# Patient Record
Sex: Male | Born: 1975 | Race: White | Hispanic: No | Marital: Single | Smoking: Former smoker
Health system: Southern US, Community
[De-identification: ages and names within clinical notes are randomized; demographics above are authoritative.]

## PROBLEM LIST (undated history)

## (undated) DIAGNOSIS — G40219 Localization-related (focal) (partial) symptomatic epilepsy and epileptic syndromes with complex partial seizures, intractable, without status epilepticus: Principal | ICD-10-CM

## (undated) DIAGNOSIS — I6200 Nontraumatic subdural hemorrhage, unspecified: Principal | ICD-10-CM

## (undated) DIAGNOSIS — C719 Malignant neoplasm of brain, unspecified: Secondary | ICD-10-CM

## (undated) DIAGNOSIS — R569 Unspecified convulsions: Secondary | ICD-10-CM

## (undated) DIAGNOSIS — G40909 Epilepsy, unspecified, not intractable, without status epilepticus: Secondary | ICD-10-CM

## (undated) HISTORY — PX: CRANIOTOMY: SHX93

## (undated) HISTORY — DX: Malignant neoplasm of brain, unspecified: C71.9

---

## 2006-11-14 DIAGNOSIS — C719 Malignant neoplasm of brain, unspecified: Secondary | ICD-10-CM

## 2006-11-14 HISTORY — DX: Malignant neoplasm of brain, unspecified: C71.9

## 2009-06-17 NOTE — Procedures (Addendum)
[place] [facility]   [address]   [place], [place] [address]      Name: ERYX, ZANE Date: 06/12/2009  MR #: [id number] Sex: Judie Petit  Billing [id number] Age: 33  #:  Ref MD: Location:  DOB: 1976-03-25 Date of Adm: 06/12/2009       ROUTINE ELECTROENCEPHALOGRAM (EEG) REPORT      REQUESTING PHYSICIAN: Dariya Gainer L. Zaryan Yakubov, MD    INDICATIONS: An EEG is requested on this 33 year old man with localization  ________ epilepsy to evaluate for epileptiform abnormalities. His  medications include Keppra.    DESCRIPTION: This tracing is obtained during the awake state. 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    During periods of wakefulness, there are intermittent runs of posteriorly  dominant, symmetrical, low-to-medium amplitude, well-regulated,  10-cycle/second activities, which attenuation with eye opening. Lower  voltage, faster frequency activities are seen symmetrically over the  anterior head regions.    Hyperventilation is performed for 3 minutes with good effort and little  alters the tracing.    Intermittent photic stimulation little alters the tracing.    Sleep is not attained.    INTERPRETATION: This EEG recorded during the awake state is normal. No  epileptiform abnormalities are seen; however, this does not exclude the  possibility of seizures and/or epilepsy.    E-Signed By  Stanford Scotland, MD 08/12/2009 14:37  Stanford Scotland, MD    cc: Stanford Scotland, MD        SLE/WMX; D: 06/16/2009 5:55 P; T: 06/17/2009 11:20 A; DOC# [id number]; JOB#  [id number]

## 2009-06-17 NOTE — Procedures (Signed)
Procedures filed by Stanford Scotland, MD at 08/12/09 1437                Author: Stanford Scotland, MD  Service: --  Author Type: Physician       Filed: 08/12/09 1437  Date of Service: 06/17/09 1120  Status: Addendum          Editor: Stanford Scotland, MD (Physician)            Procedure Orders        1. EEG [[id number]] ordered by  at 06/17/09 1120                         <!--EPICS-->                   [place] [facility]<BR>                         [address]<BR>                             [place], [place] [address]<BR> <BR> <BR> Name:    Carl Howell, Carl Howell                   Date:          06/12/2009<BR> MR #:    [id number]                        Sex:           M<BR> Billing  [id number]                     Age:           33<BR> #:<BR>  Ref MD:                                   Location:<BR> DOB:     1976-10-11                       Date of Adm:   06/12/2009<BR> <BR> <BR>                  ROUTINE ELECTROENCEPHALOGRAM (EEG) REPORT<BR> <BR> <BR> REQUESTING PHYSICIAN:  Riven Beebe L. Mykalah Saari, MD<BR>  <BR> INDICATIONS:  An EEG is requested on this 33 year old man with localization<BR> ________ epilepsy to evaluate for epileptiform abnormalities. His<BR> medications include Keppra.<BR> <BR> DESCRIPTION:  This tracing is obtained during the awake state.  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 Lower<BR> voltage, faster frequency activities are seen symmetrically over the<BR> anterior head regions.<BR> <BR> Hyperventilation is performed for 3 minutes  with good effort and little<BR> alters the tracing.<BR> <BR> Intermittent photic stimulation little alters the tracing.<BR> <BR> Sleep is not  attained.<BR> <BR> INTERPRETATION:  This EEG recorded during the awake state is normal. No<BR> epileptiform abnormalities  are seen; however, this does not exclude the<BR> possibility of seizures and/or epilepsy.<BR> <BR> E-Signed By<BR> Stanford Scotland, MD 08/12/2009 14:37<BR> Britton Perkinson L Graysin Luczynski, MD<BR> <BR> cc:   Madelynn Malson L Brittni Hult, MD<BR> <BR> <BR> <BR>  SLE/WMX; D:  06/16/2009  5:55  P; T:  06/17/2009 11:20 A; DOC# [id number]; JOB#<BR> [id number]<BJ> <!--EPICE-->

## 2010-05-28 MED ORDER — LACOSAMIDE 100 MG TAB
100 mg | ORAL_TABLET | ORAL | Status: DC
Start: 2010-05-28 — End: 2010-08-28

## 2010-05-28 NOTE — Telephone Encounter (Signed)
Patient last here in March returns for f/u in september

## 2010-06-18 MED ORDER — LEVETIRACETAM 500 MG TAB
500 mg | ORAL_TABLET | ORAL | Status: DC
Start: 2010-06-18 — End: 2011-06-13

## 2010-08-30 MED ORDER — LACOSAMIDE 100 MG TAB
100 mg | ORAL_TABLET | ORAL | Status: DC
Start: 2010-08-30 — End: 2011-02-25

## 2011-02-26 MED ORDER — VIMPAT 100 MG TABLET
100 mg | ORAL_TABLET | ORAL | Status: DC
Start: 2011-02-26 — End: 2011-03-04

## 2011-03-01 NOTE — Telephone Encounter (Signed)
Needs his Vimpat 100mg  refilled said he has been trying since last week he is out and had a seizure last night, the pharmacy # is [phone] [facility].Please call in regards and let him know if tis is done.

## 2011-03-01 NOTE — Telephone Encounter (Signed)
Called patient to advise that Rx was faxed this morning. Patient voiced understanding.

## 2011-03-02 NOTE — Telephone Encounter (Signed)
Needs a note for his employer to return back to work not sure if Dr. Jettie Pagan can just write it or does he have to be seen.

## 2011-03-02 NOTE — Telephone Encounter (Signed)
Did you take this patient out of work? Patient last seen March of 2011 and has a f/u in May

## 2011-03-02 NOTE — Telephone Encounter (Signed)
No CC records as seen prior to live date.  Need to review paper chart

## 2011-03-03 NOTE — Telephone Encounter (Signed)
On your desk

## 2011-03-03 NOTE — Telephone Encounter (Signed)
Needs a note written to return to work, left a message on thursday and had not heard back so he wanted to put another one in, please call him.

## 2011-03-03 NOTE — Telephone Encounter (Signed)
Called and scheduled patient for f/u as Dr. Jettie Pagan advised. Patient coming in tomorrow morning to see [place].

## 2011-03-04 MED ORDER — LACOSAMIDE 100 MG TAB
100 mg | ORAL_TABLET | Freq: Two times a day (BID) | ORAL | Status: DC
Start: 2011-03-04 — End: 2011-03-30

## 2011-03-04 NOTE — Progress Notes (Signed)
Date:  March 04, 2011    Name:  [facility] Carl Howell  DOB:  04-04-1976  MRN:  [id number]     PCP:  No primary provider on file.    Chief Complaint   Patient presents with   ??? Follow-up     work excuse letter; had a seizure on monday at work and hit his head; needs to be cleared to go back to work   ??? Other     ran out of vimpat over the weekend and had the seizure that monday; thinks it may have been due to lack of medication     HISTORY OF PRESENT ILLNESS: Carl Howell presents today for follow up of seizures. He had a seizure while at work this week which resulted in a fall and hitting his head. Over the weekend before this occurred, he was not able to get the Vimpat refilled due to authorization issues and ran out.     Current Outpatient Prescriptions   Medication Sig   ??? VIMPAT 100 mg Tab tablet TAKE ONE TABLET BY MOUTH TWICE DAILY   ??? levetiracetam (KEPPRA) 500 mg tablet TAKE 4 TABLETS BY MOUTH EVERY MORNING , AND 3 TABLETS EVERY EVENING     No Known Allergies  Past Medical History   Diagnosis Date   ??? Epilepsy unknown     Past Surgical History   Procedure Date   ??? Kidney dialysate delivry sys      kidney surgery - unknown   ??? Brain avm surg,dural,complx      brain surgery - unknown had brain tumor     History     Social History   ??? Marital Status: Married     Spouse Name: N/A     Number of Children: N/A   ??? Years of Education: N/A     Occupational History   ??? Not on file.     Social History Main Topics   ??? Smoking status: Current Everyday Smoker -- 0.5 packs/day     Types: Cigarettes   ??? Smokeless tobacco: Never Used   ??? Alcohol Use: No   ??? Drug Use: Not on file   ??? Sexually Active: Not on file     Other Topics Concern   ??? Not on file     Social History Narrative   ??? No narrative on file     No family history on file.    PHYSICAL EXAMINATION:    Visit Vitals   Item Reading   ??? BP 132/80   ??? Pulse 81   ??? Temp 98.4 ??F (36.9 ??C)   ??? Ht 5\' 8"  (1.727 m)   ??? Wt 142 lb 3.2 oz (64.501 kg)   ??? BMI 21.62 kg/m2   ??? SpO2 99%      General:  Well defined, nourished, and groomed individual in no acute distress.    Neck: Supple, nontender, no bruits, no pain with resistance to active range of motion.    Heart: Regular rate and rhythm, no murmurs, rub, or gallop.  Normal S1S2.  Lungs:  Clear to auscultation bilaterally with equal chest expansion, no cough, no wheeze  Musculoskeletal:  Extremities revealed no edema and had full range of motion of joints.    Psych:  Good mood and bright affect    NEUROLOGICAL EXAMINATION:     Mental Status:   Alert and oriented to person, place, and time with recent and remote memory intact.  Attention span and concentration are normal. Speech is  fluent with a full fund of knowledge.      Cranial Nerves:    II, III, IV, VI:  Visual acuity grossly intact. Visual fields are normal.    Pupils are equal, round, and reactive to light and accommodation.    Extra-ocular movements are full and fluid.  Fundoscopic exam was benign, no ptosis or nystagmus.   V-XII: Hearing is grossly intact.  Facial features are symmetric, with normal sensation and strength.  The palate rises symmetrically and the tongue protrudes midline.  Sternocleidomastoids 5/5.      Motor Examination: Normal tone, bulk, and strength, 5/5 muscle strength throughout.     Coordination:  No resting or intention tremor    Gait and Station:  Steady while walking.  Normal arm swing.  No pronator drift.   No muscle wasting or fasiculations noted.      Reflexes:  DTRs 2+ throughout.        ASSESSMENT AND PLAN  1. Epilepsy  lacosamide (VIMPAT) 100 mg Tab tablet      Carl Howell appears to be stable despite this most recent hiccup which seems most likely related to running out of his medication. As long as he is compliant with therapy and does not run out of medication again, I do not feel that he is at high risk for additional seizures. He was provided with a note releasing him back to work. He was provided with prescription refills. Follow up in one year or sooner if needed.    Carl Howell, ACNP

## 2011-03-30 ENCOUNTER — Encounter

## 2011-03-30 MED ORDER — LACOSAMIDE 100 MG TAB
100 mg | ORAL_TABLET | Freq: Two times a day (BID) | ORAL | Status: DC
Start: 2011-03-30 — End: 2012-12-06

## 2011-05-03 MED ORDER — LACOSAMIDE 50 MG TAB
50 mg | ORAL_TABLET | ORAL | Status: DC
Start: 2011-05-03 — End: 2012-12-06

## 2011-05-03 NOTE — Telephone Encounter (Signed)
Dr. Jettie Pagan put him on vimpat and he has been on it a little over a year and a half now. He does not have insurance and was told by Diannia Ruder that he can pick up samples as needed. I will leave him some samples up front as he has no insurance and is waiting on his new coverage

## 2011-05-26 NOTE — Telephone Encounter (Signed)
Placed call to patient to advise, per Dr. Jettie Pagan that samples are not available at this time, but we may provide with a [employer] patient assistance form for him to fill out and submit to [employer] along with requested information. Left message on voice mail informing that he may pick up form tomorrow.

## 2011-05-26 NOTE — Telephone Encounter (Signed)
May give but have him complete pt assistance from [employer] as we do not have samples in excess to continue to supply forever

## 2011-05-26 NOTE — Telephone Encounter (Signed)
Pt called requesting samples of vimpat. Please call [phone]

## 2011-06-13 MED ORDER — LEVETIRACETAM 500 MG TAB
500 mg | ORAL_TABLET | ORAL | Status: DC
Start: 2011-06-13 — End: 2012-02-14

## 2011-06-13 NOTE — Telephone Encounter (Signed)
Patient called to find out the status of his refill on Keppra 500 mg.  Patient advised that he is totally out of this medication and he called [facility] this morning and asked that they fax a refill request.  Please give him a call back @ [phone] if you have any questions.  Thank you!

## 2012-02-14 MED ORDER — LEVETIRACETAM 500 MG TAB
500 mg | ORAL_TABLET | ORAL | Status: DC
Start: 2012-02-14 — End: 2012-05-29

## 2012-02-14 NOTE — Telephone Encounter (Signed)
Patient last seen 02/2011 by NP. No upcoming appointments.

## 2012-05-29 MED ORDER — LEVETIRACETAM 500 MG TAB
500 mg | ORAL_TABLET | ORAL | Status: DC
Start: 2012-05-29 — End: 2012-11-15

## 2012-11-15 MED ORDER — LEVETIRACETAM 500 MG TAB
500 mg | ORAL_TABLET | ORAL | Status: DC
Start: 2012-11-15 — End: 2013-01-07

## 2012-11-15 NOTE — Telephone Encounter (Signed)
Last seen 03/04/11. Next apt 12/06/12

## 2012-12-06 LAB — METABOLIC PANEL, COMPREHENSIVE
A-G Ratio: 1.2 (ref 1.1–2.2)
ALT (SGPT): 20 U/L (ref 12–78)
AST (SGOT): 10 U/L — ABNORMAL LOW (ref 15–37)
Albumin: 4.2 g/dL (ref 3.5–5.0)
Alk. phosphatase: 89 U/L (ref 50–136)
Anion gap: 11 mmol/L (ref 5–15)
BUN/Creatinine ratio: 13 (ref 12–20)
BUN: 9 MG/DL (ref 6–20)
Bilirubin, total: 0.4 MG/DL (ref 0.2–1.0)
CO2: 27 MMOL/L (ref 21–32)
Calcium: 9 MG/DL (ref 8.5–10.1)
Chloride: 103 MMOL/L (ref 97–108)
Creatinine: 0.71 MG/DL (ref 0.45–1.15)
GFR est AA: >60 mL/min/{1.73_m2} (ref >60)
GFR est non-AA: >60 mL/min/{1.73_m2} (ref >60)
Globulin: 3.6 g/dL (ref 2.0–4.0)
Glucose: 82 MG/DL (ref 65–100)
Potassium: 3.5 MMOL/L (ref 3.5–5.1)
Protein, total: 7.8 g/dL (ref 6.4–8.2)
Sodium: 141 MMOL/L (ref 136–145)

## 2012-12-06 LAB — CBC WITH AUTOMATED DIFF
ABS. BASOPHILS: 0 10*3/uL (ref 0.0–0.1)
ABS. EOSINOPHILS: 0.1 10*3/uL (ref 0.0–0.4)
ABS. LYMPHOCYTES: 1.9 10*3/uL
ABS. MONOCYTES: 0.6 10*3/uL (ref 0.0–1.0)
ABS. NEUTROPHILS: 7.6 10*3/uL (ref 1.8–8.0)
BASOPHILS: 0 % (ref 0–1)
EOSINOPHILS: 1 % (ref 0–7)
HCT: 40 % (ref 36.6–50.3)
HGB: 13.9 g/dL (ref 12.1–17.0)
LYMPHOCYTES: 18 % (ref 12–49)
MCH: 32.1 PG (ref 26.0–34.0)
MCHC: 34.8 g/dL (ref 30.0–36.5)
MCV: 92.4 FL (ref 80.0–99.0)
MONOCYTES: 6 % (ref 5–13)
NEUTROPHILS: 75 % (ref 32–75)
PLATELET: 302 10*3/uL (ref 150–400)
RBC: 4.33 M/uL (ref 4.10–5.70)
RDW: 11.8 % (ref 11.5–14.5)
WBC: 10.1 10*3/uL (ref 4.1–11.1)

## 2012-12-06 LAB — GLUCOSE, POC: Glucose (POC): 88 mg/dL (ref 75–110)

## 2012-12-06 MED ORDER — SODIUM CHLORIDE 0.9% BOLUS IV
0.9 % | Freq: Once | INTRAVENOUS | Status: AC
Start: 2012-12-06 — End: 2012-12-06
  Administered 2012-12-06: 18:00:00 via INTRAVENOUS

## 2012-12-06 MED ORDER — LACOSAMIDE 100 MG TAB
100 mg | ORAL_TABLET | Freq: Two times a day (BID) | ORAL | Status: DC
Start: 2012-12-06 — End: 2013-08-05

## 2012-12-06 MED ORDER — LACOSAMIDE 100 MG TAB
100 mg | ORAL | Status: DC
Start: 2012-12-06 — End: 2012-12-06

## 2012-12-06 MED FILL — SODIUM CHLORIDE 0.9 % IV: INTRAVENOUS | Qty: 1000

## 2012-12-06 NOTE — ED Notes (Signed)
Patient states he started to feel lightheaded and this is how he normally feels before he has a seizure. Patient has a history of seizures from a brain tumor in the past. Patient's friend that brought him in states the patient "froze up in the car and he would not move or talk for about a minute minute and a half." Patient was nauseous but states "I feel fine now."

## 2012-12-06 NOTE — ED Notes (Signed)
Vimpat not stocked in our pyxis. MD aware. Will give patient a prescription to get filled.

## 2012-12-06 NOTE — ED Provider Notes (Signed)
HPI Comments: 37 year old white male with prior history of seizure disorder, benign brain tumor with resection in 1998 at [facility] presents with a coworker concerned that he was about to have a seizure. Apparently he was in the car and a coworker noticed him to have a blank stare and poorly responding to. The patient was aware of this and states he felt some nausea and lightheadedness as if he was going to have a full blown seizure. The symptoms last less than a minute but he was brought to the ER. The patient states he is compliant with his medications. He denies any current headache nausea vomiting. He does state he has had extra stress and in fact was up late the last 2 nights. He admits to social alcohol use but denies any tobacco use or illicit drug use. His last seizure was in March and there's been no change in his Keppra. He denies any over-the-counter medications.    Patient is a 37 y.o. male presenting with seizures and dizziness.   Seizure   Pertinent negatives include no headaches, no speech difficulty, no chest pain and no cough.   He reports no chest pain, no headaches, no speech difficulty and no cough.   Dizziness  Pertinent negatives include no speech difficulty. Pertinent negatives include no chest pain and no headaches. Associated medical issues include seizures.        Past Medical History   Diagnosis Date   ??? Epilepsy unknown        Past Surgical History   Procedure Laterality Date   ??? Kidney dialysate delivry sys       kidney surgery - unknown   ??? Pr brain avm surg,dural,complx       brain surgery - unknown had brain tumor         History reviewed. No pertinent family history.     History     Social History   ??? Marital Status: MARRIED     Spouse Name: N/A     Number of Children: N/A   ??? Years of Education: N/A     Occupational History   ??? Not on file.     Social History Main Topics   ??? Smoking status: Current Every Day Smoker -- 0.50 packs/day     Types: Cigarettes   ??? Smokeless tobacco:  Never Used   ??? Alcohol Use: No   ??? Drug Use: Not on file   ??? Sexually Active: Not on file     Other Topics Concern   ??? Not on file     Social History Narrative   ??? No narrative on file                  ALLERGIES: Review of patient's allergies indicates no known allergies.      Review of Systems   Constitutional: Negative for fever and chills.   HENT: Negative for neck pain and neck stiffness.    Eyes: Negative for pain.   Respiratory: Negative for cough and chest tightness.    Cardiovascular: Negative for chest pain and leg swelling.   Genitourinary: Negative for flank pain.   Neurological: Positive for dizziness. Negative for seizures, speech difficulty, weakness and headaches.   All other systems reviewed and are negative.        Filed Vitals:    12/06/12 1303 12/06/12 1304   BP: 136/77    Pulse: 117    Temp: 97.7 ??F (36.5 ??C)    Resp: 18  Height: 5\' 8"  (1.727 m) 5\' 8"  (1.727 m)   Weight: 68.04 kg (150 lb) 68.04 kg (150 lb)   SpO2: 99%             Physical Exam   Nursing note and vitals reviewed.  Constitutional: He is oriented to person, place, and time. He appears well-developed and well-nourished. No distress.   HENT:   Head: Normocephalic and atraumatic.   Eyes: Conjunctivae and EOM are normal. Pupils are equal, round, and reactive to light.   Neck: Normal range of motion. Neck supple.   Cardiovascular: Normal rate, regular rhythm, normal heart sounds and intact distal pulses.  Exam reveals no gallop and no friction rub.    No murmur heard.  Pulmonary/Chest: Effort normal and breath sounds normal. No respiratory distress. He has no wheezes. He has no rales. He exhibits no tenderness.   Abdominal: Soft. Bowel sounds are normal. He exhibits no distension and no mass. There is no tenderness. There is no rebound and no guarding.   Musculoskeletal: Normal range of motion. He exhibits no edema and no tenderness.   Neurological: He is alert and oriented to person, place, and time. He has normal strength and normal  reflexes. He displays no tremor and normal reflexes. No cranial nerve deficit. He exhibits normal muscle tone. He displays no seizure activity. Coordination and gait normal. GCS eye subscore is 4. GCS verbal subscore is 5. GCS motor subscore is 6.   Skin: Skin is warm. No rash noted. No erythema. No pallor.        MDM     Differential Diagnosis; Clinical Impression; Plan:     Differential diagnosis-soft therapeutic medications, Primary seizure, malignancySleep deprivation. Clinical impression/plan-the patient presents after having a near seizure event. He is supposed to be on Keppra as well as VIMPAT. He stopped taking the VIMPAT several months ago as his neurologist did not refill it he thought this then he should not take it any longer. In ER he is otherwise nonfocal. His case was discussed with his neurologist he states he should be on that medication and we will restart that. He will followup with his neurologist in 24-48 hours.  Amount and/or Complexity of Data Reviewed:   Clinical lab tests:  Ordered and reviewed  Tests in the medicine section of the CPT??:  Ordered and reviewed  Discussion of test results with the performing providers:  No   Decide to obtain previous medical records or to obtain history from someone other than the patient:  Yes   Obtain history from someone other than the patient:  Yes   Review and summarize past medical records:  Yes   Discuss the patient with another provider:  Yes   Independant visualization of image, tracing, or specimen:  Yes  Risk of Significant Complications, Morbidity, and/or Mortality:   Presenting problems:  Moderate  Diagnostic procedures:  Low  Management options:  Low  Progress:   Patient progress:  Stable and improved      Procedures    2:24 PM Progress note-patient is feeling well desires to go. Discussed this case with Dr. Jettie Pagan his neurologist.There must have been miscommunication regarding his Vimpat as the patient thought he was not supposed to be taking it  any longer as it was not refilled with the neurologist states he should be on it. He wants him to stay with her same dose of Keppra and restart the Vimpat 100mg  BID.

## 2012-12-06 NOTE — ED Notes (Signed)
Seizure pads placed on patient's stretcher.

## 2012-12-06 NOTE — ED Notes (Signed)
Discharge instructions and prescriptions given to patient by MD. Patient verbalized understanding and has no further questions at this time. Patient stable at time of discharge. IV removed. Patient's vital signs stable. Offered wheelchair to discharge patient but patient denies at this time. Patient ambulatory to ED lobby to go home with friend who is picking him up.

## 2012-12-07 LAB — EKG, 12 LEAD, INITIAL
Atrial Rate: 106 {beats}/min
Calculated P Axis: 82 degrees
Calculated R Axis: 80 degrees
Calculated T Axis: 76 degrees
P-R Interval: 186 ms
Q-T Interval: 338 ms
QRS Duration: 96 ms
QTC Calculation (Bezet): 448 ms
Ventricular Rate: 106 {beats}/min

## 2012-12-07 NOTE — Telephone Encounter (Signed)
Per Dr Jettie Pagan, have patient see Leonette Monarch, NP. Called patient and left message with appointment for today at [facility] @ 11:30am.

## 2012-12-08 LAB — LEVETIRACETAM (KEPPRA): Levetiracetam (Keppra): 23.8 ug/mL (ref 5.0–63.0)

## 2013-01-07 NOTE — Telephone Encounter (Signed)
Last seen 02/2011.

## 2013-01-08 MED ORDER — LEVETIRACETAM 500 MG TAB
500 mg | ORAL_TABLET | ORAL | Status: DC
Start: 2013-01-08 — End: 2013-02-19

## 2013-01-08 NOTE — Telephone Encounter (Signed)
Need annual office visit. Will not refill until seen in office.

## 2013-02-19 MED ORDER — LEVETIRACETAM 500 MG TAB
500 mg | ORAL_TABLET | ORAL | Status: DC
Start: 2013-02-19 — End: 2013-03-29

## 2013-03-29 MED ORDER — LEVETIRACETAM 500 MG TAB
500 mg | ORAL_TABLET | ORAL | Status: DC
Start: 2013-03-29 — End: 2013-05-09

## 2013-05-10 MED ORDER — LEVETIRACETAM 500 MG TAB
500 mg | ORAL_TABLET | ORAL | Status: DC
Start: 2013-05-10 — End: 2013-06-19

## 2013-06-19 MED ORDER — LEVETIRACETAM 500 MG TAB
500 mg | ORAL_TABLET | ORAL | Status: DC
Start: 2013-06-19 — End: 2013-07-31

## 2013-07-31 MED ORDER — LEVETIRACETAM 500 MG TAB
500 mg | ORAL_TABLET | ORAL | Status: DC
Start: 2013-07-31 — End: 2013-08-05

## 2013-08-02 NOTE — Progress Notes (Signed)
Not seen in 2 years

## 2013-08-05 MED ORDER — LEVETIRACETAM 500 MG TAB
500 mg | ORAL_TABLET | ORAL | Status: DC
Start: 2013-08-05 — End: 2016-08-02

## 2013-08-05 MED ORDER — LACOSAMIDE 100 MG TAB
100 mg | ORAL_TABLET | Freq: Two times a day (BID) | ORAL | Status: DC
Start: 2013-08-05 — End: 2016-08-02

## 2013-08-05 NOTE — Progress Notes (Signed)
Date:  August 05, 2013    Name:  Carl Howell  DOB:  Apr 03, 1976  MRN:  [id number]     PCP:  No primary provider on file.    Chief Complaint   Patient presents with   ??? 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 Had a near miss in January that landed him in the ER. Taken there by a friend and he felt as though he might have a seizure. No actual seizure activity. He had run out of the Vimpat and thought that because the refill request was denied that he no longer needed it. While in the ED, the Vimpat was restarted. He continues to take Keppra 1500/2000mg . No new complaints. Still has some intermittent left lower extremity muscle spasm but nothing that seems to cause any balance or functional issues. No new medical or surgical history.     Review of Systems - History obtained from the patient  General ROS: negative  Psychological ROS: negative  ENT ROS: negative  Hematological and Lymphatic ROS: negative  Endocrine ROS: negative  Respiratory ROS: no cough, shortness of breath, or wheezing  Cardiovascular ROS: no chest pain or dyspnea on exertion  Gastrointestinal ROS: no abdominal pain, change in bowel habits, or black or bloody stools  Genito-Urinary ROS: no dysuria, trouble voiding, or hematuria  Musculoskeletal ROS: negative  Neurological ROS: no TIA or stroke symptoms  negative for - seizures  Dermatological ROS: negative     Current Outpatient Prescriptions   Medication Sig   ??? levetiracetam (KEPPRA) 500 mg tablet TAKE 4 TABLETS BY MOUTH IN THE MORNING AND 3 TABLETS IN THE EVENING. **MUST SEE MD FOR REFILLS**   ??? lacosamide (VIMPAT) 100 mg tab tablet Take 1 Tab by mouth two (2) times a day.     No current facility-administered medications for this visit.     No Known Allergies  Past Medical History   Diagnosis Date   ??? Epilepsy unknown     Past Surgical History   Procedure  Laterality Date   ??? Kidney dialysate delivry sys       kidney surgery - unknown   ??? Pr brain avm surg,dural,complx       brain surgery - unknown had brain tumor     History     Social History   ??? Marital Status: MARRIED     Spouse Name: N/A     Number of Children: N/A   ??? Years of Education: N/A     Occupational History   ??? Not on file.     Social History Main Topics   ??? Smoking status: Current Every Day Smoker -- 0.50 packs/day     Types: Cigarettes   ??? Smokeless tobacco: Never Used   ??? Alcohol Use: No   ??? Drug Use: Not on file   ??? Sexually Active: Not on file     Other Topics Concern   ??? Not on file     Social History Narrative   ??? No narrative on file     History reviewed. No pertinent family history.    PHYSICAL EXAMINATION:    Visit Vitals   Item Reading   ??? BP 116/72   ??? Pulse 92   ??? Ht 5\' 8"  (1.727 m)   ??? Wt 71.668 kg (158 lb)   ??? BMI 24.03 kg/m2   ???  SpO2 97%     General:  Well defined, nourished, and groomed individual in no acute distress.    Neck: Supple, nontender, no bruits, no pain with resistance to active range of motion.    Heart: Regular rate and rhythm, no murmurs, rub, or gallop.  Normal S1S2.  Lungs:  Clear to auscultation bilaterally with equal chest expansion, no cough, no wheeze  Musculoskeletal:  Extremities revealed no edema and had full range of motion of joints.    Psych:  Good mood and bright affect    NEUROLOGICAL EXAMINATION:     Mental Status:   Alert and oriented to person, place, and time   Cranial Nerves:    II, III, IV, VI:  Visual acuity grossly intact. Visual fields are normal.    Pupils are equal, round, and reactive to light and accommodation.    Extra-ocular movements are full and fluid.  Fundoscopic exam was benign, no ptosis or nystagmus.   V-XII: Hearing is grossly intact.  Facial features are symmetric, with normal sensation and strength.  The palate rises symmetrically and the tongue protrudes midline.  Sternocleidomastoids 5/5.    Motor Examination: Normal tone, bulk,  and strength, 5/5 muscle strength throughout.    Coordination:  Finger to nose was normal.   No resting or intention tremor  Gait and Station:  Steady while walking.  Normal arm swing.  No pronator drift.   No muscle wasting or fasiculations noted.    Reflexes:  DTRs 2+ throughout.     ASSESSMENT AND PLAN    ICD-9-CM    1. Epilepsy 345.90 levetiracetam (KEPPRA) 500 mg tablet     lacosamide (VIMPAT) 100 mg tab tablet     No new neurological symptoms to suggest any new brain pathology. Seizures are well controlled on current therapy. No new complaints. Continue present medications, Keppra same generic for each refill and Vimpat as previously prescribed. Discussed refills were denied as a result of not being seen in almost two years rather than no longer needing medication. Stressed importance of yearly follow up. DMV forms filled out.     Carl Howell, ACNP

## 2013-08-05 NOTE — Progress Notes (Signed)
Patient present today in follow up for epilepsy. Went to [facility] in January for evaluation after almost losing consciousness. No other episodes since then.

## 2013-09-11 NOTE — Telephone Encounter (Signed)
I was able to help the caller, no message taken.

## 2014-05-09 LAB — CBC WITH AUTOMATED DIFF
ABS. BASOPHILS: 0 10*3/uL (ref 0.0–0.1)
ABS. EOSINOPHILS: 0 10*3/uL (ref 0.0–0.4)
ABS. LYMPHOCYTES: 1.6 10*3/uL (ref 0.8–3.5)
ABS. MONOCYTES: 0.8 10*3/uL (ref 0.0–1.0)
ABS. NEUTROPHILS: 13.4 10*3/uL — ABNORMAL HIGH (ref 1.8–8.0)
BASOPHILS: 0 % (ref 0–1)
EOSINOPHILS: 0 % (ref 0–7)
HCT: 38.8 % (ref 36.6–50.3)
HGB: 13.2 g/dL (ref 12.1–17.0)
LYMPHOCYTES: 10 % — ABNORMAL LOW (ref 12–49)
MCH: 30.9 PG (ref 26.0–34.0)
MCHC: 34 g/dL (ref 30.0–36.5)
MCV: 90.9 FL (ref 80.0–99.0)
MONOCYTES: 5 % (ref 5–13)
NEUTROPHILS: 85 % — ABNORMAL HIGH (ref 32–75)
PLATELET: 280 10*3/uL (ref 150–400)
RBC: 4.27 M/uL (ref 4.10–5.70)
RDW: 12.4 % (ref 11.5–14.5)
WBC: 15.8 10*3/uL — ABNORMAL HIGH (ref 4.1–11.1)

## 2014-05-09 LAB — METABOLIC PANEL, COMPREHENSIVE
A-G Ratio: 1.3 (ref 1.1–2.2)
ALT (SGPT): 22 U/L (ref 12–78)
AST (SGOT): 12 U/L — ABNORMAL LOW (ref 15–37)
Albumin: 4.5 g/dL (ref 3.5–5.0)
Alk. phosphatase: 72 U/L (ref 45–117)
Anion gap: 12 mmol/L (ref 5–15)
BUN/Creatinine ratio: 10 — ABNORMAL LOW (ref 12–20)
BUN: 8 MG/DL (ref 6–20)
Bilirubin, total: 0.7 MG/DL (ref 0.2–1.0)
CO2: 24 mmol/L (ref 21–32)
Calcium: 9 MG/DL (ref 8.5–10.1)
Chloride: 105 mmol/L (ref 97–108)
Creatinine: 0.84 MG/DL (ref 0.45–1.15)
GFR est AA: >60 mL/min/{1.73_m2} (ref >60)
GFR est non-AA: >60 mL/min/{1.73_m2} (ref >60)
Globulin: 3.4 g/dL (ref 2.0–4.0)
Glucose: 119 mg/dL — ABNORMAL HIGH (ref 65–100)
Potassium: 3 mmol/L — ABNORMAL LOW (ref 3.5–5.1)
Protein, total: 7.9 g/dL (ref 6.4–8.2)
Sodium: 141 mmol/L (ref 136–145)

## 2014-05-09 LAB — URINALYSIS W/ RFLX MICROSCOPIC
Bacteria: NEGATIVE /hpf
Bilirubin: NEGATIVE
Blood: NEGATIVE
Glucose: NEGATIVE mg/dL
Ketone: NEGATIVE mg/dL
Leukocyte Esterase: NEGATIVE
Nitrites: NEGATIVE
Protein: 30 mg/dL — AB
Specific gravity: 1.02 (ref 1.003–1.030)
Urobilinogen: 0.2 EU/dL (ref 0.2–1.0)
pH (UA): 8.5 — ABNORMAL HIGH (ref 5.0–8.0)

## 2014-05-09 LAB — MAGNESIUM: Magnesium: 1.9 mg/dL (ref 1.6–2.4)

## 2014-05-09 LAB — LIPASE: Lipase: 91 U/L (ref 73–393)

## 2014-05-09 MED ORDER — ONDANSETRON (PF) 4 MG/2 ML INJECTION
4 mg/2 mL | INTRAMUSCULAR | Status: AC
Start: 2014-05-09 — End: 2014-05-09
  Administered 2014-05-09: 18:00:00 via INTRAVENOUS

## 2014-05-09 MED ORDER — ONDANSETRON 4 MG TAB, RAPID DISSOLVE
4 mg | ORAL_TABLET | Freq: Three times a day (TID) | ORAL | Status: DC | PRN
Start: 2014-05-09 — End: 2015-04-16

## 2014-05-09 MED ORDER — POTASSIUM CHLORIDE SR 10 MEQ TAB
10 mEq | ORAL | Status: AC
Start: 2014-05-09 — End: 2014-05-09
  Administered 2014-05-09: 19:00:00 via ORAL

## 2014-05-09 MED ORDER — SODIUM CHLORIDE 0.9% BOLUS IV
0.9 % | INTRAVENOUS | Status: AC
Start: 2014-05-09 — End: 2014-05-09
  Administered 2014-05-09: 18:00:00 via INTRAVENOUS

## 2014-05-09 MED FILL — SODIUM CHLORIDE 0.9 % IV: INTRAVENOUS | Qty: 1000

## 2014-05-09 MED FILL — K-TAB 10 MEQ TABLET,EXTENDED RELEASE: 10 mEq | ORAL | Qty: 4

## 2014-05-09 MED FILL — ONDANSETRON (PF) 4 MG/2 ML INJECTION: 4 mg/2 mL | INTRAMUSCULAR | Qty: 2

## 2014-05-09 NOTE — ED Notes (Signed)
Dr. Ashwell gave and reviewed discharge instructions with the patient.  The patient verbalized understanding.  The patient was given opportunity for questions.  Patient discharged in stable condition to the waiting room ambulatory with male visitor.

## 2014-05-09 NOTE — ED Provider Notes (Signed)
HPI Comments: 38 yo male presents ambulatory to ED for evaluation of minimal diffuse abd pain, nausea, vomiting, and diarrhea x this morning. Pt cites 11 episodes of vomiting and states he cannot tolerate liquids. Pt reports he very rarely drinks alcohol and cites consuming 4 beers x last night. Pt notes hx of brain CA with recent surgery but denies currently receiving chemo or radiation. Pt denies recent travel or exposure to sick persons. Pt denies blood in stool or vomit, dysuria, headache, testicle pain, fever, or cough.    PMHx: epilepsy  PSHx: brain surgery  Social Hx: +tobacco, +EtOH (very rare)     There are no other complaints, changes or physical findings at this time.   Written by Dannielle Huh, ED Scribe, as dictated by Ocie Cornfield, MD.     The history is provided by the patient.        Past Medical History   Diagnosis Date   ??? Epilepsy ([facility]) unknown        Past Surgical History   Procedure Laterality Date   ??? Kidney dialysate delivry sys       kidney surgery - unknown   ??? Pr brain avm surg,dural,complx       brain surgery - unknown had brain tumor         No family history on file.     History     Social History   ??? Marital Status: MARRIED     Spouse Name: N/A     Number of Children: N/A   ??? Years of Education: N/A     Occupational History   ??? Not on file.     Social History Main Topics   ??? Smoking status: Current Every Day Smoker -- 0.50 packs/day     Types: Cigarettes   ??? Smokeless tobacco: Never Used   ??? Alcohol Use: No   ??? Drug Use: Not on file   ??? Sexual Activity: Not on file     Other Topics Concern   ??? Not on file     Social History Narrative   ??? No narrative on file                  ALLERGIES: Review of patient's allergies indicates no known allergies.      Review of Systems   Constitutional: Negative for fever, chills and fatigue.   HENT: Negative for congestion, rhinorrhea and sore throat.    Eyes: Negative for pain, discharge and visual disturbance.    Respiratory: Negative for cough, chest tightness, shortness of breath and wheezing.    Cardiovascular: Negative for chest pain, palpitations and leg swelling.   Gastrointestinal: Positive for nausea, vomiting, abdominal pain and diarrhea. Negative for constipation.   Genitourinary: Negative for dysuria, frequency and hematuria.   Musculoskeletal: Negative for myalgias, back pain and arthralgias.   Skin: Negative for rash.   Neurological: Negative for dizziness, weakness, light-headedness and headaches.   Psychiatric/Behavioral: Negative.    All other systems reviewed and are negative.      Filed Vitals:    05/09/14 1258   BP: 158/96   Pulse: 71   Temp: 97.9 ??F (36.6 ??C)   Resp: 18   Height: 5' 8"  (1.727 m)   Weight: 64.7 kg (142 lb 10.2 oz)   SpO2: 100%            Physical Exam   Constitutional: He is oriented to person, place, and time. He appears well-developed and well-nourished. No distress.  HENT:   Head: Normocephalic and atraumatic.   Eyes: EOM are normal. Right eye exhibits no discharge. Left eye exhibits no discharge. No scleral icterus.   Neck: Normal range of motion. Neck supple. No tracheal deviation present.   Cardiovascular: Normal rate, regular rhythm, normal heart sounds and intact distal pulses.  Exam reveals no gallop and no friction rub.    No murmur heard.  Pulmonary/Chest: Effort normal and breath sounds normal. No respiratory distress. He has no wheezes. He has no rales.   Abdominal: Soft. He exhibits no distension. There is tenderness (mild, diffuse). There is no rebound and no guarding.   Musculoskeletal: Normal range of motion. He exhibits no edema.   Lymphadenopathy:     He has no cervical adenopathy.   Neurological: He is alert and oriented to person, place, and time.   Skin: Skin is warm and dry. No rash noted.   Well healed incision to scalp   Psychiatric: He has a normal mood and affect.   Written by Dannielle Huh, ED Scribe, as dictated by Ocie Cornfield, MD.     MDM   Number of Diagnoses or Management Options  Gastroenteritis, acute:   Diagnosis management comments:     Patient appears well on exam, nontoxic.  Differential includes dehydration, viral syndrome, gastroenteritis, UTI, electrolyte abnormality.  Abdominal exam relatively benign - doubt acute intraabdominal process.  - CBC, CMP, lipase  - UA  - Symptomatic management and reevaluate        Procedures    PROGRESS NOTE:  3:15 PM  Patient reports he is feeling much better and would like to go home.  Abdominal exam is benign, NTTP.  Tolerating po.  Leukocytosis noted on labs but work up otherwise unremarkable.  Will discharge with PCP follow up.  Discussed results, prescriptions and follow up plan with patient.  Provided customary return to ED instructions.  Patient expressed understanding.  Rip Harbour, MD     LABORATORY TESTS:  Recent Results (from the past 12 hour(s))   CBC WITH AUTOMATED DIFF    Collection Time: 05/09/14  1:42 PM   Result Value Ref Range    WBC 15.8 (H) 4.1 - 11.1 K/uL    RBC 4.27 4.10 - 5.70 M/uL    HGB 13.2 12.1 - 17.0 g/dL    HCT 38.8 36.6 - 50.3 %    MCV 90.9 80.0 - 99.0 FL    MCH 30.9 26.0 - 34.0 PG    MCHC 34.0 30.0 - 36.5 g/dL    RDW 12.4 11.5 - 14.5 %    PLATELET 280 150 - 400 K/uL    NEUTROPHILS 85 (H) 32 - 75 %    LYMPHOCYTES 10 (L) 12 - 49 %    MONOCYTES 5 5 - 13 %    EOSINOPHILS 0 0 - 7 %    BASOPHILS 0 0 - 1 %    ABS. NEUTROPHILS 13.4 (H) 1.8 - 8.0 K/UL    ABS. LYMPHOCYTES 1.6 0.8 - 3.5 K/UL    ABS. MONOCYTES 0.8 0.0 - 1.0 K/UL    ABS. EOSINOPHILS 0.0 0.0 - 0.4 K/UL    ABS. BASOPHILS 0.0 0.0 - 0.1 K/UL   METABOLIC PANEL, COMPREHENSIVE    Collection Time: 05/09/14  1:42 PM   Result Value Ref Range    Sodium 141 136 - 145 mmol/L    Potassium 3.0 (L) 3.5 - 5.1 mmol/L    Chloride 105 97 - 108 mmol/L    CO2 24  21 - 32 mmol/L    Anion gap 12 5 - 15 mmol/L    Glucose 119 (H) 65 - 100 mg/dL    BUN 8 6 - 20 MG/DL    Creatinine 0.84 0.45 - 1.15 MG/DL    BUN/Creatinine ratio 10 (L) 12 - 20       GFR est AA >60 >60 ml/min/1.9m    GFR est non-AA >60 >60 ml/min/1.718m   Calcium 9.0 8.5 - 10.1 MG/DL    Bilirubin, total 0.7 0.2 - 1.0 MG/DL    ALT 22 12 - 78 U/L    AST 12 (L) 15 - 37 U/L    Alk. phosphatase 72 45 - 117 U/L    Protein, total 7.9 6.4 - 8.2 g/dL    Albumin 4.5 3.5 - 5.0 g/dL    Globulin 3.4 2.0 - 4.0 g/dL    A-G Ratio 1.3 1.1 - 2.2     LIPASE    Collection Time: 05/09/14  1:42 PM   Result Value Ref Range    Lipase 91 73 - 393 U/L   MAGNESIUM    Collection Time: 05/09/14  1:42 PM   Result Value Ref Range    Magnesium 1.9 1.6 - 2.4 mg/dL   URINALYSIS W/ RFLX MICROSCOPIC    Collection Time: 05/09/14  2:48 PM   Result Value Ref Range    Color YELLOW/STRAW      Appearance CLEAR CLEAR      Specific gravity 1.020 1.003 - 1.030      pH (UA) 8.5 (H) 5.0 - 8.0      Protein 30 (A) NEG mg/dL    Glucose NEGATIVE  NEG mg/dL    Ketone NEGATIVE  NEG mg/dL    Bilirubin NEGATIVE  NEG      Blood NEGATIVE  NEG      Urobilinogen 0.2 0.2 - 1.0 EU/dL    Nitrites NEGATIVE  NEG      Leukocyte Esterase NEGATIVE  NEG      WBC 0-4 0 - 4 /hpf    RBC 0-5 0 - 5 /hpf    Epithelial cells FEW FEW /lpf    Bacteria NEGATIVE  NEG /hpf         MEDICATIONS GIVEN:  Medications   sodium chloride 0.9 % bolus infusion 1,000 mL (0 mL IntraVENous IV Completed 05/09/14 1447)   ondansetron (ZOFRAN) injection 4 mg (4 mg IntraVENous Given 05/09/14 1347)   potassium chloride SR (KLOR-CON 10) tablet 40 mEq (40 mEq Oral Given 05/09/14 1454)       IMPRESSION:  1. Gastroenteritis, acute        PLAN:  1. rx'd zofran  2. F/u with PCP  Return to ED if worse       3:21 PM   Pt has been reexamined. Pt has no new complaints, changes or physical findings. Care plan outlined and precautions discussed. All available results were reviewed with pt. All medications were reviewed with pt.  All of pt's questions and concerns were addressed. Pt agrees to F/U as instructed with PCP and agrees to return to ED upon further deterioration. Pt is ready to go home.   Written by ChDannielle HuhED Scribe, as dictated by KaOcie CornfieldMD.

## 2014-05-29 LAB — CBC WITH AUTOMATED DIFF
ABS. BASOPHILS: 0 10*3/uL (ref 0.0–0.1)
ABS. EOSINOPHILS: 0 10*3/uL (ref 0.0–0.4)
ABS. LYMPHOCYTES: 1.5 10*3/uL (ref 0.8–3.5)
ABS. MONOCYTES: 1.2 10*3/uL — ABNORMAL HIGH (ref 0.0–1.0)
ABS. NEUTROPHILS: 13.7 10*3/uL — ABNORMAL HIGH (ref 1.8–8.0)
BASOPHILS: 0 % (ref 0–1)
EOSINOPHILS: 0 % (ref 0–7)
HCT: 45.4 % (ref 36.6–50.3)
HGB: 15.5 g/dL (ref 12.1–17.0)
LYMPHOCYTES: 9 % — ABNORMAL LOW (ref 12–49)
MCH: 31.9 PG (ref 26.0–34.0)
MCHC: 34.1 g/dL (ref 30.0–36.5)
MCV: 93.4 FL (ref 80.0–99.0)
MONOCYTES: 7 % (ref 5–13)
NEUTROPHILS: 84 % — ABNORMAL HIGH (ref 32–75)
PLATELET: 433 10*3/uL — ABNORMAL HIGH (ref 150–400)
RBC: 4.86 M/uL (ref 4.10–5.70)
RDW: 12.6 % (ref 11.5–14.5)
WBC: 16.4 10*3/uL — ABNORMAL HIGH (ref 4.1–11.1)

## 2014-05-29 LAB — METABOLIC PANEL, COMPREHENSIVE
A-G Ratio: 1.2 (ref 1.1–2.2)
ALT (SGPT): 54 U/L (ref 12–78)
AST (SGOT): 18 U/L (ref 15–37)
Albumin: 5 g/dL (ref 3.5–5.0)
Alk. phosphatase: 79 U/L (ref 45–117)
Anion gap: 13 mmol/L (ref 5–15)
BUN/Creatinine ratio: 11 — ABNORMAL LOW (ref 12–20)
BUN: 15 MG/DL (ref 6–20)
Bilirubin, total: 1 MG/DL (ref 0.2–1.0)
CO2: 22 mmol/L (ref 21–32)
Calcium: 10.7 MG/DL — ABNORMAL HIGH (ref 8.5–10.1)
Chloride: 105 mmol/L (ref 97–108)
Creatinine: 1.33 MG/DL — ABNORMAL HIGH (ref 0.45–1.15)
GFR est AA: >60 mL/min/{1.73_m2} (ref >60)
GFR est non-AA: >60 mL/min/{1.73_m2} (ref >60)
Globulin: 4.3 g/dL — ABNORMAL HIGH (ref 2.0–4.0)
Glucose: 149 mg/dL — ABNORMAL HIGH (ref 65–100)
Potassium: 4.1 mmol/L (ref 3.5–5.1)
Protein, total: 9.3 g/dL — ABNORMAL HIGH (ref 6.4–8.2)
Sodium: 140 mmol/L (ref 136–145)

## 2014-05-29 LAB — LACTIC ACID: Lactic acid: 3.3 MMOL/L — ABNORMAL HIGH (ref 0.4–2.0)

## 2014-05-29 NOTE — ED Notes (Signed)
Attempted to call patient again still with no response. Checked bathrooms and outside with no response.

## 2014-05-29 NOTE — ED Notes (Signed)
Called patient to come back to exam room at this time with no answer, bathroom and outside checked with no answer. Will check back.

## 2014-06-01 NOTE — ED Provider Notes (Signed)
Patient is a 38 y.o. male presenting with vomiting and back pain.   Vomiting     Back Pain          Past Medical History   Diagnosis Date   ??? Epilepsy ([facility]) unknown        Past Surgical History   Procedure Laterality Date   ??? Kidney dialysate delivry sys       kidney surgery - unknown   ??? Pr brain avm surg,dural,complx       brain surgery - unknown had brain tumor         No family history on file.     History     Social History   ??? Marital Status: MARRIED     Spouse Name: N/A     Number of Children: N/A   ??? Years of Education: N/A     Occupational History   ??? Not on file.     Social History Main Topics   ??? Smoking status: Current Every Day Smoker -- 0.50 packs/day     Types: Cigarettes   ??? Smokeless tobacco: Never Used   ??? Alcohol Use: No   ??? Drug Use: Not on file   ??? Sexual Activity: Not on file     Other Topics Concern   ??? Not on file     Social History Narrative   ??? No narrative on file                  ALLERGIES: Review of patient's allergies indicates no known allergies.      Review of Systems   Gastrointestinal: Positive for vomiting.   Musculoskeletal: Positive for back pain.       Filed Vitals:    05/29/14 1826   BP: 163/101   Pulse: 89   Temp: 97.9 ??F (36.6 ??C)   Resp: 18   Height: 5\' 8"  (1.727 m)   Weight: 63.1 kg (139 lb 1.8 oz)   SpO2: 98%            Physical Exam     MDM    Procedures    11:57 AM    I was inadvertently assigned to this patient's treatment team.  I did not see this patient nor did I have any contact with this patient.  I had no involvement during the evaluation, treatment or disposition of this patient.  I am signing off this note to indicate only why my name appeared in the record.  Mickel Crow, MD

## 2014-11-01 ENCOUNTER — Inpatient Hospital Stay
Admit: 2014-11-01 | Discharge: 2014-11-01 | Disposition: A | Discharge status: Home / Self Care | Payer: MEDICAID | Attending: Emergency Medicine

## 2014-11-01 DIAGNOSIS — Z5111 Encounter for antineoplastic chemotherapy: Secondary | ICD-10-CM

## 2014-11-01 LAB — MAGNESIUM: Magnesium: 2.2 mg/dL (ref 1.6–2.4)

## 2014-11-01 LAB — METABOLIC PANEL, COMPREHENSIVE
A-G Ratio: 1.4 (ref 1.1–2.2)
ALT (SGPT): 45 U/L (ref 12–78)
AST (SGOT): 20 U/L (ref 15–37)
Albumin: 5.1 g/dL — ABNORMAL HIGH (ref 3.5–5.0)
Alk. phosphatase: 71 U/L (ref 45–117)
Anion gap: 10 mmol/L (ref 5–15)
BUN/Creatinine ratio: 16 (ref 12–20)
BUN: 25 MG/DL — ABNORMAL HIGH (ref 6–20)
Bilirubin, total: 2.1 MG/DL — ABNORMAL HIGH (ref 0.2–1.0)
CO2: 29 mmol/L (ref 21–32)
Calcium: 9.8 MG/DL (ref 8.5–10.1)
Chloride: 95 mmol/L — ABNORMAL LOW (ref 97–108)
Creatinine: 1.52 MG/DL — ABNORMAL HIGH (ref 0.70–1.30)
GFR est AA: >60 mL/min/{1.73_m2} (ref >60)
GFR est non-AA: 52 mL/min/{1.73_m2} — ABNORMAL LOW (ref >60)
Globulin: 3.7 g/dL (ref 2.0–4.0)
Glucose: 153 mg/dL — ABNORMAL HIGH (ref 65–100)
Potassium: 2.9 mmol/L — ABNORMAL LOW (ref 3.5–5.1)
Protein, total: 8.8 g/dL — ABNORMAL HIGH (ref 6.4–8.2)
Sodium: 134 mmol/L — ABNORMAL LOW (ref 136–145)

## 2014-11-01 LAB — CBC WITH AUTOMATED DIFF
ABS. BASOPHILS: 0 10*3/uL (ref 0.0–0.1)
ABS. EOSINOPHILS: 0 10*3/uL (ref 0.0–0.4)
ABS. LYMPHOCYTES: 1 10*3/uL (ref 0.8–3.5)
ABS. MONOCYTES: 1 10*3/uL (ref 0.0–1.0)
ABS. NEUTROPHILS: 14.8 10*3/uL — ABNORMAL HIGH (ref 1.8–8.0)
BASOPHILS: 0 % (ref 0–1)
EOSINOPHILS: 0 % (ref 0–7)
HCT: 43.2 % (ref 36.6–50.3)
HGB: 15.5 g/dL (ref 12.1–17.0)
LYMPHOCYTES: 6 % — ABNORMAL LOW (ref 12–49)
MCH: 32.5 PG (ref 26.0–34.0)
MCHC: 35.9 g/dL (ref 30.0–36.5)
MCV: 90.6 FL (ref 80.0–99.0)
MONOCYTES: 6 % (ref 5–13)
NEUTROPHILS: 88 % — ABNORMAL HIGH (ref 32–75)
PLATELET: 343 10*3/uL (ref 150–400)
RBC: 4.77 M/uL (ref 4.10–5.70)
RDW: 12.4 % (ref 11.5–14.5)
WBC: 16.8 10*3/uL — ABNORMAL HIGH (ref 4.1–11.1)

## 2014-11-01 LAB — LIPASE: Lipase: 115 U/L (ref 73–393)

## 2014-11-01 MED ORDER — SODIUM CHLORIDE 0.9% BOLUS IV
0.9 % | INTRAVENOUS | Status: AC
Start: 2014-11-01 — End: 2014-11-01
  Administered 2014-11-01: 21:00:00 via INTRAVENOUS

## 2014-11-01 MED ORDER — PROCHLORPERAZINE EDISYLATE 5 MG/ML INJECTION
5 mg/mL | INTRAMUSCULAR | Status: AC
Start: 2014-11-01 — End: 2014-11-01
  Administered 2014-11-01: 21:00:00 via INTRAVENOUS

## 2014-11-01 MED FILL — PROCHLORPERAZINE EDISYLATE 5 MG/ML INJECTION: 5 mg/mL | INTRAMUSCULAR | Qty: 2

## 2014-11-01 MED FILL — SODIUM CHLORIDE 0.9 % IV: INTRAVENOUS | Qty: 1000

## 2014-11-01 NOTE — ED Notes (Signed)
Tolerating PO without nausea or vomiting. Reports feeling "ready to go home." Dr. Daleen BoSalley aware.

## 2014-11-01 NOTE — ED Notes (Signed)
Dr. Daleen BoSalley at bedside to provide discharge paperwork. Vital signs stable. Pt in no apparent distress at this time. Mental status at baseline. Ambulatory to waiting room with steady gate, discharge paperwork in hand. Accompanied by his mother.

## 2014-11-01 NOTE — ED Provider Notes (Signed)
HPI Comments: Carl Howell is a 38 y.o. Male who has stage 3 brain cancer and presents [facility] ED ambulatory with cc several episodes of nausea and vomiting and associated centralized abd pain x 6days. Pt also reports one episode of diarrhea x2days but no such sxs since then. Pt states he takes oral chemo medication at home. Pt also states that his chemo medication dose was recently increased in the last 2-3 weeks and that nausea is a common sxs he experiences when his doses are increased. Pt states he took Zofran via both sublingual and oral forms with no sxs relief however the pt does note that taking hot baths temporarily relieves his sxs. Pt notes he has had two surgery's, one in '08 and the other in may '15 [facility] to remove brain tumors. Pt specifically denies any sxs of difficulty urinating.          Neurosurgeon: Dr. Trinna PostScott Graham ([facility])      PMHx is significant for: Epilepsy  PSHx is significant for: Kidney dialysate delivery sys, Brain Surgery  Social Hx: +Tobacco(.5ppd), -EtOH, -Illicit        There are no other complaints changes or physical findings at this time  Written by Beatriz ChancellorGregory Robinson, ED Scribe as dictated by Brigid ReJohn J Salley, MD  .                        The history is provided by the patient.        Past Medical History:   Diagnosis Date   ??? Epilepsy ([facility]) unknown       Past Surgical History:   Procedure Laterality Date   ??? Kidney dialysate delivry sys       kidney surgery - unknown   ??? Pr brain avm surg,dural,complx       brain surgery - unknown had brain tumor         No family history on file.    History     Social History   ??? Marital Status: MARRIED     Spouse Name: N/A     Number of Children: N/A   ??? Years of Education: N/A     Occupational History   ??? Not on file.     Social History Main Topics   ??? Smoking status: Current Every Day Smoker -- 0.50 packs/day     Types: Cigarettes   ??? Smokeless tobacco: Never Used   ??? Alcohol Use: No   ??? Drug Use: Not on file    ??? Sexual Activity: Not on file     Other Topics Concern   ??? Not on file     Social History Narrative   ??? No narrative on file                ALLERGIES: Review of patient's allergies indicates no known allergies.      Review of Systems   Constitutional: Negative for fever, chills and appetite change.   HENT: Negative for congestion.    Eyes: Negative for visual disturbance.   Respiratory: Negative for cough, shortness of breath and wheezing.    Cardiovascular: Negative for chest pain, palpitations and leg swelling.   Gastrointestinal: Positive for nausea, vomiting, abdominal pain and diarrhea.   Genitourinary: Negative for dysuria, urgency, frequency and difficulty urinating.   Musculoskeletal: Negative for myalgias, back pain, joint swelling and neck stiffness.   Skin: Negative for rash.   Neurological: Negative for dizziness, syncope, weakness and  headaches.   Hematological: Negative for adenopathy.   Psychiatric/Behavioral: Negative for behavioral problems and dysphoric mood.       Filed Vitals:    11/01/14 1440   BP: 155/102   Pulse: 109   Temp: 97.3 ??F (36.3 ??C)   Resp: 16   Height: 5\' 8"  (1.727 m)   Weight: 70.3 kg (154 lb 15.7 oz)   SpO2: 100%            Physical Exam   Constitutional: He is oriented to person, place, and time. He appears well-developed and well-nourished. No distress.   HENT:   Head: Normocephalic and atraumatic.   Mouth/Throat: Oropharynx is clear and moist.   Eyes: Conjunctivae and EOM are normal. Pupils are equal, round, and reactive to light. No scleral icterus.   Neck: Normal range of motion. Neck supple.   Cardiovascular: Normal rate and regular rhythm.  Exam reveals no gallop.    No murmur heard.  Pulmonary/Chest: Effort normal. No stridor. No respiratory distress. He has no wheezes. He has no rales.   Abdominal: Soft. Bowel sounds are normal. He exhibits no distension and no mass. There is no tenderness. There is no rebound and no guarding.    Musculoskeletal: Normal range of motion. He exhibits no edema.   Lymphadenopathy:     He has no cervical adenopathy.   Neurological: He is alert and oriented to person, place, and time. No cranial nerve deficit. Coordination normal.   Skin: Skin is warm and dry. No rash noted. No erythema.   Psychiatric: He has a normal mood and affect.   Nursing note and vitals reviewed.   Written by Beatriz ChancellorGregory Robinson, ED Scribe as dictated by Brigid ReJohn J Salley, MD              MDM  Number of Diagnoses or Management Options  Diagnosis management comments: DDx: Dehydration, Renal dysfunction, Chemo induced vomiting       Amount and/or Complexity of Data Reviewed  Clinical lab tests: ordered and reviewed  Review and summarize past medical records: yes    Patient Progress  Patient progress: stable      Procedures    6:17 PM  Much improved after fluid and antiemetics. Tolerating PO. Will discharge home.  Julianne HandlerJohn J Salley, Jr, MD

## 2014-11-01 NOTE — ED Notes (Signed)
Ambulatory to restroom, steady gait noted.

## 2014-11-01 NOTE — ED Notes (Signed)
Assumed care of patient. Patient placed in position of comfort. Call bell in reach. Skin warm and dry. Respirations even and unlabored. In no apparent distress at this time. Pt presents ambulatory into the ED with c/o nausea, vomiting and diffuse abdominal discomfort-currently receiving chemotherapy. No further symptoms reported at this time. Pt's mother remains at bedside. Will continue to monitor.

## 2015-04-16 ENCOUNTER — Inpatient Hospital Stay
Admit: 2015-04-16 | Discharge: 2015-04-17 | Disposition: A | Discharge status: Home / Self Care | Payer: MEDICAID | Attending: Emergency Medicine

## 2015-04-16 DIAGNOSIS — R112 Nausea with vomiting, unspecified: Secondary | ICD-10-CM

## 2015-04-16 LAB — METABOLIC PANEL, COMPREHENSIVE
A-G Ratio: 1.5 (ref 1.1–2.2)
ALT (SGPT): 32 U/L (ref 12–78)
AST (SGOT): 21 U/L (ref 15–37)
Albumin: 5.1 g/dL — ABNORMAL HIGH (ref 3.5–5.0)
Alk. phosphatase: 67 U/L (ref 45–117)
Anion gap: 14 mmol/L (ref 5–15)
BUN/Creatinine ratio: 18 (ref 12–20)
BUN: 31 MG/DL — ABNORMAL HIGH (ref 6–20)
Bilirubin, total: 1.3 MG/DL — ABNORMAL HIGH (ref 0.2–1.0)
CO2: 24 mmol/L (ref 21–32)
Calcium: 9.6 MG/DL (ref 8.5–10.1)
Chloride: 95 mmol/L — ABNORMAL LOW (ref 97–108)
Creatinine: 1.76 MG/DL — ABNORMAL HIGH (ref 0.70–1.30)
GFR est AA: 53 mL/min/{1.73_m2} — ABNORMAL LOW (ref >60)
GFR est non-AA: 44 mL/min/{1.73_m2} — ABNORMAL LOW (ref >60)
Globulin: 3.5 g/dL (ref 2.0–4.0)
Glucose: 112 mg/dL — ABNORMAL HIGH (ref 65–100)
Potassium: 2.6 mmol/L — CL (ref 3.5–5.1)
Protein, total: 8.6 g/dL — ABNORMAL HIGH (ref 6.4–8.2)
Sodium: 133 mmol/L — ABNORMAL LOW (ref 136–145)

## 2015-04-16 LAB — CBC WITH AUTOMATED DIFF
ABS. BASOPHILS: 0 10*3/uL (ref 0.0–0.1)
ABS. EOSINOPHILS: 0 10*3/uL (ref 0.0–0.4)
ABS. LYMPHOCYTES: 1.6 10*3/uL (ref 0.8–3.5)
ABS. MONOCYTES: 1.4 10*3/uL — ABNORMAL HIGH (ref 0.0–1.0)
ABS. NEUTROPHILS: 12 10*3/uL — ABNORMAL HIGH (ref 1.8–8.0)
BASOPHILS: 0 % (ref 0–1)
EOSINOPHILS: 0 % (ref 0–7)
HCT: 39.3 % (ref 36.6–50.3)
HGB: 14.2 g/dL (ref 12.1–17.0)
LYMPHOCYTES: 11 % — ABNORMAL LOW (ref 12–49)
MCH: 32.5 PG (ref 26.0–34.0)
MCHC: 36.1 g/dL (ref 30.0–36.5)
MCV: 89.9 FL (ref 80.0–99.0)
MONOCYTES: 9 % (ref 5–13)
NEUTROPHILS: 80 % — ABNORMAL HIGH (ref 32–75)
PLATELET: 305 10*3/uL (ref 150–400)
RBC: 4.37 M/uL (ref 4.10–5.70)
RDW: 12 % (ref 11.5–14.5)
WBC: 15.1 10*3/uL — ABNORMAL HIGH (ref 4.1–11.1)

## 2015-04-16 LAB — MAGNESIUM: Magnesium: 1.9 mg/dL (ref 1.6–2.4)

## 2015-04-16 LAB — LIPASE: Lipase: 77 U/L (ref 73–393)

## 2015-04-16 MED ORDER — ONDANSETRON 4 MG TAB, RAPID DISSOLVE
4 mg | ORAL_TABLET | Freq: Three times a day (TID) | ORAL | Status: DC | PRN
Start: 2015-04-16 — End: 2015-04-16

## 2015-04-16 MED ORDER — ONDANSETRON 4 MG TAB, RAPID DISSOLVE
4 mg | ORAL_TABLET | Freq: Three times a day (TID) | ORAL | Status: DC | PRN
Start: 2015-04-16 — End: 2016-08-03

## 2015-04-16 MED ORDER — POTASSIUM CHLORIDE 10 MEQ/100 ML IV PIGGY BACK
10 mEq/0 mL | INTRAVENOUS | Status: AC
Start: 2015-04-16 — End: 2015-04-16
  Administered 2015-04-16: 22:00:00 via INTRAVENOUS

## 2015-04-16 MED ORDER — POTASSIUM CHLORIDE SR 20 MEQ TAB, PARTICLES/CRYSTALS
20 mEq | ORAL | Status: AC
Start: 2015-04-16 — End: 2015-04-16
  Administered 2015-04-16: 22:00:00 via ORAL

## 2015-04-16 MED ORDER — ONDANSETRON (PF) 4 MG/2 ML INJECTION
4 mg/2 mL | Freq: Once | INTRAMUSCULAR | Status: AC
Start: 2015-04-16 — End: 2015-04-16
  Administered 2015-04-16: 21:00:00 via INTRAVENOUS

## 2015-04-16 MED ORDER — SODIUM CHLORIDE 0.9% BOLUS IV
0.9 % | Freq: Once | INTRAVENOUS | Status: AC
Start: 2015-04-16 — End: 2015-04-16
  Administered 2015-04-16: 21:00:00 via INTRAVENOUS

## 2015-04-16 MED ORDER — POTASSIUM CHLORIDE 10 MEQ/100 ML IV PIGGY BACK
10 mEq/0 mL | INTRAVENOUS | Status: AC
Start: 2015-04-16 — End: 2015-04-16
  Administered 2015-04-16: 23:00:00 via INTRAVENOUS

## 2015-04-16 MED ORDER — SODIUM CHLORIDE 0.9 % IJ SYRG
INTRAMUSCULAR | Status: DC | PRN
Start: 2015-04-16 — End: 2015-04-16

## 2015-04-16 MED FILL — ONDANSETRON (PF) 4 MG/2 ML INJECTION: 4 mg/2 mL | INTRAMUSCULAR | Qty: 4

## 2015-04-16 MED FILL — SODIUM CHLORIDE 0.9 % IV: INTRAVENOUS | Qty: 1000

## 2015-04-16 MED FILL — POTASSIUM CHLORIDE SR 20 MEQ TAB, PARTICLES/CRYSTALS: 20 mEq | ORAL | Qty: 2

## 2015-04-16 MED FILL — POTASSIUM CHLORIDE 10 MEQ/100 ML IV PIGGY BACK: 10 mEq/0 mL | INTRAVENOUS | Qty: 100

## 2015-04-16 NOTE — ED Notes (Signed)
MD at bedside.Patient will be discharged once potassium is done.

## 2015-04-16 NOTE — ED Notes (Signed)
Patient states that nausea has decreased since provided zofran.

## 2015-04-16 NOTE — ED Notes (Signed)
Assumed care of patient. Patient states that he is a neuro cancer patient that has been on chemo that has recently switched to every other week. Patient is c/o N/V today. Pt visited [facility] yesterday and states he felt better after provided fluids however it came back. Patient takes zofran at home and states that is provided some relief. Patient placed on monitor x 3. Call bell within reach. Plan of care discussed.

## 2015-04-16 NOTE — ED Provider Notes (Signed)
Patient is a 39 y.o. male presenting with vomiting. The history is provided by the patient and a parent.   Vomiting   This is a recurrent problem. The current episode started more than 2 days ago. The problem occurs 5 to 10 times per day. The problem has not changed since onset.The emesis has an appearance of stomach contents. There has been no fever. Associated symptoms include abdominal pain. Pertinent negatives include no chills, no fever, no sweats, no diarrhea, no headaches and no headaches.      39 y/o male w hx of brain CA on chemotherapy, recent change to every other week regimen, who presents with N/V for past two days. Seen at [facility] two days ago for same, given zofran and fluids, passed PO challenge, and DC'd home. Pt states he has been unable to tolerate any solids since that time, some liquids some of the time. NBNB emesis, only after PO intake. Has been able to take his AEDs. +BMs, +flatus. Sim to prior issues with chemo, pt states he typically rec's antiemetics and IVF and goes home.     Past Medical History:   Diagnosis Date   ??? Epilepsy (HCC) unknown   ??? Cancer of brain [facility])        Past Surgical History:   Procedure Laterality Date   ??? Kidney dialysate delivry sys       kidney surgery - unknown   ??? Pr brain avm surg,dural,complx       brain surgery - unknown had brain tumor   ??? Pr neurological procedure unlisted       craniotomy         History reviewed. No pertinent family history.    History     Social History   ??? Marital Status: MARRIED     Spouse Name: N/A   ??? Number of Children: N/A   ??? Years of Education: N/A     Occupational History   ??? Not on file.     Social History Main Topics   ??? Smoking status: Former Smoker -- 0.25 packs/day     Types: Cigarettes   ??? Smokeless tobacco: Never Used   ??? Alcohol Use: No   ??? Drug Use: Yes     Special: Marijuana   ??? Sexual Activity: Not on file     Other Topics Concern   ??? Not on file     Social History Narrative          ALLERGIES: Review of patient's allergies indicates no known allergies.    Review of Systems   Constitutional: Negative for fever, chills and activity change.   HENT: Negative for hearing loss and sore throat.    Eyes: Negative for visual disturbance.   Respiratory: Negative for chest tightness and shortness of breath.    Cardiovascular: Negative for chest pain.   Gastrointestinal: Positive for nausea, vomiting and abdominal pain. Negative for diarrhea, constipation, blood in stool and anal bleeding.   Endocrine: Negative for polyuria.   Genitourinary: Negative for frequency.   Skin: Negative for rash.   Allergic/Immunologic: Negative for immunocompromised state.   Neurological: Negative for seizures and headaches.   Psychiatric/Behavioral: Negative for confusion.       Filed Vitals:    04/16/15 1643   BP: 158/101   Pulse: 91   Temp: 98.4 ??F (36.9 ??C)   Resp: 16   Height:  (1.702 m)   Weight: 60.5 kg (133 lb 6.1 oz)   SpO2: 98%  Physical Exam   Constitutional: He is oriented to person, place, and time. He appears well-developed and well-nourished. No distress.   HENT:   Head: Normocephalic and atraumatic.   Right Ear: External ear normal.   Left Ear: External ear normal.   Mouth/Throat: Oropharynx is clear and moist. No oropharyngeal exudate.   Eyes: Conjunctivae are normal. Pupils are equal, round, and reactive to light. No scleral icterus.   Neck: Neck supple.   Cardiovascular: Normal rate, regular rhythm and intact distal pulses.    Pulmonary/Chest: Effort normal and breath sounds normal. No respiratory distress.   Abdominal: Soft. Bowel sounds are normal. He exhibits no distension. There is no tenderness. There is no rebound and no guarding.   Musculoskeletal: He exhibits no edema.   Lymphadenopathy:     He has no cervical adenopathy.   Neurological: He is alert and oriented to person, place, and time.   Skin: Skin is warm and dry. He is not diaphoretic.    Psychiatric: He has a normal mood and affect. His behavior is normal.        MDM  Number of Diagnoses or Management Options  Diagnosis management comments: Ha of brain CA and recurrent N/V after chemotherapy, now w N/V refractory to PO meds at home. Likely chemotherapy related, will admin higher dosing of Zofran and IVF, reassess. Intracranial process in ddx but unlikely given normal neuro exam, no h/a, gait changes, or any other complaints per pt. Does endorse some epigastric discomfort assoc w vomiting, will eval for liver/GB dysfunction with compmet and basic labs. Also obtain lipase to eval for pancreatitis. This pt is well appearing, likely dc home after initial w/u and treatment if passes PO challenge. Has good f/u @ [facility].       Procedures      Chief Complaint   Patient presents with   ??? Vomiting     patient has brain CA and takes chemo every other week. this is his off week and he began vomiting tuesday and seen at [facility] but continues to have vomiting       6:02 PM  The patients presenting problems have been discussed, and they are in agreement with the care plan formulated and outlined with them.  I have encouraged them to ask questions as they arise throughout their visit.    MEDICATIONS GIVEN:  Medications   sodium chloride (NS) flush 5-10 mL (not administered)   potassium chloride 10 mEq in 100 ml IVPB (not administered)   potassium chloride 10 mEq in 100 ml IVPB (not administered)   sodium chloride 0.9 % bolus infusion 1,000 mL (1,000 mL IntraVENous New Bag 04/16/15 1726)   ondansetron (ZOFRAN) injection 8 mg (8 mg IntraVENous Given 04/16/15 1724)   potassium chloride (K-DUR, KLOR-CON) SR tablet 40 mEq (40 mEq Oral Given 04/16/15 1802)       LABS REVIEWED:  Labs Reviewed   METABOLIC PANEL, COMPREHENSIVE - Abnormal; Notable for the following:     Sodium 133 (*)     Potassium 2.6 (*)     Chloride 95 (*)     Glucose 112 (*)     BUN 31 (*)     Creatinine 1.76 (*)     GFR est AA 53 (*)     GFR est non-AA 44 (*)      Bilirubin, total 1.3 (*)     Protein, total 8.6 (*)     Albumin 5.1 (*)     All other components within normal  limits   CBC WITH AUTOMATED DIFF - Abnormal; Notable for the following:     WBC 15.1 (*)     NEUTROPHILS 80 (*)     LYMPHOCYTES 11 (*)     ABS. NEUTROPHILS 12.0 (*)     ABS. MONOCYTES 1.4 (*)     All other components within normal limits   MAGNESIUM   LIPASE         PROGRESS NOTES:  1800 - pt states he feels much better. I explained the low K+ result with him, will give IV and PO repletion and reassess. Still rec'ing IVF bolus.    1933 - pt feels fine, tolerating PO liquids, rec'ing last K+ infusion. Requesting DC home with SL Zofran.    DIAGNOSIS:    1. Non-intractable vomiting with nausea, vomiting of unspecified type    2. Hypokalemia        PLAN:  1- Aggressive treatment of nausea w IV meds  2- Basic labs = hypokalemia, treat w PO and IV runs  3- DC home after PO challenge = passed. DC home w appropr return precautions. Write for more SL Zofran.      ED COURSE: The patients hospital course has been uncomplicated.      7:34 PM  Cadon Yarbrough's  results have been reviewed with him.  He has been counseled regarding his diagnosis.  He verbally conveys understanding and agreement of the signs, symptoms, diagnosis, treatment and prognosis and additionally agrees to follow up as recommended with Dr. Vaughan BrownerPhys Other, MD in 24 - 48 hours.  He also agrees with the care-plan and conveys that all of his questions have been answered.  I have also put together some discharge instructions for him that include: 1) educational information regarding their diagnosis, 2) how to care for their diagnosis at home, as well a 3) list of reasons why they would want to return to the ED prior to their follow-up appointment, should their condition change.

## 2015-04-16 NOTE — ED Notes (Signed)
Discharge instructions given to pt.  All questions answered and pt verbalized understanding.   V/S stable @ time of discharge.  Pt ambulatory out of unit.

## 2015-04-16 NOTE — ED Notes (Signed)
Patient resting comfortably. Warm blanket provided lights dimmed for comfort. Plan of care discussed. Call bell within reach.

## 2015-11-15 HISTORY — PX: BACK SURGERY: SHX140

## 2016-07-30 ENCOUNTER — Inpatient Hospital Stay

## 2016-07-30 ENCOUNTER — Inpatient Hospital Stay: Payer: Self-pay | Primary: Family Medicine

## 2016-07-30 ENCOUNTER — Inpatient Hospital Stay
Admit: 2016-07-30 | Discharge: 2016-07-31 | Disposition: A | Discharge status: Other Acute Inpatient Hospital | Payer: Self-pay | Attending: Emergency Medicine | Admitting: Emergency Medicine

## 2016-07-30 ENCOUNTER — Emergency Department: Admit: 2016-07-30 | Payer: Self-pay | Primary: Family Medicine

## 2016-07-30 ENCOUNTER — Inpatient Hospital Stay: Admit: 2016-07-30 | Payer: Self-pay | Primary: Family Medicine

## 2016-07-30 DIAGNOSIS — S065X9A Traumatic subdural hemorrhage with loss of consciousness of unspecified duration, initial encounter: Secondary | ICD-10-CM

## 2016-07-30 DIAGNOSIS — S22052A Unstable burst fracture of T5-T6 vertebra, initial encounter for closed fracture: Secondary | ICD-10-CM

## 2016-07-30 LAB — CBC WITH AUTOMATED DIFF
ABS. BASOPHILS: 0 10*3/uL (ref 0.0–0.1)
ABS. EOSINOPHILS: 0.1 10*3/uL (ref 0.0–0.4)
ABS. LYMPHOCYTES: 1.7 10*3/uL (ref 0.8–3.5)
ABS. MONOCYTES: 0.6 10*3/uL (ref 0.0–1.0)
ABS. NEUTROPHILS: 8.3 10*3/uL — ABNORMAL HIGH (ref 1.8–8.0)
BASOPHILS: 0 % (ref 0–1)
EOSINOPHILS: 1 % (ref 0–7)
HCT: 41.8 % (ref 36.6–50.3)
HGB: 14.3 g/dL (ref 12.1–17.0)
LYMPHOCYTES: 16 % (ref 12–49)
MCH: 31.3 PG (ref 26.0–34.0)
MCHC: 34.2 g/dL (ref 30.0–36.5)
MCV: 91.5 FL (ref 80.0–99.0)
MONOCYTES: 6 % (ref 5–13)
NEUTROPHILS: 77 % — ABNORMAL HIGH (ref 32–75)
PLATELET: 303 10*3/uL (ref 150–400)
RBC: 4.57 M/uL (ref 4.10–5.70)
RDW: 12.6 % (ref 11.5–14.5)
WBC: 10.7 10*3/uL (ref 4.1–11.1)

## 2016-07-30 LAB — DRUG SCREEN, URINE
AMPHETAMINES: NEGATIVE
BARBITURATES: NEGATIVE
BENZODIAZEPINES: NEGATIVE
COCAINE: NEGATIVE
METHADONE: NEGATIVE
OPIATES: POSITIVE — AB
PCP(PHENCYCLIDINE): NEGATIVE
THC (TH-CANNABINOL): POSITIVE — AB

## 2016-07-30 LAB — CBC W/O DIFF
HCT: 36.4 % — ABNORMAL LOW (ref 36.6–50.3)
HGB: 12.7 g/dL (ref 12.1–17.0)
MCH: 32.2 PG (ref 26.0–34.0)
MCHC: 34.9 g/dL (ref 30.0–36.5)
MCV: 92.4 FL (ref 80.0–99.0)
PLATELET: 260 10*3/uL (ref 150–400)
RBC: 3.94 M/uL — ABNORMAL LOW (ref 4.10–5.70)
RDW: 12.4 % (ref 11.5–14.5)
WBC: 18 10*3/uL — ABNORMAL HIGH (ref 4.1–11.1)

## 2016-07-30 LAB — HEPATIC FUNCTION PANEL
A-G Ratio: 1.2 (ref 1.1–2.2)
ALT (SGPT): 25 U/L (ref 12–78)
AST (SGOT): 24 U/L (ref 15–37)
Albumin: 4.3 g/dL (ref 3.5–5.0)
Alk. phosphatase: 81 U/L (ref 45–117)
Bilirubin, direct: 0.1 MG/DL (ref 0.0–0.2)
Bilirubin, total: 0.6 MG/DL (ref 0.2–1.0)
Globulin: 3.7 g/dL (ref 2.0–4.0)
Protein, total: 8 g/dL (ref 6.4–8.2)

## 2016-07-30 LAB — GLUCOSE, POC
Glucose (POC): 119 mg/dL — ABNORMAL HIGH (ref 65–100)
Glucose (POC): 146 mg/dL — ABNORMAL HIGH (ref 65–100)

## 2016-07-30 LAB — PTT: aPTT: 24.5 s (ref 22.1–32.5)

## 2016-07-30 LAB — METABOLIC PANEL, BASIC
Anion gap: 9 mmol/L (ref 5–15)
BUN/Creatinine ratio: 5 — ABNORMAL LOW (ref 12–20)
BUN: 5 MG/DL — ABNORMAL LOW (ref 6–20)
CO2: 25 mmol/L (ref 21–32)
Calcium: 8.6 MG/DL (ref 8.5–10.1)
Chloride: 99 mmol/L (ref 97–108)
Creatinine: 1 MG/DL (ref 0.70–1.30)
GFR est AA: >60 mL/min/{1.73_m2} (ref >60)
GFR est non-AA: >60 mL/min/{1.73_m2} (ref >60)
Glucose: 114 mg/dL — ABNORMAL HIGH (ref 65–100)
Potassium: 3.5 mmol/L (ref 3.5–5.1)
Sodium: 133 mmol/L — ABNORMAL LOW (ref 136–145)

## 2016-07-30 LAB — PROTHROMBIN TIME + INR
INR: 1.2 — ABNORMAL HIGH (ref 0.9–1.1)
Prothrombin time: 12 s — ABNORMAL HIGH (ref 9.0–11.1)

## 2016-07-30 LAB — ETHYL ALCOHOL: ALCOHOL(ETHYL),SERUM: <10 MG/DL (ref <10)

## 2016-07-30 LAB — MAGNESIUM: Magnesium: 2.2 mg/dL (ref 1.6–2.4)

## 2016-07-30 MED ORDER — LEVETIRACETAM 500 MG/5 ML IV SOLN
5005 mg/5 mL | Freq: Two times a day (BID) | INTRAVENOUS | Status: DC
Start: 2016-07-30 — End: 2016-07-30
  Administered 2016-07-30: 22:00:00 via INTRAVENOUS

## 2016-07-30 MED ORDER — LORAZEPAM 2 MG/ML IJ SOLN
2 mg/mL | Freq: Four times a day (QID) | INTRAMUSCULAR | Status: DC | PRN
Start: 2016-07-30 — End: 2016-07-30

## 2016-07-30 MED ORDER — LORAZEPAM 2 MG/ML IJ SOLN
2 mg/mL | Freq: Once | INTRAMUSCULAR | Status: AC
Start: 2016-07-30 — End: 2016-07-30
  Administered 2016-07-30: via INTRAVENOUS

## 2016-07-30 MED ORDER — FAMOTIDINE (PF) 20 MG/2 ML IV
202 mg/2 mL | Freq: Two times a day (BID) | INTRAVENOUS | Status: DC
Start: 2016-07-30 — End: 2016-07-30
  Administered 2016-07-31: 01:00:00 via INTRAVENOUS

## 2016-07-30 MED ORDER — LORAZEPAM 2 MG/ML IJ SOLN
2 mg/mL | Freq: Once | INTRAMUSCULAR | Status: AC
Start: 2016-07-30 — End: 2016-07-30
  Administered 2016-07-30: 14:00:00 via INTRAVENOUS

## 2016-07-30 MED ORDER — MORPHINE 2 MG/ML INJECTION
2 mg/mL | INTRAMUSCULAR | Status: AC
Start: 2016-07-30 — End: 2016-07-30
  Administered 2016-07-30: 14:00:00 via INTRAVENOUS

## 2016-07-30 MED ORDER — VENLAFAXINE SR 150 MG 24 HR CAP
150 mg | Freq: Every day | ORAL | Status: DC
Start: 2016-07-30 — End: 2016-07-30

## 2016-07-30 MED ORDER — DIPHTH,PERTUS(AC)TETANUS VAC(PF) 2.5 LF UNIT-8 MCG-5 LF/0.5 ML INJ
Freq: Once | INTRAMUSCULAR | Status: AC
Start: 2016-07-30 — End: 2016-07-30
  Administered 2016-07-30: 16:00:00 via INTRAMUSCULAR

## 2016-07-30 MED ORDER — SODIUM CHLORIDE 0.9 % IV
200 mg/20 mL | Freq: Two times a day (BID) | INTRAVENOUS | Status: DC
Start: 2016-07-30 — End: 2016-07-30
  Administered 2016-07-30: 22:00:00 via INTRAVENOUS

## 2016-07-30 MED ORDER — LEVETIRACETAM 500 MG TAB
500 mg | Freq: Two times a day (BID) | ORAL | Status: DC
Start: 2016-07-30 — End: 2016-07-30

## 2016-07-30 MED ORDER — LORAZEPAM 2 MG/ML IJ SOLN
2 mg/mL | Freq: Once | INTRAMUSCULAR | Status: DC
Start: 2016-07-30 — End: 2016-07-30

## 2016-07-30 MED ORDER — LORAZEPAM 2 MG/ML IJ SOLN
2 mg/mL | INTRAMUSCULAR | Status: AC
Start: 2016-07-30 — End: 2016-07-30
  Administered 2016-07-30: 17:00:00 via INTRAVENOUS

## 2016-07-30 MED ORDER — SODIUM CHLORIDE 0.9 % IJ SYRG
Freq: Three times a day (TID) | INTRAMUSCULAR | Status: DC
Start: 2016-07-30 — End: 2016-07-30

## 2016-07-30 MED ORDER — SERTRALINE 50 MG TAB
50 mg | Freq: Every day | ORAL | Status: DC
Start: 2016-07-30 — End: 2016-07-30

## 2016-07-30 MED ORDER — MORPHINE 2 MG/ML INJECTION
2 mg/mL | INTRAMUSCULAR | Status: AC
Start: 2016-07-30 — End: 2016-07-30
  Administered 2016-07-30: 16:00:00 via INTRAVENOUS

## 2016-07-30 MED ORDER — LACOSAMIDE 100 MG TAB
100 mg | Freq: Two times a day (BID) | ORAL | Status: DC
Start: 2016-07-30 — End: 2016-07-30

## 2016-07-30 MED ORDER — ONDANSETRON (PF) 4 MG/2 ML INJECTION
4 mg/2 mL | Freq: Four times a day (QID) | INTRAMUSCULAR | Status: DC | PRN
Start: 2016-07-30 — End: 2016-07-30
  Administered 2016-07-30: 22:00:00 via INTRAVENOUS

## 2016-07-30 MED ORDER — MORPHINE 2 MG/ML INJECTION
2 mg/mL | INTRAMUSCULAR | Status: AC
Start: 2016-07-30 — End: 2016-07-30
  Administered 2016-07-30: 17:00:00 via INTRAVENOUS

## 2016-07-30 MED ORDER — SODIUM CHLORIDE 0.9 % IJ SYRG
INTRAMUSCULAR | Status: DC | PRN
Start: 2016-07-30 — End: 2016-07-30

## 2016-07-30 MED ORDER — LORAZEPAM 2 MG/ML IJ SOLN
2 mg/mL | INTRAMUSCULAR | Status: DC | PRN
Start: 2016-07-30 — End: 2016-07-30
  Administered 2016-07-30 – 2016-07-31 (×3): via INTRAVENOUS

## 2016-07-30 MED ORDER — FAMOTIDINE 20 MG TAB
20 mg | Freq: Two times a day (BID) | ORAL | Status: DC
Start: 2016-07-30 — End: 2016-07-30

## 2016-07-30 MED ORDER — SODIUM CHLORIDE 0.9 % IV
INTRAVENOUS | Status: DC
Start: 2016-07-30 — End: 2016-07-30
  Administered 2016-07-30: 22:00:00 via INTRAVENOUS

## 2016-07-30 MED FILL — VIMPAT 200 MG/20 ML INTRAVENOUS SOLUTION: 200 mg/20 mL | INTRAVENOUS | Qty: 20

## 2016-07-30 MED FILL — LORAZEPAM 2 MG/ML IJ SOLN: 2 mg/mL | INTRAMUSCULAR | Qty: 1

## 2016-07-30 MED FILL — MORPHINE 2 MG/ML INJECTION: 2 mg/mL | INTRAMUSCULAR | Qty: 2

## 2016-07-30 MED FILL — LEVETIRACETAM 250 MG TAB: 250 mg | ORAL | Qty: 1

## 2016-07-30 MED FILL — ONDANSETRON (PF) 4 MG/2 ML INJECTION: 4 mg/2 mL | INTRAMUSCULAR | Qty: 2

## 2016-07-30 MED FILL — KEPPRA 500 MG/5 ML INTRAVENOUS SOLUTION: 500 mg/5 mL | INTRAVENOUS | Qty: 17.5

## 2016-07-30 MED FILL — EFFEXOR XR 150 MG CAPSULE,EXTENDED RELEASE: 150 mg | ORAL | Qty: 1

## 2016-07-30 MED FILL — SODIUM CHLORIDE 0.9 % IV: INTRAVENOUS | Qty: 1000

## 2016-07-30 MED FILL — BOOSTRIX TDAP 2.5 LF UNIT-8 MCG-5 LF/0.5 ML INTRAMUSCULAR SYRINGE: INTRAMUSCULAR | Qty: 1

## 2016-07-30 MED FILL — SERTRALINE 50 MG TAB: 50 mg | ORAL | Qty: 2

## 2016-07-30 NOTE — ED Notes (Addendum)
Assumed care of patient. Patient placed on seizure precautions

## 2016-07-30 NOTE — Consults (Signed)
[facility]   [address]   [place], [place] [id number]   CONSULTATION       Name:  Carl Howell, Carl Howell   MR#:  [id number]   DOB:  02-May-1976   Account #:  [id number]    Date of Consultation:  07/30/2016   Date of Adm:  07/30/2016       CHIEF COMPLAINT: A 40 year old man with a small, acute right-sided   subdural hematoma after a fall as a result of a seizure.    HISTORY OF PRESENT ILLNESS: The patient has a history of a right   parieto-occipital glioma, he was first operated on more than 10 years   ago, again in 2008, [facility] for a low-grade glioma. A second operation was for an   operation that involved the transition of that same tumor to a higher   grade tumor. This history is given by the patient's mother, who was at   the bedside. Today he had a seizure. He is on Vimpat and Keppra.   Apparently the seizures have been increasing in frequency despite   medication, and he fell, struck his head, and imaging studies revealed   a thin rim subdural over the right convexity. Throughout the day while   he has been here in the emergency room, his mental status has not   improved, and in fact at times waxes and wanes. A repeat CT scan   was performed, although this does not show really an increase in size   of the hematoma, there is some mild increase in swelling of the brain   underneath the hematoma.    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 The mother is not a very good   historian. He has had renal surgery and a craniotomy as noted above.    SOCIAL HISTORY: He is a former smoker; does not use alcohol.    MEDICATIONS AT HOME   1. Zoloft.   2. Zantac.   3. Ativan for breakthrough seizures.   4. Multivitamin.   5. Effexor.   6. Zofran.   7. Keppra.   8. Vimpat.    REVIEW OF SYSTEMS: No other GI, GU, respiratory, cardiac    complaints. The surgery on the brain tumor apparently left him with a   left hemiparesis.    PHYSICAL EXAMINATION   NEUROLOGIC: He is awake. He does not follow commands, although   he does respond to voice, looks in the direction of the spoken voice.   Pupils seem equal and reactive. He has facial droop on the left, but his   mother tells me that is old. He is weak in the left upper and lower   extremity, that also is old. Pulse rate 110, respiratory rate 20, SpO2   99%. There is a well-healed scar over   the right parieto-occipital region of the skull. Gait and station are   unable to be tested.    IMAGING STUDIES: Include 2 CT scans that were done earlier today,   shows a thin rim subdural hematoma with some swelling in the right   parieto-occipital region, tiny amount of mass effect with shift of midline   over a couple of millimeters. The basal cisterns are otherwise open.   There is no hydrocephalus.    In short, there is no neurosurgical intervention necessary. I  discussed   this at length with the hospitalist and the emergency room physician,   and we came to the conclusion that he would be best served if he were   transferred to the intensive care unit at [facility] because of   the multiple medical problems as well as the acute problem. I can   follow him there, but again, there is no acute surgical intervention   necessary.Marland Kitchen He needs to remain obviously on his   antiseizure medications. I would not start any diuretics in the form of,   for example, Mannitol, at this time, but would use that judiciously if   necessary. The patient will be transferred to the hospitalist service,   and will follow there.        Carl Dienes, MD      Carl Howell / Carl Howell   D:  07/30/2016   17:02   T:  07/30/2016   18:31   Job #:  [id number]

## 2016-07-30 NOTE — ED Notes (Signed)
Pt to be [facility] by ambulance [facility].

## 2016-07-30 NOTE — ED Notes (Signed)
MD Marguerita BeardsH. Wong at bedside update patient and mother on plan of care.

## 2016-07-30 NOTE — ED Notes (Signed)
[facility] dispatch contacted to find out actual ETA of ambulance transport for pt to [facility].  Advised that the crew is in the driveway.

## 2016-07-30 NOTE — ED Notes (Signed)
Report called to [facility] ICU, Marcelino Duster and [facility] called for transport.

## 2016-07-30 NOTE — Discharge Summary (Signed)
Hospitalist Discharge Summary     Patient ID:  Carl Howell  [id number]  40 y.o.  April 27, 1976    PCP on record: Tommi Rumps, MD    Admit date: 07/30/2016  Transfer  date and time:07/30/16         ______________________________________________________________________  DISCHARGE SUMMARY/HOSPITAL COURSE:  for full details see H&P, daily progress notes, labs, consult notes.      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 Per mother at bedside, the patient used to obtain all his care at   [facility]; however, his insurance changed and [facility] no longer accepts   [place], and the patient is now getting all his care at [facility]. Per   mother, around 9:00 this morning, the patient fell down the stairs about   8 feet in height and she found him seizing for about 5 minutes,   including tonic-clonic movements and tongue biting. Per mother, the   seizures are not unusual. He takes Vimpat and Keppra and takes all   his medications as prescribed. He usually has a prolonged postictal   state of several hours, 8 to 9 hours, but today it seems to be more   prolonged than usual. Immediately after the fall, the patient was   transferred to  where a CT scan of the head revealed a right subdural   hematoma measuring up to 5 mm in thickness with a slight mass effect   and leftward midline shift of 3 mm. The patient was evaluated by   Neurosurgery and transferred to [facility] for further care. No history   is obtainable from the patient due to confusion and postictal state.   History was obtained from chart review and the patient's mother, with   whom he lives.          Acute right subdural hematoma measuring up to 5 mm in thickness. No significant   midline shift or mass effect. No acute hydrocephalus.   due to fall  2to? sz  Ct neck There is no acute fracture or subluxation.  NS consulted   Also repeat CT head in 24 hours or prn depending neuro status    Monitor H/h and platelets   @ 340 pm pt vomited    Patient to be upgraded to ICU and stat CT of head was done-  Stable thickness of an acute right subdural hematoma. However, there is mildly  increased underlying mass effect, effacement of the right lateral ventricle, and  new leftward midline shift of 3 mm.  Ns was informed by the er  Pt sats 98 ra- still protecting  airway-will hold intubation-  Er aware that he may need intubation if status changes prior to tx to icu.  ??icu was also consulted   Check fs, change meds to iv  ??  seizures 2 to brain surgery  neurology consulted   Ativan prn  Home meds-keppra/vimpat   Check levels   sz precautions  Check ua tox  ??   Hyponatremia  ivf   Am labs  ??  Depression-   home meds hold meds for now  ??  Code Status:  FULL code   Surrogate Decision Maker:  Mother  DVT Prophylaxis: SCds   GI  Prophylaxis: home meds  ??  ??        _______________________________________________________________________  Patient seen and examined by me on discharge day.  Pertinent Findings:  Gen:    Not in distress  Chest: Clear lungs  CVS:   Regular rhythm.  No edema  Abd:  Soft, not distended, not tender  Neuro:  Alert,   _______________________________________________________________________  DISCHARGE MEDICATIONS:   Discharge Medication List as of 07/30/2016 10:52 PM          My Recommended Diet, Activity, Wound Care, and follow-up labs are listed in the patient's Discharge Insturctions which I have personally completed and reviewed.    _______________________________________________________________________  DISPOSITION:    Home with Family:    Home with HH/PT/OT/RN:    SNF/LTC:    SA[facility]:    OTHER: x       Condition at Discharge:  Stable   _______________________________________________________________________  Follow up with:   PCP : Tommi Rumpsasha Dickerson, MD  Follow-up Information     None              Total time in minutes spent coordinating this discharge (includes going over instructions, follow-up, prescriptions, and preparing report for sign off to her PCP) :  30  minutes    Signed:  Hinda LenisEsteban A. Tarek Cravens, MD

## 2016-07-30 NOTE — Other (Signed)
2330: TRANSFER - IN REPORT:    Verbal report received from [facility] RN(name) on Anshul Billard  being received from [facility] ED(unit) for change in patient condition(neuro patient)      Report consisted of patient???s Situation, Background, Assessment and   Recommendations(SBAR).     Information from the following report(s) SBAR, Kardex and ED Summary was reviewed with the receiving nurse.    Opportunity for questions and clarification was provided.      Assessment completed upon patient???s arrival to unit and care assumed.

## 2016-07-30 NOTE — ED Notes (Signed)
Patient asleep and calm on the stretcher with sitter at bedside at this time

## 2016-07-30 NOTE — ED Notes (Signed)
Pt to be transferred to [facility]- nursing supervisor notified

## 2016-07-30 NOTE — ED Triage Notes (Signed)
Patient presents to ED via EMS on backboard and c-collar s/p fall down stairs approximately 8 feet.  Patient is unaware if he had +LOC or a seizure

## 2016-07-30 NOTE — ED Notes (Signed)
Patient remains asleep on stretcher with mother at bedside

## 2016-07-30 NOTE — ED Notes (Signed)
While giving bedside report, patient actively vomiting at this time. MD Ginnie Smart made aware. Patient to be upgraded to ICU and stat CT of brain ordered. Patient taken to CT at this time by Primary RN Judeth Cornfield K. Mother at bedside and updated on plan of care.

## 2016-07-30 NOTE — H&P (Addendum)
Hospitalist Admission Note    NAME: Carl Howell   DOB:  Apr 10, 1976   MRN:  [id number]     Date/Time:  07/30/2016 1:28 PM    Patient PCP: PROVIDER UNKNOWN        ________________________________________________________________________    My assessment of this patient's clinical condition and my plan of care is as follows.    Assessment / Plan:    Acute right subdural hematoma measuring up to 5 mm in thickness. No significant  midline shift or mass effect. No acute hydrocephalus.   due to fall  2to? sz  Ct neck There is no acute fracture or subluxation.  NS consulted   Also repeat CT head in 24 hours or prn depending neuro status    Monitor H/h and platelets   @ 340 pm pt vomited    Patient to be upgraded to ICU and stat CT of head was done-  Stable thickness of an acute right subdural hematoma. However, there is mildly  increased underlying mass effect, effacement of the right lateral ventricle, and  new leftward midline shift of 3 mm.  Ns was informed by the er  Pt sats 98 ra- still protecting  airway-will hold intubation-  Er aware that he may need intubation if status changes prior to tx to icu.  ??icu was also consulted   Check fs, change meds to iv    seizures 2 to brain surgery  neurology consulted   Ativan prn  Home meds-keppra/vimpat   Check levels   sz precautions  Check ua tox     Hyponatremia  ivf   Am labs    Depression-   home meds hold meds for now    Code Status:  FULL code   Surrogate Decision Maker:  Mother  DVT Prophylaxis: SCds   GI Prophylaxis: home meds                  Subjective:   CHIEF COMPLAINT: fall/seizures     HISTORY OF PRESENT ILLNESS:     Carl Howell is a 40 y.o.  Male h/o stage brain ca  c/p resection and crt in 2008,h/o sz dx seizure free the past year.  Now ,in the er after  fall at home after having a witnessed sz mother found pt at home w sz activity no reports fevers,  No recent illnessess no reports of c/p or sob, no fevers or chills, no h/o drug use or etoh use.  Takes meds regularly. Most of the info obatined from records and mother.    We were asked to admit for work up and evaluation of the above problems.     Past Medical History:   Diagnosis Date   ??? Cancer of brain ([place])    ??? Epilepsy ([place]) unknown        Past Surgical History:   Procedure Laterality Date   ??? BRAIN AVM SURG,DURAL,COMPLX      brain surgery - unknown had brain tumor   ??? KIDNEY DIALYSATE DELIVRY SYS      kidney surgery - unknown   ??? NEUROLOGICAL PROCEDURE UNLISTED      craniotomy       Social History   Substance Use Topics   ??? Smoking status: Former Smoker     Packs/day: 0.25     Types: Cigarettes   ??? Smokeless tobacco: Never Used   ??? Alcohol use No        History reviewed. No pertinent family history.  No Known  Allergies     Prior to Admission medications    Medication Sig Start Date End Date Taking? Authorizing Provider   sertraline (ZOLOFT) 100 mg tablet Take 100 mg by mouth daily.   Yes Phys Other, MD   raNITIdine (ZANTAC) 150 mg tablet Take 150 mg by mouth two (2) times a day.   Yes Phys Other, MD   LORazepam (ATIVAN) 1 mg tablet Take 1 mg by mouth every four (4) hours as needed for Anxiety.   Yes Phys Other, MD   DANDELION PO Take 520 mg by mouth daily.   Yes Phys Other, MD   ASHWAGANDHA ROOT EXTRACT,BULK, 1 Tab by Does Not Apply route.   Yes Phys Other, MD   VENLAFAXINE HCL (EFFEXOR XR PO) Take 150 mg by mouth.   Yes Phys Other, MD   ondansetron (ZOFRAN ODT) 4 mg disintegrating tablet Take 1 Tab by mouth every eight (8) hours as needed for Nausea. 04/16/15  Yes Gearldine Bienenstock, DO   levetiracetam (KEPPRA) 500 mg tablet TAKE 4 TABLETS BY MOUTH IN THE MORNING AND 3 TABLETS IN THE EVENING. SAME GENERIC FOR [facility] REFILL  Patient taking differently: 1,750 mg two (2) times a day. TAKE 4 TABLETS BY MOUTH IN THE MORNING AND 3 TABLETS IN THE EVENING. SAME GENERIC FOR EACH REFILL 08/05/13  Yes Bailey Mech, NP   lacosamide (VIMPAT) 100 mg tab tablet Take 1 tablet by mouth two (2) times a day.   Patient taking differently: Take 150 mg by mouth two (2) times a day. 08/05/13  Yes Bailey Mech, NP       REVIEW OF SYSTEMS:     I am not able to complete the review of systems because:   The patient is intubated and sedated   x The patient has altered mental status due to his acute medical problems    The patient has baseline aphasia from prior stroke(s)    The patient has baseline dementia and is not reliable historian    The patient is in acute medical distress and unable to provide information           Objective:   VITALS:    Visit Vitals   ??? BP 103/89 (BP 1 Location: Left arm, BP Patient Position: At rest)   ??? Pulse (!) 110   ??? Resp 20   ??? SpO2 99%       PHYSICAL EXAM:    General:    Alert, cooperative x 1  no distress, appears older than stated age.   No collar noted.  HEENT: Atraumatic, anicteric sclerae, pink conjunctivae     No oral ulcers, mucosa moist, throat clear, dentition fair  Neck:  Supple, symmetrical,  thyroid: +  Tender no step offs  Lungs:   Clear to auscultation bilaterally.  No Wheezing or Rhonchi. No rales.  Chest wall:  No tenderness  No Accessory muscle use.  Heart:   Regular  rhythm,  No  murmur   No edema  Abdomen:   Soft, non-tender. Not distended.  Bowel sounds normal  Extremities: No cyanosis.  No clubbing,      Skin turgor normal, Capillary refill normal, Radial dial pulse 2+  Skin:     Not pale.  Not Jaundiced  No rashes well healed scar noted in scalp abrasion rt temple area  Psych:  Good insight.  Not depressed.  Not anxious or agitated.  Neurologic: EOMs intact. No facial asymmetry. No aphasia or slurred speech. Strength bit weaker  in rt side, post ictal. Answers to name only   _______________________________________________________________________  Care Plan discussed with:    Comments   Patient x    Family  x    RN x    Care Manager                    Consultant:  x er   _______________________________________________________________________  Expected  Disposition:    Home with Family    HH/PT/OT/RN    SNF/LTC    SA[facility]    ________________________________________________________________________  TOTAL TIME:  27 Minutes    Critical Care Provided     Minutes non procedure based      Comments    x Reviewed previous records   >50% of visit spent in counseling and coordination of care x Discussion with patient and/or family and questions answered       ________________________________________________________________________  Signed: Eldridge Dace, MD    Procedures: see electronic medical records for all procedures/Xrays and details which were not copied into this note but were reviewed prior to creation of Plan.    LAB DATA REVIEWED:    Recent Results (from the past 24 hour(s))   GLUCOSE, POC    Collection Time: 07/30/16 10:13 AM   Result Value Ref Range    Glucose (POC) 119 (H) 65 - 100 mg/dL    Performed by Beverely Risen    METABOLIC PANEL, BASIC    Collection Time: 07/30/16 10:37 AM   Result Value Ref Range    Sodium 133 (L) 136 - 145 mmol/L    Potassium 3.5 3.5 - 5.1 mmol/L    Chloride 99 97 - 108 mmol/L    CO2 25 21 - 32 mmol/L    Anion gap 9 5 - 15 mmol/L    Glucose 114 (H) 65 - 100 mg/dL    BUN 5 (L) 6 - 20 MG/DL    Creatinine 1.00 0.70 - 1.30 MG/DL    BUN/Creatinine ratio 5 (L) 12 - 20      GFR est AA >60 >60 ml/min/1.7m    GFR est non-AA >60 >60 ml/min/1.761m   Calcium 8.6 8.5 - 10.1 MG/DL   HEPATIC FUNCTION PANEL    Collection Time: 07/30/16 10:37 AM   Result Value Ref Range    Protein, total 8.0 6.4 - 8.2 g/dL    Albumin 4.3 3.5 - 5.0 g/dL    Globulin 3.7 2.0 - 4.0 g/dL    A-G Ratio 1.2 1.1 - 2.2      Bilirubin, total 0.6 0.2 - 1.0 MG/DL    Bilirubin, direct 0.1 0.0 - 0.2 MG/DL    Alk. phosphatase 81 45 - 117 U/L    AST (SGOT) 24 15 - 37 U/L    ALT (SGPT) 25 12 - 78 U/L   MAGNESIUM    Collection Time: 07/30/16 10:37 AM   Result Value Ref Range    Magnesium 2.2 1.6 - 2.4 mg/dL   CBC WITH AUTOMATED DIFF    Collection Time: 07/30/16 10:37 AM   Result Value Ref Range     WBC 10.7 4.1 - 11.1 K/uL    RBC 4.57 4.10 - 5.70 M/uL    HGB 14.3 12.1 - 17.0 g/dL    HCT 41.8 36.6 - 50.3 %    MCV 91.5 80.0 - 99.0 FL    MCH 31.3 26.0 - 34.0 PG    MCHC 34.2 30.0 - 36.5 g/dL    RDW 12.6 11.5 -  14.5 %    PLATELET 303 150 - 400 K/uL    NEUTROPHILS 77 (H) 32 - 75 %    LYMPHOCYTES 16 12 - 49 %    MONOCYTES 6 5 - 13 %    EOSINOPHILS 1 0 - 7 %    BASOPHILS 0 0 - 1 %    ABS. NEUTROPHILS 8.3 (H) 1.8 - 8.0 K/UL    ABS. LYMPHOCYTES 1.7 0.8 - 3.5 K/UL    ABS. MONOCYTES 0.6 0.0 - 1.0 K/UL    ABS. EOSINOPHILS 0.1 0.0 - 0.4 K/UL    ABS. BASOPHILS 0.0 0.0 - 0.1 K/UL   ETHYL ALCOHOL    Collection Time: 07/30/16 10:37 AM   Result Value Ref Range    ALCOHOL(ETHYL),SERUM <10 <10 MG/DL   EKG, 12 LEAD, INITIAL    Collection Time: 07/30/16 11:38 AM   Result Value Ref Range    Ventricular Rate 107 BPM    Atrial Rate 107 BPM    P-R Interval 170 ms    QRS Duration 90 ms    Q-T Interval 340 ms    QTC Calculation (Bezet) 453 ms    Calculated P Axis 87 degrees    Calculated R Axis 63 degrees    Calculated T Axis 62 degrees    Diagnosis       Sinus tachycardia  Possible Left atrial enlargement  Borderline ECG  When compared with ECG of 06-Dec-2012 13:42,  Incomplete right bundle branch block is no longer present  Criteria for Septal infarct are no longer present

## 2016-07-30 NOTE — ED Notes (Signed)
Contacted Dr Bryan LemmaAshwell, no receiving physician yet -- will call back

## 2016-07-30 NOTE — Progress Notes (Signed)
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 Pt [facility] stay [facility]  Will follow as needed once transferred

## 2016-07-30 NOTE — Progress Notes (Signed)
Spiritual Care Assessment/Progress Notes    Carl Howell [id number]  [id number]    05/26/1976  40 y.o.  male    Patient Telephone Number: [phone] (home)   Religious Affiliation: No religion   Language: English   Extended Emergency Contact Information  Primary Emergency Contact: [facility] [place]  Home Phone: [phone]  Relation: Parent   Patient Active Problem List    Diagnosis Date Noted   ??? Seizures (HCC) 07/30/2016   ??? Epilepsy (HCC)         Date: 07/30/2016       Level of Religious/Spiritual Activity:           Involved in faith tradition/spiritual practice             Not involved in faith tradition/spiritual practice           Spiritually oriented             Claims no spiritual orientation             seeking spiritual identity           Feels alienated from religious practice/tradition           Feels angry about religious practice/tradition           Spirituality/religious tradition is a Theatre stage manager for coping at this time.           Not able to assess due to medical condition    Services Provided Today:           crisis intervention             reading Scriptures           spiritual assessment             prayer           empathic listening/emotional support           rites and rituals (cite in comments)           life review              religious support           theological development             advocacy           ethical dialog              blessing           bereavement support             support to family           anticipatory grief support            help with AMD           spiritual guidance             meditation      Spiritual Care Needs           Emotional Support           Spiritual/Religious Care           Loss/Adjustment           Advocacy/Referral                /Ethics           No needs expressed at               this time  Other: (note in               comments)  Spiritual Care Plan           Follow up visits with               pt/family            Provide materials           Schedule sacraments           Contact Community               Clergy           Follow up as needed           Other: (note in               comments)     Comments:   Responded to staff request to offer family support in ER.  Patient's mother, Davene Costain was at bedside.  Provided ministry of calming presence, empathic listening and emotional support as Carl Howell shared events of the day.  She also shared that he has been battling cancer also.  Her other son lives in [place], she has a daughter in [place], and Carl Howell's  other daughter died when she was 27 yrs old.  Carl Howell finds comfort and strength in prayer.  Offered prayer at bedside.  August Luz of chaplain availability.  Barrie Lyme, MPS, [facility], Lead Chaplain  [facility] Paging Service  [phone])

## 2016-07-30 NOTE — ED Notes (Signed)
Patient taken to CT via stretcher by this RN

## 2016-07-30 NOTE — Progress Notes (Signed)
I was called by the ER MD. I reviewed the CT of the head  Pt apparently is at his baseline, awake, and the thin rim SDH does not require immediate surgery. Would have him admitted, I will see later this afternoon while rounding  Recommend repeat scan in AM tomorrow, sooner if condition deteriorates. Also would get neurology consult if he is having breakthrough seizures despite his medication

## 2016-07-30 NOTE — Progress Notes (Signed)
4:02 PM  Called to room by nursing due to concern for worsening mental status head CT previously ordered by Dr. Ginnie SmartMarquez shows mass effect associated with subdural hematoma with leftward shift of 3 mm. Pt is somnolent but arrousable, however, he recently received IV Ativan. Vital signs are stable. No indication for intubation at this time. Nursing notifying Dr. Harmon PierMarqez. Dr. Tressa BusmanJames Melisi paged due to CT results.  Written by Isac CaddyWilson E Yang, ED Scribe, as dictated by Overton MamKatherine L Akiba Melfi, MD.    4:55 PM  Dr. Bonnell PublicMelisi called back, and would like the pt [facility]. He agrees that pt does not currently need intubation.    CONSULT NOTE:  6:16 PM  Overton MamKatherine L Advay Volante, MD spoke with Dr. Seward CarolManea  Specialty: Hospitalist  Discussed pt's hx, disposition, and available diagnostic and imaging results. Reviewed care plans. Consultant agrees with plans as outlined. Dr. Seward CarolManea [facility]  Written by Isac CaddyWilson E Yang, ED Scribe, as dictated by Overton MamKatherine L Makalya Nave, MD.    CRITICAL CARE NOTE:    6:41 PM      IMPENDING DETERIORATION -CNS    ASSOCIATED RISK FACTORS - Subdural hematoma, Altered Mental Status    MANAGEMENT- Bedside Assessment, Supervision of Care and Transfer    INTERPRETATION -  CT Scan and vital signs    INTERVENTIONS - transfer to other facility for neurosurgical evaulation    CASE REVIEW - Hospitalist, Medical Sub-Specialist, Nursing and Family    TREATMENT RESPONSE -Unchanged     PERFORMED BY - Self      NOTES   :      I have spent 30 minutes of critical care time involved in lab review, consultations with specialist, family decision- making, bedside attention and documentation. During this entire length of time I was immediately available to the patient .    Overton MamKatherine L Carsten Carstarphen, MD    Transfer Note:  6:16 PM  [facility] transfer is accepted by Dr. Seward CarolManea. The reasons for their transfer have been discussed with them and available  family. They convey agreement and understanding of the need to be transferred as explained to them by Overton MamKatherine L Rehana Uncapher, MD.    This note is prepared by Isac CaddyWilson E Yang, acting as Scribe for Overton MamKatherine L Sevanna Ballengee, MD      Overton MamKatherine L Kanchan Gal, MD: The scribe's documentation has been prepared under my direction and personally reviewed by me in its entirety. I confirm that the note above accurately reflects all work, treatment, procedures, and medical decision making performed by me.

## 2016-07-30 NOTE — ED Provider Notes (Addendum)
Patient is a 40 y.o. male presenting with fall and seizures. The history is provided by the patient and the EMS personnel.   Fall   The accident occurred 1 to 2 hours ago. The fall occurred while standing. He fell from a height of 6 - 10 ft. The point of impact was the head. The pain is present in the head. The pain is at a severity of 6/10. The pain is moderate. He was not ambulatory at the scene. There was no entrapment after the fall. There was no drug use involved in the accident. There was no alcohol use involved in the accident. Associated symptoms include headaches and loss of consciousness. Pertinent negatives include no visual change, no numbness, no abdominal pain, no nausea, no vomiting, no extremity weakness, no tingling and no laceration. The symptoms are aggravated by activity. Treatment on scene includes a c-collar and a backboard. He has tried nothing for the symptoms. It is unknown when the patient last had a tetanus shot.   Seizure    This is a recurrent problem. The current episode started 1 to 2 hours ago. The most recent episode lasted 2 to 5 minutes. Associated symptoms include headaches. Pertinent negatives include no nausea and no vomiting. Characteristics include rhythmic jerking and loss of consciousness. The episode was witnessed.  The seizures did not continue in the ED. Possible causes do not include sleep deprivation, missed seizure meds, recent illness or missed seizure meds. There has been no fever.  He reports headaches.  He reports no vomiting.      Carl Howell is a 40 y.o. 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  Per mom at bedside, pt was found at bottom of stairwell (approx 8 feet in height) with sz like activity for 5 mins including tonic-clonic movement and tongue biting.  This resolved spontaneously without intervention.  Mom states he has been  compliant with meds.  He is followed by Dr. Amelia Jo at [facility] for seizure disorder and there have been no changes to his medications for over two years.  His last seizure prior today was in July.  No recent illnessess.  Pt brought in via EMS with C-collar and backboard.  Pt currently c/o headache and back pain and is somewhat agitated.  Denies chest pain, SOB, abd pain, or other complaints.  Denies recent drug use.        Past Medical History:   Diagnosis Date   ??? Cancer of brain ([place])    ??? Epilepsy ([place]) unknown       Past Surgical History:   Procedure Laterality Date   ??? BRAIN AVM SURG,DURAL,COMPLX      brain surgery - unknown had brain tumor   ??? KIDNEY DIALYSATE DELIVRY SYS      kidney surgery - unknown   ??? NEUROLOGICAL PROCEDURE UNLISTED      craniotomy         History reviewed. No pertinent family history.    Social History     Social History   ??? Marital status: MARRIED     Spouse name: N/A   ??? Number of children: N/A   ??? Years of education: N/A     Occupational History   ??? Not on file.     Social History Main Topics   ??? Smoking status: Former Smoker     Packs/day: 0.25  Types: Cigarettes   ??? Smokeless tobacco: Never Used   ??? Alcohol use No   ??? Drug use: Yes     Special: Marijuana   ??? Sexual activity: Not on file     Other Topics Concern   ??? Not on file     Social History Narrative         ALLERGIES: Review of patient's allergies indicates no known allergies.    Review of Systems   Gastrointestinal: Negative for abdominal pain, nausea and vomiting.   Musculoskeletal: Negative for extremity weakness.   Neurological: Positive for loss of consciousness and headaches. Negative for tingling and numbness.       Vitals:    07/30/16 0954 07/30/16 0956 07/30/16 1000   BP: (!) 135/104  (!) 150/94   Pulse:  80 84   Resp:  17 20   SpO2: 99% 99% 98%            Physical Exam   Constitutional: He appears well-developed.   HENT:   Right Ear: External ear normal.   Left Ear: External ear normal.    Nose: Nose normal.   Mouth/Throat: Oropharynx is clear and moist.   Prior craniotomy scar, healed  Abrasion to right temple; boggy mass to right occipital region   Eyes: Pupils are equal, round, and reactive to light.   Neck:   C-collar in place   Cardiovascular: Normal rate, normal heart sounds and intact distal pulses.  Exam reveals no gallop.    No murmur heard.  Pulmonary/Chest: Effort normal. No respiratory distress. He has no wheezes. He has no rales.   Abdominal: Soft. He exhibits no distension. There is no tenderness. There is no rebound and no guarding.   Genitourinary: Rectal exam shows guaiac negative stool. No penile tenderness.   Musculoskeletal: He exhibits tenderness.   TTP T and L-spine, no stepoffs   Neurological: He displays normal reflexes. He exhibits normal muscle tone.   Awake, opens eyes spontaneously, disoriented conversation, localizes to pain; GCS 13.  Mild left facial droop and 4/5 LUE and LLE compared to 5/5 on RUE/RLE which is baseline per mom from prior brain tumor.     Skin: Skin is warm. No laceration noted.        Physical Exam   Constitutional: He appears well-developed.   HENT:   Right Ear: External ear normal.   Left Ear: External ear normal.   Nose: Nose normal.   Mouth/Throat: Oropharynx is clear and moist.   Prior craniotomy scar, healed  Abrasion to right temple; boggy mass to right occipital region   Eyes: Pupils are equal, round, and reactive to light.   Neck:   C-collar in place   Cardiovascular: Normal rate, normal heart sounds and intact distal pulses.  Exam reveals no gallop.    No murmur heard.  Pulmonary/Chest: Effort normal. No respiratory distress. He has no wheezes. He has no rales.   Abdominal: Soft. He exhibits no distension. There is no tenderness. There is no rebound and no guarding.   Genitourinary: Rectal exam shows guaiac negative stool. No penile tenderness.   Musculoskeletal: He exhibits tenderness.   TTP T and L-spine, no stepoffs    Neurological: He displays normal reflexes. He exhibits normal muscle tone.   Awake, opens eyes spontaneously, disoriented conversation, localizes to pain; GCS 13.  Mild left facial droop and 4/5 LUE and LLE compared to 5/5 on RUE/RLE which is baseline per mom from prior brain tumor.     Skin:  Skin is warm. No laceration noted.     Recent Results (from the past 24 hour(s))   GLUCOSE, POC    Collection Time: 07/30/16 10:13 AM   Result Value Ref Range    Glucose (POC) 119 (H) 65 - 100 mg/dL    Performed by Beverely Risen    METABOLIC PANEL, BASIC    Collection Time: 07/30/16 10:37 AM   Result Value Ref Range    Sodium 133 (L) 136 - 145 mmol/L    Potassium 3.5 3.5 - 5.1 mmol/L    Chloride 99 97 - 108 mmol/L    CO2 25 21 - 32 mmol/L    Anion gap 9 5 - 15 mmol/L    Glucose 114 (H) 65 - 100 mg/dL    BUN 5 (L) 6 - 20 MG/DL    Creatinine 1.00 0.70 - 1.30 MG/DL    BUN/Creatinine ratio 5 (L) 12 - 20      GFR est AA >60 >60 ml/min/1.60m    GFR est non-AA >60 >60 ml/min/1.765m   Calcium 8.6 8.5 - 10.1 MG/DL   HEPATIC FUNCTION PANEL    Collection Time: 07/30/16 10:37 AM   Result Value Ref Range    Protein, total 8.0 6.4 - 8.2 g/dL    Albumin 4.3 3.5 - 5.0 g/dL    Globulin 3.7 2.0 - 4.0 g/dL    A-G Ratio 1.2 1.1 - 2.2      Bilirubin, total 0.6 0.2 - 1.0 MG/DL    Bilirubin, direct 0.1 0.0 - 0.2 MG/DL    Alk. phosphatase 81 45 - 117 U/L    AST (SGOT) 24 15 - 37 U/L    ALT (SGPT) 25 12 - 78 U/L   MAGNESIUM    Collection Time: 07/30/16 10:37 AM   Result Value Ref Range    Magnesium 2.2 1.6 - 2.4 mg/dL   CBC WITH AUTOMATED DIFF    Collection Time: 07/30/16 10:37 AM   Result Value Ref Range    WBC 10.7 4.1 - 11.1 K/uL    RBC 4.57 4.10 - 5.70 M/uL    HGB 14.3 12.1 - 17.0 g/dL    HCT 41.8 36.6 - 50.3 %    MCV 91.5 80.0 - 99.0 FL    MCH 31.3 26.0 - 34.0 PG    MCHC 34.2 30.0 - 36.5 g/dL    RDW 12.6 11.5 - 14.5 %    PLATELET 303 150 - 400 K/uL    NEUTROPHILS 77 (H) 32 - 75 %    LYMPHOCYTES 16 12 - 49 %    MONOCYTES 6 5 - 13 %     EOSINOPHILS 1 0 - 7 %    BASOPHILS 0 0 - 1 %    ABS. NEUTROPHILS 8.3 (H) 1.8 - 8.0 K/UL    ABS. LYMPHOCYTES 1.7 0.8 - 3.5 K/UL    ABS. MONOCYTES 0.6 0.0 - 1.0 K/UL    ABS. EOSINOPHILS 0.1 0.0 - 0.4 K/UL    ABS. BASOPHILS 0.0 0.0 - 0.1 K/UL   ETHYL ALCOHOL    Collection Time: 07/30/16 10:37 AM   Result Value Ref Range    ALCOHOL(ETHYL),SERUM <10 <10 MG/DL   EKG, 12 LEAD, INITIAL    Collection Time: 07/30/16 11:38 AM   Result Value Ref Range    Ventricular Rate 107 BPM    Atrial Rate 107 BPM    P-R Interval 170 ms    QRS Duration 90 ms    Q-T Interval 340  ms    QTC Calculation (Bezet) 453 ms    Calculated P Axis 87 degrees    Calculated R Axis 63 degrees    Calculated T Axis 62 degrees    Diagnosis       Sinus tachycardia  Possible Left atrial enlargement  Borderline ECG  When compared with ECG of 06-Dec-2012 13:42,  Incomplete right bundle branch block is no longer present  Criteria for Septal infarct are no longer present     MDM  Number of Diagnoses or Management Options  Fall, initial encounter: established and improving  Seizure disorder [facility]): established and improving  Subdural hematoma ([place]): new and requires workup     Amount and/or Complexity of Data Reviewed  Clinical lab tests: ordered  Tests in the radiology section of CPT??: ordered  Tests in the medicine section of CPT??: ordered    Risk of Complications, Morbidity, and/or Mortality  Presenting problems: moderate  Management options: low    Critical Care  Total time providing critical care: 30-74 minutes    Patient Progress  Patient progress: stable    CT Results  (Last 48 hours)               07/30/16 1128  CT HEAD WO CONT Final result    Impression:  IMPRESSION:        Acute right subdural hematoma measuring up to 5 mm in thickness. No significant   midline shift or mass effect. No acute hydrocephalus.       The findings were discussed with Dr. Jacelyn Grip on 07/30/2016 at 1145 hours by Dr.   Hollace Kinnier.       789            Narrative:  EXAM:  CT head without contrast       INDICATION: Fall down steps. Possible seizure.       COMPARISON: MR brain 06/12/2009.       TECHNIQUE: Noncontrast head CT. Coronal and sagittal reformats. CT dose   reduction was achieved through use of a standardized protocol tailored for this   examination and automatic exposure control for dose modulation.       FINDINGS:    Surgical changes are again seen following a right posterior craniotomy with   underlying encephalomalacia. There is an acute right subdural hematoma measuring   up to 5 mm in thickness. There is no significant underlying midline shift or   mass effect.       The ventricles are stable in size and configuration without hydrocephalus. The   basal cisterns are patent.       There is a extracalvarial posterior soft tissue hematoma. The visualized   paranasal sinuses and mastoid air cells are clear.           07/30/16 1128  CT SPINE CERV WO CONT Final result    Impression:  IMPRESSION:   There is no acute fracture or subluxation.               Narrative:  EXAM:  CT CERVICAL SPINE WITHOUT CONTRAST       INDICATION: Fall down stairs. Possible seizure.       COMPARISON: None.       TECHNIQUE: Helical CT of the cervical spine was performed without contrast.    Sagittal and coronal reconstructions were obtained. CT dose reduction was   achieved through use of a standardized protocol tailored for this examination   and automatic exposure control for dose modulation.  CONTRAST: None.       FINDINGS:       Alignment is within normal limits. There is no fracture or subluxation. Odontoid   process is intact. The craniocervical junction is within normal limits.   Prevertebral soft tissues are within normal limits.       There is no spinal canal or foraminal stenosis.                   ED Course     MDM DDX: ICH vs SDH vs fracture vs concussion vs subtherapeutic AED vs breakthrough seizure   40 y.o. M with prior h/o brain cancer s/p resection, known seizure disorder on two AED's p/w via EMS after witnessed seizure with fall; pt is post-ictal; GCS 13, protecting airway; baseline LUE/LLE weakness; given history and mechanism of trauma, will evaluate with CT head, CT C-spine, xrays of spine, check EKG, CXR, check basic labs and UDS; will likely consult NSGY and Neuro pending evaluation.  Update tetanus as status is unknown.    Notified by Radiology of Right SDH 70m; no shift; Dr. MSimonne Maffucci NSGY made aware; recommend q1 neuro checks, admit to hospitalist and he will see patient today.  Spoke to neurology, will add AED levels to be drawn; no further recs at this time.    Pt admitted to hospitalist service; repeat exam 12:35PM currently alert, oriented x 4; c/o back pain; neuro exam unchanged; awaiting plain films of spine although low suspicion for acute injury; VSS at reevaluation.      Procedures     1:36PM  Pt reassessed; slightly agitated, given additional 249mIV ativan.  Currently, calm and sleeping in bed comfortably.  Unable to clear C-collar due to agitation.  Hospitalist made aware.  If becomes more acutely altered or change in neuro exam, will need to consider further imaging.  Pt currently admitted to stepdown at this time, awaiting bed assignment.    EKG reviewed  ED EKG interpretation: sinus tach; no ST changes; normal intervals; QTc WNL      ICD-10-CM ICD-9-CM   1. Subdural hematoma (HCC) I62.00 432.1   2. Fall, initial encounter W19.XXXA E888.9   3. Seizure disorder (HCCollegeG40.909 345.90     Dispo - admitted to Hospitalist service; NSGY and Neuro to consult; stepdown orders placed by Hospitalist.

## 2016-07-30 NOTE — ED Notes (Signed)
Report given to [facility] personnel and pt given 1 mg of ativan due to agitation.

## 2016-07-30 NOTE — ED Notes (Addendum)
Patients mother at bedside and states that patient has a history of brain cancer last treatment of chemotherapy and radiation two years ago. Mother also states that patients behavior is not normal for him. Mother states that patient had a seizure and fell down eight stairs witnessed by his son. Patient remains on seizure precautions.

## 2016-07-30 NOTE — ED Notes (Addendum)
Patient thrashing around on stretcher, kicking and attempting to get out of the stretcher. Patient removed cervical collar himself and ripped off all cardiac electrodes and pulse oximetry. Multiple attempts were made to redirect, calm, and reassure patient without success. Mother at bedside attempting to calm patient without success. MD Modesto CharonWong at beside and patient medicated as ordered.

## 2016-07-30 NOTE — ED Notes (Signed)
Disc available and placed on chart

## 2016-07-30 NOTE — Discharge Summary (Signed)
Discharge Summary by Hinda Lenis., MD at 07/30/16 2251                Author: Hinda Lenis., MD  Service: Internal Medicine  Author Type: Physician       Filed: 08/05/16 1809  Date of Service: 07/30/16 2251  Status: Signed          Editor: Hinda Lenis., MD (Physician)                                       Hospitalist Discharge Summary        Patient ID:   Carl Howell   [id number]   40 y.o.   [birth year]      PCP on record: Tommi Rumps, MD      Admit date: 07/30/2016   Transfer  date and time:07/30/16             ______________________________________________________________________   DISCHARGE SUMMARY/HOSPITAL COURSE:   for full details see H&P, daily progress notes, labs, consult notes.         HISTORY OF PRESENT ILLNESS: The patient is a 40 year old male    with past medical history of grade III glioma in the right parietal    occipital area, status post resection in 2008 with history of seizure    disorder, who had a witnessed fall and seizure this morning around    9:00. Per mother at bedside, the patient used to obtain all his care at    [facility]; however, his insurance changed and [facility] no longer accepts    [place], and the patient is now getting all his care at [facility]. Per    mother, around 9:00 this morning, the patient fell down the stairs about    8 feet in height and she found him seizing for about 5 minutes,    including tonic-clonic movements and tongue biting. Per mother, the    seizures are not unusual. He takes Vimpat and Keppra and takes all    his medications as prescribed. He usually has a prolonged postictal    state of several hours, 8 to 9 hours, but today it seems to be more    prolonged than usual. Immediately after the fall, the patient was    transferred to  where a CT scan of the head revealed a right subdural    hematoma measuring up to 5 mm in thickness with a slight mass effect    and leftward midline shift of 3 mm. The patient was evaluated by    Neurosurgery  and transferred to [facility] for further care. No history    is obtainable from the patient due to confusion and postictal state.    History was obtained from chart review and the patient's mother, with    whom he lives.              Acute right subdural hematoma measuring up to 5 mm in thickness. No significant   midline shift or mass effect. No acute hydrocephalus.    due to fall  2to? sz   Ct neck There is no acute fracture or subluxation.   NS consulted    Also repeat CT head in 24 hours or prn depending neuro status     Monitor H/h and platelets    @ 340 pm pt vomited     Patient to be upgraded to ICU  and stat CT of head was done-   Stable thickness of an acute right subdural hematoma. However, there is mildly   increased underlying mass effect, effacement of the right lateral ventricle, and   new leftward midline shift of 3 mm.   Ns was informed by the er   Pt sats 98 ra- still protecting  airway-will hold intubation-   Er aware that he may need intubation if status changes prior to tx to icu.   ??icu was also consulted    Check fs, change meds to iv   ??   seizures 2 to brain surgery   neurology consulted    Ativan prn   Home meds-keppra/vimpat    Check levels    sz precautions   Check ua tox   ??    Hyponatremia   ivf    Am labs   ??   Depression-    home meds hold meds for now   ??   Code Status:  FULL code    Surrogate Decision Maker:  Mother   DVT Prophylaxis: SCds    GI Prophylaxis: home meds   ??   ??            _______________________________________________________________________   Patient seen and examined by me on discharge day.   Pertinent Findings:   Gen:    Not in distress   Chest: Clear lungs   CVS:   Regular rhythm.  No edema   Abd:  Soft, not distended, not tender   Neuro:  Alert,    _______________________________________________________________________   DISCHARGE MEDICATIONS:      Discharge Medication List as of 07/30/2016 10:52 PM                  My Recommended Diet, Activity, Wound Care, and  follow-up labs are listed in the patient's Discharge Insturctions which I have personally completed and reviewed.      _______________________________________________________________________      DISPOSITION:          Home with Family:          Home with HH/PT/OT/RN:          SNF/LTC:          SAHR:          OTHER:  x           Condition at Discharge:  Stable   _______________________________________________________________________   Follow up with:    PCP : Tommi Rumpsasha Dickerson, MD     Follow-up Information           None                        Total time in minutes spent coordinating this discharge (includes going over instructions, follow-up, prescriptions, and preparing report for sign off to her PCP) :  30  minutes      Signed:   Hinda LenisEsteban A. Eva Griffo, MD

## 2016-07-30 NOTE — Consults (Signed)
[facility]   [address]   [place], [place] [id number]   CONSULTATION       Name:  Carl Howell, Carl Howell   MR#:  [id number]   DOB:  [birth year]   Account #:  [id number]    Date of Consultation:  07/30/2016   Date of Adm:  07/30/2016       CHIEF COMPLAINT: A 40 year old man with a small, acute right-sided   subdural hematoma after a fall as a result of a seizure.    HISTORY OF PRESENT ILLNESS: The patient has a history of a right   parieto-occipital glioma, he was first operated on more than 10 years   ago, again in 2008, [facility] a low-grade glioma. A second operation was for an   operation that involved the transition of that same tumor to a higher   grade tumor. This history is given by the patient's mother, who was at   the bedside. Today he had a seizure. He is on Vimpat and Keppra.   Apparently the seizures have been increasing in frequency despite   medication, and he fell, struck his head, and imaging studies revealed   a thin rim subdural over the right convexity. Throughout the day while   he has been here in the emergency room, his mental status has not   improved, and in fact at times waxes and wanes. A repeat CT scan   was performed, although this does not show really an increase in size   of the hematoma, there is some mild increase in swelling of the brain   underneath the hematoma.    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 The mother is not a very good   historian. He has had renal surgery and a craniotomy as noted above.    SOCIAL HISTORY: He is a former smoker; does not use alcohol.    MEDICATIONS AT HOME   1. Zoloft.   2. Zantac.   3. Ativan for breakthrough seizures.   4. Multivitamin.   5. Effexor.   6. Zofran.   7. Keppra.   8. Vimpat.    REVIEW OF SYSTEMS: No other GI, GU, respiratory, cardiac   complaints. The surgery on the brain tumor  apparently left him with a   left hemiparesis.    PHYSICAL EXAMINATION   NEUROLOGIC: He is awake. He does not follow commands, although   he does respond to voice, looks in the direction of the spoken voice.   Pupils seem equal and reactive. He has facial droop on the left, but his   mother tells me that is old. He is weak in the left upper and lower   extremity, that also is old. Pulse rate 110, respiratory rate 20, SpO2   99%. There is a well-healed scar over   the right parieto-occipital region of the skull. Gait and station are   unable to be tested.    IMAGING STUDIES: Include 2 CT scans that were done earlier today,   shows a thin rim subdural hematoma with some swelling in the right   parieto-occipital region, tiny amount of mass effect with shift of midline   over a couple of millimeters. The basal cisterns are otherwise open.   There is no hydrocephalus.    In short, there is no neurosurgical intervention necessary. I  discussed   this at length with the hospitalist and the emergency room physician,   and we came to the conclusion that he would be best served if he were   transferred to the intensive care unit at [facility] because of   the multiple medical problems as well as the acute problem. I can   follow him there, but again, there is no acute surgical intervention   necessary.Marland Kitchen He needs to remain obviously on his   antiseizure medications. I would not start any diuretics in the form of,   for example, Mannitol, at this time, but would use that judiciously if   necessary. The patient will be transferred to the hospitalist service,   and will follow there.        Barry Dienes, MD      Edmonia Caprio / Dorma Russell   D:  07/30/2016   17:02   T:  07/30/2016   18:31   Job #:  [id number]

## 2016-07-31 ENCOUNTER — Inpatient Hospital Stay

## 2016-07-31 ENCOUNTER — Inpatient Hospital Stay: Payer: PRIVATE HEALTH INSURANCE | Primary: Family Medicine

## 2016-07-31 ENCOUNTER — Inpatient Hospital Stay: Admit: 2016-07-31 | Payer: PRIVATE HEALTH INSURANCE | Primary: Family Medicine

## 2016-07-31 ENCOUNTER — Inpatient Hospital Stay
Admit: 2016-07-31 | Discharge: 2016-08-08 | Disposition: A | Discharge status: Rehabilitation Facility | Payer: PRIVATE HEALTH INSURANCE | Source: Other Acute Inpatient Hospital | Attending: Internal Medicine | Admitting: Internal Medicine

## 2016-07-31 LAB — METABOLIC PANEL, BASIC
Anion gap: 11 mmol/L (ref 5–15)
BUN/Creatinine ratio: 11 — ABNORMAL LOW (ref 12–20)
BUN: 9 MG/DL (ref 6–20)
CO2: 22 mmol/L (ref 21–32)
Calcium: 9.1 MG/DL (ref 8.5–10.1)
Chloride: 102 mmol/L (ref 97–108)
Creatinine: 0.82 MG/DL (ref 0.70–1.30)
GFR est AA: >60 mL/min/{1.73_m2} (ref >60)
GFR est non-AA: >60 mL/min/{1.73_m2} (ref >60)
Glucose: 103 mg/dL — ABNORMAL HIGH (ref 65–100)
Potassium: 3.8 mmol/L (ref 3.5–5.1)
Sodium: 135 mmol/L — ABNORMAL LOW (ref 136–145)

## 2016-07-31 LAB — CBC WITH AUTOMATED DIFF
ABS. BASOPHILS: 0 10*3/uL (ref 0.0–0.1)
ABS. EOSINOPHILS: 0 10*3/uL (ref 0.0–0.4)
ABS. LYMPHOCYTES: 1.2 10*3/uL (ref 0.8–3.5)
ABS. MONOCYTES: 1.1 10*3/uL — ABNORMAL HIGH (ref 0.0–1.0)
ABS. NEUTROPHILS: 12.2 10*3/uL — ABNORMAL HIGH (ref 1.8–8.0)
BASOPHILS: 0 % (ref 0–1)
EOSINOPHILS: 0 % (ref 0–7)
HCT: 36.3 % — ABNORMAL LOW (ref 36.6–50.3)
HGB: 12.4 g/dL (ref 12.1–17.0)
LYMPHOCYTES: 8 % — ABNORMAL LOW (ref 12–49)
MCH: 31.1 PG (ref 26.0–34.0)
MCHC: 34.2 g/dL (ref 30.0–36.5)
MCV: 91 FL (ref 80.0–99.0)
MONOCYTES: 7 % (ref 5–13)
NEUTROPHILS: 85 % — ABNORMAL HIGH (ref 32–75)
PLATELET: 277 10*3/uL (ref 150–400)
RBC: 3.99 M/uL — ABNORMAL LOW (ref 4.10–5.70)
RDW: 12.4 % (ref 11.5–14.5)
WBC: 14.5 10*3/uL — ABNORMAL HIGH (ref 4.1–11.1)

## 2016-07-31 LAB — PHOSPHORUS: Phosphorus: 2.7 MG/DL (ref 2.6–4.7)

## 2016-07-31 LAB — MAGNESIUM: Magnesium: 2 mg/dL (ref 1.6–2.4)

## 2016-07-31 MED ORDER — HYDROMORPHONE (PF) 1 MG/ML IJ SOLN
1 mg/mL | INTRAMUSCULAR | Status: AC
Start: 2016-07-31 — End: 2016-08-01
  Administered 2016-07-31: 18:00:00

## 2016-07-31 MED ORDER — HYDROMORPHONE (PF) 1 MG/ML IJ SOLN
1 mg/mL | Freq: Once | INTRAMUSCULAR | Status: AC
Start: 2016-07-31 — End: 2016-07-31
  Administered 2016-07-31: 17:00:00 via INTRAVENOUS

## 2016-07-31 MED ORDER — LORAZEPAM 2 MG/ML IJ SOLN
2 mg/mL | INTRAMUSCULAR | Status: DC | PRN
Start: 2016-07-31 — End: 2016-08-02
  Administered 2016-07-31 – 2016-08-01 (×7): via INTRAVENOUS

## 2016-07-31 MED ORDER — LACOSAMIDE 50 MG TAB
50 mg | Freq: Two times a day (BID) | ORAL | Status: DC
Start: 2016-07-31 — End: 2016-07-31
  Administered 2016-07-31: 15:00:00 via ORAL

## 2016-07-31 MED ORDER — VENLAFAXINE SR 150 MG 24 HR CAP
150 mg | Freq: Every day | ORAL | Status: DC
Start: 2016-07-31 — End: 2016-08-08
  Administered 2016-07-31 – 2016-08-08 (×7): via ORAL

## 2016-07-31 MED ORDER — LORAZEPAM 2 MG/ML IJ SOLN
2 mg/mL | INTRAMUSCULAR | Status: DC | PRN
Start: 2016-07-31 — End: 2016-08-02
  Administered 2016-07-31 (×3): via INTRAVENOUS

## 2016-07-31 MED ORDER — LORAZEPAM 2 MG/ML IJ SOLN
2 mg/mL | Freq: Once | INTRAMUSCULAR | Status: AC
Start: 2016-07-31 — End: 2016-07-31
  Administered 2016-07-31: 09:00:00 via INTRAVENOUS

## 2016-07-31 MED ORDER — LORAZEPAM 2 MG/ML IJ SOLN
2 mg/mL | Freq: Once | INTRAMUSCULAR | Status: AC
Start: 2016-07-31 — End: 2016-07-31
  Administered 2016-07-31: 17:00:00 via INTRAVENOUS

## 2016-07-31 MED ORDER — CYCLOBENZAPRINE 10 MG TAB
10 mg | Freq: Three times a day (TID) | ORAL | Status: DC
Start: 2016-07-31 — End: 2016-08-02
  Administered 2016-07-31 – 2016-08-02 (×4): via ORAL

## 2016-07-31 MED ORDER — SODIUM CHLORIDE 0.9 % IJ SYRG
Freq: Three times a day (TID) | INTRAMUSCULAR | Status: DC
Start: 2016-07-31 — End: 2016-08-08
  Administered 2016-07-31 – 2016-08-08 (×25): via INTRAVENOUS

## 2016-07-31 MED ORDER — FAMOTIDINE 20 MG TAB
20 mg | Freq: Two times a day (BID) | ORAL | Status: DC
Start: 2016-07-31 — End: 2016-08-08
  Administered 2016-07-31 – 2016-08-08 (×14): via ORAL

## 2016-07-31 MED ORDER — SERTRALINE 50 MG TAB
50 mg | Freq: Every day | ORAL | Status: DC
Start: 2016-07-31 — End: 2016-08-03
  Administered 2016-07-31 – 2016-08-03 (×3): via ORAL

## 2016-07-31 MED ORDER — HYDROMORPHONE (PF) 1 MG/ML IJ SOLN
1 mg/mL | INTRAMUSCULAR | Status: AC
Start: 2016-07-31 — End: 2016-07-31
  Administered 2016-07-31: 17:00:00 via INTRAVENOUS

## 2016-07-31 MED ORDER — ONDANSETRON (PF) 4 MG/2 ML INJECTION
4 mg/2 mL | INTRAMUSCULAR | Status: DC | PRN
Start: 2016-07-31 — End: 2016-08-08
  Administered 2016-07-31 – 2016-08-04 (×4): via INTRAVENOUS

## 2016-07-31 MED ORDER — HYDROMORPHONE (PF) 1 MG/ML IJ SOLN
1 mg/mL | INTRAMUSCULAR | Status: DC | PRN
Start: 2016-07-31 — End: 2016-08-01
  Administered 2016-07-31 – 2016-08-01 (×7): via INTRAVENOUS

## 2016-07-31 MED ORDER — LORAZEPAM 1 MG TAB
1 mg | ORAL | Status: DC | PRN
Start: 2016-07-31 — End: 2016-08-02

## 2016-07-31 MED ORDER — LORAZEPAM 2 MG/ML IJ SOLN
2 mg/mL | Freq: Once | INTRAMUSCULAR | Status: DC
Start: 2016-07-31 — End: 2016-07-31

## 2016-07-31 MED ORDER — MORPHINE 2 MG/ML INJECTION
2 mg/mL | INTRAMUSCULAR | Status: DC | PRN
Start: 2016-07-31 — End: 2016-07-31
  Administered 2016-07-31 (×4): via INTRAVENOUS

## 2016-07-31 MED ORDER — LACOSAMIDE 50 MG TAB
50 mg | Freq: Two times a day (BID) | ORAL | Status: DC
Start: 2016-07-31 — End: 2016-08-02
  Administered 2016-07-31 – 2016-08-01 (×3): via ORAL

## 2016-07-31 MED ORDER — SODIUM CHLORIDE 0.9 % IJ SYRG
INTRAMUSCULAR | Status: DC | PRN
Start: 2016-07-31 — End: 2016-08-08

## 2016-07-31 MED ORDER — ACETAMINOPHEN 325 MG TABLET
325 mg | ORAL | Status: DC | PRN
Start: 2016-07-31 — End: 2016-08-08
  Administered 2016-08-03 – 2016-08-08 (×4): via ORAL

## 2016-07-31 MED ORDER — LEVETIRACETAM 500 MG TAB
500 mg | Freq: Two times a day (BID) | ORAL | Status: DC
Start: 2016-07-31 — End: 2016-08-02
  Administered 2016-07-31 – 2016-08-01 (×4): via ORAL

## 2016-07-31 MED FILL — ONDANSETRON (PF) 4 MG/2 ML INJECTION: 4 mg/2 mL | INTRAMUSCULAR | Qty: 2

## 2016-07-31 MED FILL — HYDROMORPHONE (PF) 1 MG/ML IJ SOLN: 1 mg/mL | INTRAMUSCULAR | Qty: 1

## 2016-07-31 MED FILL — LORAZEPAM 2 MG/ML IJ SOLN: 2 mg/mL | INTRAMUSCULAR | Qty: 1

## 2016-07-31 MED FILL — MORPHINE 2 MG/ML INJECTION: 2 mg/mL | INTRAMUSCULAR | Qty: 1

## 2016-07-31 MED FILL — LORAZEPAM 2 MG/ML IJ SOLN: 2 mg/mL | INTRAMUSCULAR | Qty: 2

## 2016-07-31 MED FILL — CYCLOBENZAPRINE 10 MG TAB: 10 mg | ORAL | Qty: 1

## 2016-07-31 MED FILL — NORMAL SALINE FLUSH 0.9 % INJECTION SYRINGE: INTRAMUSCULAR | Qty: 20

## 2016-07-31 MED FILL — FAMOTIDINE 20 MG TAB: 20 mg | ORAL | Qty: 1

## 2016-07-31 MED FILL — VENLAFAXINE SR 150 MG 24 HR CAP: 150 mg | ORAL | Qty: 1

## 2016-07-31 MED FILL — NORMAL SALINE FLUSH 0.9 % INJECTION SYRINGE: INTRAMUSCULAR | Qty: 10

## 2016-07-31 MED FILL — VIMPAT 50 MG TABLET: 50 mg | ORAL | Qty: 3

## 2016-07-31 MED FILL — LEVETIRACETAM 250 MG TAB: 250 mg | ORAL | Qty: 1

## 2016-07-31 MED FILL — VIMPAT 50 MG TABLET: 50 mg | ORAL | Qty: 4

## 2016-07-31 MED FILL — EFFEXOR XR 150 MG CAPSULE,EXTENDED RELEASE: 150 mg | ORAL | Qty: 1

## 2016-07-31 MED FILL — SERTRALINE 50 MG TAB: 50 mg | ORAL | Qty: 2

## 2016-07-31 MED FILL — FAMOTIDINE (PF) 20 MG/2 ML IV: 20 mg/2 mL | INTRAVENOUS | Qty: 2

## 2016-07-31 NOTE — Progress Notes (Addendum)
Bedside shift change report received from [facility], Charity fundraiser. Report included the following information SBAR, Kardex, Procedure Summary, Intake/Output, MAR and Recent Results.       2030: Pt restless, trying to get up, states back is "killing me". Pain med given, heating pad placed.  2100: Pt rests intermittently, wakes up calling for his mother, moaning.  2230: Pt starting to get angry, wants to be discharged. RN explained why this is not possible at this time.  2300: Britta Mccreedy, aunt, to let her know how pt is behaving, and to reassure pt that this is where he needs to stay.  Aunt states that he has done this on previous admissions in past.  2307: MD paged regarding sedation.  2309: Order received for Precedex.  0400: Pt resting w/ eyes closed, Precedex infusing.    Shift Summary: Pt restless, uncooperative, wanting to leave. Precedex infusing 0.68mcg.  IV dilaudid given for pain. VSS, on RA.

## 2016-07-31 NOTE — Progress Notes (Addendum)
2330: Patient arrived agitated and confused. No command following, moaning and speaking incomprehensible words. Primary Nurse Walden FieldMichele L Kloie Whiting and Larence PenningGary H, RN performed a dual skin assessment on this patient No impairment noted. Just scattered bruising and adrasions s/p fall.  Braden score is 22  2345: Dr. Bonnell PublicMelisi paged, orders for head CT in the morning.  0000: Dr. Lyndel PleasureKalka at bedside with patients mother. Soft wrist restraints applied for patients safety. Per mom this is normal post-ictal behavior for the patient.   0200: Patient remains agitated and confused. Needs constant redirection and PRN meds.  0330: Dr. Lyndel PleasureKalka paged, unable to control patient and not able to give more medicine. Unable to bring patient down for head CT. Orders received for a one time dose of 4mg  IV Ativan.  0600: Patient resting after prn Ativan and Morphine given.     0730: Bedside and Verbal shift change report given to Anderson MaltaJessica M. RN (oncoming nurse) by Harlene SaltsMichele C. RN (offgoing nurse). Report included the following information SBAR, Kardex, ED Summary, Procedure Summary, Intake/Output, MAR, Accordion and Recent Results.

## 2016-07-31 NOTE — Progress Notes (Signed)
Problem: Falls - Risk of  Goal: *Absence of Falls  Document Schmid Fall Risk and appropriate interventions in the flowsheet.  Outcome: Progressing Towards Goal  Fall Risk Interventions:

## 2016-07-31 NOTE — H&P (Signed)
Hospitalist History & Physical      H&P dictated #[id number]      Assessment and Plan:     Principal Problem:    Subdural hematoma (HCC) (07/30/2016) - no surgical intervention at this time per Neurosurg, repeat CT head in AM;    Active Problems:    Epilepsy (HCC) () - with ongoing breakthrough seizures despite 2 meds; Neuro consulted;       Glioma [facility](HCC) (07/31/2016) - last operated in 2008               Signed By: Lynden AngIlona E Cindia Hustead, MD     July 31, 2016

## 2016-07-31 NOTE — Progress Notes (Signed)
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

## 2016-07-31 NOTE — Progress Notes (Addendum)
1530: Bedside and Verbal shift change report given to CINTRON Control and instrumentation engineer(oncoming nurse) by Daphine DeutscherMARTIN (offgoing nurse). Report included the following information SBAR, Kardex, ED Summary, Intake/Output, MAR and Recent Results.   1700: Pt to CT for thoracic/lumbar spine  1730: return from CT

## 2016-07-31 NOTE — Progress Notes (Addendum)
Hospitalist Progress Note  [name], MD  Office: [phone]  Cell: [phone]      Date of Service:  2016/08/25  NAME:  Carl Howell  DOB:  [birth year]  MRN:  [id number]      Admission Summary:   40 yo male with PMhx of Rt Parietal occipital  Oligoastrocytoma s/p resection 2008, s/p chemo, seizure d/o, admitted for fall and witnessed seizures. Pt was transferred to [facility] for CT showing SDH.     Interval history / Subjective:   Pt seen and examined  Agitated overnight  C/o back pain,   Per family usually has 8-9 hours of post-ictal period.   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  - repeat CT this AM stable  - Neurosurgery following, no indication for surgery  - neuro check  - monitor BP    Epilepsy with Breakthrough Seizure   - Keppra 1750mg  BID  - Vimpat BID  - Neuro eval  - seizure precaution  - ATIVAN prn    Intractable Back Pain  - r/o fx  - Xray T and L spine   - Pain management    Hx of Oligoastrocytoma   - s/p resection 2008 and chemo  - MRI brain to evaluate recurrence     Leukocytosis  - improving  - no signs of infection    Mild Hyponatremia  - monitor     Depression   -c/w Zoloft and Effexor     Code status: FULL  DVT prophylaxis: SCDs    Care Plan discussed with: Patient/Family and Nurse  Disposition: Home w/Family     Hospital Problems  Date Reviewed: 08/25/2016          Codes Class Noted POA    Glioma (HCC) ICD-10-CM: C71.9  ICD-9-CM: 191.1  08/25/2016 Yes        Seizures (HCC) ICD-10-CM: R56.9  ICD-9-CM: 780.39  07/30/2016 Yes        * (Principal)Subdural hematoma (HCC) ICD-10-CM: I62.00  ICD-9-CM: 432.1  07/30/2016 Unknown        Epilepsy (HCC) ICD-10-CM: G40.909  ICD-9-CM: 345.90  Unknown Yes                Review of Systems:   A comprehensive review of systems was negative except for that written in the HPI.       Vital Signs:     Last 24hrs VS reviewed since prior progress note. Most recent are:  Visit Vitals   ??? BP 112/73   ??? Pulse 70   ??? Temp 98.4 ??F (36.9 ??C)   ??? Resp 21   ??? SpO2 100%         Intake/Output Summary (Last 24 hours) at August 25, 2016 1119  Last data filed at August 25, 2016 0400   Gross per 24 hour   Intake                0 ml   Output              100 ml   Net             -100 ml        Physical Examination:             Constitutional:  No acute distress, cooperative, pleasant??   HEENT: Bruise on forehead, Oral mucous moist, oropharynx benign. Neck supple,    Resp:  CTA bilaterally.  CV:  Regular rhythm, normal rate,     GI:  Soft, non distended, non tender. normoactive bowel sounds,      Musculoskeletal:  No edema, warm, 2+ pulses throughout    Neurologic:  alert and alert, old Left facial droop and weakness in UE/LE,      Skin:  Good turgor, no rashes or ulcers       Data Review:    Review and/or order of clinical lab test  Review and/or order of tests in the radiology section of CPT  Review and/or order of tests in the medicine section of CPT      Labs:     Recent Labs      07/31/16   0520  07/30/16   1631   WBC  14.5*  18.0*   HGB  12.4  12.7   HCT  36.3*  36.4*   PLT  277  260     Recent Labs      07/31/16   0520  07/30/16   1037   NA  135*  133*   K  3.8  3.5   CL  102  99   CO2  22  25   BUN  9  5*   CREA  0.82  1.00   GLU  103*  114*   CA  9.1  8.6   MG  2.0  2.2   PHOS  2.7   --      Recent Labs      07/30/16   1037   SGOT  24   ALT  25   AP  81   TBILI  0.6   TP  8.0   ALB  4.3   GLOB  3.7     Recent Labs      07/30/16   1611   INR  1.2*   PTP  12.0*   APTT  24.5      No results for input(s): FE, TIBC, PSAT, FERR in the last 72 hours.   No results found for: FOL, RBCF   No results for input(s): PH, PCO2, PO2 in the last 72 hours.  No results for input(s): CPK, CKNDX, TROIQ in the last 72 hours.    No lab exists for component: CPKMB  No results found for: CHOL, CHOLX, CHLST, CHOLV, HDL, LDL, LDLC, DLDLP,  TGLX, TRIGL, TRIGP, CHHD, CHHDX  Lab Results   Component Value Date/Time    Glucose (POC) 146 07/30/2016 04:14 PM    Glucose (POC) 119 07/30/2016 10:13 AM    Glucose (POC) 88 12/06/2012 01:34 PM     Lab Results   Component Value Date/Time    Color YELLOW/STRAW 05/09/2014 02:48 PM    Appearance CLEAR 05/09/2014 02:48 PM    Specific gravity 1.020 05/09/2014 02:48 PM    pH (UA) 8.5 05/09/2014 02:48 PM    Protein 30 05/09/2014 02:48 PM    Glucose NEGATIVE  05/09/2014 02:48 PM    Ketone NEGATIVE  05/09/2014 02:48 PM    Bilirubin NEGATIVE  05/09/2014 02:48 PM    Urobilinogen 0.2 05/09/2014 02:48 PM    Nitrites NEGATIVE  05/09/2014 02:48 PM    Leukocyte Esterase NEGATIVE  05/09/2014 02:48 PM    Epithelial cells FEW 05/09/2014 02:48 PM    Bacteria NEGATIVE  05/09/2014 02:48 PM    WBC 0-4 05/09/2014 02:48 PM    RBC 0-5 05/09/2014 02:48 PM         Medications Reviewed:  Current Facility-Administered Medications   Medication Dose Route Frequency   ??? morphine injection 2 mg  2 mg IntraVENous Q4H PRN   ??? sodium chloride (NS) flush 5-10 mL  5-10 mL IntraVENous Q8H   ??? sodium chloride (NS) flush 5-10 mL  5-10 mL IntraVENous PRN   ??? acetaminophen (TYLENOL) tablet 650 mg  650 mg Oral Q4H PRN   ??? ondansetron (ZOFRAN) injection 4 mg  4 mg IntraVENous Q4H PRN   ??? lacosamide (VIMPAT) tablet 150 mg  150 mg Oral BID   ??? levETIRAcetam (KEPPRA) tablet 1,750 mg  1,750 mg Oral BID   ??? LORazepam (ATIVAN) tablet 1 mg  1 mg Oral Q4H PRN   ??? famotidine (PEPCID) tablet 20 mg  20 mg Oral BID   ??? sertraline (ZOLOFT) tablet 100 mg  100 mg Oral DAILY   ??? venlafaxine-SR (EFFEXOR-XR) capsule 150 mg  150 mg Oral DAILY WITH BREAKFAST   ??? LORazepam (ATIVAN) injection 1 mg  1 mg IntraVENous Q2H PRN   ??? LORazepam (ATIVAN) injection 2 mg  2 mg IntraVENous Q4H PRN     ______________________________________________________________________  EXPECTED LENGTH OF STAY: - - -  ACTUAL LENGTH OF STAY:          1                 [name], MD

## 2016-07-31 NOTE — Consults (Signed)
PULMONARY/CRITICAL CARE/SLEEP MEDICINE    Initial Physician Consultation Note    Name: Carl Howell   DOB: Aug 28, 1976   MRN: [id number]   Date: 07/31/2016      Subjective:   Consult Note: 07/31/2016     This patient has been seen and evaluated as a new admission to ICU. Medical records and data reviewed.  Patient is a 40 y.o. male who presented to the hospital with complaints of fall. Patient has a history of cerebral tumor resected in the past, last in 2008 and apparently had a seizure after which he fell. CT scan of the head showed presence of subdural hematoma and he was transferred to [facility]. Management and observation. Neurosurgery is following and no intervention is planned. He has had no further seizures during Intensive Care Unit stay and is hemodynamically stable. He does complain of severe back pain which is being investigated by the hospitalist. Neurology has been consulted and is following as well      Assessment:     Posttraumatic subdural hematoma  Seizure disorder with breakthrough seizures  History of cerebral tumor  Back pain, uncontrolled  Other medical problems per chart    Recommendations:     Management per neurosurgery and neurology  Antiepileptics are being adjusted  Pain management and workup of back pain per hospitalist  SCDs  He appears to be stable for transfer to neuro telemetry  We will be available to see him again we can help    Active Problem List:     Problem List  Date Reviewed: 07/31/2016          Codes Class    Glioma (HCC) ICD-10-CM: C71.9  ICD-9-CM: 191.1         Seizures (HCC) ICD-10-CM: R56.9  ICD-9-CM: 780.39         * (Principal)Subdural hematoma (HCC) ICD-10-CM: I62.00  ICD-9-CM: 432.1         Epilepsy (HCC) ICD-10-CM: G40.909  ICD-9-CM: 345.90               Past Medical History:      has a past medical history of Cancer of brain (HCC) and Epilepsy (HCC) (unknown).    Past Surgical History:       has a past surgical history that includes kidney dialysate delivry sys; brain avm surg,dural,complx; and neurological procedure unlisted.    Home Medications:     Prior to Admission medications    Medication Sig Start Date End Date Taking? Authorizing Provider   sertraline (ZOLOFT) 100 mg tablet Take 100 mg by mouth daily.    Phys Other, MD   raNITIdine (ZANTAC) 150 mg tablet Take 150 mg by mouth two (2) times a day.    Phys Other, MD   LORazepam (ATIVAN) 1 mg tablet Take 1 mg by mouth every four (4) hours as needed for Anxiety.    Phys Other, MD   DANDELION PO Take 520 mg by mouth daily.    Phys Other, MD   ASHWAGANDHA Katheren PullerOOT EXTRACT,BULK, 1 Tab by Does Not Apply route.    Phys Other, MD   VENLAFAXINE HCL (EFFEXOR XR PO) Take 150 mg by mouth.    Phys Other, MD   ondansetron (ZOFRAN ODT) 4 mg disintegrating tablet Take 1 Tab by mouth every eight (8) hours as needed for Nausea. 04/16/15   Evette DoffingJohn Hughes, DO   levetiracetam (KEPPRA) 500 mg tablet TAKE 4 TABLETS BY MOUTH IN THE MORNING AND 3 TABLETS IN THE EVENING. SAME GENERIC  FOR [facility] REFILL  Patient taking differently: 1,750 mg two (2) times a day. TAKE 4 TABLETS BY MOUTH IN THE MORNING AND 3 TABLETS IN THE EVENING. SAME GENERIC FOR EACH REFILL 08/05/13   Louanna Raw, NP   lacosamide (VIMPAT) 100 mg tab tablet Take 1 tablet by mouth two (2) times a day.  Patient taking differently: Take 150 mg by mouth two (2) times a day. 08/05/13   Louanna Raw, NP       Allergies/Social/Family History:     No Known Allergies   Social History   Substance Use Topics   ??? Smoking status: Former Smoker     Packs/day: 0.25     Types: Cigarettes   ??? Smokeless tobacco: Never Used   ??? Alcohol use No      History reviewed. No pertinent family history.     Review of Systems:     Review of systems not obtained due to patient factors.    Objective:   Vital Signs:  Visit Vitals   ??? BP 109/75   ??? Pulse 72   ??? Temp 97.8 ??F (36.6 ??C)   ??? Resp 17   ??? SpO2 98%       O2 Device: Room air Temp (24hrs), Avg:98.2 ??F (36.8 ??C), Min:97.8 ??F (36.6 ??C), Max:98.4 ??F (36.9 ??C)           Intake/Output:     Intake/Output Summary (Last 24 hours) at 07/31/16 1408  Last data filed at 07/31/16 1100   Gross per 24 hour   Intake                0 ml   Output              750 ml   Net             -750 ml       Physical Exam:   General:  Alert, cooperative, uncomfortable, appears stated age.   Head:  Normocephalic, without obvious abnormality, atraumatic.   Eyes:  Conjunctivae/corneas clear. PERRL, EOMs intact.   Neck: Supple, symmetrical, trachea midline, no adenopathy, thyroid: no enlargment/tenderness/nodules, no carotid bruit and no JVD.   Lungs:   Clear to auscultation bilaterally.   Chest wall:  No tenderness or deformity.   Heart:  Regular rate and rhythm, S1, S2 normal, no murmur, click, rub or gallop.   Abdomen:   Soft, non-tender. Bowel sounds normal. No masses,  No organomegaly.   Extremities: Extremities normal, atraumatic, no cyanosis or edema.   Pulses: 2+ and symmetric all extremities.   Skin: Skin color, texture, turgor normal. No rashes or lesions   Neurologic: Grossly nonfocal         LABS AND  DATA: Personally reviewed  Recent Labs      07/31/16   0520  07/30/16   1631   WBC  14.5*  18.0*   HGB  12.4  12.7   HCT  36.3*  36.4*   PLT  277  260     Recent Labs      07/31/16   0520  07/30/16   1037   NA  135*  133*   K  3.8  3.5   CL  102  99   CO2  22  25   BUN  9  5*   CREA  0.82  1.00   GLU  103*  114*   CA  9.1  8.6   MG  2.0  2.2   PHOS  2.7   --      Recent Labs      07/30/16   1037   SGOT  24   AP  81   TP  8.0   ALB  4.3   GLOB  3.7     Recent Labs      07/30/16   1611   INR  1.2*   PTP  12.0*   APTT  24.5      No results for input(s): PHI, PCO2I, PO2I, FIO2I in the last 72 hours.  No results for input(s): CPK, CKMB, TROIQ, BNPP in the last 72 hours.    MEDS: Reviewed    Chest X-Ray: personally reviewed and report checked    EKG/ Tele       Thank you for allowing me to participate in this patient's care.    Darra Lis, MD    Darra Lis, MD, [facility]  [facility]

## 2016-07-31 NOTE — Progress Notes (Addendum)
0820: Dr. Alison Murrayansom consulted with neurology. Patient has left arm weakness.   0845: Patient down for head CT.  1230: Patient more alert. Restlessness. Extreme back pain. RN talked to MD.  1400: Xray of spine complete!!!      Shift Summary: AOx3. VSS. Repeat CT head complete. Neuro consulted.More aware of surroundings this afternoon. Extreme back pain that was not controlled with Morphine. Xray spine complete. Pain better controlled with Dilaudid. Plan for MRI tomorrow.

## 2016-07-31 NOTE — Consults (Signed)
Neuro consult completed.  Dictated note to follow.

## 2016-07-31 NOTE — H&P (Signed)
[facility] [place] [facility]   [address]   [place], [place] [id number]   HISTORY AND PHYSICAL       Name:  Carl Howell, Carl Howell   MR#:  [id number]   DOB:  1976-03-10   Account #:  [id number]        Date of Adm:  07/30/2016       CHIEF COMPLAINT: Subdural hematoma/altered mental status.    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 Per mother at bedside, the patient [facility]; however, his insurance changed and [facility] no longer accepts   [place], and the [facility]. Per   mother, around 9:00 this morning, the patient fell down the stairs about   8 feet in height and she found him seizing for about 5 minutes,   including tonic-clonic movements and tongue biting. Per mother, the   seizures are not unusual. He takes Vimpat and Keppra and takes all   his medications as prescribed. He usually has a prolonged postictal   state of several hours, 8 to 9 hours, but today it seems to be more   prolonged than usual. Immediately after the fall, the patient was   transferred to the nearest ED where a CT scan of the head revealed a right subdural   hematoma measuring up to 5 mm in thickness with a slight mass effect   and leftward midline shift of 3 mm. The patient was evaluated by   Neurosurgery and [facility]. No history   is obtainable from the patient due to confusion and postictal state.   History was obtained from chart review and the patient's mother, with   whom he lives.     The patient is compliant with medications and there is no report of   chest pain, shortness of breath, palpitations, fevers, recent illnesses,   recent hospitalization, urinary symptoms, or any other complaints.     PAST MEDICAL HISTORY   1.  Grade III right parietal occipital glioma.   2.  Epilepsy.    ALLERGIES: NKDA.    MEDICATIONS ON ADMISSION   1.  Zoloft 100 mg p.o. daily.   2.  Zantac 150 mg p.o. b.i.d.   3.  Lorazepam 1 mg p.o. every 4 hours as needed for anxiety.   4.  Effexor 150 mg p.o. daily.   5.  Zofran 4 mg p.o. every 8 hours as needed for nausea.   6.  Keppra 1750 mg p.o. b.i.d.   7.  Vimpat 150 mg p.o. b.i.d.     SOCIAL HISTORY: Previously documented in the chart. No history is   obtainable from the patient at the time of admission. The patient is   married, lives at home with his mother. He is a former smoker of about   a quarter of a pack of cigarettes per day. He does not drink alcohol.   Occasionally smokes marijuana.     FAMILY HISTORY:  Unobtainable from the patient.    No history documented in the chart.    REVIEW OF SYSTEMS: Unobtainable due to confusion and postictal   state.  PHYSICAL EXAMINATION   VITAL SIGNS: On admission, blood pressure 120/75, heart rate 78,   respiratory rate 26, O2 saturation 100% on room air.   GENERAL: The patient is awake but very agitated, very restless. He   complains of back pain, but overall communication is difficult as the   patient does not articulate, which per mother is typical in the postictal   period of time. He is in no acute respiratory distress. He has a well-  healed craniotomy scar. He has redness and a slight bump on the right   forehead. He also has a scraped right lateral knee. He does not   cooperate with exam. He is trying to climb out of bed.   NECK: Supple, nontender. No JVD or lymphadenopathy.   LUNGS: Clear to auscultation bilaterally, anteriorly and posteriorly. No   rales, rhonchi, or wheezing.   CARDIOVASCULAR: Reveals a regular rhythm and rate, S1, S2, no   tachycardia.   ABDOMEN: Soft, bowel sounds present throughout.   EXTREMITIES: Reveal no peripheral edema.   SKIN: Has normal turgor. No rashes or lesions other than described   initially.    NEUROLOGIC: As mentioned, the patient is restless, agitated, trying   to climb out of bed, does not follow commands. There are no obvious   abnormalities of the cranial nerves 2-12.     DIAGNOSTIC DATA: CBC: White blood cell count 18.0 on arrival. His   initial white blood cell count this morning was 10.7. Hemoglobin is   12.7, hematocrit is 36.4, platelets 260. BMP: Sodium 133, potassium   3.5, chloride 99, CO2 25, blood glucose 114, BUN 5, creatinine 1.0.   Magnesium 2.2, calcium 8.6. LFTs within normal range. Urine tox   screen was positive for opiates and THC. The patient received   morphine in the emergency room. Alcohol level was less than 10. INR   1.2. CT head shows stable acute right subdural hematoma measuring   up to 5 mm in thickness. Mild underlying mass effect with effacement   of the right lateral ventricle and new leftward midline shift of 3 mm.   Prior right posterior craniotomy with underlying encephalomalacia, right   posterior subchondral soft tissue hematoma. EKG shows sinus   tachycardia with a ventricular rate of 107 beats per minute.    ASSESSMENT AND PLAN   1.  Traumatic subdural hematoma: The patient was evaluated by   Neurosurgery. The patient does not require any immediate surgical   interventions. The patient was admitted to ICU for close neurological   monitoring. I will repeat CT scan in a.m. The patient may also need an   MRI in the a.m.   2.  Grade III glioma: Last operated in 2008 at [facility].   3.  Seizure disorder: The patient is on Vimpat and Keppra, which I will   continue. It appears that the patient continues to get breakthrough   seizures despite medications. Consult Neurology.   4.  Leukocytosis: It is not clear whether the patient received any   steroids in the emergency room at [facility]. He had an episode of   vomiting at around 3:30 p.m. yesterday. No chest x-ray is available. I   will order a chest x-ray and hold off on antibiotics for now as the patient    is afebrile and not desaturating. He is on room air, has no respiratory   distress. However, if he develops a fever I would start him empirically   on IV antibiotics  to cover for possible aspiration.   5.  Deep venous thrombosis prophylaxis: With bilateral sequential   compression devices.   6.  CODE STATUS: FULL CODE.     The case was discussed with the patient's mother at bedside. On   behalf of the hospitalist team, thank you for involving [place] in the care of   this patient.         Jeneen Rinks, MD      IK / SB   D:  07/31/2016   01:07   T:  07/31/2016   10:12   Job #:  [id number]

## 2016-07-31 NOTE — Consults (Signed)
Consults by Darra Lis, MD at 08/25/2016 1408                Author: Darra Lis, MD  Service: Pulmonary Disease  Author Type: Physician       Filed: 2016-08-25 1422  Date of Service: 08/25/16 1408  Status: Signed          Editor: Darra Lis, MD (Physician)                       PULMONARY/CRITICAL CARE/SLEEP MEDICINE      Initial Physician Consultation Note         Name:  Carl Howell        DOB:  07/25/1976     MRN:  [id number]     Date:  25-Aug-2016           Subjective:     Consult Note: 08-25-2016       This patient has been seen and evaluated as a new admission to ICU. Medical records and data reviewed.   Patient is a 40 y.o. male who presented to the hospital with complaints of fall. Patient has a history of cerebral tumor resected in the past, last in 2008 and apparently had a seizure after which he fell. CT scan of the head showed presence of subdural  hematoma and he was transferred to [facility]. Management and observation. Neurosurgery is following and no intervention is planned. He has had no further seizures during Intensive Care Unit stay and is hemodynamically stable. He does complain  of severe back pain which is being investigated by the hospitalist. Neurology has been consulted and is following as well           Assessment:        Posttraumatic subdural hematoma   Seizure disorder with breakthrough seizures   History of cerebral tumor   Back pain, uncontrolled   Other medical problems per chart        Recommendations:        Management per neurosurgery and neurology   Antiepileptics are being adjusted   Pain management and workup of back pain per hospitalist   SCDs   He appears to be stable for transfer to neuro telemetry   We will be available to see him again we can help        Active Problem List:           Problem List   Date Reviewed:  2016-08-25                Codes  Class            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        Past Surgical History:         has a past surgical history that includes kidney dialysate delivry sys; brain avm surg,dural,complx; and neurological procedure unlisted.        Home Medications:          Prior to Admission medications             Medication  Sig  Start Date  End Date  Taking?  Authorizing Provider            sertraline (ZOLOFT) 100 mg tablet  Take 100 mg by mouth daily.        Phys Other, MD     raNITIdine (ZANTAC) 150 mg tablet  Take 150 mg by mouth two (2) times a day.        Phys Other, MD     LORazepam (ATIVAN) 1 mg tablet  Take 1 mg by mouth every four (4) hours as needed for Anxiety.        Phys Other, MD     DANDELION PO  Take 520 mg by mouth daily.        Phys Other, MD     ASHWAGANDHA Katheren PullerOOT EXTRACT,BULK,  1 Tab by Does Not Apply route.        Phys Other, MD     VENLAFAXINE HCL (EFFEXOR XR PO)  Take 150 mg by mouth.        Phys Other, MD     ondansetron (ZOFRAN ODT) 4 mg disintegrating tablet  Take 1 Tab by mouth every eight (8) hours as needed for Nausea.  04/16/15      Evette DoffingJohn Hughes, DO     levetiracetam (KEPPRA) 500 mg tablet  TAKE 4 TABLETS BY MOUTH IN THE MORNING AND 3 TABLETS IN THE EVENING. SAME GENERIC FOR [facility] REFILL   Patient taking differently: 1,750 mg two (2) times a day. TAKE 4 TABLETS BY MOUTH IN THE MORNING AND 3 TABLETS IN THE EVENING. SAME GENERIC FOR EACH REFILL  08/05/13      Louanna RawKara A Clark, NP            lacosamide (VIMPAT) 100 mg tab tablet  Take 1 tablet by mouth two (2) times a day.   Patient taking differently: Take 150 mg by mouth two (2) times a day.  08/05/13      Louanna RawKara A Clark, NP             Allergies/Social/Family  History:        No Known Allergies      Social History       Substance Use Topics         ?  Smoking status:  Former Smoker              Packs/day:  0.25         Types:  Cigarettes         ?  Smokeless tobacco:  Never Used         ?  Alcohol use  No         History reviewed. No pertinent family history.         Review of Systems:        Review of systems not obtained due to patient factors.        Objective:     Vital Signs:     Visit Vitals         ?  BP  109/75     ?  Pulse  72     ?  Temp  97.8 ??F (36.6 ??C)     ?  Resp  17         ?  SpO2  98%        O2 Device: Room air Temp (24hrs), Avg:98.2 ??F  (36.8 ??C), Min:97.8 ??F (36.6 ??C), Max:98.4 ??F (36.9 ??C)               Intake/Output:       Intake/Output Summary (Last 24 hours) at 07/31/16 1408  Last data filed at 07/31/16 1100        Gross per 24 hour     Intake                 0 ml     Output               750 ml     Net              -750 ml           Physical Exam:       General:   Alert, cooperative, uncomfortable, appears stated age.        Head:   Normocephalic, without obvious abnormality, atraumatic.     Eyes:   Conjunctivae/corneas clear. PERRL, EOMs intact.     Neck:  Supple, symmetrical, trachea midline, no adenopathy, thyroid: no enlargment/tenderness/nodules, no carotid bruit and no JVD.     Lungs:    Clear to auscultation bilaterally.        Chest wall:   No tenderness or deformity.        Heart:   Regular rate and rhythm, S1, S2 normal, no murmur, click, rub or gallop.        Abdomen:    Soft, non-tender. Bowel sounds normal. No masses,  No organomegaly.        Extremities:  Extremities normal, atraumatic, no cyanosis or edema.     Pulses:  2+ and symmetric all extremities.     Skin:  Skin color, texture, turgor normal. No rashes or lesions     Neurologic:  Grossly nonfocal             LABS AND  DATA: Personally reviewed     Recent Labs             07/31/16    0520   07/30/16    1631     WBC   14.5*   18.0*     HGB   12.4   12.7     HCT   36.3*   36.4*          PLT   277   260          Recent Labs             07/31/16    0520   07/30/16    1037     NA   135*   133*     K   3.8   3.5     CL   102   99     CO2   22   25     BUN   9   5*     CREA   0.82   1.00     GLU   103*   114*     CA   9.1   8.6  MG   2.0   2.2         PHOS   2.7    --           Recent Labs            07/30/16    1037     SGOT   24     AP   81     TP   8.0     ALB   4.3        GLOB   3.7          Recent Labs            07/30/16    1611     INR   1.2*     PTP   12.0*        APTT   24.5         No results for input(s): PHI, PCO2I, PO2I, FIO2I in the last 72 hours.   No results for input(s): CPK, CKMB, TROIQ, BNPP in the last 72 hours.      MEDS: Reviewed      Chest X-Ray: personally reviewed and report checked      EKG/ Tele         Thank you for allowing me to participate in this patient's care.      Darra Lis, MD      Darra Lis, MD, [facility]   [facility]

## 2016-07-31 NOTE — Consults (Signed)
Neuro consult completed.  Dictated note to follow.

## 2016-07-31 NOTE — Consults (Signed)
[facility]   [address]   [place], [place] [address]   CONSULTATION       Name:  Carl Howell, Carl Howell   MR#:  [id number]   DOB:  1976-08-04   Account #:  [id number]    Date of Consultation:  07/31/2016   Date of Adm:  07/30/2016       NEUROLOGY CONSULTATION     DATE OF CONSULTATION: 07/31/2016.    HISTORY OF PRESENT ILLNESS: This is a 40 year old gentleman   who presented to the emergency department after he fell down a flight   of stairs having a seizure. He is seen with his mother at the bedside   who aids in the history. Additional history is obtained from the chart.   Apparently, the patient's son heard him or saw him fall down a flight of   stairs, landing on his head, and called for the patient's mother who   arrived within seconds and found him having his typical seizure that   lasted for 5 minutes with tongue trauma. 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 His last seizure was in July. He is followed by Dr.   Hortencia Conradi at [facility]. No medications changes were made at that time.   He is on Vimpat 150 mg b.i.d. and Keppra 500 mg tablets, 3-1/2 tablets   b.i.d.    The patient's mom notes that with his typical seizures, he has left-sided   shaking with loss of awareness and loss of consciousness and prolonged post-ictal confusion. His CT of   the head in the ED showed right subdural hematoma. The C-spine CT   was unremarkable. His urine drug screen was positive for THC and   opiates. Vimpat level was ordered and pending. Sodium is 133. CBC   was unremarkable. The patient has been confused and agitated since   being in the ICU, but has not had any subsequent seizures.    PAST MEDICAL/SURGICAL HISTORY    1. 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   2. 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     REVIEW OF SYSTEMS: Cannot be obtained  secondary to the   patient's mental status, but he is complaining of back pain.    MEDICATIONS AT HOME:   1. Zoloft.   2. Zantac.   3. Ativan 1 mg q.4h.   4. Effexor.   5. Zofran.   6. Keppra 1750 mg b.i.d.   7. Vimpat 150 mg b.i.d.    ALLERGIES: NONE.    SOCIAL HISTORY: He is a widower. His wife passed away last year. I   believe he has 2 children. He smokes and uses marijuana. No alcohol   Use. On disability after tumor and resection with Mom noting very poor short term memory.    FAMILY HISTORY: Unable to be obtained from the patient.    PHYSICAL EXAMINATION   VITAL SIGNS: Blood pressure 114/70, pulse 70, respiratory rate 23,   saturation 100% on room air, temperature is 97.8, and he is afebrile.   GENERAL: He is a well-nourished, well-developed, healthy-appearing   gentleman, lying in bed in some distress, trying to sit up and get out of   bed. He is complaining of being hot and then the next minute being cold.   HEENT: He has a craniotomy  scar on the right. He has a large   contusion to his right forehead.   HEART: Regular rate and rhythm without murmurs, gallops or rubs.   His carotids are 2+, no bruits.   EXTREMITIES: Warm. He has 2+ radial pulses. No edema.     NEUROLOGICAL EXAMINATION   MENTAL STATUS: He is alert. He is oriented to being in a hospital--  thinks he is at [facility], tells me it is September 2017. His speech is not   dysarthric. His language is intact. He is able to follow commands at   times; however, he is not completely appropriate, again, trying to get   out of bed, stating he wants to take a warm bath, and wants to walk   around.   CRANIAL NERVE EXAM: No asymmetry or ptosis. His extraocular eye   movements are intact. His visual fields are full. His pupils are equally   round and reactive. Tongue is midline. His palate elevates   symmetrically.   MOTOR EXAM: He has 5/5 strength throughout the right upper and   lower extremity. He has weakness in the left upper and lower   extremity, although it  was difficult to get him to activate all muscles.   SENSORY: Sensory exam appear to be intact to light touch   throughout.    REFLEXES: Reflexes are 2+ and symmetric. His toes are downgoing.   COORDINATION: Coordination could not be assessed at this time.   GAIT: Gait could not be assessed at this time.    STUDIES AND REPORTS: Reviewed above in the HPI.    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 I will increase his Vimpat to 200 mg b.i.d. and continue his Keppra at   home dose of 1750 mg b.i.d. Will follow up again tomorrow.        Scarlett Presto, MD      MR / [facility]   D:  07/31/2016   22:53   T:  08/01/2016   03:10   Job #:  [id number]

## 2016-08-01 LAB — EKG, 12 LEAD, INITIAL
Atrial Rate: 107 {beats}/min
Calculated P Axis: 87 degrees
Calculated R Axis: 63 degrees
Calculated T Axis: 62 degrees
P-R Interval: 170 ms
Q-T Interval: 340 ms
QRS Duration: 90 ms
QTC Calculation (Bezet): 453 ms
Ventricular Rate: 107 {beats}/min

## 2016-08-01 LAB — METABOLIC PANEL, BASIC
Anion gap: 9 mmol/L (ref 5–15)
BUN/Creatinine ratio: 17 (ref 12–20)
BUN: 11 MG/DL (ref 6–20)
CO2: 25 mmol/L (ref 21–32)
Calcium: 9.1 MG/DL (ref 8.5–10.1)
Chloride: 101 mmol/L (ref 97–108)
Creatinine: 0.63 MG/DL — ABNORMAL LOW (ref 0.70–1.30)
GFR est AA: >60 mL/min/{1.73_m2} (ref >60)
GFR est non-AA: >60 mL/min/{1.73_m2} (ref >60)
Glucose: 96 mg/dL (ref 65–100)
Potassium: 3.6 mmol/L (ref 3.5–5.1)
Sodium: 135 mmol/L — ABNORMAL LOW (ref 136–145)

## 2016-08-01 LAB — EKG 12-LEAD
Atrial Rate: 107 {beats}/min
P Axis: 87 degrees
P-R Interval: 170 ms
Q-T Interval: 340 ms
QRS Duration: 90 ms
QTc Calculation (Bazett): 453 ms
R Axis: 63 degrees
T Axis: 62 degrees
Ventricular Rate: 107 {beats}/min

## 2016-08-01 MED ORDER — NICOTINE 21 MG/24 HR DAILY PATCH
21 mg/24 hr | Freq: Every day | TRANSDERMAL | Status: DC
Start: 2016-08-01 — End: 2016-08-08

## 2016-08-01 MED ORDER — HYDROMORPHONE (PF) 1 MG/ML IJ SOLN
1 mg/mL | INTRAMUSCULAR | Status: DC | PRN
Start: 2016-08-01 — End: 2016-08-02
  Administered 2016-08-01 – 2016-08-02 (×3): via INTRAVENOUS

## 2016-08-01 MED ORDER — DEXMEDETOMIDINE 100 MCG/ML IV SOLN
100 mcg/mL | INTRAVENOUS | Status: DC
Start: 2016-08-01 — End: 2016-08-04
  Administered 2016-08-01 – 2016-08-04 (×48): via INTRAVENOUS

## 2016-08-01 MED ORDER — OXYCODONE-ACETAMINOPHEN 5 MG-325 MG TAB
5-325 mg | ORAL | Status: DC | PRN
Start: 2016-08-01 — End: 2016-08-01
  Administered 2016-08-01: 17:00:00 via ORAL

## 2016-08-01 MED ORDER — DEXMEDETOMIDINE 100 MCG/ML IV SOLN
100 mcg/mL | INTRAVENOUS | Status: DC
Start: 2016-08-01 — End: 2016-07-31

## 2016-08-01 MED ORDER — OXYCODONE-ACETAMINOPHEN 10 MG-325 MG TAB
10-325 mg | ORAL | Status: DC | PRN
Start: 2016-08-01 — End: 2016-08-08
  Administered 2016-08-01 – 2016-08-08 (×23): via ORAL

## 2016-08-01 MED FILL — OXYCODONE-ACETAMINOPHEN 5 MG-325 MG TAB: 5-325 mg | ORAL | Qty: 1

## 2016-08-01 MED FILL — VIMPAT 50 MG TABLET: 50 mg | ORAL | Qty: 4

## 2016-08-01 MED FILL — PRECEDEX 100 MCG/ML INTRAVENOUS SOLUTION: 100 mcg/mL | INTRAVENOUS | Qty: 4

## 2016-08-01 MED FILL — HYDROMORPHONE (PF) 1 MG/ML IJ SOLN: 1 mg/mL | INTRAMUSCULAR | Qty: 1

## 2016-08-01 MED FILL — HYDROMORPHONE (PF) 1 MG/ML IJ SOLN: 1 mg/mL | INTRAMUSCULAR | Qty: 2

## 2016-08-01 MED FILL — LEVETIRACETAM 250 MG TAB: 250 mg | ORAL | Qty: 1

## 2016-08-01 MED FILL — LORAZEPAM 2 MG/ML IJ SOLN: 2 mg/mL | INTRAMUSCULAR | Qty: 1

## 2016-08-01 MED FILL — NORMAL SALINE FLUSH 0.9 % INJECTION SYRINGE: INTRAMUSCULAR | Qty: 30

## 2016-08-01 MED FILL — ONDANSETRON (PF) 4 MG/2 ML INJECTION: 4 mg/2 mL | INTRAMUSCULAR | Qty: 2

## 2016-08-01 MED FILL — CYCLOBENZAPRINE 10 MG TAB: 10 mg | ORAL | Qty: 1

## 2016-08-01 MED FILL — FAMOTIDINE 20 MG TAB: 20 mg | ORAL | Qty: 1

## 2016-08-01 MED FILL — NORMAL SALINE FLUSH 0.9 % INJECTION SYRINGE: INTRAMUSCULAR | Qty: 40

## 2016-08-01 MED FILL — OXYCODONE-ACETAMINOPHEN 10 MG-325 MG TAB: 10-325 mg | ORAL | Qty: 1

## 2016-08-01 MED FILL — SERTRALINE 50 MG TAB: 50 mg | ORAL | Qty: 2

## 2016-08-01 MED FILL — NICOTINE 21 MG/24 HR DAILY PATCH: 21 mg/24 hr | TRANSDERMAL | Qty: 1

## 2016-08-01 MED FILL — VENLAFAXINE SR 150 MG 24 HR CAP: 150 mg | ORAL | Qty: 1

## 2016-08-01 NOTE — Progress Notes (Signed)
CM reviewed chart.  CM noted pt had subdural hematoma with history of cancer of the brain, craniotomy, and epilepsy.  CM spoke with pt mother over phone to obtain history.        CURRENT LIVING SITUATION-  Pt lives with his mother and two children in a private residence. Pt's wife has been deceased for about one year.  Pt does not drive due to epilepsy and gets assistance from his mother as needed.  Pt has used a cane in the past.  Pt is currently not using any assistive devices and was independent with ADL's and IADL's.  Pt's PCP is Dr Tommi Rumpsasha Dickerson phone # [phone].  Mother states it has been "a while" since he has seen her, but is unable to recall how long.  Pt gets his prescriptions filled at [facility].  CM verified with pt's mother his insurance is [employer] ID # [id number]. CM called patient access to update chart with information and instructed mother to bring his card in so that it can be added to his chart.  Pt does not have an AMD.       DISCHARGE PLANNING-  Disposition is undetermined at this time.  Loleta RoseAmy Culley, RN CRM      Care Management Interventions  PCP Verified by CM: Yes (Dr Tommi Rumpsasha Dickerson phone # [phone])  Palliative Care Consult (Criteria: CHF and RRAT>21): No  Mode of Transport at Discharge: Other (see comment) (undetermined at this time)  Transition of Care Consult (CM Consult): Discharge Planning  Physical Therapy Consult: No  Occupational Therapy Consult: No  Speech Therapy Consult: No  Current Support Network: Relative's Home (Lives with mother)  Confirm Follow Up Transport: Family  Plan discussed with Pt/Family/Caregiver: Yes  Freedom of Choice Offered: Yes

## 2016-08-01 NOTE — Progress Notes (Signed)
Neurosurgery Progress Note  Isidor Holts, ACNP-BC  [phone]        Admit Date: 07/30/2016   LOS: 2 days        Daily Progress Note: 08/01/2016      Subjective:   The patient fell down the stairs while having a seizure. He has a history of epilepsy after a resection of a oligoastrocytoma in 2008 at [facility] with a second resection 3 years ago. He is on multiple AEDs. His head CT in the ER revealed a subdural hematoma. The patient does not remember falling down the stairs. He began complaining of terrible back pain over the weekend. An x-ray revealed an age-indeterminate compression fracture. He was unable to lie flat for an MRI, so a thoracic CT scan was obtained, which revealed an unstable T5 burst fracture. The patient has been sedated on Precedex as patient was climbing out of the bed and the patient trying to get up and leave. When asked if he has numbness and tingling or pain going down his extremities he denies it. Later he says he has some left arm numbness and weakness, which is typical with his Todd's paralysis from his seizures.    Denies  chest pain, leg pain, nausea, vomiting, difficulty swallowing, headache, and dyspnea.       Objective:     Vital signs  Temp (24hrs), Avg:97.7 ??F (36.5 ??C), Min:97.1 ??F (36.2 ??C), Max:98.5 ??F (36.9 ??C)   09/18 0701 - 09/18 1900  In: 36.7 [I.V.:36.7]  Out: -   09/16 1901 - 09/18 0700  In: 33.2 [I.V.:33.2]  Out: 1250 [Urine:1250]    Visit Vitals   ??? BP (!) 87/61   ??? Pulse 71   ??? Temp 98.5 ??F (36.9 ??C)   ??? Resp 17   ??? Wt 65.1 kg (143 lb 8.3 oz)   ??? SpO2 98%   ??? BMI 20.02 kg/m2      O2 Device: Room air     Pain control  Pain Assessment  Pain Scale 1: Numeric (0 - 10)  Pain Intensity 1: 6  Pain Onset 1: acute  Pain Location 1: Back  Pain Orientation 1: Posterior  Pain Description 1: Throbbing  Pain Intervention(s) 1: Medication (see MAR)    PT/OT  Gait                 Physical Exam:  Gen:NAD. Precedex 0.5 mcg/kg/hr infusing. Not turned off for exam.   Neuro: Lethargic from sedation. Ox3. Follows commands. Speech clear. Affect normal.  PERRL. EOMI.   Strength RUE/RLE 5/5, LUE flaccid, LLE 5-/5  Gait deferred.    CT thoracic spine without contrast on 08/01/16 shows heavily comminuted T5 burst fracture involving all 3 columns is unstable. Surgical consultation recommended. Retropulsion causes mild canal and mild right neural foraminal stenosis. Subtle, nondisplaced T6 fracture. Trace paraspinous hemorrhage adjacent T7 without visible fracture. No displaced lumbar fracture; lumbar evaluation limited by motion.    CT head without contrast on 07/30/16 shows acute right subdural hematoma measuring up to 5 mm in thickness. No significant midline shift or mass effect. No acute hydrocephalus.    CT head without contrast on 07/31/16 shows stable small right parietal subdural hemorrhage.    24 hour results:    Recent Results (from the past 24 hour(s))   METABOLIC PANEL, BASIC    Collection Time: 08/01/16  5:10 AM   Result Value Ref Range    Sodium 135 (L) 136 - 145 mmol/L    Potassium 3.6  3.5 - 5.1 mmol/L    Chloride 101 97 - 108 mmol/L    CO2 25 21 - 32 mmol/L    Anion gap 9 5 - 15 mmol/L    Glucose 96 65 - 100 mg/dL    BUN 11 6 - 20 MG/DL    Creatinine 1.610.63 (L) 0.70 - 1.30 MG/DL    BUN/Creatinine ratio 17 12 - 20      GFR est AA >60 >60 ml/min/1.8673m2    GFR est non-AA >60 >60 ml/min/1.573m2    Calcium 9.1 8.5 - 10.1 MG/DL          Assessment:     Principal Problem:    Subdural hematoma (HCC) (07/30/2016)    Active Problems:    Epilepsy (HCC) ()      Seizures (HCC) (07/30/2016)      Glioma (HCC) (07/31/2016)        Plan:   1. Subdural hematoma, traumatic right parietal   - CT scan stable   - Will need repeat head CT in 1-2 weeks to eval  2. Unstable T5 burst fracture   - Will need surgical stabilization. Working with Dr. Bonnell PublicMelisi to figure out the timing of this plan   - Bedrest for now   - Precedex to help with agitation   - Pain control - Dilaudid IV, Perocet PRN, Ativan PRN   3. Epilepsy   - Neurology following   - Vimpat dose increased. Keppra dose from home continued  4. Hx grade III oligoastrocytoma   - CT scan stable  5. Tobacco abuse   - Nicotine patch  6. Depression   - Cont effexor and zoloft from home   - Hospitalist following    Activity: bedrest  DVT ppx: SCDs  Dispo: tbd    Plan d/w Dr. Bonnell PublicMelisi and Dr. Denita LungSanghani. Awaiting timing of OR. Possibly tomorrow.      Brent GeneralJaime M Markham, NP

## 2016-08-01 NOTE — Consults (Signed)
[place] [facility]   [address]   [place], [place] [address]   CONSULTATION       Name:  ZYAN, COBY   MR#:  [id number]   DOB:  [birth year]   Account #:  [id number]    Date of Consultation:  07/31/2016   Date of Adm:  07/30/2016       NEUROLOGY CONSULTATION     DATE OF CONSULTATION: 07/31/2016.    HISTORY OF PRESENT ILLNESS: This is a 40 year old gentleman   who presented to the emergency department after he fell down a flight   of stairs having a seizure. He is seen with his mother at the bedside   who aids in the history. Additional history is obtained from the chart.   Apparently, the patient's son heard him or saw him fall down a flight of   stairs, landing on his head, and called for the patient's mother who   arrived within seconds and found him having his typical seizure that   lasted for 5 minutes with tongue trauma. 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 His last seizure was in July. He is followed by Dr.   Hortencia Conradi at [facility]. No medications changes were made at that time.   He is on Vimpat 150 mg b.i.d. and Keppra 500 mg tablets, 3-1/2 tablets   b.i.d.    The patient's mom notes that with his typical seizures, he has left-sided   shaking with loss of awareness and loss of consciousness and prolonged post-ictal confusion. His CT of   the head in the ED showed right subdural hematoma. The C-spine CT   was unremarkable. His urine drug screen was positive for THC and   opiates. Vimpat level was ordered and pending. Sodium is 133. CBC   was unremarkable. The patient has been confused and agitated since   being in the ICU, but has not had any subsequent seizures.    PAST MEDICAL/SURGICAL HISTORY    1. 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   2. 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      REVIEW OF SYSTEMS: Cannot be obtained secondary to the   patient's mental status, but he is complaining of back pain.    MEDICATIONS AT HOME:   1. Zoloft.   2. Zantac.   3. Ativan 1 mg q.4h.   4. Effexor.   5. Zofran.   6. Keppra 1750 mg b.i.d.   7. Vimpat 150 mg b.i.d.    ALLERGIES: NONE.    SOCIAL HISTORY: He is a widower. His wife passed away last year. I   believe he has 2 children. He smokes and uses marijuana. No alcohol   Use. On disability after tumor and resection with Mom noting very poor short term memory.    FAMILY HISTORY: Unable to be obtained from the patient.    PHYSICAL EXAMINATION   VITAL SIGNS: Blood pressure 114/70, pulse 70, respiratory rate 23,   saturation 100% on room air, temperature is 97.8, and he is afebrile.   GENERAL: He is a well-nourished, well-developed, healthy-appearing   gentleman, lying in bed in some distress, trying to sit up and get out of   bed. He is complaining of being hot and then the next minute being cold.   HEENT: He has a craniotomy  scar on the right. He has a large   contusion to his right forehead.   HEART: Regular rate and rhythm without murmurs, gallops or rubs.   His carotids are 2+, no bruits.   EXTREMITIES: Warm. He has 2+ radial pulses. No edema.     NEUROLOGICAL EXAMINATION   MENTAL STATUS: He is alert. He is oriented [facility] being in a hospital[facility]. His speech is not   dysarthric. His language is intact. He is able to follow commands at   times; however, he is not completely appropriate, again, trying to get   out of bed, stating he wants to take a warm bath, and wants to walk   around.   CRANIAL NERVE EXAM: No asymmetry or ptosis. His extraocular eye   movements are intact. His visual fields are full. His pupils are equally   round and reactive. Tongue is midline. His palate elevates   symmetrically.   MOTOR EXAM: He has 5/5 strength throughout the right upper and    lower extremity. He has weakness in the left upper and lower   extremity, although it was difficult to get him to activate all muscles.   SENSORY: Sensory exam appear to be intact to light touch   throughout.    REFLEXES: Reflexes are 2+ and symmetric. His toes are downgoing.   COORDINATION: Coordination could not be assessed at this time.   GAIT: Gait could not be assessed at this time.    STUDIES AND REPORTS: Reviewed above in the HPI.    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 I will increase his Vimpat to 200 mg b.i.d. and continue his Keppra at   home dose of 1750 mg b.i.d. Will follow up again tomorrow.        Scarlett PrestoMARY Doshia Dalia, MD      MR / [facility]   D:  07/31/2016   22:53   T:  08/01/2016   03:10   Job #:  [id number]

## 2016-08-01 NOTE — Progress Notes (Signed)
Pulmonary / Critical Care  Hospital Day: 3  S:      9/18:  On precedex  Anti sz meds monitored by neuro  No sz over night - vimpat increased  NS following - may need evacuation      O:    Patient Vitals for the past 4 hrs:   BP Temp Pulse Resp SpO2 Weight   08/01/16 0800 110/60 97.9 ??F (36.6 ??C) 81 20 100 % -   08/01/16 0700 (!) 85/56 - 68 15 96 % -   08/01/16 0600 - - 78 23 97 % -   08/01/16 0554 107/76 - 83 13 98 % -   08/01/16 0514 110/82 - (!) 103 - - 65.1 kg (143 lb 8.3 oz)     Temp (24hrs), Avg:97.5 ??F (36.4 ??C), Min:97.1 ??F (36.2 ??C), Max:97.9 ??F (36.6 ??C)      Intake/Output Summary (Last 24 hours) at 08/01/16 0847  Last data filed at 08/01/16 0400   Gross per 24 hour   Intake            33.18 ml   Output             1150 ml   Net         -1116.82 ml     Glucose   Date Value Ref Range Status   08/01/2016 96 65 - 100 mg/dL Final   04/54/098109/17/2017 191103 (H) 65 - 100 mg/dL Final   47/82/956209/16/2017 130114 (H) 65 - 100 mg/dL Final   86/57/846906/12/2014 629112 (H) 65 - 100 mg/dL Final   52/84/132412/19/2015 401153 (H) 65 - 100 mg/dL Final     Exam:  Sleeping  No distress  Has left sided weakness  CTA  RRR    Data:   Interval lab data was reviewed.    Interval radiology images were independently viewed and available reports were reviewed       Lab:  Recent Labs      08/01/16   0510  07/31/16   0520  07/30/16   1631  07/30/16   1611  07/30/16   1037   WBC   --   14.5*  18.0*   --   10.7   HGB   --   12.4  12.7   --   14.3   PLT   --   277  260   --   303   NA  135*  135*   --    --   133*   K  3.6  3.8   --    --   3.5   CL  101  102   --    --   99   CO2  25  22   --    --   25   BUN  11  9   --    --   5*   CREA  0.63*  0.82   --    --   1.00   GLU  96  103*   --    --   114*   CA  9.1  9.1   --    --   8.6   MG   --   2.0   --    --   2.2   PHOS   --   2.7   --    --    --    INR   --    --    --  1.2*   --    TBILI   --    --    --    --   0.6   SGOT   --    --    --    --   24         A/P:     1. Posttraumatic subdural hematoma   2. Seizure disorder with breakthrough seizures - possible sz leading to fall down stairs with post traumatic SDH  3. History of cerebral tumor  4. Back pain    --AED's per neuro  --possible evacuation of SDH per NS  --on precedex  --Routine ICU care  --pepcid /SCD's    Jhonnie Garner, MD

## 2016-08-01 NOTE — Progress Notes (Addendum)
Shift Summary:  Patient oriented to person, place, time.  Does not recall falling at home.  MAE, left upper extremity pt c/o weakness and sometimes numbness.  Pain and anxiety difficult to control throughout morning.  Patient c/o severe back pain causing him to want to get OOB and "stretch".  Patient reoriented to severity of back injury and risks involved with not adhering to strict bedrest.  Patients mother at bedside this morning.  Patient had one episode of severe agitation, attempting multiple times to get OOB.  Dilaudid, ativan, percocet and precedex administered as ordered throughout shift, doses increased following agitation.      1900- Dr. Eulah Pont at bedside to assess patient, MD then discussed plan with patient at bedside and mother via telephone.  Telephone consent for T5 laminectomy with posterior instrumentation and fusion at T3-8 and blood transfusion verified by myself and Stevan Born. RN

## 2016-08-01 NOTE — Progress Notes (Signed)
Pt seen and examined.  Full strength in LE.  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 T5 burst fracture on CT.      Will need surgical stabilization.  Posted for OR tomorrow for posterior thoracic fusion.  Risks and benefits discussed with patient and his mother, Carl Howell.

## 2016-08-01 NOTE — Progress Notes (Signed)
Hospitalist Progress Note  Leonia Reevesharmesh R Damiano Stamper, MD  Office: [phone]  Cell: [phone]      Date of Service:  08/01/2016  NAME:  Carl Howell  DOB:  11-12-1976  MRN:  [id number]      Admission Summary:   40 yo male with PMhx of Rt Parietal occipital  Oligoastrocytoma s/p resection 2008, s/p chemo, seizure d/o, admitted for fall and witnessed seizures. [facility] showing SDH.     Interval history / Subjective:   Pt seen and examined  Agitated overnight  C/o back pain,   Per family usually has 8-9 hours of post-ictal period.   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  - repeat CT this AM stable  - Neurosurgery following, no indication for surgery  - neuro check  - monitor BP    Epilepsy with Breakthrough Seizure   - Keppra 1750mg  BID  - Vimpat increased to 200mg  BID  - Neuro eval appreciated  - seizure precaution  - ATIVAN prn    Intractable Back Pain due to Burst Fx of T5 due to fall  - CT L and T Spine noted  - Neurosurgery on board,   - BEDREST  - Pain management  - Surgery planning per Neurosurgical team    S/p Fall likely due to seizure  - PT/OT when stable    Hx of Oligoastrocytoma   - s/p resection 2008 and chemo  - MRI brain to evaluate recurrence     Leukocytosis  - improving  - no signs of infection    Mild Hyponatremia  - monitor     Depression   -c/w Zoloft and Effexor    Tobacco abuse  - Nicotine patch     Code status: FULL  DVT prophylaxis: SCDs    Care Plan discussed with: Patient/Family and Nurse  Disposition: Home w/Family     Hospital Problems  Date Reviewed: 07/31/2016          Codes Class Noted POA    Glioma (HCC) ICD-10-CM: C71.9  ICD-9-CM: 191.1  07/31/2016 Yes        Seizures (HCC) ICD-10-CM: R56.9  ICD-9-CM: 780.39  07/30/2016 Yes        * (Principal)Subdural hematoma (HCC) ICD-10-CM: I62.00  ICD-9-CM: 432.1  07/30/2016 Unknown         Epilepsy (HCC) ICD-10-CM: G40.909  ICD-9-CM: 345.90  Unknown Yes                Review of Systems:   A comprehensive review of systems was negative except for that written in the HPI.       Vital Signs:    Last 24hrs VS reviewed since prior progress note. Most recent are:  Visit Vitals   ??? BP 110/60 (BP 1 Location: Left arm, BP Patient Position: At rest)   ??? Pulse 81   ??? Temp 97.9 ??F (36.6 ??C)   ??? Resp 20   ??? Wt 65.1 kg (143 lb 8.3 oz)   ??? SpO2 100%   ??? BMI 20.02 kg/m2         Intake/Output Summary (Last 24 hours) at 08/01/16 0901  Last data filed at 08/01/16 0400   Gross per 24 hour   Intake            33.18 ml   Output  1150 ml   Net         -1116.82 ml        Physical Examination:             Constitutional:  No acute distress, cooperative, pleasant??   HEENT: Bruise on forehead, Oral mucous moist, oropharynx benign. Neck supple,    Resp:  CTA bilaterally.    CV:  Regular rhythm, normal rate,     GI:  Soft, non distended, non tender. normoactive bowel sounds,      Musculoskeletal:  No edema, warm, 2+ pulses throughout    Neurologic:  alert and alert, old Left facial droop and weakness in UE/LE,      Back: Significant upper back tenderness.        Data Review:    Review and/or order of clinical lab test  Review and/or order of tests in the radiology section of CPT  Review and/or order of tests in the medicine section of CPT      Labs:     Recent Labs      07/31/16   0520  07/30/16   1631   WBC  14.5*  18.0*   HGB  12.4  12.7   HCT  36.3*  36.4*   PLT  277  260     Recent Labs      08/01/16   0510  07/31/16   0520  07/30/16   1037   NA  135*  135*  133*   K  3.6  3.8  3.5   CL  101  102  99   CO2  25  22  25    BUN  11  9  5*   CREA  0.63*  0.82  1.00   GLU  96  103*  114*   CA  9.1  9.1  8.6   MG   --   2.0  2.2   PHOS   --   2.7   --      Recent Labs      07/30/16   1037   SGOT  24   ALT  25   AP  81   TBILI  0.6   TP  8.0   ALB  4.3   GLOB  3.7     Recent Labs      07/30/16   1611   INR  1.2*    PTP  12.0*   APTT  24.5      No results for input(s): FE, TIBC, PSAT, FERR in the last 72 hours.   No results found for: FOL, RBCF   No results for input(s): PH, PCO2, PO2 in the last 72 hours.  No results for input(s): CPK, CKNDX, TROIQ in the last 72 hours.    No lab exists for component: CPKMB  No results found for: CHOL, CHOLX, CHLST, CHOLV, HDL, LDL, LDLC, DLDLP, TGLX, TRIGL, TRIGP, CHHD, CHHDX  Lab Results   Component Value Date/Time    Glucose (POC) 146 07/30/2016 04:14 PM    Glucose (POC) 119 07/30/2016 10:13 AM    Glucose (POC) 88 12/06/2012 01:34 PM     Lab Results   Component Value Date/Time    Color YELLOW/STRAW 05/09/2014 02:48 PM    Appearance CLEAR 05/09/2014 02:48 PM    Specific gravity 1.020 05/09/2014 02:48 PM    pH (UA) 8.5 05/09/2014 02:48 PM    Protein 30 05/09/2014 02:48 PM    Glucose NEGATIVE  05/09/2014 02:48  PM    Ketone NEGATIVE  05/09/2014 02:48 PM    Bilirubin NEGATIVE  05/09/2014 02:48 PM    Urobilinogen 0.2 05/09/2014 02:48 PM    Nitrites NEGATIVE  05/09/2014 02:48 PM    Leukocyte Esterase NEGATIVE  05/09/2014 02:48 PM    Epithelial cells FEW 05/09/2014 02:48 PM    Bacteria NEGATIVE  05/09/2014 02:48 PM    WBC 0-4 05/09/2014 02:48 PM    RBC 0-5 05/09/2014 02:48 PM         Medications Reviewed:     Current Facility-Administered Medications   Medication Dose Route Frequency   ??? nicotine (NICODERM CQ) 21 mg/24 hr patch 1 Patch  1 Patch TransDERmal DAILY   ??? sodium chloride (NS) flush 5-10 mL  5-10 mL IntraVENous Q8H   ??? sodium chloride (NS) flush 5-10 mL  5-10 mL IntraVENous PRN   ??? acetaminophen (TYLENOL) tablet 650 mg  650 mg Oral Q4H PRN   ??? ondansetron (ZOFRAN) injection 4 mg  4 mg IntraVENous Q4H PRN   ??? levETIRAcetam (KEPPRA) tablet 1,750 mg  1,750 mg Oral BID   ??? LORazepam (ATIVAN) tablet 1 mg  1 mg Oral Q4H PRN   ??? famotidine (PEPCID) tablet 20 mg  20 mg Oral BID   ??? sertraline (ZOLOFT) tablet 100 mg  100 mg Oral DAILY    ??? venlafaxine-SR (EFFEXOR-XR) capsule 150 mg  150 mg Oral DAILY WITH BREAKFAST   ??? lacosamide (VIMPAT) tablet 200 mg  200 mg Oral BID   ??? HYDROmorphone (PF) (DILAUDID) injection 1 mg  1 mg IntraVENous Q3H PRN   ??? cyclobenzaprine (FLEXERIL) tablet 5 mg  5 mg Oral TID   ??? dexmedeTOMidine (PRECEDEX) 400 mcg in 0.9% sodium chloride 100 mL infusion  0.2-0.7 mcg/kg/hr IntraVENous TITRATE   ??? LORazepam (ATIVAN) injection 1 mg  1 mg IntraVENous Q2H PRN   ??? LORazepam (ATIVAN) injection 2 mg  2 mg IntraVENous Q4H PRN     ______________________________________________________________________  EXPECTED LENGTH OF STAY: - - -  ACTUAL LENGTH OF STAY:          2                 Leonia Reevesharmesh R Karyssa Amaral, MD

## 2016-08-01 NOTE — Progress Notes (Signed)
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 He was found to have a right SDH without subsequent seizures since admission with postictal Todd's paralysis on the left and ongoing prolonged postictal confusion, with possible contribution from SDH as well as pain medications he has received. Now with T5 burst fracture - neurosurgery has evaluated this. Exam with improved mental status, flaccid left UE, mild weakness in left LE compared to right.   Plan:   1. Continue Vimpat 200mg  bid  2. Continue Keppra 1750mg  bid    Chart reviewed since last seen  Subjective:   Pt found to have unstable T5 burst fracture and T6 fx on CT T-spine.  Denies numbness.  Still c/o having to stay in bed. States he wants to take a shower. No seizures overnight.    Objective:     Current Facility-Administered Medications   Medication Dose Route Frequency   ??? nicotine (NICODERM CQ) 21 mg/24 hr patch 1 Patch  1 Patch TransDERmal DAILY   ??? sodium chloride (NS) flush 5-10 mL  5-10 mL IntraVENous Q8H   ??? sodium chloride (NS) flush 5-10 mL  5-10 mL IntraVENous PRN   ??? acetaminophen (TYLENOL) tablet 650 mg  650 mg Oral Q4H PRN   ??? ondansetron (ZOFRAN) injection 4 mg  4 mg IntraVENous Q4H PRN   ??? levETIRAcetam (KEPPRA) tablet 1,750 mg  1,750 mg Oral BID   ??? LORazepam (ATIVAN) tablet 1 mg  1 mg Oral Q4H PRN   ??? famotidine (PEPCID) tablet 20 mg  20 mg Oral BID    ??? sertraline (ZOLOFT) tablet 100 mg  100 mg Oral DAILY   ??? venlafaxine-SR (EFFEXOR-XR) capsule 150 mg  150 mg Oral DAILY WITH BREAKFAST   ??? lacosamide (VIMPAT) tablet 200 mg  200 mg Oral BID   ??? HYDROmorphone (PF) (DILAUDID) injection 1 mg  1 mg IntraVENous Q3H PRN   ??? cyclobenzaprine (FLEXERIL) tablet 5 mg  5 mg Oral TID   ??? dexmedeTOMidine (PRECEDEX) 400 mcg in 0.9% sodium chloride 100 mL infusion  0.2-0.7 mcg/kg/hr IntraVENous TITRATE   ??? LORazepam (ATIVAN) injection 1 mg  1 mg IntraVENous Q2H PRN   ??? LORazepam (ATIVAN) injection 2 mg  2 mg IntraVENous Q4H PRN       Visit Vitals   ??? BP (!) 87/61   ??? Pulse 71   ??? Temp 97.9 ??F (36.6 ??C)   ??? Resp 17   ??? Wt 65.1 kg (143 lb 8.3 oz)   ??? SpO2 98%   ??? BMI 20.02 kg/m2     Temp (24hrs), Avg:97.5 ??F (36.4 ??C), Min:97.1 ??F (36.2 ??C), Max:97.9 ??F (36.6 ??C)      09/18 0701 - 09/18 1900  In: 36.7 [I.V.:36.7]  Out: -   09/16 1901 - 09/18 0700  In: 33.2 [I.V.:33.2]  Out: 1250 [Urine:1250]      Physical Exam:  General: Well developed well nourished patient in no apparent distress.  Cardiac: Regular rate and rhythm with no murmurs.   Extremities: 2+ Radial pulses, no cyanosis or edema    Neurological Exam:  Mental Status: Oriented to person, hospital - [employer], Month, has difficulty with year as falling asleep, Speech and language intact. Attention and fund of knowledge appropriate.  Normal recent and remote memory.   Cranial Nerves:   PERRL, EOMI, no nystagmus, no ptosis. Facial movement is symmetric.   Hearing is intact bilaterally.   Motor:  5/5 strength in right upper and lower proximal and distal muscles. Left UE is flaccid.  Left LE 4/5 HF and DF. No tremors   Reflexes:      Sensory:      Gait:  Non-ambulatory currently   Cerebellar:           Lab Review   Recent Results (from the past 24 hour(s))   METABOLIC PANEL, BASIC    Collection Time: 08/01/16  5:10 AM   Result Value Ref Range    Sodium 135 (L) 136 - 145 mmol/L    Potassium 3.6 3.5 - 5.1 mmol/L     Chloride 101 97 - 108 mmol/L    CO2 25 21 - 32 mmol/L    Anion gap 9 5 - 15 mmol/L    Glucose 96 65 - 100 mg/dL    BUN 11 6 - 20 MG/DL    Creatinine 1.61 (L) 0.70 - 1.30 MG/DL    BUN/Creatinine ratio 17 12 - 20      GFR est AA >60 >60 ml/min/1.43m2    GFR est non-AA >60 >60 ml/min/1.73m2    Calcium 9.1 8.5 - 10.1 MG/DL       Additional comments:  I have reviewed the patient's new clinical lab test results.  I have personally reviewed the patient's radiographs.  CT scan   CT Results (most recent):  Final result (Exam End: 07/31/2016 ??9:00 AM) Open    Study Result   HEAD CT WITHOUT CONTRAST: 07/31/2016 9:00 AM  ??  INDICATION: Sub-dural hemorrhage  ??  COMPARISON: 07/30/2016.  ??  PROCEDURE: Axial images of the head were obtained without contrast. Coronal and  sagittal reformats were performed. CT dose reduction was achieved through use of  a standardized protocol tailored for this examination and automatic exposure  control for dose modulation.  Adaptive statistical iterative reconstruction  (ASIR) was utilized.  ??  FINDINGS: There is chronic encephalomalacia in the right parietal and occipital  lobes post right parietal craniotomy. Small right parietal subdural hemorrhage  is stable. The axial obliquity of the scan mimics midline shift, however, there  is no significant right to left midline shift. No new hemorrhage. The ventricles  and sulci are appropriate in size and configuration for age. No new loss of  gray-white differentiation to suggest late acute or early subacute infarction.  An oval, circumscribed, superficial skin lesion superior to the right parotid  likely represents a sebaceous cyst.  ??  IMPRESSION  IMPRESSION: Stable small right parietal subdural hemorrhage.           Results from Hospital Encounter encounter on 07/30/16   CT SPINE LUMB WO CONT   Narrative CT OF THE THORACIC AND LUMBAR SPINE WITHOUT CONTRAST: 07/31/2016 5:43 PM    INDICATION: Intractable back pain after fall from 8 feet. Thoracic  compression  fracture on radiography.    TECHNIQUE: Axial CT images were obtained of the thoracic and lumbar spine  without contrast. Coronal and sagittal reconstructions were performed. CT dose  reduction was achieved through use of a standardized protocol tailored for this  examination and automatic exposure control for dose modulation. Adaptive  statistical iterative reconstruction (ASIR) was utilized.    COMPARISON: Same day thoracolumbar radiographs.    FINDINGS:   A heavily comminuted burst fracture of the T5 vertebral body fracture extends  through the anterior, middle, and posterior columns, with a fracture of the  spinous process. This is an unstable fracture. Retropulsion causes mild canal  stenosis and mild right neural foraminal stenosis. Mild paraspinous hemorrhage  and a left proximal rib fracture are associated. There is a more subtle fracture  of T6 (2-207, 2-214), with trace paraspinous hemorrhage on image 3-51. There is  trace paraspinous hemorrhage on the right at T7 as well (3-59) without a clear  fracture. Ossicles of the tips of the T3-T7 spinous processes mimic fractures  but are normal variants. Failure of fusion of the posterior elements of T12 and  L1 are also normal variants. Mild dependent atelectasis and minimal pleural  fluid.     Motion limited. No displaced fracture. Lumbar alignment is normal. Vertebral  body heights are normal. Incidental note is made of scattered hepatic cysts.  L4-S1 facet osteoarthritis probably causes neural foraminal stenosis, though  motion limits evaluation.         Impression IMPRESSION:   1. Heavily comminuted T5 burst fracture involving all 3 columns is unstable.  Surgical consultation recommended. Retropulsion causes mild canal and mild right  neural foraminal stenosis.  2. Subtle, nondisplaced T6 fracture. Trace paraspinous hemorrhage adjacent T7  without visible fracture.  3. No displaced lumbar fracture; lumbar evaluation limited by motion.     The findings were called to Dr. Penelope Galasharmesh Sanghani on 07/31/2016 6:04 PM by Dr.  Reed Breechhris [place].  [phone]            Care Plan discussed with:  Patient x   Family x   RN    Care Manager    Consultant/Specialist:  x     Signed:Jodi MourningMary M Karey Stucki, MD

## 2016-08-02 ENCOUNTER — Inpatient Hospital Stay: Admit: 2016-08-02 | Payer: PRIVATE HEALTH INSURANCE | Primary: Family Medicine

## 2016-08-02 LAB — CBC WITH AUTOMATED DIFF
ABS. BASOPHILS: 0 10*3/uL (ref 0.0–0.1)
ABS. EOSINOPHILS: 0.1 10*3/uL (ref 0.0–0.4)
ABS. LYMPHOCYTES: 1.6 10*3/uL (ref 0.8–3.5)
ABS. MONOCYTES: 0.9 10*3/uL (ref 0.0–1.0)
ABS. NEUTROPHILS: 4.5 10*3/uL (ref 1.8–8.0)
BASOPHILS: 0 % (ref 0–1)
EOSINOPHILS: 1 % (ref 0–7)
HCT: 34.5 % — ABNORMAL LOW (ref 36.6–50.3)
HGB: 11.6 g/dL — ABNORMAL LOW (ref 12.1–17.0)
LYMPHOCYTES: 23 % (ref 12–49)
MCH: 31.1 PG (ref 26.0–34.0)
MCHC: 33.6 g/dL (ref 30.0–36.5)
MCV: 92.5 FL (ref 80.0–99.0)
MONOCYTES: 13 % (ref 5–13)
NEUTROPHILS: 63 % (ref 32–75)
PLATELET: 210 10*3/uL (ref 150–400)
RBC: 3.73 M/uL — ABNORMAL LOW (ref 4.10–5.70)
RDW: 12.5 % (ref 11.5–14.5)
WBC: 7.1 10*3/uL (ref 4.1–11.1)

## 2016-08-02 LAB — METABOLIC PANEL, BASIC
Anion gap: 10 mmol/L (ref 5–15)
BUN/Creatinine ratio: 17 (ref 12–20)
BUN: 12 MG/DL (ref 6–20)
CO2: 23 mmol/L (ref 21–32)
Calcium: 8.6 MG/DL (ref 8.5–10.1)
Chloride: 101 mmol/L (ref 97–108)
Creatinine: 0.72 MG/DL (ref 0.70–1.30)
GFR est AA: >60 mL/min/{1.73_m2} (ref >60)
GFR est non-AA: >60 mL/min/{1.73_m2} (ref >60)
Glucose: 91 mg/dL (ref 65–100)
Potassium: 3.9 mmol/L (ref 3.5–5.1)
Sodium: 134 mmol/L — ABNORMAL LOW (ref 136–145)

## 2016-08-02 LAB — PTT: aPTT: 28.6 s (ref 22.1–32.5)

## 2016-08-02 LAB — LACOSAMIDE: Lacosamide: NOT DETECTED ug/mL (ref 5.0–10.0)

## 2016-08-02 LAB — PROTHROMBIN TIME + INR
INR: 1 (ref 0.9–1.1)
Prothrombin time: 10.3 s (ref 9.0–11.1)

## 2016-08-02 MED ORDER — MIDAZOLAM 1 MG/ML IJ SOLN
1 mg/mL | INTRAMUSCULAR | Status: DC | PRN
Start: 2016-08-02 — End: 2016-08-02
  Administered 2016-08-02: 16:00:00 via INTRAVENOUS

## 2016-08-02 MED ORDER — MIDAZOLAM 1 MG/ML IJ SOLN
1 mg/mL | INTRAMUSCULAR | Status: DC | PRN
Start: 2016-08-02 — End: 2016-08-02

## 2016-08-02 MED ORDER — SODIUM CHLORIDE 0.9 % IRRIGATION SOLN
0.9 % | Status: DC | PRN
Start: 2016-08-02 — End: 2016-08-02
  Administered 2016-08-02: 17:00:00

## 2016-08-02 MED ORDER — NICARDIPINE 2.5 MG/ML IV
25 mg/10 mL | INTRAVENOUS | Status: DC
Start: 2016-08-02 — End: 2016-08-03

## 2016-08-02 MED ORDER — DEXAMETHASONE SODIUM PHOSPHATE 4 MG/ML IJ SOLN
4 mg/mL | INTRAMUSCULAR | Status: DC | PRN
Start: 2016-08-02 — End: 2016-08-02
  Administered 2016-08-02: 17:00:00 via INTRAVENOUS

## 2016-08-02 MED ORDER — ROCURONIUM 10 MG/ML IV
10 mg/mL | INTRAVENOUS | Status: DC | PRN
Start: 2016-08-02 — End: 2016-08-02
  Administered 2016-08-02 (×4): via INTRAVENOUS

## 2016-08-02 MED ORDER — FENTANYL CITRATE (PF) 50 MCG/ML IJ SOLN
50 mcg/mL | INTRAMUSCULAR | Status: AC
Start: 2016-08-02 — End: ?

## 2016-08-02 MED ORDER — SODIUM CHLORIDE 0.9 % IV
200 mg/20 mL | Freq: Two times a day (BID) | INTRAVENOUS | Status: DC
Start: 2016-08-02 — End: 2016-08-03
  Administered 2016-08-02 – 2016-08-03 (×3): via INTRAVENOUS

## 2016-08-02 MED ORDER — CEFAZOLIN 2 G IN 100 ML 0.9% NS
2 gram/100 mL | INTRAVENOUS | Status: DC | PRN
Start: 2016-08-02 — End: 2016-08-02
  Administered 2016-08-02: 17:00:00 via INTRAVENOUS

## 2016-08-02 MED ORDER — SODIUM CHLORIDE 0.9 % IV
INTRAVENOUS | Status: DC
Start: 2016-08-02 — End: 2016-08-02
  Administered 2016-08-02: 16:00:00 via INTRAVENOUS

## 2016-08-02 MED ORDER — LACTATED RINGERS IV
INTRAVENOUS | Status: DC
Start: 2016-08-02 — End: 2016-08-02
  Administered 2016-08-02: 16:00:00 via INTRAVENOUS

## 2016-08-02 MED ORDER — SUCCINYLCHOLINE CHLORIDE 20 MG/ML INJECTION
20 mg/mL | INTRAMUSCULAR | Status: DC | PRN
Start: 2016-08-02 — End: 2016-08-02
  Administered 2016-08-02: 17:00:00 via INTRAVENOUS

## 2016-08-02 MED ORDER — MIDAZOLAM 1 MG/ML IJ SOLN
1 mg/mL | INTRAMUSCULAR | Status: AC
Start: 2016-08-02 — End: ?

## 2016-08-02 MED ORDER — NS WITH POTASSIUM CHLORIDE 20 MEQ/L IV
20 mEq/L | INTRAVENOUS | Status: DC
Start: 2016-08-02 — End: 2016-08-02
  Administered 2016-08-02: 22:00:00 via INTRAVENOUS

## 2016-08-02 MED ORDER — MORPHINE (PF) 150 MG/30 ML PCA IV SOLN (4 HOUR LOCKOUT)
150 mg/30 mL (5 mg/mL) | INTRAVENOUS | Status: DC
Start: 2016-08-02 — End: 2016-08-04
  Administered 2016-08-02 – 2016-08-04 (×7): via INTRAVENOUS

## 2016-08-02 MED ORDER — MEPERIDINE (PF) 25 MG/ML INJ SOLUTION
25 mg/ml | Freq: Once | INTRAMUSCULAR | Status: AC
Start: 2016-08-02 — End: 2016-08-02
  Administered 2016-08-02: 22:00:00 via INTRAVENOUS

## 2016-08-02 MED ORDER — LEVETIRACETAM 500 MG/5 ML IV SOLN
500 mg/5 mL | Freq: Two times a day (BID) | INTRAVENOUS | Status: DC
Start: 2016-08-02 — End: 2016-08-03
  Administered 2016-08-02 – 2016-08-03 (×4): via INTRAVENOUS

## 2016-08-02 MED ORDER — SODIUM CHLORIDE 0.9 % IV
INTRAVENOUS | Status: DC | PRN
Start: 2016-08-02 — End: 2016-08-02
  Administered 2016-08-02: 20:00:00 via INTRAVENOUS

## 2016-08-02 MED ORDER — ONDANSETRON (PF) 4 MG/2 ML INJECTION
4 mg/2 mL | INTRAMUSCULAR | Status: DC | PRN
Start: 2016-08-02 — End: 2016-08-02

## 2016-08-02 MED ORDER — OXYCODONE-ACETAMINOPHEN 5 MG-325 MG TAB
5-325 mg | ORAL | Status: DC | PRN
Start: 2016-08-02 — End: 2016-08-02

## 2016-08-02 MED ORDER — LIDOCAINE (PF) 10 MG/ML (1 %) IJ SOLN
10 mg/mL (1 %) | INTRAMUSCULAR | Status: DC | PRN
Start: 2016-08-02 — End: 2016-08-02
  Administered 2016-08-02: 16:00:00 via SUBCUTANEOUS

## 2016-08-02 MED ORDER — GLYCOPYRROLATE 0.2 MG/ML IJ SOLN
0.2 mg/mL | INTRAMUSCULAR | Status: DC | PRN
Start: 2016-08-02 — End: 2016-08-02
  Administered 2016-08-02: 20:00:00 via INTRAVENOUS

## 2016-08-02 MED ORDER — PROPOFOL 10 MG/ML IV EMUL
10 mg/mL | INTRAVENOUS | Status: DC | PRN
Start: 2016-08-02 — End: 2016-08-02
  Administered 2016-08-02 (×3): via INTRAVENOUS

## 2016-08-02 MED ORDER — FENTANYL CITRATE (PF) 50 MCG/ML IJ SOLN
50 mcg/mL | INTRAMUSCULAR | Status: DC | PRN
Start: 2016-08-02 — End: 2016-08-02
  Administered 2016-08-02 (×3): via INTRAVENOUS

## 2016-08-02 MED ORDER — LACTATED RINGERS IV
INTRAVENOUS | Status: DC
Start: 2016-08-02 — End: 2016-08-02
  Administered 2016-08-02: 21:00:00 via INTRAVENOUS

## 2016-08-02 MED ORDER — HYDROMORPHONE (PF) 1 MG/ML IJ SOLN
1 mg/mL | INTRAMUSCULAR | Status: DC | PRN
Start: 2016-08-02 — End: 2016-08-02
  Administered 2016-08-02 (×4): via INTRAVENOUS

## 2016-08-02 MED ORDER — CEFAZOLIN 2 GRAM/50 ML NS IVPB
Freq: Three times a day (TID) | INTRAVENOUS | Status: AC
Start: 2016-08-02 — End: 2016-08-03
  Administered 2016-08-02 – 2016-08-03 (×3): via INTRAVENOUS

## 2016-08-02 MED ORDER — SODIUM CHLORIDE 0.9 % IJ SYRG
INTRAMUSCULAR | Status: DC | PRN
Start: 2016-08-02 — End: 2016-08-08

## 2016-08-02 MED ORDER — SODIUM CHLORIDE 0.9 % IJ SYRG
INTRAMUSCULAR | Status: DC | PRN
Start: 2016-08-02 — End: 2016-08-02

## 2016-08-02 MED ORDER — FENTANYL CITRATE (PF) 50 MCG/ML IJ SOLN
50 mcg/mL | INTRAMUSCULAR | Status: DC | PRN
Start: 2016-08-02 — End: 2016-08-02

## 2016-08-02 MED ORDER — ONDANSETRON (PF) 4 MG/2 ML INJECTION
4 mg/2 mL | INTRAMUSCULAR | Status: DC | PRN
Start: 2016-08-02 — End: 2016-08-02
  Administered 2016-08-02: 16:00:00 via INTRAVENOUS

## 2016-08-02 MED ORDER — EPHEDRINE SULFATE 50 MG/ML IJ SOLN
50 mg/mL | INTRAMUSCULAR | Status: DC | PRN
Start: 2016-08-02 — End: 2016-08-02

## 2016-08-02 MED ORDER — THROMBIN 20,000 UNIT TOPICAL SOLUTION
20000 unit | CUTANEOUS | Status: DC | PRN
Start: 2016-08-02 — End: 2016-08-02
  Administered 2016-08-02: 18:00:00 via TOPICAL

## 2016-08-02 MED ORDER — SODIUM CHLORIDE 0.9 % IJ SYRG
Freq: Three times a day (TID) | INTRAMUSCULAR | Status: DC
Start: 2016-08-02 — End: 2016-08-08
  Administered 2016-08-03 – 2016-08-08 (×17): via INTRAVENOUS

## 2016-08-02 MED ORDER — HYDROMORPHONE (PF) 1 MG/ML IJ SOLN
1 mg/mL | INTRAMUSCULAR | Status: AC
Start: 2016-08-02 — End: ?

## 2016-08-02 MED ORDER — SODIUM CHLORIDE 0.9 % IV
INTRAVENOUS | Status: DC
Start: 2016-08-02 — End: 2016-08-02
  Administered 2016-08-02: 21:00:00 via INTRAVENOUS

## 2016-08-02 MED ORDER — NEOSTIGMINE METHYLSULFATE 1 MG/ML INJECTION
1 mg/mL | INTRAMUSCULAR | Status: DC | PRN
Start: 2016-08-02 — End: 2016-08-02
  Administered 2016-08-02: 20:00:00 via INTRAVENOUS

## 2016-08-02 MED ORDER — LACTATED RINGERS IV
INTRAVENOUS | Status: DC | PRN
Start: 2016-08-02 — End: 2016-08-02
  Administered 2016-08-02: 17:00:00 via INTRAVENOUS

## 2016-08-02 MED ORDER — FENTANYL CITRATE (PF) 50 MCG/ML IJ SOLN
50 mcg/mL | INTRAMUSCULAR | Status: AC | PRN
Start: 2016-08-02 — End: 2016-08-02
  Administered 2016-08-02 (×4): via INTRAVENOUS

## 2016-08-02 MED ORDER — HYDROMORPHONE (PF) 1 MG/ML IJ SOLN
1 mg/mL | INTRAMUSCULAR | Status: DC | PRN
Start: 2016-08-02 — End: 2016-08-02
  Administered 2016-08-02 (×4): via INTRAVENOUS

## 2016-08-02 MED ORDER — MORPHINE 10 MG/ML INJ SOLUTION
10 mg/ml | INTRAMUSCULAR | Status: DC | PRN
Start: 2016-08-02 — End: 2016-08-02

## 2016-08-02 MED ORDER — SODIUM CHLORIDE 0.9 % IV
INTRAVENOUS | Status: DC | PRN
Start: 2016-08-02 — End: 2016-08-08

## 2016-08-02 MED ORDER — DIPHENHYDRAMINE HCL 50 MG/ML IJ SOLN
50 mg/mL | INTRAMUSCULAR | Status: DC | PRN
Start: 2016-08-02 — End: 2016-08-02

## 2016-08-02 MED ORDER — DEXMEDETOMIDINE 400 MCG/100 ML (4 MCG/ML) IN 0.9 % SODIUM CHLORIDE IV
400 mcg/100 mL (4 mcg/mL) | INTRAVENOUS | Status: DC | PRN
Start: 2016-08-02 — End: 2016-08-02
  Administered 2016-08-02 (×5): via INTRAVENOUS

## 2016-08-02 MED ORDER — LORAZEPAM 2 MG/ML IJ SOLN
2 mg/mL | INTRAMUSCULAR | Status: DC | PRN
Start: 2016-08-02 — End: 2016-08-03
  Administered 2016-08-03 (×3): via INTRAVENOUS

## 2016-08-02 MED ORDER — GELATIN ADSORBABLE 100 TOPICAL SPONGE
100 | CUTANEOUS | Status: DC | PRN
Start: 2016-08-02 — End: 2016-08-02
  Administered 2016-08-02: 18:00:00 via TOPICAL

## 2016-08-02 MED FILL — NORMAL SALINE FLUSH 0.9 % INJECTION SYRINGE: INTRAMUSCULAR | Qty: 10

## 2016-08-02 MED FILL — FENTANYL CITRATE (PF) 50 MCG/ML IJ SOLN: 50 mcg/mL | INTRAMUSCULAR | Qty: 4

## 2016-08-02 MED FILL — PRECEDEX 100 MCG/ML INTRAVENOUS SOLUTION: 100 mcg/mL | INTRAVENOUS | Qty: 4

## 2016-08-02 MED FILL — SODIUM CHLORIDE 0.9 % IV: INTRAVENOUS | Qty: 1000

## 2016-08-02 MED FILL — HYDROMORPHONE (PF) 1 MG/ML IJ SOLN: 1 mg/mL | INTRAMUSCULAR | Qty: 1

## 2016-08-02 MED FILL — FENTANYL CITRATE (PF) 50 MCG/ML IJ SOLN: 50 mcg/mL | INTRAMUSCULAR | Qty: 2

## 2016-08-02 MED FILL — SODIUM CHLORIDE 0.9 % IV: INTRAVENOUS | Qty: 250

## 2016-08-02 MED FILL — OXYCODONE-ACETAMINOPHEN 10 MG-325 MG TAB: 10-325 mg | ORAL | Qty: 1

## 2016-08-02 MED FILL — VIMPAT 200 MG/20 ML INTRAVENOUS SOLUTION: 200 mg/20 mL | INTRAVENOUS | Qty: 20

## 2016-08-02 MED FILL — LEVETIRACETAM 250 MG TAB: 250 mg | ORAL | Qty: 1

## 2016-08-02 MED FILL — LEVETIRACETAM 500 MG/5 ML IV SOLN: 500 mg/5 mL | INTRAVENOUS | Qty: 17.5

## 2016-08-02 MED FILL — LACTATED RINGERS IV: INTRAVENOUS | Qty: 1000

## 2016-08-02 MED FILL — NICARDIPINE 2.5 MG/ML IV: 25 mg/10 mL | INTRAVENOUS | Qty: 10

## 2016-08-02 MED FILL — FAMOTIDINE 20 MG TAB: 20 mg | ORAL | Qty: 1

## 2016-08-02 MED FILL — MORPHINE (PF) 150 MG/30 ML CONCENTRATED INFUSION: 150 mg/30 mL | INTRAVENOUS | Qty: 30

## 2016-08-02 MED FILL — CEFAZOLIN 2 GRAM/50 ML NS IVPB: INTRAVENOUS | Qty: 50

## 2016-08-02 MED FILL — MIDAZOLAM 1 MG/ML IJ SOLN: 1 mg/mL | INTRAMUSCULAR | Qty: 2

## 2016-08-02 MED FILL — HYDROMORPHONE (PF) 1 MG/ML IJ SOLN: 1 mg/mL | INTRAMUSCULAR | Qty: 2

## 2016-08-02 MED FILL — MEPERIDINE (PF) 25MG/ML INJECTION: 25 mg/mL | INTRAMUSCULAR | Qty: 1

## 2016-08-02 MED FILL — CYCLOBENZAPRINE 10 MG TAB: 10 mg | ORAL | Qty: 1

## 2016-08-02 MED FILL — NS WITH POTASSIUM CHLORIDE 20 MEQ/L IV: 20 mEq/L | INTRAVENOUS | Qty: 1000

## 2016-08-02 MED FILL — NICOTINE 21 MG/24 HR DAILY PATCH: 21 mg/24 hr | TRANSDERMAL | Qty: 1

## 2016-08-02 NOTE — Progress Notes (Signed)
Hospitalist Progress Note  Leonia Reevesharmesh R Dannon Perlow, MD  Office: [phone]  Cell: [phone]      Date of Service:  08/02/2016  NAME:  Carl Howell  DOB:  [birth year]  MRN:  [id number]      Admission Summary:   40 yo male with PMhx of Rt Parietal occipital  Oligoastrocytoma s/p resection 2008, s/p chemo, seizure d/o, admitted for fall and witnessed seizures. Pt was transferred to [facility] for CT showing SDH.     Interval history / Subjective:   Pt seen and examined  Much calmer this AM  Awake and alert   Aware of surgery later today for his T5 fracture     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  - repeat CT this AM stable  - Neurosurgery following, no indication for surgery  - neuro check  - monitor BP    Epilepsy with Breakthrough Seizure   - Keppra 1750mg  BID  - Vimpat increased to 200mg  BID  - Neuro eval appreciated  - seizure precaution  - ATIVAN prn    Intractable Back Pain due to Unstable Burst Fx of T5 due to fall  - CT L and T Spine noted  - Neurosurgery on board,   - BEDREST  - Pain management  - plan for Posterior Thoracic fusion today    S/p Fall likely due to seizure  - PT/OT when stable    Hx of Oligoastrocytoma   - s/p resection 2008 and chemo  - MRI brain to evaluate recurrence     Leukocytosis  - improving  - no signs of infection    Mild Hyponatremia  - monitor     Depression   -c/w Zoloft and Effexor    Tobacco abuse  - Nicotine patch     Code status: FULL  DVT prophylaxis: SCDs    Care Plan discussed with: Patient/Family and Nurse and Dr.Murphy  Disposition: Home w/Family     Hospital Problems  Date Reviewed: 07/31/2016          Codes Class Noted POA    Closed unstable burst fracture of fifth thoracic vertebra (HCC) ICD-10-CM: M01.027OS22.052A  ICD-9-CM: 805.2  08/01/2016 Yes        Glioma (HCC) ICD-10-CM: C71.9  ICD-9-CM: 191.1  07/31/2016 Yes        Seizures (HCC) ICD-10-CM: R56.9   ICD-9-CM: 780.39  07/30/2016 Yes        * (Principal)Subdural hematoma (HCC) ICD-10-CM: I62.00  ICD-9-CM: 432.1  07/30/2016 Unknown        Epilepsy (HCC) ICD-10-CM: G40.909  ICD-9-CM: 345.90  Unknown Yes                Review of Systems:   A comprehensive review of systems was negative except for that written in the HPI.       Vital Signs:    Last 24hrs VS reviewed since prior progress note. Most recent are:  Visit Vitals   ??? BP 123/80   ??? Pulse 82   ??? Temp 97.5 ??F (36.4 ??C)   ??? Resp 17   ??? Wt 63.1 kg (139 lb 1.8 oz)   ??? SpO2 100%   ??? BMI 19.4 kg/m2         Intake/Output Summary (Last 24 hours) at 08/02/16 1016  Last data filed at 08/02/16 0800   Gross per 24 hour   Intake           336.38 ml  Output              700 ml   Net          -363.62 ml        Physical Examination:             Constitutional:  No acute distress, cooperative, pleasant??   HEENT: Bruise on forehead, Oral mucous moist, oropharynx benign. Neck supple,    Resp:  CTA bilaterally.    CV:  Regular rhythm, normal rate,     GI:  Soft, non distended, non tender. normoactive bowel sounds,      Musculoskeletal:  No edema, warm, 2+ pulses throughout    Neurologic:  alert and alert, old Left facial droop and weakness in UE/LE,      Back: Significant upper back tenderness.        Data Review:    Review and/or order of clinical lab test  Review and/or order of tests in the radiology section of CPT  Review and/or order of tests in the medicine section of CPT      Labs:     Recent Labs      08/02/16   0309  07/31/16   0520   WBC  7.1  14.5*   HGB  11.6*  12.4   HCT  34.5*  36.3*   PLT  210  277     Recent Labs      08/02/16   0309  08/01/16   0510  07/31/16   0520  07/30/16   1037   NA  134*  135*  135*  133*   K  3.9  3.6  3.8  3.5   CL  101  101  102  99   CO2  23  25  22  25    BUN  12  11  9   5*   CREA  0.72  0.63*  0.82  1.00   GLU  91  96  103*  114*   CA  8.6  9.1  9.1  8.6   MG   --    --   2.0  2.2   PHOS   --    --   2.7   --      Recent Labs       07/30/16   1037   SGOT  24   ALT  25   AP  81   TBILI  0.6   TP  8.0   ALB  4.3   GLOB  3.7     Recent Labs      08/02/16   0309  07/30/16   1611   INR  1.0  1.2*   PTP  10.3  12.0*   APTT  28.6  24.5      No results for input(s): FE, TIBC, PSAT, FERR in the last 72 hours.   No results found for: FOL, RBCF   No results for input(s): PH, PCO2, PO2 in the last 72 hours.  No results for input(s): CPK, CKNDX, TROIQ in the last 72 hours.    No lab exists for component: CPKMB  No results found for: CHOL, CHOLX, CHLST, CHOLV, HDL, LDL, LDLC, DLDLP, TGLX, TRIGL, TRIGP, CHHD, CHHDX  Lab Results   Component Value Date/Time    Glucose (POC) 146 07/30/2016 04:14 PM    Glucose (POC) 119 07/30/2016 10:13 AM    Glucose (POC) 88 12/06/2012 01:34 PM     Lab Results  Component Value Date/Time    Color YELLOW/STRAW 05/09/2014 02:48 PM    Appearance CLEAR 05/09/2014 02:48 PM    Specific gravity 1.020 05/09/2014 02:48 PM    pH (UA) 8.5 05/09/2014 02:48 PM    Protein 30 05/09/2014 02:48 PM    Glucose NEGATIVE  05/09/2014 02:48 PM    Ketone NEGATIVE  05/09/2014 02:48 PM    Bilirubin NEGATIVE  05/09/2014 02:48 PM    Urobilinogen 0.2 05/09/2014 02:48 PM    Nitrites NEGATIVE  05/09/2014 02:48 PM    Leukocyte Esterase NEGATIVE  05/09/2014 02:48 PM    Epithelial cells FEW 05/09/2014 02:48 PM    Bacteria NEGATIVE  05/09/2014 02:48 PM    WBC 0-4 05/09/2014 02:48 PM    RBC 0-5 05/09/2014 02:48 PM         Medications Reviewed:     Current Facility-Administered Medications   Medication Dose Route Frequency   ??? lacosamide (VIMPAT) 200 mg in 0.9% sodium chloride IVPB  200 mg IntraVENous BID   ??? levETIRAcetam (KEPPRA) 1,750 mg in 0.9% sodium chloride IVPB  1,750 mg IntraVENous Q12H   ??? 0.9% sodium chloride infusion 250 mL  250 mL IntraVENous PRN   ??? nicotine (NICODERM CQ) 21 mg/24 hr patch 1 Patch  1 Patch TransDERmal DAILY   ??? HYDROmorphone (PF) (DILAUDID) injection 2 mg  2 mg IntraVENous Q3H PRN    ??? oxyCODONE-acetaminophen (PERCOCET 10) 10-325 mg per tablet 1 Tab  1 Tab Oral Q4H PRN   ??? sodium chloride (NS) flush 5-10 mL  5-10 mL IntraVENous Q8H   ??? sodium chloride (NS) flush 5-10 mL  5-10 mL IntraVENous PRN   ??? acetaminophen (TYLENOL) tablet 650 mg  650 mg Oral Q4H PRN   ??? ondansetron (ZOFRAN) injection 4 mg  4 mg IntraVENous Q4H PRN   ??? LORazepam (ATIVAN) tablet 1 mg  1 mg Oral Q4H PRN   ??? famotidine (PEPCID) tablet 20 mg  20 mg Oral BID   ??? sertraline (ZOLOFT) tablet 100 mg  100 mg Oral DAILY   ??? venlafaxine-SR (EFFEXOR-XR) capsule 150 mg  150 mg Oral DAILY WITH BREAKFAST   ??? cyclobenzaprine (FLEXERIL) tablet 5 mg  5 mg Oral TID   ??? dexmedeTOMidine (PRECEDEX) 400 mcg in 0.9% sodium chloride 100 mL infusion  0.2-1.4 mcg/kg/hr IntraVENous TITRATE   ??? LORazepam (ATIVAN) injection 1 mg  1 mg IntraVENous Q2H PRN   ??? LORazepam (ATIVAN) injection 2 mg  2 mg IntraVENous Q4H PRN     ______________________________________________________________________  EXPECTED LENGTH OF STAY: 3d 7h  ACTUAL LENGTH OF STAY:          3                 [name], MD

## 2016-08-02 NOTE — Progress Notes (Signed)
Spiritual Care Assessment/Progress Notes    Carl Howell [id number]  [id number]    March 02, 1976  40 y.o.  male    Patient Telephone Number: [phone] (home)   Religious Affiliation: No religion   Language: English   Extended Emergency Contact Information  Primary Emergency Contact: [facility] [place]  Home Phone: [phone]  Relation: Parent   Patient Active Problem List    Diagnosis Date Noted   ??? Closed unstable burst fracture of fifth thoracic vertebra (HCC) 08/01/2016   ??? Glioma (HCC) 07/31/2016   ??? Seizures (HCC) 07/30/2016   ??? Subdural hematoma (HCC) 07/30/2016   ??? 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           Not able to assess due to medical condition    Services Provided Today:           crisis intervention             reading Scriptures           spiritual assessment             prayer           empathic listening/emotional support           rites and rituals (cite in comments)           life review              religious support           theological development            advocacy           ethical dialog              blessing           bereavement support             support to family           anticipatory grief support            help with AMD           spiritual guidance             meditation      Spiritual Care Needs           Emotional Support           Spiritual/Religious Care           Loss/Adjustment           Advocacy/Referral                /Ethics           No needs expressed  at                this time           Other: (note in               comments)  Spiritual Care Plan           Follow up visits with               pt/family           Provide materials           Schedule sacraments           Contact Community               Clergy           Follow up as needed           Other: (note in               comments)     Comments: Attempted to visit Carl Howell in ICU-10 for initial spiritual assessment. Carl Howell had been taken to OR and no family was present at that time. Left note at bedside assuring patient and family of prayers on their behalf and of chaplain availability for support.   Chaplain: Rev. Cyndia Diverachel K. Cobb, MDiv; BCC, to contact Spiritual Care Services call: [phone]

## 2016-08-02 NOTE — Brief Op Note (Signed)
BRIEF OPERATIVE NOTE    Date of Procedure: 08/02/2016   Preoperative Diagnosis: UNKNOWN  Postoperative Diagnosis: * No post-op diagnosis entered *    Procedure(s):  T5 LAMINECTOMY WITH POSTERIOR INSTRUMENTATION AND FUSION AT T3-7  Surgeon(s) and Role:     * Roel Cluck, MD - assist     * Okey Dupre, MD - primary         Assistant Staff:       Surgical Staff:  Circ-1: Helen Hashimoto, RN  Circ-Relief: Sherryl Manges, RN; Dorthey Sawyer, RN  Radiology Technician: Kinnie Feil, RT; Kizzie Furnish, RT, R  Scrub RN-1: Annia Friendly, RN  Scrub RN-Relief: Dorthey Sawyer, RN  Surg Asst-1: Trudee Grip  Event Time In   Incision Start 1318   Incision Close      Anesthesia: General   Estimated Blood Loss: 200  Specimens: * No specimens in log *   Findings: fx   Complications: no  Implants:   Implant Name Type Inv. Item Serial No. Manufacturer Lot No. LRB No. Used Action   GRAFT BNE SUB SD 1X10CM -- MAGNIFUSE - [id number]  GRAFT BNE SUB SD 1X10CM -- MAGNIFUSE [id number] MEDTRONIC SPINALGRAFT TECH NA N/A 1 Implanted   GRAFT BNE PROGENIX PLUS --  - SNA  GRAFT BNE PROGENIX PLUS --  NA MEDTRONIC SPINALGRAFT TECH [id number] N/A 1 Implanted   SCR SET SPNE BRK-OFF 5.5MM TI --  - SNA  SCR SET SPNE BRK-OFF 5.5MM TI --  NA MEDTRONIC SOFAMOR DANEK NA N/A 8 Implanted   SCR ROD 5.5 MAS 4.5X40MM CC -- CD HORIZON SOLERA - SNA  SCR ROD 5.5 MAS 4.5X40MM CC -- CD HORIZON SOLERA NA MEDTRONIC SOFAMOR DANEK NA N/A 4 Implanted   SCR ROD 5.5 MAS 5.0X40MM CC -- CD HORIZON SOLERA - SNA  SCR ROD 5.5 MAS 5.0X40MM CC -- CD HORIZON SOLERA NA MEDTRONIC SOFAMOR DANEK NA N/A 4 Implanted   ROD SPNE CP4 NS CRV 5.5X120MM -- CD HORIZON SOLERA - SNA  ROD SPNE CP4 NS CRV 5.5X120MM -- CD HORIZON SOLERA NA MEDTRONIC SOFAMOR DANEK NA N/A 2 Implanted   30mm - 34 crosslink     NA MEDTRONIC SOFAMOR DANEK NA N/A 1 Implanted

## 2016-08-02 NOTE — Progress Notes (Signed)
CM reviewed case with treatment team during Bedside Interdisciplinary Rounds. Plan for surgery today for T5 burst fracture and T6 fracture.    CM to continue to follow for discharge planning needs.

## 2016-08-02 NOTE — Op Note (Signed)
[facility]   [address]   [place], [place] [id number]   OP NOTE       Name:  Carl Howell, Carl Howell   MR#:  [id number]   DOB:  1976-06-22   Account #:  [id number]    Surgery Date:  08/02/2016   Date of Adm:  07/30/2016       PREOPERATIVE DIAGNOSIS: Unstable 3 column thoracic 5 spinal   fracture.    POSTOPERATIVE DIAGNOSIS: Unstable 3 column thoracic 5 spinal   fracture.    PROCEDURES PERFORMED: Thoracic 5 laminectomy,   decompression, posterolateral instrumented fusion, thoracic 3 through   thoracic 7 with posterior pedicle screws using the Medtronic Solera   system instrumentation and posterolateral allograft using Medtronic   MagniFuse and Progenix Plus.    SURGEON: Lorelle GibbsPeter A. Lyn HollingsheadAlexander, MD    ASSISTANT: Roel CluckKatrina G. Murphy, MD    ANESTHESIA: General endotracheal anesthesia.    ESTIMATED BLOOD LOSS: 200 mL.    COMPLICATIONS: None.    SPECIMENS REMOVED: None.    OPERATIVE INDICATIONS: A 40 year old gentleman with a history of   malignant brain tumor, had a seizure and fell with excruciating back   pain. CT revealed a 3 column end fracture of thoracic 5 with   compression of the vertebral body. After discussing treatment options   and risks of surgery, informed consent was obtained.    DESCRIPTION OF PROCEDURE: The patient was taken to the   operating room, placed under general endotracheal anesthesia. All   necessary lines and monitors were placed. He was given an   appropriate dose of IV antibiotics. SCDs and Foley were placed. He   was placed prone on chest rolls. All pressure points were padded,   arms tucked by the side. The upper and mid thoracic region were   prepped and draped in standard sterile fashion.   An incision was made from thoracic 3 to thoracic 7-8 with a skin knife,   carried down with Bovie electrocautery in the avascular midline plane.   Subperiosteal dissection was performed on the lamina of thoracic 3,   thoracic 4, thoracic 5, thoracic 6 and 7. There was a fracture through    the spinous process of T5. There was a little bit of fracture through the   lamina, some soft tissue hematoma. We used fluoroscopy to clearly   identify the fractured vertebral body. We exposed the transverse   processes of all the levels as noted above. Self-retaining retractor was   placed. I then placed pedicle screws on the right at T3, T4, T6 and T7   using 4.5 x 40 mm screws in T3 and T4 and then 5.0 x 40 mm screws   in T6 and T7. I then provisionally hooked this up to a 120 mm titanium   rod in order to hold the spine in place. I then performed a complete   laminectomy of T5, undercutting T5-T6 and T4-T5. The spinal cord did   not seem terribly compressed. There was no evidence of an epidural   hematoma. Wide decompression was performed of the spinal cord at   this level. I then cannulated the pedicles of T3-T4 and T6 and T7 on   the left-hand side. I then placed the same screws 4.5 x 40 mm at T3   and T4 and 5.0 x 40 mm screws at T6 and T7. All the screws appeared   to be in good position on AP fluoroscopy. Lateral fluoroscopy did not  help us very much. The right T4 appeared to be right on the lateral   edge of the vertebral body, but appeared to be in good position. The   posterolateral gutters and remaining lamina then were decorticated   with a high-speed drill. I placed 10 cm MagniFuse bilaterally along the   lamina as well as Progenix Plus bone graft allograft from T3-T7; 120   mm rods were then shaped and locked into place. A Crosslink was   placed. The wound was copiously irrigated with antibiotic solution and   a Hemovac drain was placed and brought out through a separate stab   incision. The retractors were removed. The wound was then closed   using 0 Vicryl to close the deep fascial layer, 2-0 Vicryl in the deep   dermal layer, running 4-0 Monocryl in a subcuticular fashion. Wounds   were cleaned, dried, dressed with sterile dressing. The patient was    then flipped over, extubated, taken to the recovery room in stable   condition.        Jabron Weese A. Lyn HollingsheadALEXANDER, MD      PA / [facility]   D:  08/02/2016   18:01   T:  08/02/2016   21:21   Job #:  [id number]

## 2016-08-02 NOTE — Anesthesia Pre-Procedure Evaluation (Signed)
Anesthetic History   No history of anesthetic complications            Review of Systems / Medical History  Patient summary reviewed, nursing notes reviewed and pertinent labs reviewed    Pulmonary  Within defined limits                 Neuro/Psych   Within defined limits           Cardiovascular  Within defined limits                     GI/Hepatic/Renal  Within defined limits              Endo/Other  Within defined limits           Other Findings              Physical Exam    Airway  Mallampati: II  TM Distance: > 6 cm  Neck ROM: normal range of motion   Mouth opening: Normal     Cardiovascular  Regular rate and rhythm,  S1 and S2 normal,  no murmur, click, rub, or gallop             Dental  No notable dental hx       Pulmonary  Breath sounds clear to auscultation               Abdominal  GI exam deferred       Other Findings            Anesthetic Plan    ASA: 2  Anesthesia type: general    Monitoring Plan: Arterial line      Induction: Intravenous  Anesthetic plan and risks discussed with: Patient

## 2016-08-02 NOTE — Anesthesia Post-Procedure Evaluation (Signed)
Post-Anesthesia Evaluation and Assessment    Patient: Carl Howell MRN: [id number]  SSN: [id number]    Date of Birth: [birth year]  Age: 40 y.o.  Sex: male       Cardiovascular Function/Vital Signs  Visit Vitals   ??? BP 143/82   ??? Pulse 92   ??? Temp 36.6 ??C (97.9 ??F)   ??? Resp 11   ??? Wt 63.1 kg (139 lb 1.8 oz)   ??? SpO2 97%   ??? BMI 19.4 kg/m2       Patient is status post general anesthesia for Procedure(s):  T5 LAMINECTOMY WITH POSTERIOR INSTRUMENTATION AND FUSION AT T3-8.    Nausea/Vomiting: None    Postoperative hydration reviewed and adequate.    Pain:  Pain Scale 1: Numeric (0 - 10) (08/02/16 1133)  Pain Intensity 1: 0 (08/02/16 1133)   Managed    Neurological Status:   Neuro  Neurologic State: Drowsy;Eyes open to voice;Sleeping (08/02/16 0800)  Orientation Level: Oriented X4 (08/02/16 0800)  Cognition: Follows commands;Impaired decision making (08/02/16 0800)  Speech: Delayed responses (08/02/16 0800)  Assessment L Pupil: Brisk;Round (08/02/16 0800)  Size L Pupil (mm): 2 (08/02/16 0800)  Assessment R Pupil: Brisk;Round (08/02/16 0800)  Size R Pupil (mm): 2 (08/02/16 0800)  LUE Motor Response: Purposeful;Spontaneous ;Weak (08/02/16 0800)  LLE Motor Response: Purposeful;Spontaneous ;Weak (08/02/16 0800)  RUE Motor Response: Purposeful;Spontaneous ;Weak (08/02/16 0800)  RLE Motor Response: Purposeful;Spontaneous ;Weak (08/02/16 0800)   At baseline    Mental Status and Level of Consciousness: Arousable    Pulmonary Status:   O2 Device: Room air (08/02/16 1636)   Adequate oxygenation and airway patent    Complications related to anesthesia: None    Post-anesthesia assessment completed. No concerns    Signed By: Teresita Madura., MD     August 02, 2016

## 2016-08-02 NOTE — Progress Notes (Incomplete)
Bedside and Verbal shift change report given to Statistician (oncoming nurse) by Foye Clock RN (offgoing nurse). Report included the following information Kardex, Intake/Output, MAR and Recent Results.   0800: VSS pt assessed will cont to monitor  1015: TRANSFER - OUT REPORT:    Verbal report given to OR HOLDING RN(name) on Carl Howell  being transferred to OR holding(unit) for routine post - op       Report consisted of patient???s Situation, Background, Assessment and   Recommendations(SBAR).     Information from the following report(s) Kardex, Intake/Output, MAR and Recent Results was reviewed with the receiving nurse.    Lines:   Peripheral IV 07/31/16 Right Antecubital (Active)   Site Assessment Clean, dry, & intact 08/02/2016  8:00 AM   Phlebitis Assessment 0 08/02/2016  8:00 AM   Infiltration Assessment 0 08/02/2016  8:00 AM   Dressing Status Clean, dry, & intact 08/02/2016  8:00 AM   Dressing Type Transparent;Tape 08/02/2016  8:00 AM   Hub Color/Line Status Blue;Infusing 08/02/2016  8:00 AM   Action Taken Open ports on tubing capped 08/02/2016  8:00 AM   Alcohol Cap Used Yes 08/02/2016  8:00 AM       Peripheral IV 08/01/16 Left Hand (Active)   Site Assessment Clean, dry, & intact 08/02/2016  8:00 AM   Phlebitis Assessment 0 08/02/2016  8:00 AM   Infiltration Assessment 0 08/02/2016  8:00 AM   Dressing Status Clean, dry, & intact 08/02/2016  8:00 AM   Dressing Type Transparent;Tape 08/02/2016  8:00 AM   Hub Color/Line Status Blue;Capped 08/02/2016  8:00 AM   Action Taken Open ports on tubing capped 08/02/2016  8:00 AM   Alcohol Cap Used Yes 08/02/2016  8:00 AM        Opportunity for questions and clarification was provided.      Patient transported with:   Monitor  Registered Nurse  Tech  1125: transported pt to OR

## 2016-08-02 NOTE — Anesthesia Procedure Notes (Signed)
Arterial Line Placement    Start time: 08/02/2016 12:01 PM  End time: 08/02/2016 12:30 PM  Performed by: FREDERICK JR, Konstantina Nachreiner A  Authorized by: FREDERICK JR, Merriam Brandner A     Pre-Procedure  Indications:  Arterial pressure monitoring  Preanesthetic Checklist: patient identified, risks and benefits discussed, anesthesia consent, site marked, patient being monitored, timeout performed and patient being monitored      Procedure:   Prep:  ChloraPrep  Seldinger Technique?: Yes    Orientation:  Left  Location:  Radial artery  Catheter size:  20 G  Number of attempts:  2  Cont Cardiac Output Sensor: No      Assessment:   Post-procedure:  Sterile dressing applied  Patient Tolerance:  Patient tolerated the procedure well with no immediate complications

## 2016-08-02 NOTE — Other (Addendum)
Dr. Lyn HollingsheadAlexander at bedside, aware of pt L sided weakness consistent with preop. Precidex drip held unless patient needs it. IV fluid orders received.     1740: Patient rating pain 5/10, states he is uncomfortable. Additional pain medication offered, patient adamantly  refusing stating he wants to get to his room.     1800: TRANSFER - OUT REPORT:    Verbal report given to Natalie RN(name) on Carl Howell  being transferred to ICU(unit) for routine post - op       Report consisted of patient???s Situation, Background, Assessment and   Recommendations(SBAR).     Time Pre op antibiotic given:1240  Anesthesia Stop time: 1635    Information from the following report(s) SBAR, OR Summary, Intake/Output, MAR, Recent Results and Med Rec Status was reviewed with the receiving nurse.    Opportunity for questions and clarification was provided.     Is the patient on 02? NO       L/Min        Other    Is the patient on a monitor? YES    Is the nurse transporting with the patient? YES    Surgical Waiting Area notified of patient's transfer from PACU? YES      The following personal items collected during your admission accompanied patient upon transfer:   Dental Appliance: Dental Appliances: None  Vision:    Hearing Aid:    Jewelry: Jewelry: None  Clothing: Clothing:  (patient from room)  Other Valuables: Other Valuables: None  Valuables sent to safe:

## 2016-08-02 NOTE — Progress Notes (Addendum)
2000 Bedside and Verbal shift change report given to Foye Clock, Charity fundraiser (oncoming nurse) by Selena Batten, RN (offgoing nurse). Report included the following information SBAR, Kardex, ED Summary, Procedure Summary, Intake/Output, MAR and Recent Results.     2023 Pt anxious, restless, attempting to get out of bed. Education reinforced regarding strict bedrest due to spinal fracture and risk for paralysis. Pt adamant that he would like to go home, sleep in his own bed, and come back for surgery in the morning. Teaching reinforced, Precedex gtt titrated for goal RASS -1.  2100 Pt resting comfortably.  2225 Pt's mother on phone, expressing concerns regarding having consented to the procedure. Mother states that she has discussed the surgical procedure with the pt's brother, sister, and father, who have all expressed concerns regarding risks of the procedure. RN assured pt's mother that surgeon will be available to answer questions regarding the procedure before surgery.  0400 Pt incontinent of urine x1 ~2200. Pt bladder scanned at this time for ~200cc urine in bladder. Will continue to monitor.    0800 Bedside and Verbal shift change report given to [employer], Charity fundraiser (Cabin crew) by Foye Clock, RN (offgoing nurse). Report included the following information SBAR, Kardex, ED Summary, Procedure Summary, Intake/Output, MAR and Recent Results.     Problem: Falls - Risk of  Goal: *Absence of Falls  Document Schmid Fall Risk and appropriate interventions in the flowsheet.   Outcome: Progressing Towards Goal  Fall Risk Interventions:  Mobility Interventions: Assess mobility with egress test, Communicate number of staff needed for ambulation/transfer, OT consult for ADLs, Patient to call before getting OOB, PT Consult for mobility concerns, PT Consult for assist device competence, Strengthening exercises (ROM-active/passive)     Mentation Interventions: Adequate sleep, hydration, pain control, Door  open when patient unattended, Evaluate medications/consider consulting pharmacy, More frequent rounding, Reorient patient, Room close to nurse's station, Toileting rounds, Update white board     Medication Interventions: Evaluate medications/consider consulting pharmacy, Patient to call before getting OOB, Teach patient to arise slowly     Elimination Interventions: Call light in reach, Patient to call for help with toileting needs, Toilet paper/wipes in reach, Toileting schedule/hourly rounds, Urinal in reach     History of Falls Interventions: Consult care management for discharge planning, Door open when patient unattended, Evaluate medications/consider consulting pharmacy, Investigate reason for fall, Room close to nurse's station           Problem: General Medical Care Plan  Goal: *Optimal pain control at patient???s stated goal  Outcome: Progressing Towards Goal  PRN Norco, Dilaudid  Goal: *Optimize nutritional status  Outcome: Not Progressing Towards Goal  Poor PO intake, NPO since 0000 for surgery today  Goal: *Anxiety reduced or absent  Outcome: Progressing Towards Goal  Increasing Precedex gtt overnight, awakens anxious, restless  Goal: *Progressive mobility and function (eg: ADL???s)  Outcome: Progressing Towards Goal  Strict bedrest due to spinal fracture, encouraging pt to log roll and not get up out of bed

## 2016-08-02 NOTE — Other (Signed)
Patient: Joesphine Baredonijah Bia MRN: [id number]  SSN: [id number]   Date of Birth: Nov 29, 1975  Age: 40 y.o.  Sex: male     Patient is status post Procedure(s):  T5 LAMINECTOMY WITH POSTERIOR INSTRUMENTATION AND FUSION AT T3-8.    Surgeon(s) and Role:     * Roel CluckKatrina G Murphy, MD - Primary     * Okey DuprePeter A Alexander, MD - Assisting    Local/Dose/Irrigation:  NO LOCAL                  Peripheral IV 07/31/16 Right Antecubital (Active)   Site Assessment Clean, dry, & intact 08/02/2016  8:00 AM   Phlebitis Assessment 0 08/02/2016  8:00 AM   Infiltration Assessment 0 08/02/2016  8:00 AM   Dressing Status Clean, dry, & intact 08/02/2016  8:00 AM   Dressing Type Transparent;Tape 08/02/2016  8:00 AM   Hub Color/Line Status Blue;Infusing 08/02/2016  8:00 AM   Action Taken Open ports on tubing capped 08/02/2016  8:00 AM   Alcohol Cap Used Yes 08/02/2016  8:00 AM       Peripheral IV 08/01/16 Left Hand (Active)   Site Assessment Clean, dry, & intact 08/02/2016  8:00 AM   Phlebitis Assessment 0 08/02/2016  8:00 AM   Infiltration Assessment 0 08/02/2016  8:00 AM   Dressing Status Clean, dry, & intact 08/02/2016  8:00 AM   Dressing Type Transparent;Tape 08/02/2016  8:00 AM   Hub Color/Line Status Blue;Capped 08/02/2016  8:00 AM   Action Taken Open ports on tubing capped 08/02/2016  8:00 AM   Alcohol Cap Used Yes 08/02/2016  8:00 AM       Peripheral IV 08/02/16 Left Arm (Active)       Peripheral IV 08/02/16 Right Arm (Active)      Arterial Line 08/02/16 Left Radial artery (Active)        Hemovac Right;Lower Back (Active)   Site Assessment Clean, dry, & intact 08/02/2016  3:46 PM   Dressing Status Clean, dry, & intact 08/02/2016  3:46 PM   Drainage Description Sanguinous 08/02/2016  3:46 PM   Status Patent;Charged;Draining 08/02/2016  3:46 PM      Airway - Endotracheal Tube 08/02/16 Oral (Active)                   Dressing/Packing:  Wound Back Mid;Upper-DRESSING TYPE: Adhesive wound closure strips (Steri-Strips);Other (Comment) ([place] [employer]ESS[employer]) (08/02/16 1545)       Other:  FOLEY, HEMOVAC RIGHT LOWER BACK

## 2016-08-02 NOTE — Progress Notes (Signed)
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 He was found to have a right SDH without subsequent seizures since admission with postictal Todd's paralysis on the left and ongoing prolonged postictal confusion, with possible contribution from SDH as well as pain medications, now improved. Additionally found to have T5 burst fracture, surgery scheduled for this afternoon. Exam with improved mental status, 5/5 grip on left, improved.    Plan:   1. Continue Vimpat 200mg  bid  2. Continue Keppra 1750mg  bid    Chart reviewed since last seen  Subjective:   Pt being wheeled to surgery for unstable T5 burst fracture and T6 fx.  No seizures.  Pt and family note that he is doing much better today.     Objective:     Current Facility-Administered Medications   Medication Dose Route Frequency   ??? lacosamide (VIMPAT) 200 mg in 0.9% sodium chloride IVPB  200 mg IntraVENous BID   ??? levETIRAcetam (KEPPRA) 1,750 mg in 0.9% sodium chloride IVPB  1,750 mg IntraVENous Q12H   ??? 0.9% sodium chloride infusion 250 mL  250 mL IntraVENous PRN   ??? LORazepam (ATIVAN) injection 1 mg  1 mg IntraVENous Q2H PRN   ??? nicotine (NICODERM CQ) 21 mg/24 hr patch 1 Patch  1 Patch TransDERmal DAILY   ??? HYDROmorphone (PF) (DILAUDID) injection 2 mg  2 mg IntraVENous Q3H PRN    ??? oxyCODONE-acetaminophen (PERCOCET 10) 10-325 mg per tablet 1 Tab  1 Tab Oral Q4H PRN   ??? sodium chloride (NS) flush 5-10 mL  5-10 mL IntraVENous Q8H   ??? sodium chloride (NS) flush 5-10 mL  5-10 mL IntraVENous PRN   ??? acetaminophen (TYLENOL) tablet 650 mg  650 mg Oral Q4H PRN   ??? ondansetron (ZOFRAN) injection 4 mg  4 mg IntraVENous Q4H PRN   ??? LORazepam (ATIVAN) tablet 1 mg  1 mg Oral Q4H PRN   ??? famotidine (PEPCID) tablet 20 mg  20 mg Oral BID   ??? sertraline (ZOLOFT) tablet 100 mg  100 mg Oral DAILY   ??? venlafaxine-SR (EFFEXOR-XR) capsule 150 mg  150 mg Oral DAILY WITH BREAKFAST   ??? cyclobenzaprine (FLEXERIL) tablet 5 mg  5 mg Oral TID   ??? dexmedeTOMidine (PRECEDEX) 400 mcg in 0.9% sodium chloride 100 mL infusion  0.2-1.4 mcg/kg/hr IntraVENous TITRATE       Visit Vitals   ??? BP 123/80   ??? Pulse 82   ??? Temp 97.5 ??F (36.4 ??C)   ??? Resp 17   ??? Wt 63.1 kg (139 lb 1.8 oz)   ??? SpO2 100%   ??? BMI 19.4 kg/m2     Temp (24hrs), Avg:97.8 ??F (36.6 ??C), Min:97 ??F (36.1 ??C), Max:98.5 ??F (36.9 ??C)      09/19 0701 - 09/19 1900  In: 13.3 [I.V.:13.3]  Out: -   09/17 1901 -  09/19 0700  In: 393 [I.V.:393]  Out: 1100 [Urine:1100]      Physical Exam:  General: Well developed well nourished patient in no apparent distress.     Neurological Exam:  Mental Status: Alert, calm, appropriate.  Oriented to person, hospital -[address] [place], [address] has difficulty with year, "1917, 1920, 2019."  Speech and language intact. Attention and fund of knowledge appropriate.     Cranial Nerves:   EOMI, no nystagmus, no ptosis. Facial movement is symmetric.   Hearing is intact.   Motor:  5/5 grip bilaterally   Reflexes:      Sensory:      Gait:  Non-ambulatory currently   Cerebellar:           Lab Review   Recent Results (from the past 24 hour(s))   CBC WITH AUTOMATED DIFF    Collection Time: 08/02/16  3:09 AM   Result Value Ref Range    WBC 7.1 4.1 - 11.1 K/uL    RBC 3.73 (L) 4.10 - 5.70 M/uL    HGB 11.6 (L) 12.1 - 17.0 g/dL    HCT 16.1 (L) 09.6 - 50.3 %     MCV 92.5 80.0 - 99.0 FL    MCH 31.1 26.0 - 34.0 PG    MCHC 33.6 30.0 - 36.5 g/dL    RDW 04.5 40.9 - 81.1 %    PLATELET 210 150 - 400 K/uL    NEUTROPHILS 63 32 - 75 %    LYMPHOCYTES 23 12 - 49 %    MONOCYTES 13 5 - 13 %    EOSINOPHILS 1 0 - 7 %    BASOPHILS 0 0 - 1 %    ABS. NEUTROPHILS 4.5 1.8 - 8.0 K/UL    ABS. LYMPHOCYTES 1.6 0.8 - 3.5 K/UL    ABS. MONOCYTES 0.9 0.0 - 1.0 K/UL    ABS. EOSINOPHILS 0.1 0.0 - 0.4 K/UL    ABS. BASOPHILS 0.0 0.0 - 0.1 K/UL   METABOLIC PANEL, BASIC    Collection Time: 08/02/16  3:09 AM   Result Value Ref Range    Sodium 134 (L) 136 - 145 mmol/L    Potassium 3.9 3.5 - 5.1 mmol/L    Chloride 101 97 - 108 mmol/L    CO2 23 21 - 32 mmol/L    Anion gap 10 5 - 15 mmol/L    Glucose 91 65 - 100 mg/dL    BUN 12 6 - 20 MG/DL    Creatinine 9.14 7.82 - 1.30 MG/DL    BUN/Creatinine ratio 17 12 - 20      GFR est AA >60 >60 ml/min/1.77m2    GFR est non-AA >60 >60 ml/min/1.44m2    Calcium 8.6 8.5 - 10.1 MG/DL   PROTHROMBIN TIME + INR    Collection Time: 08/02/16  3:09 AM   Result Value Ref Range    INR 1.0 0.9 - 1.1      Prothrombin time 10.3 9.0 - 11.1 sec   PTT    Collection Time: 08/02/16  3:09 AM   Result Value Ref Range    aPTT 28.6 22.1 - 32.5 sec    aPTT, therapeutic range     58.0 - 77.0 SECS   TYPE & SCREEN    Collection Time: 08/02/16  3:09 AM   Result Value Ref Range    Crossmatch Expiration 08/05/2016     ABO/Rh(D) A POSITIVE     Antibody screen NEG     Unit number [id number]  Blood component type RC LR AS1     Unit division 00     Status of unit ALLOCATED     Crossmatch result Compatible     Unit number [id number]     Blood component type RC LR AS1     Unit division 00     Status of unit ALLOCATED     Crossmatch result Compatible        Additional comments:  I have reviewed the patient's new clinical lab test results.  I have personally reviewed the patient's radiographs.  CT scan   CT Results (most recent):  Final result (Exam End: 07/31/2016 ??9:00 AM) Open    Study Result    HEAD CT WITHOUT CONTRAST: 07/31/2016 9:00 AM  ??  INDICATION: Sub-dural hemorrhage  ??  COMPARISON: 07/30/2016.  ??  PROCEDURE: Axial images of the head were obtained without contrast. Coronal and  sagittal reformats were performed. CT dose reduction was achieved through use of  a standardized protocol tailored for this examination and automatic exposure  control for dose modulation.  Adaptive statistical iterative reconstruction  (ASIR) was utilized.  ??  FINDINGS: There is chronic encephalomalacia in the right parietal and occipital  lobes post right parietal craniotomy. Small right parietal subdural hemorrhage  is stable. The axial obliquity of the scan mimics midline shift, however, there  is no significant right to left midline shift. No new hemorrhage. The ventricles  and sulci are appropriate in size and configuration for age. No new loss of  gray-white differentiation to suggest late acute or early subacute infarction.  An oval, circumscribed, superficial skin lesion superior to the right parotid  likely represents a sebaceous cyst.  ??  IMPRESSION  IMPRESSION: Stable small right parietal subdural hemorrhage.           Results from Hospital Encounter encounter on 07/30/16   CT HEAD WO CONT   Narrative EXAM:  CT HEAD WITHOUT CONTRAST  INDICATION: Subdural hematoma.  COMPARISON: 07/31/2016.  CONTRAST: None.    TECHNIQUE: Unenhanced CT of the head was performed using 5 mm images. Brain and  bone windows were generated. Sagittal and coronal reformations were generated.  CT dose reduction was achieved through use of a standardized protocol tailored  for this examination and automatic exposure control for dose modulation. CT dose  reduction was achieved through use of a standardized protocol tailored for this  examination and automatic exposure control for dose modulation. Adaptive  statistical iterative reconstruction (ASIR) was utilized for this examination.    FINDINGS:   There is a right parietal craniotomy. There is a small right parietal, left  parafalcine and left subdural hematoma which is unchanged.  There is  encephalomalacia in the right parietal lobe with some surrounding decreased  white matter attenuation. The ventricles are normal in size, shape and  configuration and midline.  There is no new intracranial hemorrhage.  There is  no extra-axial collection, mass, mass effect or midline shift.  The basilar  cisterns are open.  No acute infarct is identified. The bone windows demonstrate  no abnormalities.  The visualized portions of the paranasal sinuses and mastoid  air cells are clear.         Impression IMPRESSION: Right parietal craniotomy with right parietal encephalomalacia and  small amount of subdural hemorrhage as described above, unchanged.            Care Plan discussed with:  Patient x   Family x   RN x   Care  Manager    Consultant/Specialist:  x     Signed:Chauncey Reading, MD

## 2016-08-02 NOTE — Progress Notes (Addendum)
1910- Paged Dr. Lyn HollingsheadAlexander, patient states that pain has been untouched by morphine.  1920- Received telephone order from Dr. Lyn HollingsheadAlexander to increase dose to 2mg  q198min on morphine PCA and to change 4 hour lockout to 30mg .  1940- Patient continues to have RASS of +2 and states increase in morphine is not working, he wishes to go home. Patient educated on need to remain in hospital, patient agrees that pain would not be better controlled outside of hospital setting.   691951- Patient continues to c/o of pain 6/10, patient encouraged to use PCA, RASS +2.  2045- Dr. Lyn HollingsheadAlexander paged to advise patient still complaining of uncontrolled pain, no new orders. Also advised patient states he wants to leave and take an Benedetto Goad[employer]ber to a hotel.  2049-- Precedex increased RASS +2  2130- Dr. Lyn HollingsheadAlexander on unit, confirmed maintenance fluids, q4h neuro, and RASS to be kept 0 to -1.   2200- Patient drowsy, VSS, neuro exam stable. Patient agitated and continues to state that he wants to leave for a variety of reasons. Patient educated on why he needs to stay in the hospital.

## 2016-08-02 NOTE — Progress Notes (Signed)
Attended interdisciplinary rounds in ICU.   Chaplain: Rev. Rachel K. Cobb, MDiv; BCC; to contact Spirit[facility] Services call: [phone]

## 2016-08-02 NOTE — Op Note (Signed)
[facility]   [address]   [place], [place] [address]   OP NOTE       Name:  Carl Howell, Carl Howell   MR#:  [id number]   DOB:  [birth year]   Account #:  [id number]    Surgery Date:  08/02/2016   Date of Adm:  07/30/2016       PREOPERATIVE DIAGNOSIS: Unstable 3 column thoracic 5 spinal   fracture.    POSTOPERATIVE DIAGNOSIS: Unstable 3 column thoracic 5 spinal   fracture.    PROCEDURES PERFORMED: Thoracic 5 laminectomy,   decompression, posterolateral instrumented fusion, thoracic 3 through   thoracic 7 with posterior pedicle screws using the Medtronic Solera   system instrumentation and posterolateral allograft using Medtronic   MagniFuse and Progenix Plus.    SURGEON: Lorelle Gibbs. Lyn Hollingshead, Carl Howell    ASSISTANT: Roel Cluck, Carl Howell    ANESTHESIA: General endotracheal anesthesia.    ESTIMATED BLOOD LOSS: 200 mL.    COMPLICATIONS: None.    SPECIMENS REMOVED: None.    OPERATIVE INDICATIONS: A 40 year old gentleman with a history of   malignant brain tumor, had a seizure and fell with excruciating back   pain. CT revealed a 3 column end fracture of thoracic 5 with   compression of the vertebral body. After discussing treatment options   and risks of surgery, informed consent was obtained.    DESCRIPTION OF PROCEDURE: The patient was taken to the   operating room, placed under general endotracheal anesthesia. All   necessary lines and monitors were placed. He was given an   appropriate dose of IV antibiotics. SCDs and Foley were placed. He   was placed prone on chest rolls. All pressure points were padded,   arms tucked by the side. The upper and mid thoracic region were   prepped and draped in standard sterile fashion.   An incision was made from thoracic 3 to thoracic 7-8 with a skin knife,   carried down with Bovie electrocautery in the avascular midline plane.   Subperiosteal dissection was performed on the lamina of thoracic 3,   thoracic 4, thoracic 5, thoracic 6 and 7. There was a fracture through   the  spinous process of T5. There was a little bit of fracture through the   lamina, some soft tissue hematoma. We used fluoroscopy to clearly   identify the fractured vertebral body. We exposed the transverse   processes of all the levels as noted above. Self-retaining retractor was   placed. I then placed pedicle screws on the right at T3, T4, T6 and T7   using 4.5 x 40 mm screws in T3 and T4 and then 5.0 x 40 mm screws   in T6 and T7. I then provisionally hooked this up to a 120 mm titanium   rod in order to hold the spine in place. I then performed a complete   laminectomy of T5, undercutting T5-T6 and T4-T5. The spinal cord did   not seem terribly compressed. There was no evidence of an epidural   hematoma. Wide decompression was performed of the spinal cord at   this level. I then cannulated the pedicles of T3-T4 and T6 and T7 on   the left-hand side. I then placed the same screws 4.5 x 40 mm at T3   and T4 and 5.0 x 40 mm screws at T6 and T7. All the screws appeared   to be in good position on AP fluoroscopy. Lateral fluoroscopy did not  help us very much. The right T4 appeared to be right on the lateral   edge of the vertebral body, but appeared to be in good position. The   posterolateral gutters and remaining lamina then were decorticated   with a high-speed drill. I placed 10 cm MagniFuse bilaterally along the   lamina as well as Progenix Plus bone graft allograft from T3-T7; 120   mm rods were then shaped and locked into place. A Crosslink was   placed. The wound was copiously irrigated with antibiotic solution and   a Hemovac drain was placed and brought out through a separate stab   incision. The retractors were removed. The wound was then closed   using 0 Vicryl to close the deep fascial layer, 2-0 Vicryl in the deep   dermal layer, running 4-0 Monocryl in a subcuticular fashion. Wounds   were cleaned, dried, dressed with sterile dressing. The patient was   then flipped over, extubated, taken to the  recovery room in stable   condition.        Carl Cappiello A. Lyn HollingsheadALEXANDER, MD      PA / [facility]   D:  08/02/2016   18:01   T:  08/02/2016   21:21   Job #:  [id number]

## 2016-08-02 NOTE — Anesthesia Procedure Notes (Signed)
Arterial Line Placement    Start time: 08/02/2016 12:01 PM  End time: 08/02/2016 12:30 PM  Performed by: Cristy FolksFREDERICK JR, Sallye Lunz A  Authorized by: Cristy FolksFREDERICK JR, Leyda Vanderwerf A     Pre-Procedure  Indications:  Arterial pressure monitoring  Preanesthetic Checklist: patient identified, risks and benefits discussed, anesthesia consent, site marked, patient being monitored, timeout performed and patient being monitored      Procedure:   Prep:  ChloraPrep  Seldinger Technique?: Yes    Orientation:  Left  Location:  Radial artery  Catheter size:  20 G  Number of attempts:  2  Cont Cardiac Output Sensor: No      Assessment:   Post-procedure:  Sterile dressing applied  Patient Tolerance:  Patient tolerated the procedure well with no immediate complications

## 2016-08-03 ENCOUNTER — Inpatient Hospital Stay: Admit: 2016-08-03 | Payer: PRIVATE HEALTH INSURANCE | Primary: Family Medicine

## 2016-08-03 LAB — CBC W/O DIFF
HCT: 18.5 % — ABNORMAL LOW (ref 36.6–50.3)
HGB: 6 g/dL — ABNORMAL LOW (ref 12.1–17.0)
MCH: 30.6 PG (ref 26.0–34.0)
MCHC: 32.4 g/dL (ref 30.0–36.5)
MCV: 94.4 FL (ref 80.0–99.0)
PLATELET: 137 10*3/uL — ABNORMAL LOW (ref 150–400)
RBC: 1.96 M/uL — ABNORMAL LOW (ref 4.10–5.70)
RDW: 12.3 % (ref 11.5–14.5)
WBC: 5.5 10*3/uL (ref 4.1–11.1)

## 2016-08-03 LAB — CBC WITH AUTOMATED DIFF
ABS. BASOPHILS: 0 10*3/uL (ref 0.0–0.1)
ABS. EOSINOPHILS: 0.1 10*3/uL (ref 0.0–0.4)
ABS. LYMPHOCYTES: 1.3 10*3/uL (ref 0.8–3.5)
ABS. MONOCYTES: 0.8 10*3/uL (ref 0.0–1.0)
ABS. NEUTROPHILS: 5.5 10*3/uL (ref 1.8–8.0)
BASOPHILS: 0 % (ref 0–1)
EOSINOPHILS: 1 % (ref 0–7)
HCT: 32.3 % — ABNORMAL LOW (ref 36.6–50.3)
HGB: 10.6 g/dL — ABNORMAL LOW (ref 12.1–17.0)
LYMPHOCYTES: 17 % (ref 12–49)
MCH: 30.5 PG (ref 26.0–34.0)
MCHC: 32.8 g/dL (ref 30.0–36.5)
MCV: 92.8 FL (ref 80.0–99.0)
MONOCYTES: 11 % (ref 5–13)
NEUTROPHILS: 71 % (ref 32–75)
PLATELET: 275 10*3/uL (ref 150–400)
RBC: 3.48 M/uL — ABNORMAL LOW (ref 4.10–5.70)
RDW: 12.3 % (ref 11.5–14.5)
WBC: 7.7 10*3/uL (ref 4.1–11.1)

## 2016-08-03 LAB — METABOLIC PANEL, BASIC
Anion gap: 4 mmol/L — ABNORMAL LOW (ref 5–15)
Anion gap: 8 mmol/L (ref 5–15)
BUN/Creatinine ratio: 10 — ABNORMAL LOW (ref 12–20)
BUN/Creatinine ratio: 8 — ABNORMAL LOW (ref 12–20)
BUN: 5 MG/DL — ABNORMAL LOW (ref 6–20)
BUN: 6 MG/DL (ref 6–20)
CO2: 25 mmol/L (ref 21–32)
CO2: 26 mmol/L (ref 21–32)
Calcium: 7.2 MG/DL — ABNORMAL LOW (ref 8.5–10.1)
Calcium: 8.3 MG/DL — ABNORMAL LOW (ref 8.5–10.1)
Chloride: 113 mmol/L — ABNORMAL HIGH (ref 97–108)
Chloride: 103 mmol/L (ref 97–108)
Creatinine: 0.49 MG/DL — ABNORMAL LOW (ref 0.70–1.30)
Creatinine: 0.71 MG/DL (ref 0.70–1.30)
GFR est AA: >60 mL/min/{1.73_m2} (ref >60)
GFR est AA: >60 mL/min/{1.73_m2} (ref >60)
GFR est non-AA: >60 mL/min/{1.73_m2} (ref >60)
GFR est non-AA: >60 mL/min/{1.73_m2} (ref >60)
Glucose: 82 mg/dL (ref 65–100)
Glucose: 99 mg/dL (ref 65–100)
Potassium: 6.2 mmol/L — ABNORMAL HIGH (ref 3.5–5.1)
Potassium: 3.8 mmol/L (ref 3.5–5.1)
Sodium: 142 mmol/L (ref 136–145)
Sodium: 137 mmol/L (ref 136–145)

## 2016-08-03 LAB — TYPE & SCREEN
ABO/Rh(D): A POS
Antibody screen: NEGATIVE
Unit division: 0
Unit division: 0

## 2016-08-03 LAB — TYPE AND SCREEN
ABO/Rh: A POS
Antibody Screen: NEGATIVE
Unit Divison: 0
Unit Divison: 0

## 2016-08-03 MED ORDER — LORAZEPAM 2 MG/ML IJ SOLN
2 mg/mL | INTRAMUSCULAR | Status: DC | PRN
Start: 2016-08-03 — End: 2016-08-08
  Administered 2016-08-03 – 2016-08-05 (×7): via INTRAVENOUS

## 2016-08-03 MED ORDER — NS WITH POTASSIUM CHLORIDE 20 MEQ/L IV
20 mEq/L | INTRAVENOUS | Status: DC
Start: 2016-08-03 — End: 2016-08-04
  Administered 2016-08-03 – 2016-08-04 (×5): via INTRAVENOUS

## 2016-08-03 MED ORDER — TRAZODONE 50 MG TAB
50 mg | Freq: Every evening | ORAL | Status: DC
Start: 2016-08-03 — End: 2016-08-08
  Administered 2016-08-04 – 2016-08-08 (×5): via ORAL

## 2016-08-03 MED ORDER — LACOSAMIDE 50 MG TAB
50 mg | Freq: Two times a day (BID) | ORAL | Status: DC
Start: 2016-08-03 — End: 2016-08-08
  Administered 2016-08-04 – 2016-08-08 (×10): via ORAL

## 2016-08-03 MED ORDER — FENTANYL CITRATE (PF) 50 MCG/ML IJ SOLN
50 mcg/mL | INTRAMUSCULAR | Status: DC | PRN
Start: 2016-08-03 — End: 2016-08-08
  Administered 2016-08-03 – 2016-08-05 (×12): via INTRAVENOUS

## 2016-08-03 MED ORDER — HALOPERIDOL LACTATE 5 MG/ML IJ SOLN
5 mg/mL | Freq: Four times a day (QID) | INTRAMUSCULAR | Status: DC | PRN
Start: 2016-08-03 — End: 2016-08-08

## 2016-08-03 MED ORDER — LEVETIRACETAM 500 MG TAB
500 mg | Freq: Two times a day (BID) | ORAL | Status: DC
Start: 2016-08-03 — End: 2016-08-08
  Administered 2016-08-04 – 2016-08-08 (×10): via ORAL

## 2016-08-03 MED FILL — PRECEDEX 100 MCG/ML INTRAVENOUS SOLUTION: 100 mcg/mL | INTRAVENOUS | Qty: 4

## 2016-08-03 MED FILL — OXYCODONE-ACETAMINOPHEN 10 MG-325 MG TAB: 10-325 mg | ORAL | Qty: 1

## 2016-08-03 MED FILL — LORAZEPAM 2 MG/ML IJ SOLN: 2 mg/mL | INTRAMUSCULAR | Qty: 1

## 2016-08-03 MED FILL — NEOSTIGMINE METHYLSULFATE 5 MG/5 ML (1 MG/ML) IV SYRINGE: 5 mg/ mL (1 mg/mL) | INTRAVENOUS | Qty: 4

## 2016-08-03 MED FILL — HALOPERIDOL LACTATE 5 MG/ML IJ SOLN: 5 mg/mL | INTRAMUSCULAR | Qty: 1

## 2016-08-03 MED FILL — FAMOTIDINE 20 MG TAB: 20 mg | ORAL | Qty: 1

## 2016-08-03 MED FILL — VIMPAT 200 MG/20 ML INTRAVENOUS SOLUTION: 200 mg/20 mL | INTRAVENOUS | Qty: 20

## 2016-08-03 MED FILL — FENTANYL CITRATE (PF) 50 MCG/ML IJ SOLN: 50 mcg/mL | INTRAMUSCULAR | Qty: 2

## 2016-08-03 MED FILL — LEVETIRACETAM 500 MG/5 ML IV SOLN: 500 mg/5 mL | INTRAVENOUS | Qty: 10

## 2016-08-03 MED FILL — QUELICIN 20 MG/ML INJECTION SOLUTION: 20 mg/mL | INTRAMUSCULAR | Qty: 9

## 2016-08-03 MED FILL — DEXAMETHASONE SODIUM PHOSPHATE 10 MG/ML IJ SOLN: 10 mg/mL | INTRAMUSCULAR | Qty: 1

## 2016-08-03 MED FILL — LEVETIRACETAM 500 MG/5 ML IV SOLN: 500 mg/5 mL | INTRAVENOUS | Qty: 17.5

## 2016-08-03 MED FILL — NORMAL SALINE FLUSH 0.9 % INJECTION SYRINGE: INTRAMUSCULAR | Qty: 40

## 2016-08-03 MED FILL — TYLENOL 325 MG TABLET: 325 mg | ORAL | Qty: 2

## 2016-08-03 MED FILL — CEFAZOLIN 2 GRAM/50 ML NS IVPB: INTRAVENOUS | Qty: 50

## 2016-08-03 MED FILL — VENLAFAXINE SR 150 MG 24 HR CAP: 150 mg | ORAL | Qty: 1

## 2016-08-03 MED FILL — GLYCOPYRROLATE 0.2 MG/ML IJ SOLN: 0.2 mg/mL | INTRAMUSCULAR | Qty: 3

## 2016-08-03 MED FILL — NORMAL SALINE FLUSH 0.9 % INJECTION SYRINGE: INTRAMUSCULAR | Qty: 20

## 2016-08-03 MED FILL — SODIUM CHLORIDE 0.9 % IV: INTRAVENOUS | Qty: 1000

## 2016-08-03 MED FILL — ONDANSETRON (PF) 4 MG/2 ML INJECTION: 4 mg/2 mL | INTRAMUSCULAR | Qty: 2

## 2016-08-03 MED FILL — ROCURONIUM 10 MG/ML IV: 10 mg/mL | INTRAVENOUS | Qty: 5.5

## 2016-08-03 MED FILL — SERTRALINE 50 MG TAB: 50 mg | ORAL | Qty: 2

## 2016-08-03 MED FILL — LEVETIRACETAM 250 MG TAB: 250 mg | ORAL | Qty: 1

## 2016-08-03 MED FILL — NICOTINE 21 MG/24 HR DAILY PATCH: 21 mg/24 hr | TRANSDERMAL | Qty: 1

## 2016-08-03 MED FILL — PRECEDEX 100 MCG/ML INTRAVENOUS SOLUTION: 100 mcg/mL | INTRAVENOUS | Qty: 20

## 2016-08-03 MED FILL — DIPRIVAN 10 MG/ML INTRAVENOUS EMULSION: 10 mg/mL | INTRAVENOUS | Qty: 26

## 2016-08-03 MED FILL — NS WITH POTASSIUM CHLORIDE 20 MEQ/L IV: 20 mEq/L | INTRAVENOUS | Qty: 1000

## 2016-08-03 NOTE — Progress Notes (Signed)
Admission Medication Reconciliation:    Information obtained from: Patient, Rx Query , Rx bottles    Significant PMH/Disease States:   Past Medical History:   Diagnosis Date   ??? Cancer of brain (HCC)    ??? Epilepsy [facility](HCC) unknown       Chief Complaint for this Admission:  N/A    Allergies:  Review of patient's allergies indicates no known allergies.    Prior to Admission Medications:   Prior to Admission Medications   Prescriptions Last Dose Informant Patient Reported? Taking?   ACETAMINOPHEN/DIPHENHYDRAMINE (TYLENOL PM PO)   Yes Yes   Sig: Take 2 Tabs by mouth nightly as needed for Other (Sleep).   ASHWAGANDHA ROOT EXTRACT,BULK,   Yes Yes   Sig: Take 500 mg by mouth daily. Takes 2 capsules (250 mg each) daily   DANDELION PO   Yes Yes   Sig: Take 1,040 mg by mouth daily. Takes 2 capsules (520 mg each) daily   LORazepam (ATIVAN) 1 mg tablet   Yes Yes   Sig: Take 1 mg by mouth three (3) times daily as needed for Anxiety.   cholecalciferol (VITAMIN D3) 1,000 unit tablet   Yes Yes   Sig: Take 2,000 Units by mouth daily.   lacosamide (VIMPAT) 150 mg tab tablet   Yes Yes   Sig: Take 150 mg by mouth two (2) times a day.   levETIRAcetam (KEPPRA) 500 mg tablet   Yes Yes   Sig: Take 1,750 mg by mouth two (2) times a day.   venlafaxine-SR (EFFEXOR XR) 150 mg capsule   Yes Yes   Sig: Take 150 mg by mouth daily.      Facility-Administered Medications: None       Comments/Recommendations:     The patient's PTA medication list was reviewed with the patient and compared to both the Rx Query report and the Rx bottles brought in by his mother.  The following changes were made:    1. Ranitidine, ondansetron and sertraline were removed - these are not current medications  2. Cholecalciferol and acetaminophen/diphenhydramine were added  3. Doses of ashwagandha and dandelion were clarified and updated    Thank you for allowing me to participate in the care of this patient.  The  above list now represents the patient's most up-to-date PTA medication list.  Please call the inpatient pharmacy at [phone] with any questions.

## 2016-08-03 NOTE — Progress Notes (Addendum)
2330: Bedside shift change report given to Ciara, RN and Billey Goslingharlie, Charity fundraiserN (Cabin crewoncoming nurse) by Dorene GrebeNatalie, Museum/gallery conservatorN (offgoing nurse). Report included the following information SBAR, Kardex, ED Summary, OR Summary, Intake/Output, MAR, Recent Results, Med Rec Status, Cardiac Rhythm ST and Alarm Parameters .     2335: After receiving report, pt stated that he wanted to leave and go home. Attempted to educate patient on the severity of his surgery, and emphasized medication required to control his pain. RN offered patient pain and anxiety  medication which the patient refused.     2345: Paged hospitalist upon pt's request and persistence to leave the hospital.     0000: Charge nurse present at patient's bedside attempting to redirect. Pt became belligerant stating that, "he would sue the hospital, and the nursing care was horrible. Unable to redirect patient and explain the importance of his safety.   Pt proceeded to call 911.     0005: 911 dispatcher called the unit, stating that they received a call in which the pt stated that he was being held against his will. Per the 911 dispatcher, "pt sounded confused, wasn't sure if he was here for back or brain surgery." Dispatcher stated that the call wound be cancelled and that they wanted to make sure that the patient was safe.     0010: Notified nursing supervisor, to advise her of the situation and unwillingness of the patient's cooperation.     0015: Called security, advised them to be on standby.     0102: Paged hospitalist to come and see the patient. Patient still wants to leave and go home.     0110: Pt stated that "if the doctor did not arrive shortly, he would call the police for refusing his right to leave." Pt proceeded to call 911.     0115: Dr. Thad Rangereynolds, the hospitalist, at bedside. Pt agreed to stay until talking with the surgeon. Hospitalist gave verbal order to give Ativan, which the patient also requested.      0145: [place] police at pt bedside, police officer stated, " that the pt should remain calm and await the arrival of the surgeon to address issues." Pt agreed with the police officer and stated that he would not cause any further trouble.     0230: Pt resting comfortably.     0500: The documentation for this period is being entered following the guidelines as defined in the [facility] by Priscille Heidelbergiara P King, RN     [phone]: Pt is restless, belligerent, and attempting to leave without seeing the surgeon.    0700: Able to calm patient, insisting that the surgeon should be by shortly. Pt agreed to wait for surgeon.      0730: Bedside shift change report given to Nicholaus BloomKelley, RN (oncoming nurse) by Charlynne Cousinsiara, RN and Billey Goslingharlie, RN (offgoing nurse). Report included the following information SBAR, Kardex, ED Summary, OR Summary, Intake/Output, MAR, Recent Results, Med Rec Status, Cardiac Rhythm NSR/SB and Alarm Parameters .

## 2016-08-03 NOTE — Other (Addendum)
1930: Bedside and Verbal shift change report given to Dimas Aguas, RN (oncoming nurse) by Jeronimo Greaves, RN (offgoing nurse). Report included the following information SBAR, Kardex, OR Summary, Procedure Summary, Intake/Output, MAR, Recent Results, Med Rec Status and Cardiac Rhythm SR.     0730: Bedside and Verbal shift change report given to Jackalyn Lombard, RN (oncoming nurse) by Dimas Aguas, RN (offgoing nurse). Report included the following information SBAR, Kardex, OR Summary, Procedure Summary, Intake/Output, MAR, Recent Results, Med Rec Status and Cardiac Rhythm SR.

## 2016-08-03 NOTE — Progress Notes (Signed)
Hospitalist Progress Note  Carl Reeves, MD  Office: [phone]  Cell: [phone]      Date of Service:  08/03/2016  NAME:  Carl Howell  DOB:  Mar 17, 1976  MRN:  [id number]      Admission Summary:   40 yo male with PMhx of Rt Parietal occipital  Oligoastrocytoma s/p resection 2008, s/p chemo, seizure d/o, admitted for fall and witnessed seizures. Pt was transferred to [facility] for CT showing SDH.     Interval history / Subjective:   Pt seen and examined  Sleeping this AM   Overnight with agitation and wanting to leave AMA  Patient does not have understanding of severity of his problem and POD 1 after major spine surgery. He is not competent to make decision to leave AMA at this time.      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  - repeat CT this AM stable  - Neurosurgery following, no indication for surgery  - neuro check  - monitor BP    Epilepsy with Breakthrough Seizure   - Keppra 1750mg  BID  - Vimpat increased to 200mg  BID  - Neuro eval appreciated  - seizure precaution    Intractable Back Pain due to Unstable Burst Fx of T5 due to fall  - CT L and T Spine noted  - Neurosurgery on board,   - BEDREST  - Pain management  - s/p T5 laminectomy with post fusion at T3-T7  - pain control    Agitation  - patient not competent to make decision to leave AMA as he does not understand severity of his decision and is POD 1 after major spinal surgery. He is at risk of fall and recurrent injury, including paralysis. Will TDO patient if needed.     S/p Fall likely due to seizure  - PT/OT     Hx of Oligoastrocytoma   - s/p resection 2008 and chemo  - MRI brain to evaluate recurrence     Leukocytosis  - improving  - no signs of infection    Mild Hyponatremia  - monitor     Depression   -c/w Zoloft and Effexor    Tobacco abuse  - Nicotine patch     Code status: FULL  DVT prophylaxis: SCDs     Care Plan discussed with: Patient/Family and Nurse and Dr.Murphy  Disposition: Home w/Family     Hospital Problems  Date Reviewed: 08-01-16          Codes Class Noted POA    Closed unstable burst fracture of fifth thoracic vertebra (HCC) ICD-10-CM: H08.657Q  ICD-9-CM: 805.2  08/01/2016 Yes        Glioma (HCC) ICD-10-CM: C71.9  ICD-9-CM: 191.1  2016-08-01 Yes        Seizures (HCC) ICD-10-CM: R56.9  ICD-9-CM: 780.39  07/30/2016 Yes        * (Principal)Subdural hematoma (HCC) ICD-10-CM: I62.00  ICD-9-CM: 432.1  07/30/2016 Unknown        Epilepsy (HCC) ICD-10-CM: G40.909  ICD-9-CM: 345.90  Unknown Yes                Review of Systems:   A comprehensive review of systems was negative except for that written in the HPI.       Vital Signs:    Last 24hrs VS reviewed since prior progress note. Most recent are:  Visit Vitals   ??? BP 109/78   ??? Pulse 70   ???  Temp 97.7 ??F (36.5 ??C)   ??? Resp 14   ??? Wt 63.1 kg (139 lb 1.8 oz)   ??? SpO2 98%   ??? BMI 19.4 kg/m2         Intake/Output Summary (Last 24 hours) at 08/03/16 0917  Last data filed at 08/03/16 0700   Gross per 24 hour   Intake          3446.34 ml   Output             4465 ml   Net         -1018.66 ml        Physical Examination:             Constitutional:  sleepy and drowsy    HEENT: Bruise on forehead, Oral mucous moist, oropharynx benign. Neck supple,    Resp:  CTA bilaterally.    CV:  Regular rhythm, normal rate,     GI:  Soft, non distended, non tender. normoactive bowel sounds,      Musculoskeletal:  No edema, warm, 2+ pulses throughout    Neurologic:  alert and alert, old Left facial droop and weakness in UE/LE,      Back: dressing and drain in place        Data Review:    Review and/or order of clinical lab test  Review and/or order of tests in the radiology section of CPT  Review and/or order of tests in the medicine section of CPT      Labs:     Recent Labs      08/02/16   0309   WBC  7.1   HGB  11.6*   HCT  34.5*   PLT  210     Recent Labs      08/02/16    0309  08/01/16   0510   NA  134*  135*   K  3.9  3.6   CL  101  101   CO2  23  25   BUN  12  11   CREA  0.72  0.63*   GLU  91  96   CA  8.6  9.1     No results for input(s): SGOT, GPT, ALT, AP, TBIL, TBILI, TP, ALB, GLOB, GGT, AML, LPSE in the last 72 hours.    No lab exists for component: AMYP, HLPSE  Recent Labs      08/02/16   0309   INR  1.0   PTP  10.3   APTT  28.6      No results for input(s): FE, TIBC, PSAT, FERR in the last 72 hours.   No results found for: FOL, RBCF   No results for input(s): PH, PCO2, PO2 in the last 72 hours.  No results for input(s): CPK, CKNDX, TROIQ in the last 72 hours.    No lab exists for component: CPKMB  No results found for: CHOL, CHOLX, CHLST, CHOLV, HDL, LDL, LDLC, DLDLP, TGLX, TRIGL, TRIGP, CHHD, CHHDX  Lab Results   Component Value Date/Time    Glucose (POC) 146 07/30/2016 04:14 PM    Glucose (POC) 119 07/30/2016 10:13 AM    Glucose (POC) 88 12/06/2012 01:34 PM     Lab Results   Component Value Date/Time    Color YELLOW/STRAW 05/09/2014 02:48 PM    Appearance CLEAR 05/09/2014 02:48 PM    Specific gravity 1.020 05/09/2014 02:48 PM    pH (UA) 8.5 05/09/2014 02:48 PM  Protein 30 05/09/2014 02:48 PM    Glucose NEGATIVE  05/09/2014 02:48 PM    Ketone NEGATIVE  05/09/2014 02:48 PM    Bilirubin NEGATIVE  05/09/2014 02:48 PM    Urobilinogen 0.2 05/09/2014 02:48 PM    Nitrites NEGATIVE  05/09/2014 02:48 PM    Leukocyte Esterase NEGATIVE  05/09/2014 02:48 PM    Epithelial cells FEW 05/09/2014 02:48 PM    Bacteria NEGATIVE  05/09/2014 02:48 PM    WBC 0-4 05/09/2014 02:48 PM    RBC 0-5 05/09/2014 02:48 PM         Medications Reviewed:     Current Facility-Administered Medications   Medication Dose Route Frequency   ??? haloperidol lactate (HALDOL) injection 2 mg  2 mg IntraVENous Q6H PRN   ??? LORazepam (ATIVAN) injection 2 mg  2 mg IntraVENous Q1H PRN   ??? fentaNYL citrate (PF) injection 50-100 mcg  50-100 mcg IntraVENous Q1H PRN   ??? traZODone (DESYREL) tablet 50 mg  50 mg Oral QHS    ??? lacosamide (VIMPAT) 200 mg in 0.9% sodium chloride IVPB  200 mg IntraVENous BID   ??? levETIRAcetam (KEPPRA) 1,750 mg in 0.9% sodium chloride IVPB  1,750 mg IntraVENous Q12H   ??? sodium chloride (NS) flush 5-10 mL  5-10 mL IntraVENous Q8H   ??? sodium chloride (NS) flush 5-10 mL  5-10 mL IntraVENous PRN   ??? 0.9% sodium chloride infusion 250 mL  250 mL IntraVENous PRN   ??? ceFAZolin in 0.9% NS (ANCEF) IVPB soln 2 g  2 g IntraVENous Q8H   ??? morphine (PF) PCA 150 mg/30 ml   IntraVENous TITRATE   ??? 0.9% sodium chloride with KCl 20 mEq/L infusion   IntraVENous CONTINUOUS   ??? nicotine (NICODERM CQ) 21 mg/24 hr patch 1 Patch  1 Patch TransDERmal DAILY   ??? oxyCODONE-acetaminophen (PERCOCET 10) 10-325 mg per tablet 1 Tab  1 Tab Oral Q4H PRN   ??? sodium chloride (NS) flush 5-10 mL  5-10 mL IntraVENous Q8H   ??? sodium chloride (NS) flush 5-10 mL  5-10 mL IntraVENous PRN   ??? acetaminophen (TYLENOL) tablet 650 mg  650 mg Oral Q4H PRN   ??? ondansetron (ZOFRAN) injection 4 mg  4 mg IntraVENous Q4H PRN   ??? famotidine (PEPCID) tablet 20 mg  20 mg Oral BID   ??? sertraline (ZOLOFT) tablet 100 mg  100 mg Oral DAILY   ??? venlafaxine-SR (EFFEXOR-XR) capsule 150 mg  150 mg Oral DAILY WITH BREAKFAST   ??? dexmedeTOMidine (PRECEDEX) 400 mcg in 0.9% sodium chloride 100 mL infusion  0.2-1.4 mcg/kg/hr IntraVENous TITRATE     ______________________________________________________________________  EXPECTED LENGTH OF STAY: 3d 7h  ACTUAL LENGTH OF STAY:          4                 Carl Reevesharmesh R Rowene Suto, MD

## 2016-08-03 NOTE — Progress Notes (Signed)
Problem: Falls - Risk of  Goal: *Absence of Falls  Document Schmid Fall Risk and appropriate interventions in the flowsheet.   Outcome: Progressing Towards Goal  Fall Risk Interventions:  Mobility Interventions: Communicate number of staff needed for ambulation/transfer     Mentation Interventions: Evaluate medications/consider consulting pharmacy     Medication Interventions: Evaluate medications/consider consulting pharmacy     Elimination Interventions: Patient to call for help with toileting needs     History of Falls Interventions: Evaluate medications/consider consulting pharmacy, Door open when patient unattended

## 2016-08-03 NOTE — Progress Notes (Signed)
Intensivist    Pt s/p repair of unstable spine fracture.  He is having some agitation and wants to leave. On precedx and morphine drips. Has haldol available if needed.  To have PT eval today    Patient Vitals for the past 4 hrs:   BP Temp Pulse Resp SpO2   08/03/16 0800 109/78 97.7 ??F (36.5 ??C) 70 14 98 %   08/03/16 0700 107/74 - 61 17 99 %   08/03/16 0600 (!) 111/101 - 62 17 100 %   Temp (24hrs), Avg:97.7 ??F (36.5 ??C), Min:97.3 ??F (36.3 ??C), Max:97.9 ??F (36.6 ??C)    Intake/Output Summary (Last 24 hours) at 08/03/16 0912  Last data filed at 08/03/16 0700   Gross per 24 hour   Intake          3446.34 ml   Output             4465 ml   Net         -1018.66 ml     Alert  No distress  RRR    Lab:  Recent Labs      08/02/16   0309  08/01/16   0510   WBC  7.1   --    HGB  11.6*   --    PLT  210   --    NA  134*  135*   K  3.9  3.6   CL  101  101   CO2  23  25   BUN  12  11   CREA  0.72  0.63*   GLU  91  96   CA  8.6  9.1   INR  1.0   --      Hospital Problems  Date Reviewed: 07/31/2016          Codes Class Noted POA    Closed unstable burst fracture of fifth thoracic vertebra (HCC)  --08/02/2016  Procedure(s):  T5 LAMINECTOMY WITH POSTERIOR INSTRUMENTATION AND FUSION AT T3-8 ICD-10-CM: S22.052A  ICD-9-CM: 805.2  08/01/2016 Yes        * (Principal)Subdural hematoma (HCC) ICD-10-CM: I62.00  ICD-9-CM: 432.1  07/30/2016 Unknown        Seizures (HCC) ICD-10-CM: R56.9  ICD-9-CM: 780.39  07/30/2016 Yes        Epilepsy (HCC) ICD-10-CM: G40.909  ICD-9-CM: 345.90  Unknown Yes        Glioma (HCC) ICD-10-CM: C71.9  ICD-9-CM: 191.1  07/31/2016 Yes            --post op care per NS  --PT eval  --precedex /MSO4 drips/haldol prn  --has nicotine patch  --consider psych eval if agitation and insistence on leaving persists    Jhonnie GarnerShawn C Theressa Piedra, MD

## 2016-08-03 NOTE — Consults (Signed)
PSYCHIATRIC CONSULTATION                         IDENTIFICATION:    Patient Name  Carl Howell   Date of Birth 05-14-1976   CSN [id number]   Medical Record Number  [id number]      Age  40 y.o.   PCP Tommi Rumps, MD   Admit date:  07/30/2016    Room Number  7110/01  @ [facility] hospital   Date of Service  08/03/2016            HISTORY         REASON FOR HOSPITALIZATION/CONSULTATION:  Admitted following seizure and fall with subdural hematoma, now psychiatry asked to see secondary to agitated and angry statements re wanting to leave hospital  CC: "was just mad and they took it to far"    HISTORY OF PRESENT ILLNESS:    Pt with recovery from brain cancer and states doing well.  Lives with mother and two younger children.  States he is managing things well looking after his children despite not being able to work because of med/physical condition.  Lack of resources bothers him and knowledge of illness going on is distressful.  Pt states he was frustrated earlier in the day and then became obstreperous because of "it never ends."    Pt was opiate + on admission and will follow up with pt on substance and psychiatric topics.     ALLERGIES:  No Known Allergies   MEDICATIONS PRIOR TO ADMISSION:  Prescriptions Prior to Admission   Medication Sig   ??? venlafaxine-SR (EFFEXOR XR) 150 mg capsule Take 150 mg by mouth daily.   ??? ACETAMINOPHEN/DIPHENHYDRAMINE (TYLENOL PM PO) Take 2 Tabs by mouth nightly as needed for Other (Sleep).   ??? cholecalciferol (VITAMIN D3) 1,000 unit tablet Take 2,000 Units by mouth daily.   ??? levETIRAcetam (KEPPRA) 500 mg tablet Take 1,750 mg by mouth two (2) times a day.   ??? lacosamide (VIMPAT) 150 mg tab tablet Take 150 mg by mouth two (2) times a day.   ??? LORazepam (ATIVAN) 1 mg tablet Take 1 mg by mouth three (3) times daily as needed for Anxiety.   ??? DANDELION PO Take 1,040 mg by mouth daily. Takes 2 capsules (520 mg each) daily    ??? ASHWAGANDHA ROOT EXTRACT,BULK, Take 500 mg by mouth daily. Takes 2 capsules (250 mg each) daily      PAST MEDICAL HISTORY:  Active Ambulatory Problems     Diagnosis Date Noted   ??? Epilepsy (HCC)    ??? Seizures (HCC) 07/30/2016     Resolved Ambulatory Problems     Diagnosis Date Noted   ??? No Resolved Ambulatory Problems     Past Medical History:   Diagnosis Date   ??? Cancer of brain (HCC)    ??? Epilepsy (HCC) unknown      Past Medical History:   Diagnosis Date   ??? Cancer of brain (HCC)    ??? Epilepsy (HCC) unknown     Past Surgical History:   Procedure Laterality Date   ??? BRAIN AVM SURG,DURAL,COMPLX      brain surgery - unknown had brain tumor   ??? KIDNEY DIALYSATE DELIVRY SYS      kidney surgery - unknown   ??? NEUROLOGICAL PROCEDURE UNLISTED      craniotomy      SOCIAL HISTORY: lives with 2 children, mother.  Difficulty getting around  Social  History     Social History Narrative     Social History   Substance Use Topics   ??? Smoking status: Former Smoker     Packs/day: 0.25     Types: Cigarettes   ??? Smokeless tobacco: Never Used   ??? Alcohol use No      FAMILY HISTORY: History reviewed. No pertinent family history.  History reviewed. No pertinent family history.    REVIEW of SYSTEMS:   *Generalized pain but mostly in back where he a fusion related to fall and his misgivings.**             MENTAL STATUS EXAM & VITALS       MENTAL STATUS EXAM:    FINDINGS WITHIN NORMAL LIMITS (WNL) UNLESS OTHERWISE STATED BELOW:    Orientation Alert and oriented x 4.   Vital Signs (BP,Pulse, Temp) See below (reviewed 08/03/2016)   Gait and Station nnot observedWithin normal limits   Abnormal Muscular Movements/Tone/Behavior No EPS, no Tardive Dyskinesia, no abnormal muscular movements; wnl tone   Relations Appears friendly, has measured words, not always reliiable.   General Appearance:  Grooming and hygiene ok   Language No aphasia or dysarthria   Speech:  Normal rate, rhythm, volume, not pressured    Thought Processes logical, wnl rate of thoughts, good abstract reasoning and computation   Thought Associations Goal directed   Thought Content No psychotic sx   Suicidal Ideations None    Homicidal Ideations none   Mood:  euthymic   Affect:  Normal range   Memory recent  adequate   Memory remote:  As above   Concentration/Attention:  fair   Fund of Knowledge Fair/average   Insight:  {Psych insight & judgement fair   Reliability fair   Judgment:  fair          VITALS:     Patient Vitals for the past 24 hrs:   Temp Pulse Resp BP SpO2   08/03/16 2300 - (!) 108 20 110/81 -   08/03/16 2200 - (!) 111 18 (!) 107/93 96 %   08/03/16 2100 - 98 19 - 96 %   08/03/16 2000 97.7 ??F (36.5 ??C) 84 18 93/59 96 %   08/03/16 1900 - 90 16 110/65 97 %   08/03/16 1800 - 91 16 96/56 97 %   08/03/16 1700 - 85 (!) 31 (!) 112/91 99 %   08/03/16 1600 97.7 ??F (36.5 ??C) 94 18 111/68 98 %   08/03/16 1500 - 91 - - -   08/03/16 1400 - (!) 101 14 (!) 106/92 97 %   08/03/16 1200 - 90 17 120/90 100 %   08/03/16 1100 - 72 15 100/66 98 %   08/03/16 1001 - 71 15 94/61 98 %   08/03/16 0900 - 63 16 114/68 100 %   08/03/16 0800 97.7 ??F (36.5 ??C) 70 14 109/78 98 %   08/03/16 0700 - 61 17 107/74 99 %   08/03/16 0600 - 62 17 (!) 111/101 100 %   08/03/16 0500 - 61 16 111/71 100 %   08/03/16 0400 97.6 ??F (36.4 ??C) (!) 59 12 110/76 98 %   08/03/16 0300 - 71 18 (!) 88/56 99 %   08/03/16 0200 - 66 15 93/61 98 %   08/03/16 0100 - 83 15 (!) 88/44 97 %   08/03/16 0030 - 87 20 - 97 %   08/03/16 0000 97.7 ??F (36.5 ??C) 95 16 128/90 98 %  08/02/16 2330 - 90 12 - 98 %   08/02/16 2315 - 100 23 - 99 %              DATA     LABORATORY DATA:  Labs Reviewed   CBC WITH AUTOMATED DIFF - Abnormal; Notable for the following:        Result Value    WBC 14.5 (*)     RBC 3.99 (*)     HCT 36.3 (*)     NEUTROPHILS 85 (*)     LYMPHOCYTES 8 (*)     ABS. NEUTROPHILS 12.2 (*)     ABS. MONOCYTES 1.1 (*)     All other components within normal limits    METABOLIC PANEL, BASIC - Abnormal; Notable for the following:     Sodium 135 (*)     Glucose 103 (*)     BUN/Creatinine ratio 11 (*)     All other components within normal limits   METABOLIC PANEL, BASIC - Abnormal; Notable for the following:     Sodium 135 (*)     Creatinine 0.63 (*)     All other components within normal limits   CBC WITH AUTOMATED DIFF - Abnormal; Notable for the following:     RBC 3.73 (*)     HGB 11.6 (*)     HCT 34.5 (*)     All other components within normal limits   METABOLIC PANEL, BASIC - Abnormal; Notable for the following:     Sodium 134 (*)     All other components within normal limits   CBC W/O DIFF - Abnormal; Notable for the following:     RBC 1.96 (*)     HGB 6.0 (*)     HCT 18.5 (*)     PLATELET 137 (*)     All other components within normal limits   METABOLIC PANEL, BASIC - Abnormal; Notable for the following:     Potassium 6.2 (*)     Chloride 113 (*)     Anion gap 4 (*)     BUN 5 (*)     Creatinine 0.49 (*)     BUN/Creatinine ratio 10 (*)     Calcium 7.2 (*)     All other components within normal limits   CBC WITH AUTOMATED DIFF - Abnormal; Notable for the following:     RBC 3.48 (*)     HGB 10.6 (*)     HCT 32.3 (*)     All other components within normal limits   METABOLIC PANEL, BASIC - Abnormal; Notable for the following:     BUN/Creatinine ratio 8 (*)     Calcium 8.3 (*)     All other components within normal limits   MAGNESIUM   PHOSPHORUS   PROTHROMBIN TIME + INR   PTT   TYPE & SCREEN     Admission on 07/30/2016   Component Date Value Ref Range Status   ??? WBC 07/31/2016 14.5* 4.1 - 11.1 K/uL Final   ??? RBC 07/31/2016 3.99* 4.10 - 5.70 M/uL Final   ??? HGB 07/31/2016 12.4  12.1 - 17.0 g/dL Final   ??? HCT 16/08/9603 36.3* 36.6 - 50.3 % Final   ??? MCV 07/31/2016 91.0  80.0 - 99.0 FL Final   ??? MCH 07/31/2016 31.1  26.0 - 34.0 PG Final   ??? MCHC 07/31/2016 34.2  30.0 - 36.5 g/dL Final   ??? RDW 54/07/8118 12.4  11.5 - 14.5 % Final   ???  PLATELET 07/31/2016 277  150 - 400 K/uL Final    ??? NEUTROPHILS 07/31/2016 85* 32 - 75 % Final   ??? LYMPHOCYTES 07/31/2016 8* 12 - 49 % Final   ??? MONOCYTES 07/31/2016 7  5 - 13 % Final   ??? EOSINOPHILS 07/31/2016 0  0 - 7 % Final   ??? BASOPHILS 07/31/2016 0  0 - 1 % Final   ??? ABS. NEUTROPHILS 07/31/2016 12.2* 1.8 - 8.0 K/UL Final   ??? ABS. LYMPHOCYTES 07/31/2016 1.2  0.8 - 3.5 K/UL Final   ??? ABS. MONOCYTES 07/31/2016 1.1* 0.0 - 1.0 K/UL Final   ??? ABS. EOSINOPHILS 07/31/2016 0.0  0.0 - 0.4 K/UL Final   ??? ABS. BASOPHILS 07/31/2016 0.0  0.0 - 0.1 K/UL Final   ??? Magnesium 07/31/2016 2.0  1.6 - 2.4 mg/dL Final   ??? Sodium 16/10/960409/17/2017 135* 136 - 145 mmol/L Final   ??? Potassium 07/31/2016 3.8  3.5 - 5.1 mmol/L Final   ??? Chloride 07/31/2016 102  97 - 108 mmol/L Final   ??? CO2 07/31/2016 22  21 - 32 mmol/L Final   ??? Anion gap 07/31/2016 11  5 - 15 mmol/L Final   ??? Glucose 07/31/2016 103* 65 - 100 mg/dL Final   ??? BUN 54/09/811909/17/2017 9  6 - 20 MG/DL Final   ??? Creatinine 07/31/2016 0.82  0.70 - 1.30 MG/DL Final   ??? BUN/Creatinine ratio 07/31/2016 11* 12 - 20   Final   ??? GFR est AA 07/31/2016 >60  >60 ml/min/1.3873m2 Final   ??? GFR est non-AA 07/31/2016 >60  >60 ml/min/1.9273m2 Final   ??? Calcium 07/31/2016 9.1  8.5 - 10.1 MG/DL Final   ??? Phosphorus 07/31/2016 2.7  2.6 - 4.7 MG/DL Final   ??? Sodium 14/78/295609/18/2017 135* 136 - 145 mmol/L Final   ??? Potassium 08/01/2016 3.6  3.5 - 5.1 mmol/L Final   ??? Chloride 08/01/2016 101  97 - 108 mmol/L Final   ??? CO2 08/01/2016 25  21 - 32 mmol/L Final   ??? Anion gap 08/01/2016 9  5 - 15 mmol/L Final   ??? Glucose 08/01/2016 96  65 - 100 mg/dL Final   ??? BUN 21/30/865709/18/2017 11  6 - 20 MG/DL Final   ??? Creatinine 08/01/2016 0.63* 0.70 - 1.30 MG/DL Final   ??? BUN/Creatinine ratio 08/01/2016 17  12 - 20   Final   ??? GFR est AA 08/01/2016 >60  >60 ml/min/1.2073m2 Final   ??? GFR est non-AA 08/01/2016 >60  >60 ml/min/1.7873m2 Final   ??? Calcium 08/01/2016 9.1  8.5 - 10.1 MG/DL Final   ??? WBC 84/69/629509/19/2017 7.1  4.1 - 11.1 K/uL Final   ??? RBC 08/02/2016 3.73* 4.10 - 5.70 M/uL Final    ??? HGB 08/02/2016 11.6* 12.1 - 17.0 g/dL Final   ??? HCT 28/41/324409/19/2017 34.5* 36.6 - 50.3 % Final   ??? MCV 08/02/2016 92.5  80.0 - 99.0 FL Final   ??? MCH 08/02/2016 31.1  26.0 - 34.0 PG Final   ??? MCHC 08/02/2016 33.6  30.0 - 36.5 g/dL Final   ??? RDW 01/02/725309/19/2017 12.5  11.5 - 14.5 % Final   ??? PLATELET 08/02/2016 210  150 - 400 K/uL Final   ??? NEUTROPHILS 08/02/2016 63  32 - 75 % Final   ??? LYMPHOCYTES 08/02/2016 23  12 - 49 % Final   ??? MONOCYTES 08/02/2016 13  5 - 13 % Final   ??? EOSINOPHILS 08/02/2016 1  0 - 7 % Final   ???  BASOPHILS 08/02/2016 0  0 - 1 % Final   ??? ABS. NEUTROPHILS 08/02/2016 4.5  1.8 - 8.0 K/UL Final   ??? ABS. LYMPHOCYTES 08/02/2016 1.6  0.8 - 3.5 K/UL Final   ??? ABS. MONOCYTES 08/02/2016 0.9  0.0 - 1.0 K/UL Final   ??? ABS. EOSINOPHILS 08/02/2016 0.1  0.0 - 0.4 K/UL Final   ??? ABS. BASOPHILS 08/02/2016 0.0  0.0 - 0.1 K/UL Final   ??? Sodium 08/02/2016 134* 136 - 145 mmol/L Final   ??? Potassium 08/02/2016 3.9  3.5 - 5.1 mmol/L Final   ??? Chloride 08/02/2016 101  97 - 108 mmol/L Final   ??? CO2 08/02/2016 23  21 - 32 mmol/L Final   ??? Anion gap 08/02/2016 10  5 - 15 mmol/L Final   ??? Glucose 08/02/2016 91  65 - 100 mg/dL Final   ??? BUN 16/08/9603 12  6 - 20 MG/DL Final   ??? Creatinine 08/02/2016 0.72  0.70 - 1.30 MG/DL Final   ??? BUN/Creatinine ratio 08/02/2016 17  12 - 20   Final   ??? GFR est AA 08/02/2016 >60  >60 ml/min/1.70m2 Final   ??? GFR est non-AA 08/02/2016 >60  >60 ml/min/1.45m2 Final   ??? Calcium 08/02/2016 8.6  8.5 - 10.1 MG/DL Final   ??? INR 54/07/8118 1.0  0.9 - 1.1   Final   ??? Prothrombin time 08/02/2016 10.3  9.0 - 11.1 sec Final   ??? aPTT 08/02/2016 28.6  22.1 - 32.5 sec Final   ??? aPTT, therapeutic range 08/02/2016      58.0 - 77.0 SECS Final   ??? Crossmatch Expiration 08/02/2016 08/05/2016   Final   ??? ABO/Rh(D) 08/02/2016 A POSITIVE   Final   ??? Antibody screen 08/02/2016 NEG   Final   ??? Unit number 08/02/2016 [id number]   Final   ??? Blood component type 08/02/2016 RC LR AS1   Final    ??? Unit division 08/02/2016 00   Final   ??? Status of unit 08/02/2016 REL FROM [facility]   Final   ??? Crossmatch result 08/02/2016 Compatible   Final   ??? Unit number 08/02/2016 [id number]   Final   ??? Blood component type 08/02/2016 RC LR AS1   Final   ??? Unit division 08/02/2016 00   Final   ??? Status of unit 08/02/2016 REL FROM [facility]   Final   ??? Crossmatch result 08/02/2016 Compatible   Final   ??? WBC 08/03/2016 5.5  4.1 - 11.1 K/uL Final   ??? RBC 08/03/2016 1.96* 4.10 - 5.70 M/uL Final   ??? HGB 08/03/2016 6.0* 12.1 - 17.0 g/dL Final   ??? HCT 41/32/4401 18.5* 36.6 - 50.3 % Final   ??? MCV 08/03/2016 94.4  80.0 - 99.0 FL Final   ??? MCH 08/03/2016 30.6  26.0 - 34.0 PG Final   ??? MCHC 08/03/2016 32.4  30.0 - 36.5 g/dL Final   ??? RDW 02/72/5366 12.3  11.5 - 14.5 % Final   ??? PLATELET 08/03/2016 137* 150 - 400 K/uL Final   ??? Sodium 08/03/2016 142  136 - 145 mmol/L Final   ??? Potassium 08/03/2016 6.2* 3.5 - 5.1 mmol/L Final   ??? Chloride 08/03/2016 113* 97 - 108 mmol/L Final   ??? CO2 08/03/2016 25  21 - 32 mmol/L Final   ??? Anion gap 08/03/2016 4* 5 - 15 mmol/L Final   ??? Glucose 08/03/2016 82  65 - 100 mg/dL Final   ??? BUN 44/01/4741 5* 6 -  20 MG/DL Final   ??? Creatinine 08/03/2016 0.49* 0.70 - 1.30 MG/DL Final   ??? BUN/Creatinine ratio 08/03/2016 10* 12 - 20   Final   ??? GFR est AA 08/03/2016 >60  >60 ml/min/1.41m2 Final   ??? GFR est non-AA 08/03/2016 >60  >60 ml/min/1.27m2 Final   ??? Calcium 08/03/2016 7.2* 8.5 - 10.1 MG/DL Final   ??? WBC 16/08/9603 7.7  4.1 - 11.1 K/uL Final   ??? RBC 08/03/2016 3.48* 4.10 - 5.70 M/uL Final   ??? HGB 08/03/2016 10.6* 12.1 - 17.0 g/dL Final   ??? HCT 54/07/8118 32.3* 36.6 - 50.3 % Final   ??? MCV 08/03/2016 92.8  80.0 - 99.0 FL Final   ??? MCH 08/03/2016 30.5  26.0 - 34.0 PG Final   ??? MCHC 08/03/2016 32.8  30.0 - 36.5 g/dL Final   ??? RDW 14/78/2956 12.3  11.5 - 14.5 % Final   ??? PLATELET 08/03/2016 275  150 - 400 K/uL Final   ??? NEUTROPHILS 08/03/2016 71  32 - 75 % Final   ??? LYMPHOCYTES 08/03/2016 17  12 - 49 % Final    ??? MONOCYTES 08/03/2016 11  5 - 13 % Final   ??? EOSINOPHILS 08/03/2016 1  0 - 7 % Final   ??? BASOPHILS 08/03/2016 0  0 - 1 % Final   ??? ABS. NEUTROPHILS 08/03/2016 5.5  1.8 - 8.0 K/UL Final   ??? ABS. LYMPHOCYTES 08/03/2016 1.3  0.8 - 3.5 K/UL Final   ??? ABS. MONOCYTES 08/03/2016 0.8  0.0 - 1.0 K/UL Final   ??? ABS. EOSINOPHILS 08/03/2016 0.1  0.0 - 0.4 K/UL Final   ??? ABS. BASOPHILS 08/03/2016 0.0  0.0 - 0.1 K/UL Final   ??? Sodium 08/03/2016 137  136 - 145 mmol/L Final   ??? Potassium 08/03/2016 3.8  3.5 - 5.1 mmol/L Final   ??? Chloride 08/03/2016 103  97 - 108 mmol/L Final   ??? CO2 08/03/2016 26  21 - 32 mmol/L Final   ??? Anion gap 08/03/2016 8  5 - 15 mmol/L Final   ??? Glucose 08/03/2016 99  65 - 100 mg/dL Final   ??? BUN 21/30/8657 6  6 - 20 MG/DL Final   ??? Creatinine 08/03/2016 0.71  0.70 - 1.30 MG/DL Final   ??? BUN/Creatinine ratio 08/03/2016 8* 12 - 20   Final   ??? GFR est AA 08/03/2016 >60  >60 ml/min/1.25m2 Final   ??? GFR est non-AA 08/03/2016 >60  >60 ml/min/1.75m2 Final   ??? Calcium 08/03/2016 8.3* 8.5 - 10.1 MG/DL Final        RADIOLOGY REPORTS:    Results from Hospital Encounter encounter on 07/30/16   XR SPINE THORAC SNGL V   Narrative EXAM:  XR SPINE THORAC SNGL V   INDICATION: Intra-Op.  COMPARISON: None.    FINDINGS: A single digital image of the midthoracic spine demonstrates  retractors in the soft tissues. There are rods and screws from T4 to T8. The  patient is intubated and there is a temperature probe in the pharynx.         Impression IMPRESSION: Thoracic spine fusion in progress.    INTERPRETATION PROVIDED FOR COMPLIANCE ONLY AT NO CHARGE      Xr Chest Sngl V    Result Date: 07/31/2016  PORTABLE CHEST RADIOGRAPH/S: 07/31/2016 1:56 PM INDICATION: Vomiting, aspiration pneumonia, new infiltrate. COMPARISON: None. TECHNIQUE: Portable frontal supine radiograph/s of the chest. FINDINGS: The lungs are clear. The central airways are patent. No large pneumothorax  or pleural  effusion within the limitation of supine technique.     IMPRESSION: Clear lungs.    Xr Spine Thorac Sngl V    Result Date: 08/02/2016  EXAM:  XR SPINE THORAC SNGL V INDICATION: Intra-Op. COMPARISON: None. FINDINGS: A single digital image of the midthoracic spine demonstrates retractors in the soft tissues. There are rods and screws from T4 to T8. The patient is intubated and there is a temperature probe in the pharynx.     IMPRESSION: Thoracic spine fusion in progress. INTERPRETATION PROVIDED FOR COMPLIANCE ONLY AT NO CHARGE     Xr Spine Thorac 3 V    Result Date: 07/31/2016  THORACIC SPINE, 3 VIEWS. 07/31/2016 1:56 PM INDICATION: back pain after fall. COMPARISON: None. FINDINGS: Frontal, lateral, and swimmer's radiographs of the thoracic spine. Loss of height in an upper thoracic vertebral body is consistent with an age-indeterminate compression fracture. The cervicothoracic junction is obscured on both lateral radiographs. Thoracic alignment and vertebral body heights are otherwise normal.     IMPRESSION: Age-indeterminate upper thoracic compression fracture. The findings were called to Dr. Penelope Galas on 07/31/2016 2:07 PM by Dr. Reed Breech. On discussion with Dr. Denita Lung, this is acute, as the patient fell 8 feet and has severe back pain. Consider thoracolumbar MRI for evaluation of occult fractures. 789     Xr Spine Lumb 2 Or 3 V    Result Date: 07/31/2016  LUMBAR SPINE, 2 VIEWS. 07/31/2016 1:56 PM INDICATION: Back pain after 8 foot fall COMPARISON: None. FINDINGS: Frontal and lateral lumbar radiographs. Lumbar alignment and vertebral body heights are normal. The lateral radiograph is motion limited.     IMPRESSION: Normal alignment and vertebral body heights.    Xr Fluoroscopy Over 60 Minutes    Result Date: 08/02/2016  Fluoroscopy time was provided.     IMPRESSION:  Fluoroscopy time was provided. Fluoro dose:  30.62 mGy vk    Ct Head Wo Cont    Result Date: 08/02/2016   EXAM:  CT HEAD WITHOUT CONTRAST INDICATION: Subdural hematoma. COMPARISON: 07/31/2016. CONTRAST: None. TECHNIQUE: Unenhanced CT of the head was performed using 5 mm images. Brain and bone windows were generated. Sagittal and coronal reformations were generated. CT dose reduction was achieved through use of a standardized protocol tailored for this examination and automatic exposure control for dose modulation. CT dose reduction was achieved through use of a standardized protocol tailored for this examination and automatic exposure control for dose modulation. Adaptive statistical iterative reconstruction (ASIR) was utilized for this examination. FINDINGS: There is a right parietal craniotomy. There is a small right parietal, left parafalcine and left subdural hematoma which is unchanged.  There is encephalomalacia in the right parietal lobe with some surrounding decreased white matter attenuation. The ventricles are normal in size, shape and configuration and midline.  There is no new intracranial hemorrhage.  There is no extra-axial collection, mass, mass effect or midline shift.  The basilar cisterns are open.  No acute infarct is identified. The bone windows demonstrate no abnormalities.  The visualized portions of the paranasal sinuses and mastoid air cells are clear.     IMPRESSION: Right parietal craniotomy with right parietal encephalomalacia and small amount of subdural hemorrhage as described above, unchanged.      Ct Head Wo Cont    Result Date: 07/31/2016  HEAD CT WITHOUT CONTRAST: 07/31/2016 9:00 AM INDICATION: Sub-dural hemorrhage COMPARISON: 07/30/2016. PROCEDURE: Axial images of the head were obtained without contrast. Coronal and sagittal reformats were performed. CT dose  reduction was achieved through use of a standardized protocol tailored for this examination and automatic exposure control for dose modulation.  Adaptive statistical iterative reconstruction (ASIR) was  utilized. FINDINGS: There is chronic encephalomalacia in the right parietal and occipital lobes post right parietal craniotomy. Small right parietal subdural hemorrhage is stable. The axial obliquity of the scan mimics midline shift, however, there is no significant right to left midline shift. No new hemorrhage. The ventricles and sulci are appropriate in size and configuration for age. No new loss of gray-white differentiation to suggest late acute or early subacute infarction. An oval, circumscribed, superficial skin lesion superior to the right parotid likely represents a sebaceous cyst.     IMPRESSION: Stable small right parietal subdural hemorrhage.    Ct Head Wo Cont    Result Date: 07/30/2016  EXAM:  CT HEAD WO CONT INDICATION:   Epilepsy; f/u subdural COMPARISON: None. CONTRAST:  None. TECHNIQUE: Unenhanced CT of the head was performed using 5 mm images. Brain and bone windows were generated.  CT dose reduction was achieved through use of a standardized protocol tailored for this examination and automatic exposure control for dose modulation.  FINDINGS: A right subdural acute hematoma is again demonstrated measuring up to 5 mm in thickness. However, there is mildly increased underlying mass effect with effacement of the right lateral ventricle and new leftward midline shift of 3 mm. The basal cisterns are patent. There is no new abnormal parenchymal attenuation. A prior right posterior craniotomy is again demonstrated with underlying encephalomalacia. The right posterior subchondral soft tissue hematoma is again demonstrated.     IMPRESSION: Stable thickness of an acute right subdural hematoma. However, there is mildly increased underlying mass effect, effacement of the right lateral ventricle, and new leftward midline shift of 3 mm.     Ct Head Wo Cont    Result Date: 07/30/2016  EXAM:  CT head without contrast INDICATION: Fall down steps. Possible  seizure. COMPARISON: MR brain 06/12/2009. TECHNIQUE: Noncontrast head CT. Coronal and sagittal reformats. CT dose reduction was achieved through use of a standardized protocol tailored for this examination and automatic exposure control for dose modulation. FINDINGS: Surgical changes are again seen following a right posterior craniotomy with underlying encephalomalacia. There is an acute right subdural hematoma measuring up to 5 mm in thickness. There is no significant underlying midline shift or mass effect. The ventricles are stable in size and configuration without hydrocephalus. The basal cisterns are patent. There is a extracalvarial posterior soft tissue hematoma. The visualized paranasal sinuses and mastoid air cells are clear.     IMPRESSION: Acute right subdural hematoma measuring up to 5 mm in thickness. No significant midline shift or mass effect. No acute hydrocephalus. The findings were discussed with Dr. Modesto Charon on 07/30/2016 at 1145 hours by Dr. Jaye Beagle. 789     Ct Spine Cerv Wo Cont    Result Date: 07/30/2016  EXAM:  CT CERVICAL SPINE WITHOUT CONTRAST INDICATION: Fall down stairs. Possible seizure. COMPARISON: None. TECHNIQUE: Helical CT of the cervical spine was performed without contrast. Sagittal and coronal reconstructions were obtained. CT dose reduction was achieved through use of a standardized protocol tailored for this examination and automatic exposure control for dose modulation. CONTRAST: None. FINDINGS: Alignment is within normal limits. There is no fracture or subluxation. Odontoid process is intact. The craniocervical junction is within normal limits. Prevertebral soft tissues are within normal limits. There is no spinal canal or foraminal stenosis.     IMPRESSION: There is no  acute fracture or subluxation.     Ct Spine Thorac Wo Cont    Result Date: 08/03/2016  EXAM:  CT SPINE [facility] WO CONT INDICATION: Unstable burst fracture at T5.  Evaluate following fusion.. COMPARISON: 07/31/2016. TECHNIQUE: Multislice helical CT of the thoracic spine was performed. Sagittal and coronal reformations were generated. CT dose reduction was achieved through use of a standardized protocol tailored for this examination and automatic exposure control for dose modulation. FINDINGS: There is been posterior fusion from T3 through T7. There is only minimal retrolisthesis of posterior body of L5 due to the fracture. Alignment is otherwise anatomic. There are some hardware artifact but no definite spinal stenosis. The T5 comminuted fracture and subtle T6 superior endplate fracture are again noted.     IMPRESSION: Anatomic alignment following thoracic fusion from T3 through T7.    Ct Spine Thorac Wo Cont    Result Date: 07/31/2016  CT OF THE THORACIC AND LUMBAR SPINE WITHOUT CONTRAST: 07/31/2016 5:43 PM INDICATION: Intractable back pain after fall from 8 feet. Thoracic compression fracture on radiography. TECHNIQUE: Axial CT images were obtained of the thoracic and lumbar spine without contrast. Coronal and sagittal reconstructions were performed. CT dose reduction was achieved through use of a standardized protocol tailored for this examination and automatic exposure control for dose modulation. Adaptive statistical iterative reconstruction (ASIR) was utilized. COMPARISON: Same day thoracolumbar radiographs. FINDINGS: A heavily comminuted burst fracture of the T5 vertebral body fracture extends through the anterior, middle, and posterior columns, with a fracture of the spinous process. This is an unstable fracture. Retropulsion causes mild canal stenosis and mild right neural foraminal stenosis. Mild paraspinous hemorrhage and a left proximal rib fracture are associated. There is a more subtle fracture of T6 (2-207, 2-214), with trace paraspinous hemorrhage on image 3-51. There is trace paraspinous hemorrhage on the right at T7 as well (3-59) without a clear  fracture. Ossicles of the tips of the T3-T7 spinous processes mimic fractures but are normal variants. Failure of fusion of the posterior elements of T12 and L1 are also normal variants. Mild dependent atelectasis and minimal pleural fluid. Motion limited. No displaced fracture. Lumbar alignment is normal. Vertebral body heights are normal. Incidental note is made of scattered hepatic cysts. L4-S1 facet osteoarthritis probably causes neural foraminal stenosis, though motion limits evaluation.     IMPRESSION: 1. Heavily comminuted T5 burst fracture involving all 3 columns is unstable. Surgical consultation recommended. Retropulsion causes mild canal and mild right neural foraminal stenosis. 2. Subtle, nondisplaced T6 fracture. Trace paraspinous hemorrhage adjacent T7 without visible fracture. 3. No displaced lumbar fracture; lumbar evaluation limited by motion. The findings were called to Dr. Penelope Galas on 07/31/2016 6:04 PM by Dr. Reed Breech.  [phone]     Ct Spine Lumb Wo Cont    Result Date: 07/31/2016  CT OF THE THORACIC AND LUMBAR SPINE WITHOUT CONTRAST: 07/31/2016 5:43 PM INDICATION: Intractable back pain after fall from 8 feet. Thoracic compression fracture on radiography. TECHNIQUE: Axial CT images were obtained of the thoracic and lumbar spine without contrast. Coronal and sagittal reconstructions were performed. CT dose reduction was achieved through use of a standardized protocol tailored for this examination and automatic exposure control for dose modulation. Adaptive statistical iterative reconstruction (ASIR) was utilized. COMPARISON: Same day thoracolumbar radiographs. FINDINGS: A heavily comminuted burst fracture of the T5 vertebral body fracture extends through the anterior, middle, and posterior columns, with a fracture of the spinous process. This is an unstable fracture. Retropulsion causes mild canal  stenosis and mild right  neural foraminal stenosis. Mild paraspinous hemorrhage and a left proximal rib fracture are associated. There is a more subtle fracture of T6 (2-207, 2-214), with trace paraspinous hemorrhage on image 3-51. There is trace paraspinous hemorrhage on the right at T7 as well (3-59) without a clear fracture. Ossicles of the tips of the T3-T7 spinous processes mimic fractures but are normal variants. Failure of fusion of the posterior elements of T12 and L1 are also normal variants. Mild dependent atelectasis and minimal pleural fluid. Motion limited. No displaced fracture. Lumbar alignment is normal. Vertebral body heights are normal. Incidental note is made of scattered hepatic cysts. L4-S1 facet osteoarthritis probably causes neural foraminal stenosis, though motion limits evaluation.     IMPRESSION: 1. Heavily comminuted T5 burst fracture involving all 3 columns is unstable. Surgical consultation recommended. Retropulsion causes mild canal and mild right neural foraminal stenosis. 2. Subtle, nondisplaced T6 fracture. Trace paraspinous hemorrhage adjacent T7 without visible fracture. 3. No displaced lumbar fracture; lumbar evaluation limited by motion. The findings were called to Dr. Penelope Galas on 07/31/2016 6:04 PM by Dr. Reed Breech.  [phone]              MEDICATIONS       ALL MEDICATIONS  Current Facility-Administered Medications   Medication Dose Route Frequency   ??? haloperidol lactate (HALDOL) injection 2 mg  2 mg IntraVENous Q6H PRN   ??? LORazepam (ATIVAN) injection 2 mg  2 mg IntraVENous Q1H PRN   ??? fentaNYL citrate (PF) injection 50-100 mcg  50-100 mcg IntraVENous Q1H PRN   ??? traZODone (DESYREL) tablet 50 mg  50 mg Oral QHS   ??? levETIRAcetam (KEPPRA) tablet 1,750 mg  1,750 mg Oral Q12H   ??? lacosamide (VIMPAT) tablet 200 mg  200 mg Oral Q12H   ??? sodium chloride (NS) flush 5-10 mL  5-10 mL IntraVENous Q8H   ??? sodium chloride (NS) flush 5-10 mL  5-10 mL IntraVENous PRN    ??? 0.9% sodium chloride infusion 250 mL  250 mL IntraVENous PRN   ??? morphine (PF) PCA 150 mg/30 ml   IntraVENous TITRATE   ??? 0.9% sodium chloride with KCl 20 mEq/L infusion   IntraVENous CONTINUOUS   ??? nicotine (NICODERM CQ) 21 mg/24 hr patch 1 Patch  1 Patch TransDERmal DAILY   ??? oxyCODONE-acetaminophen (PERCOCET 10) 10-325 mg per tablet 1 Tab  1 Tab Oral Q4H PRN   ??? sodium chloride (NS) flush 5-10 mL  5-10 mL IntraVENous Q8H   ??? sodium chloride (NS) flush 5-10 mL  5-10 mL IntraVENous PRN   ??? acetaminophen (TYLENOL) tablet 650 mg  650 mg Oral Q4H PRN   ??? ondansetron (ZOFRAN) injection 4 mg  4 mg IntraVENous Q4H PRN   ??? famotidine (PEPCID) tablet 20 mg  20 mg Oral BID   ??? venlafaxine-SR (EFFEXOR-XR) capsule 150 mg  150 mg Oral DAILY WITH BREAKFAST   ??? dexmedeTOMidine (PRECEDEX) 400 mcg in 0.9% sodium chloride 100 mL infusion  0.2-1.4 mcg/kg/hr IntraVENous TITRATE      SCHEDULED MEDICATIONS  Current Facility-Administered Medications   Medication Dose Route Frequency   ??? traZODone (DESYREL) tablet 50 mg  50 mg Oral QHS   ??? levETIRAcetam (KEPPRA) tablet 1,750 mg  1,750 mg Oral Q12H   ??? lacosamide (VIMPAT) tablet 200 mg  200 mg Oral Q12H   ??? sodium chloride (NS) flush 5-10 mL  5-10 mL IntraVENous Q8H   ??? morphine (PF) PCA 150 mg/30 ml  IntraVENous TITRATE   ??? 0.9% sodium chloride with KCl 20 mEq/L infusion   IntraVENous CONTINUOUS   ??? nicotine (NICODERM CQ) 21 mg/24 hr patch 1 Patch  1 Patch TransDERmal DAILY   ??? sodium chloride (NS) flush 5-10 mL  5-10 mL IntraVENous Q8H   ??? famotidine (PEPCID) tablet 20 mg  20 mg Oral BID   ??? venlafaxine-SR (EFFEXOR-XR) capsule 150 mg  150 mg Oral DAILY WITH BREAKFAST   ??? dexmedeTOMidine (PRECEDEX) 400 mcg in 0.9% sodium chloride 100 mL infusion  0.2-1.4 mcg/kg/hr IntraVENous TITRATE                ASSESSMENT & PLAN        The patient Aidden Markovic is a 40 y.o.  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   Closed unstable burst fracture of fifth thoracic vertebra (HCC) (08/01/2016)    Assessment: per surgical team    Plan: as above  Possible polysubstance abuse.    Pt taking lots of PCA pump hits.  Continued on strong pace re using for pain.  No doubt in distress, likely exaggerated though especially with DUA + for cocaine and THC.  Would be judicious in opioid treatment and how much he takes here.  No psychotic or cognitive distortions.  Likely wanted to be home.  May angle for his [place] of pain medications.  Pt not a risk to self and others.  Did have interactive manner in mind but pt states not ready  Dx: Possible substance dependence.         Will try to explore more.                A coordinated, multidisplinary treatment team round was conducted with the patient; that includes the nurse, unit pharmcist, Administrator all present.     The following regarding medications was addressed during rounds with patient:   the risks and benefits of the proposed medication. The patient was given the opportunity to ask questions. Informed consent given to the use of the above medications.     I will continue to adjust psychiatric and non-psychiatric medications (see above "medication" section and orders section for details) as deemed appropriate & based upon diagnoses and response to treatment.     I have reviewed admission (and previous/old) labs and medical tests in the EHR and or transferring hospital documents. I will continue to order blood tests/labs and diagnostic tests as deemed appropriate and review results as they become available (see orders for details).     I have reviewed old psychiatric and medical records available in the EHR. I Will order additional psychiatric records from other institutions to further elucidate the nature of patient's psychopathology and review once available.    I will gather additional collateral information from friends, family and o/p treatment team to further elucidate the nature of patient's psychopathology and baselline level of psychiatric functioning.  SIGNED:    Billey Co, MD  08/03/2016

## 2016-08-03 NOTE — Progress Notes (Signed)
Problem: Mobility Impaired (Adult and Pediatric)  Goal: *Acute Goals and Plan of Care (Insert Text)  Physical Therapy Goals  Initiated 08/03/2016  1. Patient will move from supine to sit and sit to supine and scoot up and down in bed with supervision/set-up within 4 day(s).   2. Patient will transfer from bed to chair and chair to bed with supervision/set-up using the least restrictive device within 4 day(s).  3. Patient will perform sit to stand with supervision/set-up within 4 day(s).  4. Patient will ambulate with minimal assistance/contact guard assist for 150 feet with the least restrictive device within 4 day(s).   5. Patient will ascend/descend 3 stairs with no handrail(s) with minimal assistance/contact guard assist within 4 day(s).  6. Patient will improve Berg Balance score by 7 points within 7 days.   PHYSICAL THERAPY TREATMENT  Patient: Carl Howell (87(40 y.o. male)  Date: 08/03/2016  Diagnosis: subdural bleed  Subdural hematoma (HCC)  UNKNOWN Subdural hematoma (HCC)  Procedure(s) (LRB):  T5 LAMINECTOMY WITH POSTERIOR INSTRUMENTATION AND FUSION AT T3-8 (N/A) 1 Day Post-Op  Precautions: Back      ASSESSMENT:  Cleared for mobilization by RN, received up in chair - appears more alert this PM. He continues to demo poor overall insight to his functional deficits and level of assist needed however did appear receptive to rehab recommendation with education and attempts at reasoning (states he needs to get home to help his mom take care of his children - however then acknowledges that he needs to be able to first take care of himself). Gait training trialed with RW - mild improvements in overall stability however ?L sided inattention noted (cues to take bigger steps on L, avoid L sided obstacles, and keep L LE within RW frame) - progressed to approx 7490ft total. Pt requesting to remain up in chair at end of session. Will continue to progress as tolerated.      Progression toward goals:   [X]     Improving appropriately and progressing toward goals  [ ]     Improving slowly and progressing toward goals  [ ]     Not making progress toward goals and plan of care will be adjusted       PLAN:  Patient continues to benefit from skilled intervention to address the above impairments.  Continue treatment per established plan of care.  Discharge Recommendations:  Inpatient Rehab  Further Equipment Recommendations for Discharge:  To be determined       SUBJECTIVE:   Patient stated ???I'd be more than happy to go for a walk with you.???      OBJECTIVE DATA SUMMARY:   Critical Behavior:  Neurologic State: Drowsy, Eyes open to voice  Orientation Level: Oriented X4  Cognition: Follows commands, Poor safety awareness, Decreased attention/concentration  Safety/Judgement: Decreased awareness of environment, Decreased awareness of need for assistance, Decreased awareness of need for safety, Decreased insight into deficits  Functional Mobility Training:  Bed Mobility:     Supine to Sit: Moderate assistance     Transfers:  Sit to Stand: Minimum assistance  Stand to Sit: Minimum assistance     Balance:  Sitting: Intact;With support  Standing: Impaired;With support  Standing - Static: Fair;Constant support  Standing - Dynamic : Poor  Ambulation/Gait Training:  Distance (ft): 90 Feet (ft)  Assistive Device: Gait belt;Walker, rolling  Ambulation - Level of Assistance: Moderate assistance  Gait Description (WDL): Exceptions to WDL  Gait Abnormalities: Ataxic;Decreased step clearance;Trunk sway increased  Base of Support: Narrowed;Shift  to right  Speed/Cadence: Slow;Shuffled  Step Length: Left shortened                    Pain:  Pain Scale 1: Numeric (0 - 10)  Pain Intensity 1: 8  Pain Location 1: Back  Pain Orientation 1: Posterior  Pain Description 1: Aching  Pain Intervention(s) 1: Medication (see MAR);Encouraged PCA (PRN fentanyl & percocet)  Activity Tolerance:   VSS   Please refer to the flowsheet for vital signs taken during this treatment.  After treatment:   [X]     Patient left in no apparent distress sitting up in chair  [ ]     Patient left in no apparent distress in bed  [X]     Call bell left within reach  [X]     Nursing notified  [ ]     Caregiver present  [ ]     Bed alarm activated      COMMUNICATION/COLLABORATION:   The patient???s plan of care was discussed with: Registered Nurse     Andree CossJaclyn E Michels, PT, DPT   Time Calculation: 20 mins

## 2016-08-03 NOTE — Progress Notes (Signed)
Neurosurgery Progress Note  Carl Howell, [place]  [phone]        Admit Date: 07/30/2016   LOS: 4 days        Daily Progress Note: 08/03/2016    HPI: The patient fell down the stairs while having a seizure. He has a history of epilepsy after a resection of a oligoastrocytoma in 2008 at [facility] with a second resection 3 years ago. He is on multiple AEDs. His head CT in the ER revealed a subdural hematoma. The patient does not remember falling down the stairs. He began complaining of terrible back pain over the weekend. An x-ray revealed an age-indeterminate compression fracture. He was unable to lie flat for an MRI, so a thoracic CT scan was obtained, which revealed an unstable T5 burst fracture. The patient has been sedated on Precedex as patient was climbing out of the bed and the patient trying to get up and leave. When asked if he has numbness and tingling or pain going down his extremities he denies it. 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 He was confused and couldn't tell the dispatcher if he was there for brain or back surgery. He is maxed out on Precedex. He keeps saying he needs to leave and go home. He does not understand the severity of his fracture and the consequences if he does not follow spinal precautions. He is on a Morphine PCA. He has left sided weakness at baseline from his previous brain surgeries. He tells me he has a cane, but does not use it.      Objective:     Vital signs  Temp (24hrs), Avg:97.7 ??F (36.5 ??C), Min:97.3 ??F (36.3 ??C), Max:97.9 ??F (36.6 ??C)      09/18 1901 - 09/20 0700  In: 3662.1 [I.V.:3662.1]  Out: 4465 [Urine:3925; Drains:340]    Visit Vitals   ??? BP 109/78   ??? Pulse 70   ??? Temp 97.7 ??F (36.5 ??C)   ??? Resp 14   ??? Wt 63.1 kg (139 lb 1.8 oz)   ??? SpO2 98%    ??? BMI 19.4 kg/m2      O2 Device: Room air     Pain control  Pain Assessment  Pain Scale 1: Numeric (0 - 10)  Pain Intensity 1: 4  Pain Onset 1: post-op  Pain Location 1: Back  Pain Orientation 1: Posterior  Pain Description 1: Aching  Pain Intervention(s) 1: Medication (see MAR), Encouraged PCA    PT/OT  Gait                 Physical Exam:  Gen:NAD. Precedex 1.2 mcg/kg/hr infusing. Not turned off for exam.  Neuro: Awake. Ox3. Not making sense in terms of higher level thinking. Follows commands. Speech clear. Affect anxious.  PERRL. EOMI.   Strength RUE/RLE 5/5, LUE can move hand but otherwise weak. LLE 4/5.  Gait deferred.    HVAC 90 cc then 250 cc over night of serosanguineous drainage    24 hour results:    No results found for this or any previous visit (from the past 24 hour(s)).       Assessment:     Principal Problem:    Subdural hematoma (HCC) (07/30/2016)    Active Problems:  Epilepsy (HCC) ()      Seizures (HCC) (07/30/2016)      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 Subdural hematoma, traumatic right parietal   - CT scan stable   - Will need repeat head CT in 1-2 weeks to eval  2. Unstable T5 burst fracture   - s/p T3-T7 fusion and T5 laminectomy   - ok to get OOB. Does not need a brace   - Precedex to help with agitation   - Pain control - Morphine PCA, Fentanyl IV, Percocet PRN, Ativan PRN  3. Epilepsy   - Neurology following   - Vimpat dose increased. Keppra dose from home continued  4. Hx grade III oligoastrocytoma   - CT scan stable  5. Tobacco abuse   - Nicotine patch  6. Depression   - Cont effexor and zoloft from home   - Hospitalist following  7. Agitation   - Add Trazodone at bedtime   - Ativan, Fentanyl, Precedex, Haldol PRN    Activity: up with assist  DVT ppx: SCDs  Dispo: tbd    Plan d/w Dr. Lyn HollingsheadAlexander, Dr. Sharol HarnessMcLane, Dr. Denita LungSanghani. Pt is not competent to  make the decision to leave AMA with a history of multiple brain surgeries, short term memory problems, prior radiation, multiple seizure/psych medications. Pt does not have depth of understanding of medical problem. Therefore, he is not safe to sign out AMA if he chooses to do so and we will have to obtain a TDO if he tries to do this.      Brent GeneralJaime M Markham, NP

## 2016-08-03 NOTE — Progress Notes (Signed)
Problem: Mobility Impaired (Adult and Pediatric)  Goal: *Acute Goals and Plan of Care (Insert Text)  Physical Therapy Goals  Initiated 08/03/2016  1. Patient will move from supine to sit and sit to supine and scoot up and down in bed with supervision/set-up within 4 day(s).   2. Patient will transfer from bed to chair and chair to bed with supervision/set-up using the least restrictive device within 4 day(s).  3. Patient will perform sit to stand with supervision/set-up within 4 day(s).  4. Patient will ambulate with minimal assistance/contact guard assist for 150 feet with the least restrictive device within 4 day(s).   5. Patient will ascend/descend 3 stairs with no handrail(s) with minimal assistance/contact guard assist within 4 day(s).  6. Patient will improve Berg Balance score by 7 points within 7 days.  PHYSICAL THERAPY EVALUATION- NEURO POPULATION     Patient: Carl Howell (40 y.o. 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 Pt was admitted on 9/17 s/p falling down stairs (likely due to seizure) at home resulting in SDH and unstable T5 compression fracture. SDH being managed conservatively at this time and pt underwent T5 lami with posterior T3-8 fusion on 08/02/16. PTA, pt reports that he lived at home with his mother (3 steps with no rails to enter) and was indep with all ADLs and mobility with occasional use of  SPC. Pt was cleared for mobilization by RN - received supine in bed, drowsy but agreeable to participation in session. Provided education on back precautions and log roll sequencing - required frequent cues to slow pace for safety to allow for line management - mod A to come to full upright position. Denies dizziness with positional change. He was able to complete sit>stand with mod A and progress gait training to approx 63ft with mod A x2 with noted ataxia/poor coordination, decreased step lengths/foot clearance, and increasing posterior lean with fatigue and approaching chair. Pt remained up in chair at end of session, VSS. He appears significantly below his baseline level and would benefit from discharge to rehab prior to returning home. He appears motivated to work with therapy and can tolerate 3 hrs of therapy.      Patient will benefit from skilled intervention to address the above impairments.  Patient???s rehabilitation potential is considered to be Good  Factors which may influence rehabilitation potential include:   [X]            None noted  [ ]            Mental ability/status  [ ]            Medical condition  [ ]            Home/family situation and support systems  [ ]            Safety awareness  [ ]            Pain tolerance/management  [ ]   Other:        PLAN :  Recommendations and Planned Interventions:  [X]              Bed Mobility Training             [X]       Neuromuscular Re-Education  [X]              Transfer Training                   [ ]       Orthotic/Prosthetic Training  [X]              Gait Training                         [ ]       Modalities  [X]              Therapeutic Exercises           [ ]       Edema Management/Control  [X]              Therapeutic Activities            [X]       Patient and Family Training/Education  [ ]              Other (comment):  Frequency/Duration: Patient will be followed by physical therapy twice daily to address goals.   Discharge Recommendations: Inpatient Rehab  Further Equipment Recommendations for Discharge: to be determined       SUBJECTIVE:   Patient stated ???That was harder than it normally is.???      OBJECTIVE DATA SUMMARY:   HISTORY:    Past Medical History:   Diagnosis Date   ??? Cancer of brain (HCC)     ??? Epilepsy (HCC) unknown     Past Surgical History:   Procedure Laterality Date   ??? BRAIN AVM SURG,DURAL,COMPLX         brain surgery - unknown had brain tumor   ??? KIDNEY DIALYSATE DELIVRY SYS         kidney surgery - unknown   ??? NEUROLOGICAL PROCEDURE UNLISTED         craniotomy     Prior Level of Function/Home Situation: Lives with his mother in a 2 story house, 3 steps to enter with no rails. Indep with ADLs and mobility with SPC  Personal factors and/or comorbidities impacting plan of care: hx of seizures, crani x2 with excision of  tumors     Home Situation  Home Environment: Private residence  # Steps to Enter: 3  Rails to Enter: No  One/Two Story Residence: Two Engineer, materialsstory  # of Interior Steps: 13  Living Alone: No  Support Systems: Family member(s)  Patient Expects to be Discharged to:: Rehabilitation facility  Current DME Used/Available at Home: Cane, straight     EXAMINATION/PRESENTATION/DECISION MAKING:   Critical Behavior:  Neurologic State: Drowsy, Eyes open to voice  Orientation Level: Oriented X4  Cognition: Follows commands, Poor safety awareness, Decreased attention/concentration  Safety/Judgement: Decreased awareness of environment, Decreased awareness of need for assistance, Decreased awareness of need for safety, Decreased insight into deficits  Hearing:  Auditory  Auditory Impairment: None  Skin:  Intact where exposed, incision covered with intact dressing  Edema: none noted   Range Of Motion:  AROM: Generally decreased, functional        Strength:    Strength: Generally decreased, functional     Tone &  Sensation:   Tone: Abnormal (Todds Paralysis L UE)  Sensation: Intact      Coordination:   Coordination: Generally decreased, functional  Vision:   Tracking: Able to track stimulus in all quadrants w/o difficulty  Diplopia: No  Functional Mobility:  Bed Mobility:     Supine to Sit: Moderate assistance     Transfers:  Sit to Stand: Moderate assistance  Stand to Sit: Moderate assistance     Balance:   Sitting: Intact;With support  Standing: Impaired;With support  Standing - Static: Fair;Constant support  Standing - Dynamic : Poor  Ambulation/Gait Training:  Distance (ft): 25 Feet (ft)  Assistive Device: Gait belt ( B HHA)  Ambulation - Level of Assistance: Moderate assistance;Assist x2  Gait Description (WDL): Exceptions to WDL  Gait Abnormalities: Ataxic;Decreased step clearance;Path deviations;Trunk sway increased  Base of Support: Narrowed  Speed/Cadence: Slow;Shuffled;Pace decreased (<100 feet/min)  Step Length: Right shortened;Left shortened     Functional Measure  Berg Balance Test:      Sitting to Standing: 0  Standing Unsupported: 0  Sitting with Back Unsupported: 1  Standing to Sitting: 0  Transfers: 0  Standing Unsupported with Eyes Closed: 0  Standing Unsupported with Feet Together: 0  Reach Forward with Outstretched Arm: 0  Pick Up Object: 0  Turn to Look Over Shoulders: 0  Turn 360 Degrees: 0  Alternate Foot on Step/Stool: 0  Standing Unsupported One Foot in Front: 0  Stand on One Leg: 0  Total: 1             56=Maximum possible score;   0-20=High fall risk  21-40=Moderate fall risk   41-56=Low fall risk      Berg Balance Test and G-code impairment scale:  Percentage of Impairment CH     0%    CI     1-19% CJ     20-39% CK     40-59% CL     60-79% CM     80-99% CN      100%   Berg   Score 0-56 56 45-55 34-44 23-33 12-22 1-11 0         G codes:  In compliance with CMS???s Claims Based Outcome Reporting, the following G-code set was chosen for this patient based on their primary functional limitation being treated:     The outcome measure chosen to determine the severity of the functional  limitation was the [place] with a score of 1/56 which was correlated with the impairment scale.      ?? Mobility - Walking and Moving Around:              [phone] - CURRENT STATUS:    CM - 80%-99% impaired, limited or restricted              [id number] - GOAL STATUS:           CL - 60%-79% impaired, limited or restricted              [id number] - D/C STATUS:                       ---------------To be determined---------------       Physical Therapy Evaluation Charge Determination   History Examination Presentation Decision-Making   HIGH Complexity :3+ comorbidities / personal factors will impact the outcome/ POC  MEDIUM Complexity : 3 Standardized tests and measures addressing body structure, function, activity limitation and / or participation in recreation  LOW Complexity :  Stable, uncomplicated  HIGH Complexity : FOTO score of 1- 25       Based on the above components, the patient evaluation is determined to be of the following complexity level: LOW      Pain:  Pain Scale 1: Numeric (0 - 10)  Pain Intensity 1: 4  Pain Location 1: Back  Pain Orientation 1: Posterior  Pain Description 1: Aching  Pain Intervention(s) 1: Medication (see MAR);Encouraged PCA  Activity Tolerance:   VSS. Drowsy but participatory      Please refer to the flowsheet for vital signs taken during this treatment.  After treatment:   [X]      Patient left in no apparent distress sitting up in chair  [ ]      Patient left in no apparent distress in bed  [X]      Call bell left within reach  [X]      Nursing notified  [ ]      Caregiver present  [X]      Chair alarm activated      COMMUNICATION/EDUCATION:   The patient???s plan of care was discussed with: Registered Nurse.     [X]   Fall prevention education was provided and the patient/caregiver indicated understanding.  [X]   Patient/family have participated as able in goal setting and plan of care.  [X]   Patient/family agree to work toward stated goals and plan of care.   [ ]   Patient understands intent and goals of therapy, but is neutral about his/her participation.  [ ]   Patient is unable to participate in goal setting and plan of care.     Thank you for this referral.  Andree Coss, PT , DPT   Time Calculation: 26 mins

## 2016-08-03 NOTE — Progress Notes (Signed)
Agitated all night. Threatening to leave. Calling police.  Calm this am for me but stating he needs to leave and will not stay.  Rates pain at 4/10.  Moving legs as pre-op.  Stable post surgically but difficult psych dynamic.  Given history of 2 craniotomys, whole brain xrt, mult psych and seizure meds on admission, I cannot consider him competent to make ama decision.  Neuro stable.  Try to control impulses and behavior pharmacologically.

## 2016-08-03 NOTE — Progress Notes (Addendum)
0730: Bedside and Verbal shift change report given to Nicholaus Bloom, [employer] (oncoming nurse) by Charlynne Cousins, RN (offgoing nurse). Report included the following information SBAR, Kardex, Intake/Output, MAR, Accordion and Recent Results.     0930: Patient agreeable to go to CT.    0935: Patient transported to CT.     1000: Returned from CT to ICU - uneventful transport.     1030: Patient's mother at bedside, requested psych consult so patient "could have someone to talk to, he's been through a lot with his wife dying and him having cancer and he's never been to talk to anyone". Requested consult from hospitalist, agreed and ordered per Dr. Denita Lung.     1110: PT at bedside to work with patient.    1130: Patient up in chair, more alert, doing well on less precedex; will continue to titrate as able.     1200: Noted labs drawn this morning were abnormal to patient presentation (i.e. High K and low HGB). Informed neurosurg that they would be redrawn.     1245: After multiple attempts, labs finally obtained and sent.     1315: Immediately after patient's mother left, patient rang out demanding to speak with neurosurgeon who performed surgery or surgeon's supervisor. Contacted neurosurgery NP who states surgeon is in the OR, to give haldol or ativan, and contact hospitalist for TDO if patient tries to leave.     1330: Went to speak with patient, informed that surgeon is in OR - he requests for surgeon to come after he is done in the OR. Had lengthy discussion about why patient cannot go home today and patient verbalizes understanding but also states that he does not believe the hospital is the best place for him to recover. Patient then states "if I'm going to stay here, I just want to be knocked out". Informed patient he is able to have more medications to make him comfortable but he stated this morning he didn't like how they made him feel. Offered patient ativan and haldol,  which he declined because his son is coming to see him. Offered PRN fentanyl which patient agreed to.     1400: PT at bedside to work with patient again.     1500: Patient asking again where surgeon is. Informed he is still in OR.     1600: Dr. Krista Blue, psych, at bedside to speak with patient.     1645: Patient still trying to request to leave. This nurse and patient's mother trying to encourage patient and explain necessity of being in the hospital. Patient continues to state that he feels as though he's being held against his will.     1700: Patient assisted back to bed, required moderate assistance x 2 nurses.     1900: Pt requested something to help with sleep. Informed trazadone is ordered for later this evening but can give ativan; 2mg  given.     1945: Bedside and Verbal shift change report given to [employer], RN (oncoming nurse) by Nicholaus Bloom, RN (offgoing nurse). Report included the following information SBAR, Kardex, Intake/Output, MAR, Accordion and Recent Results.

## 2016-08-03 NOTE — Consults (Signed)
PSYCHIATRIC CONSULTATION                         IDENTIFICATION:    Patient Name  Carl Howell   Date of Birth 10-21-1976   CSN [id number]   Medical Record Number  [id number]      Age  40 y.o.   PCP Tommi Rumps, MD   Admit date:  07/30/2016    Room Number  7110/01  @ [facility]   Date of Service  08/03/2016            HISTORY         REASON FOR HOSPITALIZATION/CONSULTATION:  Admitted following seizure and fall with subdural hematoma, now psychiatry asked to see secondary to agitated and angry statements re wanting to leave hospital  CC: "was just mad and they took it to far"    HISTORY OF PRESENT ILLNESS:    Pt with recovery from brain cancer and states doing well.  Lives with mother and two younger children.  States he is managing things well looking after his children despite not being able to work because of med/physical condition.  Lack of resources bothers him and knowledge of illness going on is distressful.  Pt states he was frustrated earlier in the day and then became obstreperous because of "it never ends."    Pt was opiate + on admission and will follow up with pt on substance and psychiatric topics.     ALLERGIES:  No Known Allergies   MEDICATIONS PRIOR TO ADMISSION:  Prescriptions Prior to Admission   Medication Sig   ??? venlafaxine-SR (EFFEXOR XR) 150 mg capsule Take 150 mg by mouth daily.   ??? ACETAMINOPHEN/DIPHENHYDRAMINE (TYLENOL PM PO) Take 2 Tabs by mouth nightly as needed for Other (Sleep).   ??? cholecalciferol (VITAMIN D3) 1,000 unit tablet Take 2,000 Units by mouth daily.   ??? levETIRAcetam (KEPPRA) 500 mg tablet Take 1,750 mg by mouth two (2) times a day.   ??? lacosamide (VIMPAT) 150 mg tab tablet Take 150 mg by mouth two (2) times a day.   ??? LORazepam (ATIVAN) 1 mg tablet Take 1 mg by mouth three (3) times daily as needed for Anxiety.   ??? DANDELION PO Take 1,040 mg by mouth daily. Takes 2 capsules (520 mg each) daily   ??? ASHWAGANDHA ROOT EXTRACT,BULK, Take 500 mg by mouth daily. Takes  2 capsules (250 mg each) daily      PAST MEDICAL HISTORY:  Active Ambulatory Problems     Diagnosis Date Noted   ??? Epilepsy (HCC)    ??? Seizures (HCC) 07/30/2016     Resolved Ambulatory Problems     Diagnosis Date Noted   ??? No Resolved Ambulatory Problems     Past Medical History:   Diagnosis Date   ??? Cancer of brain (HCC)    ??? Epilepsy (HCC) unknown      Past Medical History:   Diagnosis Date   ??? Cancer of brain (HCC)    ??? Epilepsy (HCC) unknown     Past Surgical History:   Procedure Laterality Date   ??? BRAIN AVM SURG,DURAL,COMPLX      brain surgery - unknown had brain tumor   ??? KIDNEY DIALYSATE DELIVRY SYS      kidney surgery - unknown   ??? NEUROLOGICAL PROCEDURE UNLISTED      craniotomy      SOCIAL HISTORY: lives with 2 children, mother.  Difficulty getting around  Social  History     Social History Narrative     Social History   Substance Use Topics   ??? Smoking status: Former Smoker     Packs/day: 0.25     Types: Cigarettes   ??? Smokeless tobacco: Never Used   ??? Alcohol use No      FAMILY HISTORY: History reviewed. No pertinent family history.  History reviewed. No pertinent family history.    REVIEW of SYSTEMS:   *Generalized pain but mostly in back where he a fusion related to fall and his misgivings.**             MENTAL STATUS EXAM & VITALS       MENTAL STATUS EXAM:    FINDINGS WITHIN NORMAL LIMITS (WNL) UNLESS OTHERWISE STATED BELOW:    Orientation Alert and oriented x 4.   Vital Signs (BP,Pulse, Temp) See below (reviewed 08/03/2016)   Gait and Station nnot observedWithin normal limits   Abnormal Muscular Movements/Tone/Behavior No EPS, no Tardive Dyskinesia, no abnormal muscular movements; wnl tone   Relations Appears friendly, has measured words, not always reliiable.   General Appearance:  Grooming and hygiene ok   Language No aphasia or dysarthria   Speech:  Normal rate, rhythm, volume, not pressured   Thought Processes logical, wnl rate of thoughts, good abstract reasoning and computation   Thought  Associations Goal directed   Thought Content No psychotic sx   Suicidal Ideations None    Homicidal Ideations none   Mood:  euthymic   Affect:  Normal range   Memory recent  adequate   Memory remote:  As above   Concentration/Attention:  fair   Fund of Knowledge Fair/average   Insight:  {Psych insight & judgement fair   Reliability fair   Judgment:  fair          VITALS:     Patient Vitals for the past 24 hrs:   Temp Pulse Resp BP SpO2   08/03/16 2300 - (!) 108 20 110/81 -   08/03/16 2200 - (!) 111 18 (!) 107/93 96 %   08/03/16 2100 - 98 19 - 96 %   08/03/16 2000 97.7 ??F (36.5 ??C) 84 18 93/59 96 %   08/03/16 1900 - 90 16 110/65 97 %   08/03/16 1800 - 91 16 96/56 97 %   08/03/16 1700 - 85 (!) 31 (!) 112/91 99 %   08/03/16 1600 97.7 ??F (36.5 ??C) 94 18 111/68 98 %   08/03/16 1500 - 91 - - -   08/03/16 1400 - (!) 101 14 (!) 106/92 97 %   08/03/16 1200 - 90 17 120/90 100 %   08/03/16 1100 - 72 15 100/66 98 %   08/03/16 1001 - 71 15 94/61 98 %   08/03/16 0900 - 63 16 114/68 100 %   08/03/16 0800 97.7 ??F (36.5 ??C) 70 14 109/78 98 %   08/03/16 0700 - 61 17 107/74 99 %   08/03/16 0600 - 62 17 (!) 111/101 100 %   08/03/16 0500 - 61 16 111/71 100 %   08/03/16 0400 97.6 ??F (36.4 ??C) (!) 59 12 110/76 98 %   08/03/16 0300 - 71 18 (!) 88/56 99 %   08/03/16 0200 - 66 15 93/61 98 %   08/03/16 0100 - 83 15 (!) 88/44 97 %   08/03/16 0030 - 87 20 - 97 %   08/03/16 0000 97.7 ??F (36.5 ??C) 95 16 128/90 98 %  08/02/16 2330 - 90 12 - 98 %   08/02/16 2315 - 100 23 - 99 %              DATA     LABORATORY DATA:  Labs Reviewed   CBC WITH AUTOMATED DIFF - Abnormal; Notable for the following:        Result Value    WBC 14.5 (*)     RBC 3.99 (*)     HCT 36.3 (*)     NEUTROPHILS 85 (*)     LYMPHOCYTES 8 (*)     ABS. NEUTROPHILS 12.2 (*)     ABS. MONOCYTES 1.1 (*)     All other components within normal limits   METABOLIC PANEL, BASIC - Abnormal; Notable for the following:     Sodium 135 (*)     Glucose 103 (*)     BUN/Creatinine ratio 11 (*)      All other components within normal limits   METABOLIC PANEL, BASIC - Abnormal; Notable for the following:     Sodium 135 (*)     Creatinine 0.63 (*)     All other components within normal limits   CBC WITH AUTOMATED DIFF - Abnormal; Notable for the following:     RBC 3.73 (*)     HGB 11.6 (*)     HCT 34.5 (*)     All other components within normal limits   METABOLIC PANEL, BASIC - Abnormal; Notable for the following:     Sodium 134 (*)     All other components within normal limits   CBC W/O DIFF - Abnormal; Notable for the following:     RBC 1.96 (*)     HGB 6.0 (*)     HCT 18.5 (*)     PLATELET 137 (*)     All other components within normal limits   METABOLIC PANEL, BASIC - Abnormal; Notable for the following:     Potassium 6.2 (*)     Chloride 113 (*)     Anion gap 4 (*)     BUN 5 (*)     Creatinine 0.49 (*)     BUN/Creatinine ratio 10 (*)     Calcium 7.2 (*)     All other components within normal limits   CBC WITH AUTOMATED DIFF - Abnormal; Notable for the following:     RBC 3.48 (*)     HGB 10.6 (*)     HCT 32.3 (*)     All other components within normal limits   METABOLIC PANEL, BASIC - Abnormal; Notable for the following:     BUN/Creatinine ratio 8 (*)     Calcium 8.3 (*)     All other components within normal limits   MAGNESIUM   PHOSPHORUS   PROTHROMBIN TIME + INR   PTT   TYPE & SCREEN     Admission on 07/30/2016   Component Date Value Ref Range Status   ??? WBC 07/31/2016 14.5* 4.1 - 11.1 K/uL Final   ??? RBC 07/31/2016 3.99* 4.10 - 5.70 M/uL Final   ??? HGB 07/31/2016 12.4  12.1 - 17.0 g/dL Final   ??? HCT 16/08/9603 36.3* 36.6 - 50.3 % Final   ??? MCV 07/31/2016 91.0  80.0 - 99.0 FL Final   ??? MCH 07/31/2016 31.1  26.0 - 34.0 PG Final   ??? MCHC 07/31/2016 34.2  30.0 - 36.5 g/dL Final   ??? RDW 54/07/8118 12.4  11.5 - 14.5 % Final   ???  PLATELET 07/31/2016 277  150 - 400 K/uL Final   ??? NEUTROPHILS 07/31/2016 85* 32 - 75 % Final   ??? LYMPHOCYTES 07/31/2016 8* 12 - 49 % Final   ??? MONOCYTES 07/31/2016 7  5 - 13 % Final   ???  EOSINOPHILS 07/31/2016 0  0 - 7 % Final   ??? BASOPHILS 07/31/2016 0  0 - 1 % Final   ??? ABS. NEUTROPHILS 07/31/2016 12.2* 1.8 - 8.0 K/UL Final   ??? ABS. LYMPHOCYTES 07/31/2016 1.2  0.8 - 3.5 K/UL Final   ??? ABS. MONOCYTES 07/31/2016 1.1* 0.0 - 1.0 K/UL Final   ??? ABS. EOSINOPHILS 07/31/2016 0.0  0.0 - 0.4 K/UL Final   ??? ABS. BASOPHILS 07/31/2016 0.0  0.0 - 0.1 K/UL Final   ??? Magnesium 07/31/2016 2.0  1.6 - 2.4 mg/dL Final   ??? Sodium 45/40/9811 135* 136 - 145 mmol/L Final   ??? Potassium 07/31/2016 3.8  3.5 - 5.1 mmol/L Final   ??? Chloride 07/31/2016 102  97 - 108 mmol/L Final   ??? CO2 07/31/2016 22  21 - 32 mmol/L Final   ??? Anion gap 07/31/2016 11  5 - 15 mmol/L Final   ??? Glucose 07/31/2016 103* 65 - 100 mg/dL Final   ??? BUN 91/47/8295 9  6 - 20 MG/DL Final   ??? Creatinine 07/31/2016 0.82  0.70 - 1.30 MG/DL Final   ??? BUN/Creatinine ratio 07/31/2016 11* 12 - 20   Final   ??? GFR est AA 07/31/2016 >60  >60 ml/min/1.21m2 Final   ??? GFR est non-AA 07/31/2016 >60  >60 ml/min/1.39m2 Final   ??? Calcium 07/31/2016 9.1  8.5 - 10.1 MG/DL Final   ??? Phosphorus 07/31/2016 2.7  2.6 - 4.7 MG/DL Final   ??? Sodium 62/13/0865 135* 136 - 145 mmol/L Final   ??? Potassium 08/01/2016 3.6  3.5 - 5.1 mmol/L Final   ??? Chloride 08/01/2016 101  97 - 108 mmol/L Final   ??? CO2 08/01/2016 25  21 - 32 mmol/L Final   ??? Anion gap 08/01/2016 9  5 - 15 mmol/L Final   ??? Glucose 08/01/2016 96  65 - 100 mg/dL Final   ??? BUN 78/46/9629 11  6 - 20 MG/DL Final   ??? Creatinine 08/01/2016 0.63* 0.70 - 1.30 MG/DL Final   ??? BUN/Creatinine ratio 08/01/2016 17  12 - 20   Final   ??? GFR est AA 08/01/2016 >60  >60 ml/min/1.83m2 Final   ??? GFR est non-AA 08/01/2016 >60  >60 ml/min/1.61m2 Final   ??? Calcium 08/01/2016 9.1  8.5 - 10.1 MG/DL Final   ??? WBC 52/84/1324 7.1  4.1 - 11.1 K/uL Final   ??? RBC 08/02/2016 3.73* 4.10 - 5.70 M/uL Final   ??? HGB 08/02/2016 11.6* 12.1 - 17.0 g/dL Final   ??? HCT 40/08/2724 34.5* 36.6 - 50.3 % Final   ??? MCV 08/02/2016 92.5  80.0 - 99.0 FL Final   ??? MCH  08/02/2016 31.1  26.0 - 34.0 PG Final   ??? MCHC 08/02/2016 33.6  30.0 - 36.5 g/dL Final   ??? RDW 36/64/4034 12.5  11.5 - 14.5 % Final   ??? PLATELET 08/02/2016 210  150 - 400 K/uL Final   ??? NEUTROPHILS 08/02/2016 63  32 - 75 % Final   ??? LYMPHOCYTES 08/02/2016 23  12 - 49 % Final   ??? MONOCYTES 08/02/2016 13  5 - 13 % Final   ??? EOSINOPHILS 08/02/2016 1  0 - 7 % Final   ???  BASOPHILS 08/02/2016 0  0 - 1 % Final   ??? ABS. NEUTROPHILS 08/02/2016 4.5  1.8 - 8.0 K/UL Final   ??? ABS. LYMPHOCYTES 08/02/2016 1.6  0.8 - 3.5 K/UL Final   ??? ABS. MONOCYTES 08/02/2016 0.9  0.0 - 1.0 K/UL Final   ??? ABS. EOSINOPHILS 08/02/2016 0.1  0.0 - 0.4 K/UL Final   ??? ABS. BASOPHILS 08/02/2016 0.0  0.0 - 0.1 K/UL Final   ??? Sodium 08/02/2016 134* 136 - 145 mmol/L Final   ??? Potassium 08/02/2016 3.9  3.5 - 5.1 mmol/L Final   ??? Chloride 08/02/2016 101  97 - 108 mmol/L Final   ??? CO2 08/02/2016 23  21 - 32 mmol/L Final   ??? Anion gap 08/02/2016 10  5 - 15 mmol/L Final   ??? Glucose 08/02/2016 91  65 - 100 mg/dL Final   ??? BUN 09/81/191409/19/2017 12  6 - 20 MG/DL Final   ??? Creatinine 08/02/2016 0.72  0.70 - 1.30 MG/DL Final   ??? BUN/Creatinine ratio 08/02/2016 17  12 - 20   Final   ??? GFR est AA 08/02/2016 >60  >60 ml/min/1.3773m2 Final   ??? GFR est non-AA 08/02/2016 >60  >60 ml/min/1.9173m2 Final   ??? Calcium 08/02/2016 8.6  8.5 - 10.1 MG/DL Final   ??? INR 78/29/562109/19/2017 1.0  0.9 - 1.1   Final   ??? Prothrombin time 08/02/2016 10.3  9.0 - 11.1 sec Final   ??? aPTT 08/02/2016 28.6  22.1 - 32.5 sec Final   ??? aPTT, therapeutic range 08/02/2016      58.0 - 77.0 SECS Final   ??? Crossmatch Expiration 08/02/2016 08/05/2016   Final   ??? ABO/Rh(D) 08/02/2016 A POSITIVE   Final   ??? Antibody screen 08/02/2016 NEG   Final   ??? Unit number 08/02/2016 [id number]   Final   ??? Blood component type 08/02/2016 RC LR AS1   Final   ??? Unit division 08/02/2016 00   Final   ??? Status of unit 08/02/2016 REL FROM [facility]   Final   ??? Crossmatch result 08/02/2016 Compatible   Final   ??? Unit number 08/02/2016  [id number]   Final   ??? Blood component type 08/02/2016 RC LR AS1   Final   ??? Unit division 08/02/2016 00   Final   ??? [facility]   [facility]   ??? Crossmatch result 08/02/2016 Compatible   Final   ??? WBC 08/03/2016 5.5  4.1 - 11.1 K/uL Final   ??? RBC 08/03/2016 1.96* 4.10 - 5.70 M/uL Final   ??? HGB 08/03/2016 6.0* 12.1 - 17.0 g/dL Final   ??? HCT 72/53/664409/20/2017 18.5* 36.6 - 50.3 % Final   ??? MCV 08/03/2016 94.4  80.0 - 99.0 FL Final   ??? MCH 08/03/2016 30.6  26.0 - 34.0 PG Final   ??? MCHC 08/03/2016 32.4  30.0 - 36.5 g/dL Final   ??? RDW 03/47/425909/20/2017 12.3  11.5 - 14.5 % Final   ??? PLATELET 08/03/2016 137* 150 - 400 K/uL Final   ??? Sodium 08/03/2016 142  136 - 145 mmol/L Final   ??? Potassium 08/03/2016 6.2* 3.5 - 5.1 mmol/L Final   ??? Chloride 08/03/2016 113* 97 - 108 mmol/L Final   ??? CO2 08/03/2016 25  21 - 32 mmol/L Final   ??? Anion gap 08/03/2016 4* 5 - 15 mmol/L Final   ??? Glucose 08/03/2016 82  65 - 100 mg/dL Final   ??? BUN 56/38/756409/20/2017 5* 6 -  20 MG/DL Final   ??? Creatinine 08/03/2016 0.49* 0.70 - 1.30 MG/DL Final   ??? BUN/Creatinine ratio 08/03/2016 10* 12 - 20   Final   ??? GFR est AA 08/03/2016 >60  >60 ml/min/1.73m2 Final   ??? GFR est non-AA 08/03/2016 >60  >60 ml/min/1.39m2 Final   ??? Calcium 08/03/2016 7.2* 8.5 - 10.1 MG/DL Final   ??? WBC 16/08/9603 7.7  4.1 - 11.1 K/uL Final   ??? RBC 08/03/2016 3.48* 4.10 - 5.70 M/uL Final   ??? HGB 08/03/2016 10.6* 12.1 - 17.0 g/dL Final   ??? HCT 54/07/8118 32.3* 36.6 - 50.3 % Final   ??? MCV 08/03/2016 92.8  80.0 - 99.0 FL Final   ??? MCH 08/03/2016 30.5  26.0 - 34.0 PG Final   ??? MCHC 08/03/2016 32.8  30.0 - 36.5 g/dL Final   ??? RDW 14/78/2956 12.3  11.5 - 14.5 % Final   ??? PLATELET 08/03/2016 275  150 - 400 K/uL Final   ??? NEUTROPHILS 08/03/2016 71  32 - 75 % Final   ??? LYMPHOCYTES 08/03/2016 17  12 - 49 % Final   ??? MONOCYTES 08/03/2016 11  5 - 13 % Final   ??? EOSINOPHILS 08/03/2016 1  0 - 7 % Final   ??? BASOPHILS 08/03/2016 0  0 - 1 % Final   ??? ABS. NEUTROPHILS 08/03/2016 5.5  1.8 - 8.0  K/UL Final   ??? ABS. LYMPHOCYTES 08/03/2016 1.3  0.8 - 3.5 K/UL Final   ??? ABS. MONOCYTES 08/03/2016 0.8  0.0 - 1.0 K/UL Final   ??? ABS. EOSINOPHILS 08/03/2016 0.1  0.0 - 0.4 K/UL Final   ??? ABS. BASOPHILS 08/03/2016 0.0  0.0 - 0.1 K/UL Final   ??? Sodium 08/03/2016 137  136 - 145 mmol/L Final   ??? Potassium 08/03/2016 3.8  3.5 - 5.1 mmol/L Final   ??? Chloride 08/03/2016 103  97 - 108 mmol/L Final   ??? CO2 08/03/2016 26  21 - 32 mmol/L Final   ??? Anion gap 08/03/2016 8  5 - 15 mmol/L Final   ??? Glucose 08/03/2016 99  65 - 100 mg/dL Final   ??? BUN 21/30/8657 6  6 - 20 MG/DL Final   ??? Creatinine 08/03/2016 0.71  0.70 - 1.30 MG/DL Final   ??? BUN/Creatinine ratio 08/03/2016 8* 12 - 20   Final   ??? GFR est AA 08/03/2016 >60  >60 ml/min/1.1m2 Final   ??? GFR est non-AA 08/03/2016 >60  >60 ml/min/1.4m2 Final   ??? Calcium 08/03/2016 8.3* 8.5 - 10.1 MG/DL Final        RADIOLOGY REPORTS:    Results from Hospital Encounter encounter on 07/30/16   XR SPINE THORAC SNGL V   Narrative EXAM:  XR SPINE THORAC SNGL V   INDICATION: Intra-Op.  COMPARISON: None.    FINDINGS: A single digital image of the midthoracic spine demonstrates  retractors in the soft tissues. There are rods and screws from T4 to T8. The  patient is intubated and there is a temperature probe in the pharynx.         Impression IMPRESSION: Thoracic spine fusion in progress.    INTERPRETATION PROVIDED FOR COMPLIANCE ONLY AT NO CHARGE      Xr Chest Sngl V    Result Date: 07/31/2016  PORTABLE CHEST RADIOGRAPH/S: 07/31/2016 1:56 PM INDICATION: Vomiting, aspiration pneumonia, new infiltrate. COMPARISON: None. TECHNIQUE: Portable frontal supine radiograph/s of the chest. FINDINGS: The lungs are clear. The central airways are patent. No large pneumothorax  or pleural effusion within the limitation of supine technique.     IMPRESSION: Clear lungs.    Xr Spine Thorac Sngl V    Result Date: 08/02/2016  EXAM:  XR SPINE THORAC SNGL V INDICATION: Intra-Op. COMPARISON: None. FINDINGS: A single  digital image of the midthoracic spine demonstrates retractors in the soft tissues. There are rods and screws from T4 to T8. The patient is intubated and there is a temperature probe in the pharynx.     IMPRESSION: Thoracic spine fusion in progress. INTERPRETATION PROVIDED FOR COMPLIANCE ONLY AT NO CHARGE     Xr Spine Thorac 3 V    Result Date: 07/31/2016  THORACIC SPINE, 3 VIEWS. 07/31/2016 1:56 PM INDICATION: back pain after fall. COMPARISON: None. FINDINGS: Frontal, lateral, and swimmer's radiographs of the thoracic spine. Loss of height in an upper thoracic vertebral body is consistent with an age-indeterminate compression fracture. The cervicothoracic junction is obscured on both lateral radiographs. Thoracic alignment and vertebral body heights are otherwise normal.     IMPRESSION: Age-indeterminate upper thoracic compression fracture. The findings were called to Dr. Penelope Galas on 07/31/2016 2:07 PM by Dr. Reed Breech. On discussion with Dr. Denita Lung, this is acute, as the patient fell 8 feet and has severe back pain. Consider thoracolumbar MRI for evaluation of occult fractures. 789     Xr Spine Lumb 2 Or 3 V    Result Date: 07/31/2016  LUMBAR SPINE, 2 VIEWS. 07/31/2016 1:56 PM INDICATION: Back pain after 8 foot fall COMPARISON: None. FINDINGS: Frontal and lateral lumbar radiographs. Lumbar alignment and vertebral body heights are normal. The lateral radiograph is motion limited.     IMPRESSION: Normal alignment and vertebral body heights.    Xr Fluoroscopy Over 60 Minutes    Result Date: 08/02/2016  Fluoroscopy time was provided.     IMPRESSION:  Fluoroscopy time was provided. Fluoro dose:  30.62 mGy vk    Ct Head Wo Cont    Result Date: 08/02/2016  EXAM:  CT HEAD WITHOUT CONTRAST INDICATION: Subdural hematoma. COMPARISON: 07/31/2016. CONTRAST: None. TECHNIQUE: Unenhanced CT of the head was performed using 5 mm images. Brain and bone windows were generated. Sagittal and coronal reformations were  generated. CT dose reduction was achieved through use of a standardized protocol tailored for this examination and automatic exposure control for dose modulation. CT dose reduction was achieved through use of a standardized protocol tailored for this examination and automatic exposure control for dose modulation. Adaptive statistical iterative reconstruction (ASIR) was utilized for this examination. FINDINGS: There is a right parietal craniotomy. There is a small right parietal, left parafalcine and left subdural hematoma which is unchanged.  There is encephalomalacia in the right parietal lobe with some surrounding decreased white matter attenuation. The ventricles are normal in size, shape and configuration and midline.  There is no new intracranial hemorrhage.  There is no extra-axial collection, mass, mass effect or midline shift.  The basilar cisterns are open.  No acute infarct is identified. The bone windows demonstrate no abnormalities.  The visualized portions of the paranasal sinuses and mastoid air cells are clear.     IMPRESSION: Right parietal craniotomy with right parietal encephalomalacia and small amount of subdural hemorrhage as described above, unchanged.      Ct Head Wo Cont    Result Date: 07/31/2016  HEAD CT WITHOUT CONTRAST: 07/31/2016 9:00 AM INDICATION: Sub-dural hemorrhage COMPARISON: 07/30/2016. PROCEDURE: Axial images of the head were obtained without contrast. Coronal and sagittal reformats were performed. CT dose  reduction was achieved through use of a standardized protocol tailored for this examination and automatic exposure control for dose modulation.  Adaptive statistical iterative reconstruction (ASIR) was utilized. FINDINGS: There is chronic encephalomalacia in the right parietal and occipital lobes post right parietal craniotomy. Small right parietal subdural hemorrhage is stable. The axial obliquity of the scan mimics midline shift, however, there is no significant right to left  midline shift. No new hemorrhage. The ventricles and sulci are appropriate in size and configuration for age. No new loss of gray-white differentiation to suggest late acute or early subacute infarction. An oval, circumscribed, superficial skin lesion superior to the right parotid likely represents a sebaceous cyst.     IMPRESSION: Stable small right parietal subdural hemorrhage.    Ct Head Wo Cont    Result Date: 07/30/2016  EXAM:  CT HEAD WO CONT INDICATION:   Epilepsy; f/u subdural COMPARISON: None. CONTRAST:  None. TECHNIQUE: Unenhanced CT of the head was performed using 5 mm images. Brain and bone windows were generated.  CT dose reduction was achieved through use of a standardized protocol tailored for this examination and automatic exposure control for dose modulation.  FINDINGS: A right subdural acute hematoma is again demonstrated measuring up to 5 mm in thickness. However, there is mildly increased underlying mass effect with effacement of the right lateral ventricle and new leftward midline shift of 3 mm. The basal cisterns are patent. There is no new abnormal parenchymal attenuation. A prior right posterior craniotomy is again demonstrated with underlying encephalomalacia. The right posterior subchondral soft tissue hematoma is again demonstrated.     IMPRESSION: Stable thickness of an acute right subdural hematoma. However, there is mildly increased underlying mass effect, effacement of the right lateral ventricle, and new leftward midline shift of 3 mm.     Ct Head Wo Cont    Result Date: 07/30/2016  EXAM:  CT head without contrast INDICATION: Fall down steps. Possible seizure. COMPARISON: MR brain 06/12/2009. TECHNIQUE: Noncontrast head CT. Coronal and sagittal reformats. CT dose reduction was achieved through use of a standardized protocol tailored for this examination and automatic exposure control for dose modulation. FINDINGS: Surgical changes are again seen following a right posterior craniotomy  with underlying encephalomalacia. There is an acute right subdural hematoma measuring up to 5 mm in thickness. There is no significant underlying midline shift or mass effect. The ventricles are stable in size and configuration without hydrocephalus. The basal cisterns are patent. There is a extracalvarial posterior soft tissue hematoma. The visualized paranasal sinuses and mastoid air cells are clear.     IMPRESSION: Acute right subdural hematoma measuring up to 5 mm in thickness. No significant midline shift or mass effect. No acute hydrocephalus. The findings were discussed with Dr. Modesto Charon on 07/30/2016 at 1145 hours by Dr. Jaye Beagle. 789     Ct Spine Cerv Wo Cont    Result Date: 07/30/2016  EXAM:  CT CERVICAL SPINE WITHOUT CONTRAST INDICATION: Fall down stairs. Possible seizure. COMPARISON: None. TECHNIQUE: Helical CT of the cervical spine was performed without contrast. Sagittal and coronal reconstructions were obtained. CT dose reduction was achieved through use of a standardized protocol tailored for this examination and automatic exposure control for dose modulation. CONTRAST: None. FINDINGS: Alignment is within normal limits. There is no fracture or subluxation. Odontoid process is intact. The craniocervical junction is within normal limits. Prevertebral soft tissues are within normal limits. There is no spinal canal or foraminal stenosis.     IMPRESSION: There is no  acute fracture or subluxation.     Ct Spine Thorac Wo Cont    Result Date: 08/03/2016  EXAM:  CT SPINE [facility] WO CONT INDICATION: Unstable burst fracture at T5. Evaluate following fusion.. COMPARISON: 07/31/2016. TECHNIQUE: Multislice helical CT of the thoracic spine was performed. Sagittal and coronal reformations were generated. CT dose reduction was achieved through use of a standardized protocol tailored for this examination and automatic exposure control for dose modulation. FINDINGS: There is been posterior fusion from T3 through T7. There is only  minimal retrolisthesis of posterior body of L5 due to the fracture. Alignment is otherwise anatomic. There are some hardware artifact but no definite spinal stenosis. The T5 comminuted fracture and subtle T6 superior endplate fracture are again noted.     IMPRESSION: Anatomic alignment following thoracic fusion from T3 through T7.    Ct Spine Thorac Wo Cont    Result Date: 07/31/2016  CT OF THE THORACIC AND LUMBAR SPINE WITHOUT CONTRAST: 07/31/2016 5:43 PM INDICATION: Intractable back pain after fall from 8 feet. Thoracic compression fracture on radiography. TECHNIQUE: Axial CT images were obtained of the thoracic and lumbar spine without contrast. Coronal and sagittal reconstructions were performed. CT dose reduction was achieved through use of a standardized protocol tailored for this examination and automatic exposure control for dose modulation. Adaptive statistical iterative reconstruction (ASIR) was utilized. COMPARISON: Same day thoracolumbar radiographs. FINDINGS: A heavily comminuted burst fracture of the T5 vertebral body fracture extends through the anterior, middle, and posterior columns, with a fracture of the spinous process. This is an unstable fracture. Retropulsion causes mild canal stenosis and mild right neural foraminal stenosis. Mild paraspinous hemorrhage and a left proximal rib fracture are associated. There is a more subtle fracture of T6 (2-207, 2-214), with trace paraspinous hemorrhage on image 3-51. There is trace paraspinous hemorrhage on the right at T7 as well (3-59) without a clear fracture. Ossicles of the tips of the T3-T7 spinous processes mimic fractures but are normal variants. Failure of fusion of the posterior elements of T12 and L1 are also normal variants. Mild dependent atelectasis and minimal pleural fluid. Motion limited. No displaced fracture. Lumbar alignment is normal. Vertebral body heights are normal. Incidental note is made of scattered hepatic cysts. L4-S1 facet  osteoarthritis probably causes neural foraminal stenosis, though motion limits evaluation.     IMPRESSION: 1. Heavily comminuted T5 burst fracture involving all 3 columns is unstable. Surgical consultation recommended. Retropulsion causes mild canal and mild right neural foraminal stenosis. 2. Subtle, nondisplaced T6 fracture. Trace paraspinous hemorrhage adjacent T7 without visible fracture. 3. No displaced lumbar fracture; lumbar evaluation limited by motion. The findings were called to Dr. Penelope Galas on 07/31/2016 6:04 PM by Dr. Reed Breech.  [phone]     Ct Spine Lumb Wo Cont    Result Date: 07/31/2016  CT OF THE THORACIC AND LUMBAR SPINE WITHOUT CONTRAST: 07/31/2016 5:43 PM INDICATION: Intractable back pain after fall from 8 feet. Thoracic compression fracture on radiography. TECHNIQUE: Axial CT images were obtained of the thoracic and lumbar spine without contrast. Coronal and sagittal reconstructions were performed. CT dose reduction was achieved through use of a standardized protocol tailored for this examination and automatic exposure control for dose modulation. Adaptive statistical iterative reconstruction (ASIR) was utilized. COMPARISON: Same day thoracolumbar radiographs. FINDINGS: A heavily comminuted burst fracture of the T5 vertebral body fracture extends through the anterior, middle, and posterior columns, with a fracture of the spinous process. This is an unstable fracture. Retropulsion causes mild canal  stenosis and mild right neural foraminal stenosis. Mild paraspinous hemorrhage and a left proximal rib fracture are associated. There is a more subtle fracture of T6 (2-207, 2-214), with trace paraspinous hemorrhage on image 3-51. There is trace paraspinous hemorrhage on the right at T7 as well (3-59) without a clear fracture. Ossicles of the tips of the T3-T7 spinous processes mimic fractures but are normal variants. Failure of fusion of the posterior elements of T12 and L1 are also normal  variants. Mild dependent atelectasis and minimal pleural fluid. Motion limited. No displaced fracture. Lumbar alignment is normal. Vertebral body heights are normal. Incidental note is made of scattered hepatic cysts. L4-S1 facet osteoarthritis probably causes neural foraminal stenosis, though motion limits evaluation.     IMPRESSION: 1. Heavily comminuted T5 burst fracture involving all 3 columns is unstable. Surgical consultation recommended. Retropulsion causes mild canal and mild right neural foraminal stenosis. 2. Subtle, nondisplaced T6 fracture. Trace paraspinous hemorrhage adjacent T7 without visible fracture. 3. No displaced lumbar fracture; lumbar evaluation limited by motion. The findings were called to Dr. Penelope Galas on 07/31/2016 6:04 PM by Dr. Reed Breech.  [phone]              MEDICATIONS       ALL MEDICATIONS  Current Facility-Administered Medications   Medication Dose Route Frequency   ??? haloperidol lactate (HALDOL) injection 2 mg  2 mg IntraVENous Q6H PRN   ??? LORazepam (ATIVAN) injection 2 mg  2 mg IntraVENous Q1H PRN   ??? fentaNYL citrate (PF) injection 50-100 mcg  50-100 mcg IntraVENous Q1H PRN   ??? traZODone (DESYREL) tablet 50 mg  50 mg Oral QHS   ??? levETIRAcetam (KEPPRA) tablet 1,750 mg  1,750 mg Oral Q12H   ??? lacosamide (VIMPAT) tablet 200 mg  200 mg Oral Q12H   ??? sodium chloride (NS) flush 5-10 mL  5-10 mL IntraVENous Q8H   ??? sodium chloride (NS) flush 5-10 mL  5-10 mL IntraVENous PRN   ??? 0.9% sodium chloride infusion 250 mL  250 mL IntraVENous PRN   ??? morphine (PF) PCA 150 mg/30 ml   IntraVENous TITRATE   ??? 0.9% sodium chloride with KCl 20 mEq/L infusion   IntraVENous CONTINUOUS   ??? nicotine (NICODERM CQ) 21 mg/24 hr patch 1 Patch  1 Patch TransDERmal DAILY   ??? oxyCODONE-acetaminophen (PERCOCET 10) 10-325 mg per tablet 1 Tab  1 Tab Oral Q4H PRN   ??? sodium chloride (NS) flush 5-10 mL  5-10 mL IntraVENous Q8H   ??? sodium chloride (NS) flush 5-10 mL  5-10 mL IntraVENous PRN   ???  acetaminophen (TYLENOL) tablet 650 mg  650 mg Oral Q4H PRN   ??? ondansetron (ZOFRAN) injection 4 mg  4 mg IntraVENous Q4H PRN   ??? famotidine (PEPCID) tablet 20 mg  20 mg Oral BID   ??? venlafaxine-SR (EFFEXOR-XR) capsule 150 mg  150 mg Oral DAILY WITH BREAKFAST   ??? dexmedeTOMidine (PRECEDEX) 400 mcg in 0.9% sodium chloride 100 mL infusion  0.2-1.4 mcg/kg/hr IntraVENous TITRATE      SCHEDULED MEDICATIONS  Current Facility-Administered Medications   Medication Dose Route Frequency   ??? traZODone (DESYREL) tablet 50 mg  50 mg Oral QHS   ??? levETIRAcetam (KEPPRA) tablet 1,750 mg  1,750 mg Oral Q12H   ??? lacosamide (VIMPAT) tablet 200 mg  200 mg Oral Q12H   ??? sodium chloride (NS) flush 5-10 mL  5-10 mL IntraVENous Q8H   ??? morphine (PF) PCA 150 mg/30 ml  IntraVENous TITRATE   ??? 0.9% sodium chloride with KCl 20 mEq/L infusion   IntraVENous CONTINUOUS   ??? nicotine (NICODERM CQ) 21 mg/24 hr patch 1 Patch  1 Patch TransDERmal DAILY   ??? sodium chloride (NS) flush 5-10 mL  5-10 mL IntraVENous Q8H   ??? famotidine (PEPCID) tablet 20 mg  20 mg Oral BID   ??? venlafaxine-SR (EFFEXOR-XR) capsule 150 mg  150 mg Oral DAILY WITH BREAKFAST   ??? dexmedeTOMidine (PRECEDEX) 400 mcg in 0.9% sodium chloride 100 mL infusion  0.2-1.4 mcg/kg/hr IntraVENous TITRATE                ASSESSMENT & PLAN        The patient Brigg Cape is a 40 y.o.  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   Closed unstable burst fracture of fifth thoracic vertebra (HCC) (08/01/2016)    Assessment: per surgical team    Plan: as above  Possible polysubstance abuse.    Pt  taking lots of PCA pump hits.  Continued on strong pace re using for pain.  No doubt in distress, likely exaggerated though especially with DUA + for cocaine and THC.  Would be judicious in opioid treatment and how much he takes here.  No psychotic or cognitive distortions.  Likely wanted to be home.  May angle for his [place] of pain medications.  Pt not a risk to self and others.  Did have interactive manner in mind but pt states not ready  Dx: Possible substance dependence.         Will try to explore more.                A coordinated, multidisplinary treatment team round was conducted with the patient; that includes the nurse, unit pharmcist, Administrator all present.     The following regarding medications was addressed during rounds with patient:   the risks and benefits of the proposed medication. The patient was given the opportunity to ask questions. Informed consent given to the use of the above medications.     I will continue to adjust psychiatric and non-psychiatric medications (see above "medication" section and orders section for details) as deemed appropriate & based upon diagnoses and response to treatment.     I have reviewed admission (and previous/old) labs and medical tests in the EHR and or transferring hospital documents. I will continue to order blood tests/labs and diagnostic tests as deemed appropriate and review results as they become available (see orders for details).    I have reviewed old psychiatric and medical records available in the EHR. I Will order additional psychiatric records from other institutions to further elucidate the nature of patient's psychopathology and review once available.    I will gather additional collateral information from friends, family and o/p treatment team to further elucidate the nature of patient's psychopathology and baselline level of psychiatric functioning.  SIGNED:    Billey CoEli J Jermall Isaacson, MD  08/03/2016

## 2016-08-04 MED ORDER — CYCLOBENZAPRINE 10 MG TAB
10 mg | Freq: Three times a day (TID) | ORAL | Status: DC | PRN
Start: 2016-08-04 — End: 2016-08-08
  Administered 2016-08-04 – 2016-08-08 (×5): via ORAL

## 2016-08-04 MED ORDER — SENNOSIDES-DOCUSATE SODIUM 8.6 MG-50 MG TAB
Freq: Every day | ORAL | Status: DC
Start: 2016-08-04 — End: 2016-08-08
  Administered 2016-08-04 – 2016-08-08 (×4): via ORAL

## 2016-08-04 MED ORDER — LORAZEPAM 1 MG TAB
1 mg | ORAL | Status: DC | PRN
Start: 2016-08-04 — End: 2016-08-08
  Administered 2016-08-04 – 2016-08-08 (×12): via ORAL

## 2016-08-04 MED ORDER — OXYCODONE ER 10 MG TABLET,CRUSH RESISTANT,EXTENDED RELEASE 12 HR
10 mg | Freq: Two times a day (BID) | ORAL | Status: DC
Start: 2016-08-04 — End: 2016-08-08
  Administered 2016-08-04 – 2016-08-08 (×9): via ORAL

## 2016-08-04 MED FILL — OXYCODONE-ACETAMINOPHEN 10 MG-325 MG TAB: 10-325 mg | ORAL | Qty: 1

## 2016-08-04 MED FILL — OXYCODONE ER 10 MG TABLET,CRUSH RESISTANT,EXTENDED RELEASE 12 HR: 10 mg | ORAL | Qty: 1

## 2016-08-04 MED FILL — FAMOTIDINE 20 MG TAB: 20 mg | ORAL | Qty: 1

## 2016-08-04 MED FILL — VIMPAT 50 MG TABLET: 50 mg | ORAL | Qty: 4

## 2016-08-04 MED FILL — LORAZEPAM 2 MG/ML IJ SOLN: 2 mg/mL | INTRAMUSCULAR | Qty: 1

## 2016-08-04 MED FILL — PRECEDEX 100 MCG/ML INTRAVENOUS SOLUTION: 100 mcg/mL | INTRAVENOUS | Qty: 4

## 2016-08-04 MED FILL — CYCLOBENZAPRINE 10 MG TAB: 10 mg | ORAL | Qty: 1

## 2016-08-04 MED FILL — NORMAL SALINE FLUSH 0.9 % INJECTION SYRINGE: INTRAMUSCULAR | Qty: 10

## 2016-08-04 MED FILL — VENLAFAXINE SR 150 MG 24 HR CAP: 150 mg | ORAL | Qty: 1

## 2016-08-04 MED FILL — NORMAL SALINE FLUSH 0.9 % INJECTION SYRINGE: INTRAMUSCULAR | Qty: 20

## 2016-08-04 MED FILL — LEVETIRACETAM 250 MG TAB: 250 mg | ORAL | Qty: 1

## 2016-08-04 MED FILL — MORPHINE (PF) 150 MG/30 ML CONCENTRATED INFUSION: 150 mg/30 mL | INTRAVENOUS | Qty: 30

## 2016-08-04 MED FILL — NICOTINE 21 MG/24 HR DAILY PATCH: 21 mg/24 hr | TRANSDERMAL | Qty: 1

## 2016-08-04 MED FILL — OXYCONTIN 10 MG TABLET,CRUSH RESISTANT,EXTENDED RELEASE: 10 mg | ORAL | Qty: 1

## 2016-08-04 MED FILL — ONDANSETRON (PF) 4 MG/2 ML INJECTION: 4 mg/2 mL | INTRAMUSCULAR | Qty: 2

## 2016-08-04 MED FILL — FENTANYL CITRATE (PF) 50 MCG/ML IJ SOLN: 50 mcg/mL | INTRAMUSCULAR | Qty: 2

## 2016-08-04 MED FILL — DOK PLUS 8.6 MG-50 MG TABLET: ORAL | Qty: 1

## 2016-08-04 MED FILL — NS WITH POTASSIUM CHLORIDE 20 MEQ/L IV: 20 mEq/L | INTRAVENOUS | Qty: 1000

## 2016-08-04 MED FILL — TRAZODONE 50 MG TAB: 50 mg | ORAL | Qty: 1

## 2016-08-04 MED FILL — LORAZEPAM 1 MG TAB: 1 mg | ORAL | Qty: 1

## 2016-08-04 NOTE — Progress Notes (Signed)
Problem: Mobility Impaired (Adult and Pediatric)  Goal: *Acute Goals and Plan of Care (Insert Text)  Physical Therapy Goals  Initiated 08/03/2016  1. Patient will move from supine to sit and sit to supine and scoot up and down in bed with supervision/set-up within 4 day(s).   2. Patient will transfer from bed to chair and chair to bed with supervision/set-up using the least restrictive device within 4 day(s).  3. Patient will perform sit to stand with supervision/set-up within 4 day(s).  4. Patient will ambulate with minimal assistance/contact guard assist for 150 feet with the least restrictive device within 4 day(s).   5. Patient will ascend/descend 3 stairs with no handrail(s) with minimal assistance/contact guard assist within 4 day(s).  6. Patient will improve Berg Balance score by 7 points within 7 days.   PHYSICAL THERAPY TREATMENT  Patient: Carl Howell (16(40 y.o. male)  Date: 08/04/2016  Diagnosis: subdural bleed  Subdural hematoma (HCC)  UNKNOWN Subdural hematoma (HCC)  Procedure(s) (LRB):  T5 LAMINECTOMY WITH POSTERIOR INSTRUMENTATION AND FUSION AT T3-8 (N/A) 2 Days Post-Op  Precautions: Back      ASSESSMENT:  Cleared for mobilization by RN, received up in bed. Perseverative on going home today. Patient with poor safety awareness and limited insight into deficits. Gait training trialed without- significant assist required for R weightshift to advance LE. Walked to window and completed calf raises and SLS. Quick to fatigue. Pt requesting to remain up in chair at end of session. Will continue to progress as tolerated. Recommend rehab.   Progression toward goals:  [ ]        Improving appropriately and progressing toward goals  [X]        Improving slowly and progressing toward goals  [ ]        Not making progress toward goals and plan of care will be adjusted       PLAN:  Patient continues to benefit from skilled intervention to address the above impairments.  Continue treatment per established plan of care.   Discharge Recommendations:  Rehab  Further Equipment Recommendations for Discharge:  tbd       SUBJECTIVE:   Patient stated ???So am I going home.???      OBJECTIVE DATA SUMMARY:   Critical Behavior:  Neurologic State: Alert, Eyes open spontaneously, Eyes open to voice  Orientation Level: Oriented X4  Cognition: Follows commands  Safety/Judgement: Decreased awareness of environment, Decreased awareness of need for assistance, Decreased awareness of need for safety, Decreased insight into deficits  Functional Mobility Training:  Bed Mobility:     Supine to Sit: Stand-by asssistance              Transfers:  Sit to Stand: Moderate assistance  Stand to Sit: Minimum assistance                             Balance:  Sitting: Impaired  Sitting - Static: Fair (occasional) (posterior lean)  Sitting - Dynamic: Fair (occasional)  Standing: Impaired;Without support  Standing - Static: Fair;Constant support  Standing - Dynamic : Poor  Ambulation/Gait Training:  Distance (ft): 25 Feet (ft)  Assistive Device: Gait belt (B HHA)  Ambulation - Level of Assistance: Moderate assistance;Assist x2;Maximum assistance        Gait Abnormalities: Ataxic;Decreased step clearance;Path deviations        Base of Support: Narrowed;Shift to right     Speed/Cadence: Pace decreased (<100 feet/min);Shuffled;Slow  Step Length: Right shortened;Left shortened  Pain:  Pain Scale 1: Numeric (0 - 10)  Pain Intensity 1: 7  Pain Location 1: Back  Pain Orientation 1: Posterior  Pain Description 1: Aching  Pain Intervention(s) 1: Medication (see MAR);Rest;Repositioned;Relaxation technique  Activity Tolerance:   Limited by fatigue  Please refer to the flowsheet for vital signs taken during this treatment.  After treatment:   [X]  Patient left in no apparent distress sitting up in chair  [ ]  Patient left in no apparent distress in bed  [X]  Call bell left within reach  [X]  Nursing notified  [X]  Caregiver present  [ ]  Bed alarm activated       COMMUNICATION/EDUCATION:   The patient???s plan of care was discussed with: Occupational Therapist and Registered Nurse.     Patient was educated regarding His deficit(s) of L weakness as this relates to His diagnosis of post fusion.  He demonstrated Poor understanding as evidenced by no participation.     [X]   Fall prevention education was provided and the patient/caregiver indicated understanding.  [X]   Patient/family have participated as able in goal setting and plan of care.  [X]   Patient/family agree to work toward stated goals and plan of care.  [ ]   Patient understands intent and goals of therapy, but is neutral about his/her participation.  [ ]   Patient is unable to participate in goal setting and plan of care.     Thank you for this referral.  Quitman Livings, PT , DPT   Time Calculation: 27 mins

## 2016-08-04 NOTE — Progress Notes (Signed)
Relatively calm overnight.  Will d/c hemovac and pca.  Can go to floor when off precedex.  Try to continue pt/ot.  I doubt he will agree to any rehab

## 2016-08-04 NOTE — Progress Notes (Addendum)
0730. Bedside and Verbal shift change report given to Thomasene Ripple, RN by Dorrene German, RN. Report included the following information SBAR, Kardex, ED Summary, OR Summary, Procedure Summary, Intake/Output, MAR, Accordion, Recent Results, Med Rec Status and Cardiac Rhythm NSR/sinus tach.     0800. Patient assessed. VSS and requesting more pain meds. Explained new orders and meds given  0945. Patient up to chair with two assist.  1000. Hemovac removed. Patient tolerated well  1145. While checking on patient found patient had pulled out both IVs. And demanding to speak to Dr Lyn Hollingshead. IV team paged and neuro surg called

## 2016-08-04 NOTE — Progress Notes (Signed)
Hospitalist Progress Note  Carl Reevesharmesh R Brynli Ollis, Carl Howell  Office: [phone]  Cell: [phone]      Date of Service:  08/04/2016  NAME:  Carl Howell  DOB:  1976/01/30  MRN:  [id number]      Admission Summary:   40 yo male with PMhx of Rt Parietal occipital  Oligoastrocytoma s/p resection 2008, s/p chemo, seizure d/o, admitted for fall and witnessed seizures. Pt was transferred to [facility] for CT showing SDH.     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  - repeat CT this AM stable  - Neurosurgery following, no indication for surgery  - neuro check  - monitor BP    Epilepsy with Breakthrough Seizure   - Keppra 1750mg  BID  - Vimpat increased to 200mg  BID  - Neuro eval appreciated  - seizure precaution    Intractable Back Pain due to Unstable Burst Fx of T5 due to fall  - CT L and T Spine noted  - Neurosurgery on board,   - PT/OT  - Pain management  - s/p T5 laminectomy with post fusion at T3-T7  - pain control  - plan to remove JP drain    Agitation  - patient not competent to make decision to leave AMA as he does not understand severity of his decision and is POD 1 after major spinal surgery. He is at risk of fall and recurrent injury, including paralysis.     S/p Fall likely due to seizure   - PT/OT     Hx of Oligoastrocytoma   - s/p resection 2008 and chemo  - MRI brain to evaluate recurrence     Leukocytosis  - improving  - no signs of infection    Mild Hyponatremia  - resolved    Depression   -c/w Zoloft and Effexor    Tobacco abuse  - Nicotine patch     Code status: FULL  DVT prophylaxis: SCDs    Care Plan discussed with: Patient/Family and Nurse and Dr.Murphy  Disposition: Home w/Family      Hospital Problems  Date Reviewed: 07/31/2016          Codes Class Noted POA    Closed unstable burst fracture of fifth thoracic vertebra (HCC) ICD-10-CM: V78.469GS22.052A  ICD-9-CM: 805.2  08/01/2016 Yes        Glioma (HCC) ICD-10-CM: C71.9  ICD-9-CM: 191.1  07/31/2016 Yes        Seizures (HCC) ICD-10-CM: R56.9  ICD-9-CM: 780.39  07/30/2016 Yes        * (Principal)Subdural hematoma (HCC) ICD-10-CM: I62.00  ICD-9-CM: 432.1  07/30/2016 Unknown        Epilepsy (HCC) ICD-10-CM: G40.909  ICD-9-CM: 345.90  Unknown Yes                Review of Systems:   A comprehensive review of systems was negative except for that written in the HPI.       Vital Signs:    Last 24hrs VS reviewed since prior progress note. Most recent are:  Visit Vitals   ??? BP 104/60   ??? Pulse 88   ??? Temp 97.8 ??F (36.6 ??  C)   ??? Resp 16   ??? Ht 5' 10.98" (1.803 m)   ??? Wt 67.8 kg (149 lb 7.6 oz)   ??? SpO2 99%   ??? BMI 20.86 kg/m2         Intake/Output Summary (Last 24 hours) at 08/04/16 1015  Last data filed at 08/04/16 0900   Gross per 24 hour   Intake          2031.96 ml   Output             1215 ml   Net           816.96 ml        Physical Examination:             Constitutional:  awake and alert    HEENT: Bruise on forehead, Oral mucous moist, oropharynx benign. Neck supple,    Resp:  CTA bilaterally.    CV:  Regular rhythm, normal rate,     GI:  Soft, non distended, non tender. normoactive bowel sounds,      Musculoskeletal:  No edema, warm, 2+ pulses throughout    Neurologic:  alert and alert, old Left facial droop and weakness in UE/LE,      Back: dressing and drain in place        Data Review:    Review and/or order of clinical lab test  Review and/or order of tests in the radiology section of CPT  Review and/or order of tests in the medicine section of CPT      Labs:     Recent Labs      08/03/16   1246  08/03/16   0913   WBC  7.7  5.5   HGB  10.6*  6.0*   HCT  32.3*  18.5*   PLT  275  137*     Recent Labs      08/03/16   1246  08/03/16   0913  08/02/16   0309    NA  137  142  134*   K  3.8  6.2*  3.9   CL  103  113*  101   CO2  26  25  23    BUN  6  5*  12   CREA  0.71  0.49*  0.72   GLU  99  82  91   CA  8.3*  7.2*  8.6     No results for input(s): SGOT, GPT, ALT, AP, TBIL, TBILI, TP, ALB, GLOB, GGT, AML, LPSE in the last 72 hours.    No lab exists for component: AMYP, HLPSE  Recent Labs      08/02/16   0309   INR  1.0   PTP  10.3   APTT  28.6      No results for input(s): FE, TIBC, PSAT, FERR in the last 72 hours.   No results found for: FOL, RBCF   No results for input(s): PH, PCO2, PO2 in the last 72 hours.  No results for input(s): CPK, CKNDX, TROIQ in the last 72 hours.    No lab exists for component: CPKMB  No results found for: CHOL, CHOLX, CHLST, CHOLV, HDL, LDL, LDLC, DLDLP, TGLX, TRIGL, TRIGP, CHHD, CHHDX  Lab Results   Component Value Date/Time    Glucose (POC) 146 07/30/2016 04:14 PM    Glucose (POC) 119 07/30/2016 10:13 AM    Glucose (POC) 88 12/06/2012 01:34 PM     Lab Results  Component Value Date/Time    Color YELLOW/STRAW 05/09/2014 02:48 PM    Appearance CLEAR 05/09/2014 02:48 PM    Specific gravity 1.020 05/09/2014 02:48 PM    pH (UA) 8.5 05/09/2014 02:48 PM    Protein 30 05/09/2014 02:48 PM    Glucose NEGATIVE  05/09/2014 02:48 PM    Ketone NEGATIVE  05/09/2014 02:48 PM    Bilirubin NEGATIVE  05/09/2014 02:48 PM    Urobilinogen 0.2 05/09/2014 02:48 PM    Nitrites NEGATIVE  05/09/2014 02:48 PM    Leukocyte Esterase NEGATIVE  05/09/2014 02:48 PM    Epithelial cells FEW 05/09/2014 02:48 PM    Bacteria NEGATIVE  05/09/2014 02:48 PM    WBC 0-4 05/09/2014 02:48 PM    RBC 0-5 05/09/2014 02:48 PM         Medications Reviewed:     Current Facility-Administered Medications   Medication Dose Route Frequency   ??? oxyCODONE ER (OxyCONTIN) tablet 10 mg  10 mg Oral Q12H   ??? senna-docusate (PERICOLACE) 8.6-50 mg per tablet 1 Tab  1 Tab Oral DAILY   ??? haloperidol lactate (HALDOL) injection 2 mg  2 mg IntraVENous Q6H PRN    ??? LORazepam (ATIVAN) injection 2 mg  2 mg IntraVENous Q1H PRN   ??? fentaNYL citrate (PF) injection 50-100 mcg  50-100 mcg IntraVENous Q1H PRN   ??? traZODone (DESYREL) tablet 50 mg  50 mg Oral QHS   ??? levETIRAcetam (KEPPRA) tablet 1,750 mg  1,750 mg Oral Q12H   ??? lacosamide (VIMPAT) tablet 200 mg  200 mg Oral Q12H   ??? sodium chloride (NS) flush 5-10 mL  5-10 mL IntraVENous Q8H   ??? sodium chloride (NS) flush 5-10 mL  5-10 mL IntraVENous PRN   ??? 0.9% sodium chloride infusion 250 mL  250 mL IntraVENous PRN   ??? 0.9% sodium chloride with KCl 20 mEq/L infusion   IntraVENous CONTINUOUS   ??? nicotine (NICODERM CQ) 21 mg/24 hr patch 1 Patch  1 Patch TransDERmal DAILY   ??? oxyCODONE-acetaminophen (PERCOCET 10) 10-325 mg per tablet 1 Tab  1 Tab Oral Q4H PRN   ??? sodium chloride (NS) flush 5-10 mL  5-10 mL IntraVENous Q8H   ??? sodium chloride (NS) flush 5-10 mL  5-10 mL IntraVENous PRN   ??? acetaminophen (TYLENOL) tablet 650 mg  650 mg Oral Q4H PRN   ??? ondansetron (ZOFRAN) injection 4 mg  4 mg IntraVENous Q4H PRN   ??? famotidine (PEPCID) tablet 20 mg  20 mg Oral BID   ??? venlafaxine-SR (EFFEXOR-XR) capsule 150 mg  150 mg Oral DAILY WITH BREAKFAST   ??? dexmedeTOMidine (PRECEDEX) 400 mcg in 0.9% sodium chloride 100 mL infusion  0.2-1.4 mcg/kg/hr IntraVENous TITRATE     ______________________________________________________________________  EXPECTED LENGTH OF STAY: 3d 7h  ACTUAL LENGTH OF STAY:          5                 Carl Reevesharmesh R Polly Barner, Carl Howell

## 2016-08-04 NOTE — Progress Notes (Signed)
Primary Nurse Roderic Scarce, RN and Vicenta Dunning., RN performed a dual skin assessment on this patient Impairment noted- see wound doc flow sheet  Braden score is 18    Scattered abrasions r/t fall PTA and back surgeries

## 2016-08-04 NOTE — Progress Notes (Signed)
Neurosurgery Progress Note  Carl Howell, [place]  [phone]        Admit Date: 07/30/2016   LOS: 5 days        Daily Progress Note: 08/04/2016    HPI: The patient fell down the stairs while having a seizure. He has a history of epilepsy after a [facility] with a second resection 3 years ago. He is on multiple AEDs. His head CT in the ER revealed a subdural hematoma. The patient does not remember falling down the stairs. He began complaining of terrible back pain over the weekend. An x-ray revealed an age-indeterminate compression fracture. He was unable to lie flat for an MRI, so a thoracic CT scan was obtained, which revealed an unstable T5 burst fracture. The patient has been sedated on Precedex as patient was climbing out of the bed and the patient trying to get up and leave. When asked if he has numbness and tingling or pain going down his extremities he denies it. 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 He slept well. This morning still asking to leave the hospital, yet falls back asleep when talking to him. PCA discontinued this morning. Pt states his pain is not controlled and he hurts between his shoulder blades yet he is sleeping when no one is in the room.    Objective:     Vital signs  Temp (24hrs), Avg:97.7 ??F (36.5 ??C), Min:97.6 ??F (36.4 ??C), Max:97.8 ??F (36.6 ??C)   09/21 0701 - 09/21 1900  In: 133.1 [I.V.:133.1]  Out: -   09/19 1901 - 09/21 0700  In: 4113 [P.O.:480; I.V.:3633]  Out: 4865 [Urine:4400; Drains:465]    Visit Vitals   ??? BP 104/60   ??? Pulse 88   ??? Temp 97.8 ??F (36.6 ??C)   ??? Resp 16   ??? Ht 5' 10.98" (1.803 m)   ??? Wt 67.8 kg (149 lb 7.6 oz)   ??? SpO2 99%   ??? BMI 20.86 kg/m2      O2 Device: Room air     Pain control  Pain Assessment  Pain Scale 1: Numeric (0 - 10)   Pain Intensity 1: 8  Pain Onset 1: postop  Pain Location 1: Back  Pain Orientation 1: Posterior  Pain Description 1: Aching  Pain Intervention(s) 1: Medication (see MAR)    PT/OT  Gait     Gait  Base of Support: Narrowed, Shift to right  Speed/Cadence: Slow, Shuffled  Step Length: Left shortened  Gait Abnormalities: Ataxic, Decreased step clearance, Trunk sway increased  Ambulation - Level of Assistance: Moderate assistance  Distance (ft): 90 Feet (ft)  Assistive Device: Gait belt, Walker, rolling           Physical Exam:  Gen:NAD. Precedex 0.8 mcg/kg/hr infusing. Not turned off for exam.  Neuro: Lethargic. Ox3.  Follows commands. Speech clear. Affect flat.  PERRL.  Strength RUE/RLE 5/5, LUE 3/5 proximally 5/5 in hand. LLE 4/5.  Gait deferred.  Dressing with dried drainage. Incision with steri-strips and well-approximated. No swelling or erythema.    CT thoracic spine without contrast on 08/03/16 shows anatomic alignment following thoracic fusion from T3 through T7.    24 hour results:    Recent Results (from the past 24 hour(s))  CBC WITH AUTOMATED DIFF    Collection Time: 08/03/16 12:46 PM   Result Value Ref Range    WBC 7.7 4.1 - 11.1 K/uL    RBC 3.48 (L) 4.10 - 5.70 M/uL    HGB 10.6 (L) 12.1 - 17.0 g/dL    HCT 09.8 (L) 11.9 - 50.3 %    MCV 92.8 80.0 - 99.0 FL    MCH 30.5 26.0 - 34.0 PG    MCHC 32.8 30.0 - 36.5 g/dL    RDW 14.7 82.9 - 56.2 %    PLATELET 275 150 - 400 K/uL    NEUTROPHILS 71 32 - 75 %    LYMPHOCYTES 17 12 - 49 %    MONOCYTES 11 5 - 13 %    EOSINOPHILS 1 0 - 7 %    BASOPHILS 0 0 - 1 %    ABS. NEUTROPHILS 5.5 1.8 - 8.0 K/UL    ABS. LYMPHOCYTES 1.3 0.8 - 3.5 K/UL    ABS. MONOCYTES 0.8 0.0 - 1.0 K/UL    ABS. EOSINOPHILS 0.1 0.0 - 0.4 K/UL    ABS. BASOPHILS 0.0 0.0 - 0.1 K/UL   METABOLIC PANEL, BASIC    Collection Time: 08/03/16 12:46 PM   Result Value Ref Range    Sodium 137 136 - 145 mmol/L    Potassium 3.8 3.5 - 5.1 mmol/L    Chloride 103 97 - 108 mmol/L    CO2 26 21 - 32 mmol/L     Anion gap 8 5 - 15 mmol/L    Glucose 99 65 - 100 mg/dL    BUN 6 6 - 20 MG/DL    Creatinine 1.30 8.65 - 1.30 MG/DL    BUN/Creatinine ratio 8 (L) 12 - 20      GFR est AA >60 >60 ml/min/1.56m2    GFR est non-AA >60 >60 ml/min/1.45m2    Calcium 8.3 (L) 8.5 - 10.1 MG/DL          Assessment:     Principal Problem:    Subdural hematoma (HCC) (07/30/2016)    Active Problems:    Epilepsy (HCC) ()      Seizures (HCC) (07/30/2016)      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 Subdural hematoma, traumatic right parietal   - CT scan stable   - Will need repeat head CT in 1-2 weeks to eval  2. Unstable T5 burst fracture, traumatic   - s/p T3-T7 fusion and T5 laminectomy   - ok to get OOB. Does not need a brace   - Precedex to help with agitation   - Pain control -  Oxycontin 10 mg bid, Percocet PRN, add Flexeril PRN   - PT/OT  3. Epilepsy   - Neurology following   - Vimpat dose increased. Keppra dose from home continued  4. Hx grade III oligoastrocytoma   - CT scan stable  5. Tobacco abuse   - Nicotine patch  6. Depression   - Cont effexor and zoloft from home   - Hospitalist following  7. Agitation   - Add Trazodone at bedtime   - Ativan, Fentanyl, Precedex, Haldol PRN   - Wean precedex and can transfer to [facility] when off Precedex    Activity: up with assist  DVT ppx: SCDs  Dispo: tbd    Plan d/w Dr. Lyn Hollingshead. HVAC removed. Dressing changed. Transfer  to [facility] when off Precedex. Pt likely will not be amenable to rehab.      Brent General, NP

## 2016-08-04 NOTE — Progress Notes (Signed)
Attended interdisciplinary rounds in ICU.   Chaplain: Rev. Rachel K. Cobb, MDiv; BCC; to contact Spirit[facility] Services call: [phone]

## 2016-08-04 NOTE — Other (Signed)
TRANSFER - OUT REPORT:    Verbal report given to Jessi, RN on [facility]  being transferred to [facility] for routine progression of care       Report consisted of patient???s Situation, Background, Assessment and   Recommendations(SBAR).     Information from the following report(s) SBAR was reviewed with the receiving nurse.    Lines:       Opportunity for questions and clarification was provided.      Patient transported with:   Registered nurse and tech

## 2016-08-05 ENCOUNTER — Inpatient Hospital Stay: Admit: 2016-08-05 | Payer: PRIVATE HEALTH INSURANCE | Primary: Family Medicine

## 2016-08-05 MED ORDER — LORAZEPAM 1 MG TAB
1 mg | Freq: Two times a day (BID) | ORAL | Status: DC | PRN
Start: 2016-08-05 — End: 2016-08-08
  Administered 2016-08-06 – 2016-08-08 (×2): via ORAL

## 2016-08-05 MED ORDER — FLU VACCINE QV 2017-18 (36 MOS+)(PF) 60 MCG (15 MCG X 4)/0.5 ML IM SYRINGE
60 mcg (15 mcg x 4)/0.5 mL | INTRAMUSCULAR | Status: AC
Start: 2016-08-05 — End: 2016-08-08
  Administered 2016-08-08: 20:00:00 via INTRAMUSCULAR

## 2016-08-05 MED FILL — VENLAFAXINE SR 150 MG 24 HR CAP: 150 mg | ORAL | Qty: 1

## 2016-08-05 MED FILL — FAMOTIDINE 20 MG TAB: 20 mg | ORAL | Qty: 1

## 2016-08-05 MED FILL — LORAZEPAM 2 MG/ML IJ SOLN: 2 mg/mL | INTRAMUSCULAR | Qty: 1

## 2016-08-05 MED FILL — LORAZEPAM 1 MG TAB: 1 mg | ORAL | Qty: 1

## 2016-08-05 MED FILL — NORMAL SALINE FLUSH 0.9 % INJECTION SYRINGE: INTRAMUSCULAR | Qty: 20

## 2016-08-05 MED FILL — DOK PLUS 8.6 MG-50 MG TABLET: ORAL | Qty: 1

## 2016-08-05 MED FILL — NORMAL SALINE FLUSH 0.9 % INJECTION SYRINGE: INTRAMUSCULAR | Qty: 10

## 2016-08-05 MED FILL — VIMPAT 50 MG TABLET: 50 mg | ORAL | Qty: 4

## 2016-08-05 MED FILL — CYCLOBENZAPRINE 10 MG TAB: 10 mg | ORAL | Qty: 1

## 2016-08-05 MED FILL — FENTANYL CITRATE (PF) 50 MCG/ML IJ SOLN: 50 mcg/mL | INTRAMUSCULAR | Qty: 2

## 2016-08-05 MED FILL — OXYCODONE-ACETAMINOPHEN 10 MG-325 MG TAB: 10-325 mg | ORAL | Qty: 1

## 2016-08-05 MED FILL — TRAZODONE 50 MG TAB: 50 mg | ORAL | Qty: 1

## 2016-08-05 MED FILL — OXYCODONE ER 10 MG TABLET,CRUSH RESISTANT,EXTENDED RELEASE 12 HR: 10 mg | ORAL | Qty: 1

## 2016-08-05 MED FILL — TYLENOL 325 MG TABLET: 325 mg | ORAL | Qty: 2

## 2016-08-05 MED FILL — LEVETIRACETAM 250 MG TAB: 250 mg | ORAL | Qty: 1

## 2016-08-05 MED FILL — NICOTINE 21 MG/24 HR DAILY PATCH: 21 mg/24 hr | TRANSDERMAL | Qty: 1

## 2016-08-05 NOTE — Progress Notes (Signed)
Problem: Falls - Risk of  Goal: *Absence of Falls  Document Schmid Fall Risk and appropriate interventions in the flowsheet.   Outcome: Progressing Towards Goal  Fall Risk Interventions:  Mobility Interventions: Assess mobility with egress test, Communicate number of staff needed for ambulation/transfer, OT consult for ADLs, Patient to call before getting OOB, PT Consult for mobility concerns, PT Consult for assist device competence, Strengthening exercises (ROM-active/passive)     Mentation Interventions: Adequate sleep, hydration, pain control, Bed/chair exit alarm, Door open when patient unattended, Evaluate medications/consider consulting pharmacy, Increase mobility, More frequent rounding, Reorient patient     Medication Interventions: Assess postural VS orthostatic hypotension, Bed/chair exit alarm, Evaluate medications/consider consulting pharmacy, Patient to call before getting OOB, Teach patient to arise slowly, Utilize gait belt for transfers/ambulation     Elimination Interventions: Call light in reach, Bed/chair exit alarm, Patient to call for help with toileting needs, Toilet paper/wipes in reach, Toileting schedule/hourly rounds, Urinal in reach     History of Falls Interventions: Bed/chair exit alarm, Door open when patient unattended, Consult care management for discharge planning, Evaluate medications/consider consulting pharmacy, Investigate reason for fall, Room close to nurse's station

## 2016-08-05 NOTE — Progress Notes (Signed)
Problem: Self Care Deficits Care Plan (Adult)  Goal: *Acute Goals and Plan of Care (Insert Text)  Occupational Therapy Goals  Initiated 08/05/2016  1. Patient will compete grooming standing with CG within 7 days.  2. Patient will complete bathing with CG within 7 days.  3. Patient will complete upper body dressing with CG within 7 days.  4. Patient will complete min A for lower body dressing with CG within 7 days.  5. Patient will complete toilet transfer with min A within 7 days.   6. Patient will complete toietling with min A within 7 days.   OCCUPATIONAL THERAPY EVALUATION  Patient: Carl Howell [age] y.o. male)  Date: 08/05/2016  Primary Diagnosis: subdural bleed  Subdural hematoma (HCC)  UNKNOWN  Procedure(s) (LRB):  T5 LAMINECTOMY WITH POSTERIOR INSTRUMENTATION AND FUSION AT T3-8 (N/A) 3 Days Post-Op   Precautions:   Fall, Back      ASSESSMENT :  Based on the objective data described below, the patient presents with decreased independence with self care and functional mobility following admission for seizure with fall down a 2nd story home.  Medical work up including MRI and finding T5 fracture and underwent T3-8 fusion.  Pt currently completing functional transfers with mod A and noted with posterior lean and ataxic gait patterns.  Pt continues with left hemiparesis following seziure which has not resolved at this time.  Pt denies any hemiparesis prior to admission.  Pt requires mod A for lower body dressing, min A for feeding and grooming due to left sided weakness, and mod A for toilet transfers.  Pt unsafe to discharge at home at this time as he continues to be a high fall risk.  Recommend discharge to in-patient rehab setting.      Patient will benefit from skilled intervention to address the above impairments.  Patient???s rehabilitation potential is considered to be Good  Factors which may influence rehabilitation potential include:   [X]              None noted  [ ]              Mental ability/status   [ ]              Medical condition  [ ]              Home/family situation and support systems  [ ]              Safety awareness  [ ]              Pain tolerance/management  [ ]              Other:        PLAN :  Recommendations and Planned Interventions:  [X]                Self Care Training                  [X]         Therapeutic Activities  [X]                Functional Mobility Training    [ ]         Cognitive Retraining  [X]                Therapeutic Exercises           [X]         Endurance Activities  [X]                Balance  Training                   [X]         Neuromuscular Re-Education  [ ]                Visual/Perceptual Training     [X]    Home Safety Training  [X]                Patient Education                 [X]         Family Training/Education  [ ]                Other (comment):     Frequency/Duration: Patient will be followed by occupational therapy 5 times a week to address goals.  Discharge Recommendations: Inpatient Rehab  Further Equipment Recommendations for Discharge: TBD       SUBJECTIVE:   Patient stated ???I will do what you ask.???      OBJECTIVE DATA SUMMARY:   HISTORY:   Past Medical History:   Diagnosis Date   ??? Cancer of brain (HCC)     ??? Epilepsy (HCC) unknown     Past Surgical History:   Procedure Laterality Date   ??? BRAIN AVM SURG,DURAL,COMPLX         brain surgery - unknown had brain tumor   ??? KIDNEY DIALYSATE DELIVRY SYS         kidney surgery - unknown   ??? NEUROLOGICAL PROCEDURE UNLISTED         craniotomy        Prior Level of Function/Home Situation: pt reports he was independent prior to admission.   Expanded or extensive additional review of patient history:      Home Situation  Home Environment: Private residence  # Steps to Enter: 3  Rails to Enter: No  One/Two Story Residence: Two Engineer, materials Steps: 13  Living Alone: No  Support Systems: Family member(s)  Patient Expects to be Discharged to:: Rehabilitation facility  Current DME Used/Available at Home: Cane, straight   [X]   Right hand dominant             [ ]   Left hand dominant     EXAMINATION OF PERFORMANCE DEFICITS:  Cognitive/Behavioral Status:  Neurologic State: Alert  Orientation Level: Oriented X4  Cognition: Appropriate safety awareness  Perception: Appears intact  Perseveration: No perseveration noted  Safety/Judgement: Decreased awareness of need for safety;Decreased awareness of need for assistance     Skin: see nursing notes     Edema: noted     Hearing:  Auditory  Auditory Impairment: None     Vision/Perceptual:                           Acuity: Within Defined Limits          Range of Motion:  LUE hemiparesis following seizure.   AROM: Grossly decreased, non-functional (LUE is grossly decreased)  PROM: Grossly decreased, non-functional (LUE is grossly decreased)                       Strength:     Strength: Grossly decreased, non-functional (LUE is grossly decreased)                 Coordination:  Coordination: Grossly decreased, non-functional (LUE is grossly decreased)  Fine Motor Skills-Upper: Right Intact;Left Impaired  Gross Motor Skills-Upper: Right Intact;Left Impaired     Tone & Sensation:     Tone: Abnormal  Sensation: Intact                       Balance:  Sitting: Intact  Sitting - Static: Fair (occasional)  Sitting - Dynamic: Fair (occasional)  Standing: Impaired  Standing - Static: Poor  Standing - Dynamic : Poor     Functional Mobility and Transfers for ADLs:  Bed Mobility:  Supine to Sit:  (recieved in chair)  Sit to Supine:  (remained in chair)     Transfers:  Sit to Stand: Moderate assistance  Stand to Sit: Moderate assistance  Bed to Chair: Moderate assistance  Toilet Transfer : Moderate assistance (simulated with sit to stand transfers)     ADL Assessment:  Feeding: Minimum assistance (needs A to cut food)     Oral Facial Hygiene/Grooming: Minimum assistance (needs A to open items for oral care)     Bathing: Moderate assistance (due to LUE hemiparesis and decreased standing balance)      Upper Body Dressing: Minimum assistance     Lower Body Dressing: Maximum assistance     Toileting: Moderate assistance                 Cognitive Retraining  Safety/Judgement: Decreased awareness of need for safety;Decreased awareness of need for assistance     Functional Measure:  Barthel Index:      Bathing: 0  Bladder: 10  Bowels: 10  Grooming: 0  Dressing: 5  Feeding: 5  Mobility: 5  Stairs: 0  Toilet Use: 5  Transfer (Bed to Chair and Back): 5  Total: 45         Barthel and G-code impairment scale:  Percentage of impairment CH  0% CI  1-19% CJ  20-39% CK  40-59% CL  60-79% CM  80-99% CN  100%   Barthel Score 0-100 100 99-80 79-60 59-40 20-39 1-19    0   Barthel Score 0-20 20 17-19 13-16 9-12 5-8 1-4 0      The Barthel ADL Index: Guidelines  1. The index should be used as a record of what a patient does, not as a record of what a patient could do.  2. The main aim is to establish degree of independence from any help, physical or verbal, however minor and for whatever reason.  3. The need for supervision renders the patient not independent.  4. A patient's performance should be established using the best available evidence. Asking the patient, friends/relatives and nurses are the usual sources, but direct observation and common sense are also important. However direct testing is not needed.  5. Usually the patient's performance over the preceding 24-48 hours is important, but occasionally longer periods will be relevant.  6. Middle categories imply that the patient supplies over 50 per cent of the effort.  7. Use of aids to be independent is allowed.     Clarisa KindredMahoney, F.l., Barthel, D.W. [phone](1965). Functional evaluation: the Barthel Index. Md State Med J (14)2.  Zenaida NieceVan der Emerald BeachPutten, J.J.M.F, [place], Ian MalkinJ.C., Margret ChanceFreeman, J.A., [place], [place].J. (1999). Measuring the change indisability after inpatient rehabilitation; comparison of the responsiveness of the Barthel Index and Functional  Independence Measure. Journal of Neurology, Neurosurgery, and Psychiatry, 66(4), [phone].  Dawson BillsVan Exel, N.J.A, [place],  W.J.M, & Koopmanschap, M.A. (2004.) Assessment of post-stroke quality of life in cost-effectiveness studies: The usefulness of the Barthel Index and  the EuroQoL-5D. Quality of Life Research, 13, 161-09         G codes:  In compliance with CMS???s Claims Based Outcome Reporting, the following G-code set was chosen for this patient based on their primary functional limitation being treated:     The outcome measure chosen to determine the severity of the functional limitation was the Barthel Index with a score of 45/100 which was correlated with the impairment scale.      ?? Self Care:              [phone] - CURRENT STATUS:    CL - 60%-79% impaired, limited or restricted              [id number] - GOAL STATUS:           CJ - 20%-39% impaired, limited or restricted              B1478 - D/C STATUS:                       ---------------To be determined---------------      Occupational Therapy Evaluation Charge Determination   History Examination Decision-Making   LOW Complexity : Brief history review  HIGH Complexity : 5 or more performance deficits relating to physical, cognitive , or psychosocial skils that result in activity limitations and / or participation restrictions HIGH Complexity : Patient presents with comorbidities that affect occupational performance. Signifigant modification of tasks or assistance (eg, physical or verbal) with assessment (s) is necessary to enable patient to complete evaluation       Based on the above components, the patient evaluation is determined to be of the following complexity level: LOW   Pain:  Pain Scale 1: Numeric (0 - 10)  Pain Intensity 1: 6  Pain Location 1: Back  Pain Orientation 1: Posterior  Pain Description 1: Aching  Pain Intervention(s) 1: Medication (see MAR)  Activity Tolerance:   VSS throughout session.      After treatment:    [X]  Patient left in no apparent distress sitting up in chair  [ ]  Patient left in no apparent distress in bed  [X]  Call bell left within reach  [X]  Nursing notified  [ ]  Caregiver present  [ ]  Bed alarm activated      COMMUNICATION/EDUCATION:   The patient???s plan of care was discussed with: Physical Therapist and Registered Nurse.  [X]  Home safety education was provided and the patient/caregiver indicated understanding.  [X]  Patient/family have participated as able in goal setting and plan of care.  [ ]  Patient/family agree to work toward stated goals and plan of care.  [ ]  Patient understands intent and goals of therapy, but is neutral about his/her participation.  [ ]  Patient is unable to participate in goal setting and plan of care.  This patient???s plan of care is appropriate for delegation to OTA.     Thank you for this referral.  Santa Lighter, OT  Time Calculation: 15 mins

## 2016-08-05 NOTE — Progress Notes (Signed)
Problem: Mobility Impaired (Adult and Pediatric)  Goal: *Acute Goals and Plan of Care (Insert Text)  Physical Therapy Goals  Initiated 08/03/2016  1. Patient will move from supine to sit and sit to supine and scoot up and down in bed with supervision/set-up within 4 day(s).   2. Patient will transfer from bed to chair and chair to bed with supervision/set-up using the least restrictive device within 4 day(s).  3. Patient will perform sit to stand with supervision/set-up within 4 day(s).  4. Patient will ambulate with minimal assistance/contact guard assist for 150 feet with the least restrictive device within 4 day(s).   5. Patient will ascend/descend 3 stairs with no handrail(s) with minimal assistance/contact guard assist within 4 day(s).  6. Patient will improve Berg Balance score by 7 points within 7 days.   PHYSICAL THERAPY TREATMENT  Patient: Carl Howell ([age] y.o. male)  Date: 08/05/2016  Diagnosis: subdural bleed  Subdural hematoma (HCC)  UNKNOWN Subdural hematoma (HCC)  Procedure(s) (LRB):  T5 LAMINECTOMY WITH POSTERIOR INSTRUMENTATION AND FUSION AT T3-8 (N/A) 3 Days Post-Op  Precautions: Back      ASSESSMENT:  Cleared for mobilization by RN, received pt up in chair. Chart reviewed and recent events noted (fell OOB 08/04/16, repeat head and spine imaging negative). Pt continues to demonstrate little to no insight to his deficits, level of assist required, and safe discharge planning (will acknowledge deficits but does not translate that into functionality at home) and remains a HIGH FALLS RISK. Note improved L UE strength but ?inattention remains. He required mod/max A x2 with B HHA for gait training x6535ft with strong posterior and L lateral lean noted, ataxia, NBOS, knee buckle and poor proximal hip strength in stance phase, and frequent cues to advance L LE required. Completed depression lifts from chair with manual approximation at L UE to facilitate weight bearing and  positioning. In stance, performed mini squats x10 reps with cues for form and even weight distrubution; seated L LE LAQs with 2-3 second holds for strengthening. Note L LE tremor with muscular fatigue, ceases with manual axial approximation (reports this is nothing new and occurred at baseline). Continue to STRONGLY recommend discharge to IP acute rehab prior to returning home. Pt remained up in chair with chair alarm on and call light in reach at end of session.      Progression toward goals:  [ ]     Improving appropriately and progressing toward goals  [X]     Improving slowly and progressing toward goals  [ ]     Not making progress toward goals and plan of care will be adjusted       PLAN:  Patient continues to benefit from skilled intervention to address the above impairments.  Continue treatment per established plan of care.  Discharge Recommendations:  Inpatient Rehab  Further Equipment Recommendations for Discharge:  TBD       SUBJECTIVE:   Patient stated ???Which are good rehab places?.???      OBJECTIVE DATA SUMMARY:   Critical Behavior:  Neurologic State: Eyes open spontaneously, Drowsy  Orientation Level: Oriented X4  Cognition: Impulsive, Decreased attention/concentration, Poor safety awareness  Safety/Judgement: Decreased awareness of environment, Decreased awareness of need for assistance, Decreased awareness of need for safety, Decreased insight into deficits  Functional Mobility Training:     Transfers:  Sit to Stand: Moderate assistance  Stand to Sit: Moderate assistance      Balance:  Sitting: Intact  Sitting - Static: Fair (occasional)  Sitting - Dynamic:  Fair (occasional)  Standing: Impaired;With support  Standing - Static: Poor;Fair;Constant support  Standing - Dynamic : Poor  Ambulation/Gait Training:  Distance (ft): 35 Feet (ft)  Assistive Device: Gait belt (B HHA)  Ambulation - Level of Assistance: Moderate assistance;Maximum assistance;Assist x2   Gait Abnormalities: Ataxic;Decreased step clearance;Path deviations;Trunk sway increased   Base of Support: Narrowed  Speed/Cadence: Pace decreased (<100 feet/min);Shuffled;Slow  Step Length: Right shortened;Left shortened                    Pain:  Pain Scale 1: Numeric (0 - 10)  Pain Intensity 1: 6  Pain Location 1: Back  Pain Orientation 1: Posterior  Pain Description 1: Aching  Pain Intervention(s) 1: Medication (see MAR)  Activity Tolerance:   VSS  Please refer to the flowsheet for vital signs taken during this treatment.  After treatment:   [X]     Patient left in no apparent distress sitting up in chair  [ ]     Patient left in no apparent distress in bed  [X]     Call bell left within reach  [X]     Nursing notified  [ ]     Caregiver present  [X]     Chair alarm activated      COMMUNICATION/COLLABORATION:   The patient???s plan of care was discussed with: Registered Nurse     [name], PT, DPT   Time Calculation: 20 mins

## 2016-08-05 NOTE — Other (Signed)
IDR/SLIDR Summary          Patient: Carl Howell MRN: [id number]    Age: 40 y.o.     Birthdate: April 14, 1976 Room/Bed: 662/01   Admit Diagnosis: subdural bleed  Subdural hematoma (HCC)  UNKNOWN  Principal Diagnosis: Subdural hematoma (HCC)   Goals: Safe ambulation, pain management  Readmission: NO  Quality Measure: SCIP  VTE Prophylaxis: Mechanical  Influenza Vaccine screening completed? YES  Pneumococcal Vaccine screening completed? NO  Mobility needs: Yes, No   Nutrition plan:Yes  Consults:P.T, O.T. and Case Management    Financial concerns:Yes  Escalated to CM? YES  RRAT Score: 3   Interventions:Home Health  Testing due for pt today? NO  LOS: 6 days Expected length of stay 6-7 days  Discharge plan: Pending   PCP: Tommi Rumps, MD  Transportation needs: Yes    Days before discharge:one day until discharge   Discharge disposition: Rehab v. Home    Signed:     Elpidio Galea  08/05/2016  8:59 AM

## 2016-08-05 NOTE — Progress Notes (Signed)
Neurosurgery Progress Note  Carl Howell, [place]  [phone]        Admit Date: 07/30/2016   LOS: 6 days        Daily Progress Note: 08/05/2016    HPI: The patient fell down the stairs while having a seizure. 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 He is on multiple AEDs. His head CT in the ER revealed a subdural hematoma. The patient does not remember falling down the stairs. He began complaining of terrible back pain over the weekend. An x-ray revealed an age-indeterminate compression fracture. He was unable to lie flat for an MRI, so a thoracic CT scan was obtained, which revealed an unstable T5 burst fracture. The patient has been sedated on Precedex as patient was climbing out of the bed and the patient trying to get up and leave. When asked if he has numbness and tingling or pain going down his extremities he denies it. Later he says he has some left arm numbness and weakness, which is typical with his Carl Howell from his seizures    POD3 s/p T3-7 fusion and T5 laminectomy    Subjective:   The patient had a fall last night. He tried to get up out of bed on his own. He now realizes his left leg is weaker than it was and he needs rehab. A repeat head CT and T-spine CT showed no changes after his fall. He now has a Comptrollersitter and his mom is at the bedside. He does complain of back pain.    Objective:     Vital signs  Temp (24hrs), Avg:97.9 ??F (36.6 ??C), Min:97.4 ??F (36.3 ??C), Max:98.3 ??F (36.8 ??C)      09/20 1901 - 09/22 0700  In: 1498.1 [P.O.:480; I.V.:1018.1]  Out: 3860 [Urine:3800; Drains:60]    Visit Vitals   ??? BP 136/82 (BP 1 Location: Left arm, BP Patient Position: Sitting)   ??? Pulse 87   ??? Temp 98.1 ??F (36.7 ??C)   ??? Resp 16   ??? Ht 5' 10.98" (1.803 m)   ??? Wt 63.1 kg (139 lb 1.8 oz)   ??? SpO2 97%   ??? BMI 19.41 kg/m2      O2 Device: Room air     Pain control  Pain Assessment  Pain Scale 1: Numeric (0 - 10)   Pain Intensity 1: 6  Pain Onset 1: post op  Pain Location 1: Back  Pain Orientation 1: Posterior  Pain Description 1: Aching  Pain Intervention(s) 1: Medication (see MAR)    PT/OT  Gait     Gait  Base of Support: Narrowed  Speed/Cadence: Pace decreased (<100 feet/min), Shuffled, Slow  Step Length: Right shortened, Left shortened  Gait Abnormalities: Ataxic, Decreased step clearance, Path deviations, Trunk sway increased  Ambulation - Level of Assistance: Moderate assistance, Maximum assistance, Assist x2  Distance (ft): 35 Feet (ft)  Assistive Device: Gait belt (B HHA)           Physical Exam:  Gen:NAD.   Neuro: Alert Ox3.  Follows commands. Speech clear. Affect flat.  PERRL.  Strength RUE/RLE 5/5, LUE 3/5 proximally 5/5 in hand. LLE 4/5.  Gait deferred.  Dressing with dried drainage. Incision with steri-strips and well-approximated. No swelling or erythema.    CT thoracic spine without contrast on 08/05/16 postoperative changes from T3 through T7 appear stable. Unchanged T5 compression fracture. No new fracture is  evident.    CT head without contrast on 08/05/16 shows previously seen subdural hematoma is stable in size. No new intracranial hemorrhage.    24 hour results:    No results found for this or any previous visit (from the past 24 hour(s)).       Assessment:     Principal Problem:    Subdural hematoma (HCC) (07/30/2016)    Active Problems:    Epilepsy (HCC) ()      Seizures (HCC) (07/30/2016)      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 Subdural hematoma, traumatic right parietal   - CT scan stable   - Will need repeat head CT in 2 weeks to eval  2. Unstable T5 burst fracture, traumatic   - s/p T3-T7 fusion and T5 laminectomy   - ok to get OOB. Does not need a brace   - Pain control -  Oxycontin 10 mg bid, Percocet PRN, add Flexeril PRN. When he goes to rehab would only send him on Percocet and Flexeril PRN   - PT/OT    - OK to go to rehab from NSGY standpoint when insurance auth obtained.  3. Epilepsy   - Neurology following   - Vimpat dose increased. Keppra dose from home continued  4. Hx grade III oligoastrocytoma   - CT scan stable  5. Tobacco abuse   - Nicotine patch  6. Depression   - Cont effexor and zoloft from home   - Hospitalist following  7. Agitation   - Add Trazodone at bedtime   - Ativan, Fentanyl, Precedex, Haldol PRN   - Improved    Activity: up with assist  DVT ppx: SCDs  Dispo: rehab    Plan d/w Dr. Lyn HollingsheadAlexander, mother at bedside, case management      Brent GeneralJaime M Markham, NP

## 2016-08-05 NOTE — Progress Notes (Signed)
Post Fall Documentation      Carl Howell witnessed/unwitnessed fall occurred on 08/05/16   (Date) at 2315 (Time).     The answers to the following questions summarize the fall:     ?? In the patient's own words,:  ?? What was he/she doing when he/she fell? Trying to use the urinal    ?? What are his/her complaints? none    ?? Nurse:  ?? Document observation, treatment, conversation, follow-up, and patient response. Found pt on floor on his bottom by the bed, pt helped up to his feet, assisted in using urinal, returned safely to bed, VSS. MD notified    ?? What was the patient's condition when found (i.e., pain, symptoms, cuts, bruises)? No complaints    ?? What specific complaints did the patient have? none     ?? What did the staff do when patient was found (i.e., vital signs, returned to bed with fall alarm, side rails up)? Helped pt void into urinal, checked VS, inspected pt's bottom where he fell, returned to bed safely with bed alarm on    ?? Which physician was notified? Dr. Vaughan Basta, hospitalist    [name]

## 2016-08-05 NOTE — Other (Deleted)
To accurately capture the SOI & ROM please clarify if this patient is being treated/managed for:    =>Metabolic Encephalopathy in the setting of Agitation, restlessness, belligerent to staff requiring increased Safely measures ie room close to nurses station, bed alarm, and Ativan IV .    =>Other Explanation of clinical findings  =>Unable to Determine (no explanation of clinical findings)    The medical record reflects the following clinical findings, treatment, and risk factors:    Risk Factors: Traumatic Rt Parietal SDH     Clinical Indicators: Agitation, restlessness, belligerent to staff     Treatment: increased Safely measures i.e. room close to nurses station, bed alarm, and Ativan IV .    Please clarify and document your clinical opinion in the progress notes and discharge summary including the definitive and/or presumptive diagnosis, (suspected or probable), related to the above clinical findings. Please include clinical findings supporting your diagnosis.    Thank you  Kathrin Penner   CDMP  [phone]

## 2016-08-05 NOTE — Progress Notes (Addendum)
CM noted that PT, OT, and physician have recommended inpatient rehab. CM met with patient and his mother, and patient agreed with the recommendation. CM offered choice of providers. Patient selected [facility] and signed Freedom of Choice form which CM placed on chart. CM sent referral via [employer]. CM spoke with Charlyne Quale, [place], who conducted bedside assessment this afternoon. Final decision regarding admission is pending. If accepted, patient will then need insurance authorization.

## 2016-08-05 NOTE — Progress Notes (Signed)
Problem: Falls - Risk of  Goal: *Absence of Falls  Document Schmid Fall Risk and appropriate interventions in the flowsheet.   Outcome: Not Progressing Towards Goal  Fall Risk Interventions:  Mobility Interventions: Assess mobility with egress test, Mechanical lift, PT Consult for assist device competence, Utilize gait belt for transfers/ambulation, Strengthening exercises (ROM-active/passive), PT Consult for mobility concerns, Patient to call before getting OOB, OT consult for ADLs, Communicate number of staff needed for ambulation/transfer, Bed/chair exit alarm, Utilize walker, cane, or other assitive device     Mentation Interventions: Adequate sleep, hydration, pain control, Bed/chair exit alarm, Door open when patient unattended, Evaluate medications/consider consulting pharmacy, Familiar objects from home, Family/sitter at bedside, Gait belt with transfers/ambulation     Medication Interventions: Patient to call before getting OOB, Utilize gait belt for transfers/ambulation, Teach patient to arise slowly, Bed/chair exit alarm, Assess postural VS orthostatic hypotension, Evaluate medications/consider consulting pharmacy     Elimination Interventions: Bed/chair exit alarm, Elevated toilet seat, Toilet paper/wipes in reach, Toileting schedule/hourly rounds, Urinal in reach, Patient to call for help with toileting needs, Call light in reach     History of Falls Interventions: Bed/chair exit alarm, Evaluate medications/consider consulting pharmacy, Room close to nurse's station, Utilize gait belt for transfer/ambulation, Investigate reason for fall, Consult care management for discharge planning, Door open when patient unattended           Comments:   Pt continues to be impulsive and get out of bed without help. Pt sustained another fall tonight at 2315. Pt educated on use of call bell; pt verbalizes understanding that he should not get out of bed alone.

## 2016-08-05 NOTE — Progress Notes (Signed)
Physical Therapy    Reviewed chart. Attempted to treat pt. Pt declined reporting that he is feeling sore (back) and that he is planning on rehab. Pt expressed left LE is weak and not supporting him. Pt not open to encouragement. Deferred visit and will continue to follow.

## 2016-08-05 NOTE — Progress Notes (Signed)
Hospitalist Progress Note  Cherlynn Kaisernan W Bomar IV, MD  Office: [phone]        Date of Service:  08/05/2016  NAME:  Carl Howell  DOB:  [birth year]  MRN:  [id number]      Admission Summary:   40 yo male with PMhx of Rt Parietal occipital  Oligoastrocytoma s/p resection 2008, s/p chemo, seizure d/o, admitted for fall and witnessed seizures. [facility] showing SDH.    Interval history / Subjective:   Pt c/o pain in his back with movement of any kind. He did have nausea c/o vomiting but this has improved throughout the day and he has tolerated meals lately. He denies any chest pain, SOB, N/V, D/C, F/C     Assessment & Plan:     40yo male with a right parietal subdural hematoma, breakthrough seizure from epilepsy, and vertebral FXR of 5th thoracic vertebrae s/p T5 laminectomy with post fusion at T3-T7.    PROBLEM LIST:   1. Right parietal subdural hematoma  2. Vertebral FXR of 5th thoracic vertebrae  3. Epilepsy with breakthrough seizure  4. Oligoastrocytoma  5. Depression  6. Chronic tobacco/nicotine usage    PLAN: Continue current Neurology and Neurosurgery recommendations. Continue pain mgmt per Neurosurgery. Continue to observe subdural hematoma. Continue Neurology recommendations regarding seizure. Continue Vimpat and Keppra. Pt has been counseled on the importance of smoking/tobacco cessation. Continue nicotine patch.     Code status: FULL CODE  DVT prophylaxis: SCD hose    Care Plan discussed with: Patient/Family  Disposition: SAH/Rehab and TBD     Hospital Problems  Date Reviewed: 07/31/2016          Codes Class Noted POA    Closed unstable burst fracture of fifth thoracic vertebra (HCC) ICD-10-CM: W29.562ZS22.052A  ICD-9-CM: 805.2  08/01/2016 Yes        Glioma (HCC) ICD-10-CM: C71.9  ICD-9-CM: 191.1  07/31/2016 Yes        Seizures (HCC) ICD-10-CM: R56.9  ICD-9-CM: 780.39  07/30/2016 Yes         * (Principal)Subdural hematoma (HCC) ICD-10-CM: I62.00  ICD-9-CM: 432.1  07/30/2016 Unknown        Epilepsy (HCC) ICD-10-CM: G40.909  ICD-9-CM: 345.90  Unknown Yes                Review of Systems:   A comprehensive review of systems was negative except for that written in the HPI.       Vital Signs:    Last 24hrs VS reviewed since prior progress note. Most recent are:  Visit Vitals   ??? BP 142/84 (BP 1 Location: Left arm, BP Patient Position: Head of bed elevated (Comment degrees);At rest)   ??? Pulse 87   ??? Temp 97.4 ??F (36.3 ??C)   ??? Resp 18   ??? Ht 5' 10.98" (1.803 m)   ??? Wt 63.1 kg (139 lb 1.8 oz)   ??? SpO2 95%   ??? BMI 19.41 kg/m2         Intake/Output Summary (Last 24 hours) at 08/05/16 1856  Last data filed at 08/05/16 1430   Gross per 24 hour   Intake                0 ml   Output             3000 ml   Net            -3000 ml        Physical  Examination:             Constitutional:  male, lying in bed, No acute distress, cooperative, pleasant??   ENT:  Oral mucous moist, oropharynx benign. Neck supple,    Resp:  CTA bilaterally. No wheezing/rhonchi/rales. No accessory muscle use   CV:  Regular rhythm, normal rate, no murmurs, gallops, rubs    GI:  Soft, non distended, non tender. normoactive bowel sounds, no hepatosplenomegaly     Musculoskeletal:  No edema, warm, 2+ pulses throughout    Neurologic:  Moves all extremities.  AAOx3, CN II-XII reviewed     Eyes:  EOMI. Anicteric sclerae, PERRL. and conjunctiva pink and moist       Data Review:    Review and/or order of clinical lab test      Labs:     Recent Labs      08/03/16   1246  08/03/16   0913   WBC  7.7  5.5   HGB  10.6*  6.0*   HCT  32.3*  18.5*   PLT  275  137*     Recent Labs      08/03/16   1246  08/03/16   0913   NA  137  142   K  3.8  6.2*   CL  103  113*   CO2  26  25   BUN  6  5*   CREA  0.71  0.49*   GLU  99  82   CA  8.3*  7.2*     No results for input(s): SGOT, GPT, ALT, AP, TBIL, TBILI, TP, ALB, GLOB, GGT, AML, LPSE in the last 72 hours.     No lab exists for component: AMYP, HLPSE  No results for input(s): INR, PTP, APTT in the last 72 hours.    No lab exists for component: INREXT   No results for input(s): FE, TIBC, PSAT, FERR in the last 72 hours.   No results found for: FOL, RBCF   No results for input(s): PH, PCO2, PO2 in the last 72 hours.  No results for input(s): CPK, CKNDX, TROIQ in the last 72 hours.    No lab exists for component: CPKMB  No results found for: CHOL, CHOLX, CHLST, CHOLV, HDL, LDL, LDLC, DLDLP, TGLX, TRIGL, TRIGP, CHHD, CHHDX  Lab Results   Component Value Date/Time    Glucose (POC) 146 07/30/2016 04:14 PM    Glucose (POC) 119 07/30/2016 10:13 AM    Glucose (POC) 88 12/06/2012 01:34 PM     Lab Results   Component Value Date/Time    Color YELLOW/STRAW 05/09/2014 02:48 PM    Appearance CLEAR 05/09/2014 02:48 PM    Specific gravity 1.020 05/09/2014 02:48 PM    pH (UA) 8.5 05/09/2014 02:48 PM    Protein 30 05/09/2014 02:48 PM    Glucose NEGATIVE  05/09/2014 02:48 PM    Ketone NEGATIVE  05/09/2014 02:48 PM    Bilirubin NEGATIVE  05/09/2014 02:48 PM    Urobilinogen 0.2 05/09/2014 02:48 PM    Nitrites NEGATIVE  05/09/2014 02:48 PM    Leukocyte Esterase NEGATIVE  05/09/2014 02:48 PM    Epithelial cells FEW 05/09/2014 02:48 PM    Bacteria NEGATIVE  05/09/2014 02:48 PM    WBC 0-4 05/09/2014 02:48 PM    RBC 0-5 05/09/2014 02:48 PM         Medications Reviewed:     Current Facility-Administered Medications   Medication Dose Route Frequency   ???  LORazepam (ATIVAN) tablet 1 mg  1 mg Oral Q12H PRN   ??? influenza vaccine 2017-18 (3 yrs+)(PF) (FLUZONE QUAD/FLUARIX QUAD) injection 0.5 mL  0.5 mL IntraMUSCular PRIOR TO DISCHARGE   ??? oxyCODONE ER (OxyCONTIN) tablet 10 mg  10 mg Oral Q12H   ??? senna-docusate (PERICOLACE) 8.6-50 mg per tablet 1 Tab  1 Tab Oral DAILY   ??? cyclobenzaprine (FLEXERIL) tablet 10 mg  10 mg Oral TID PRN   ??? LORazepam (ATIVAN) tablet 1 mg  1 mg Oral Q4H PRN    ??? haloperidol lactate (HALDOL) injection 2 mg  2 mg IntraVENous Q6H PRN   ??? LORazepam (ATIVAN) injection 2 mg  2 mg IntraVENous Q1H PRN   ??? fentaNYL citrate (PF) injection 50-100 mcg  50-100 mcg IntraVENous Q1H PRN   ??? traZODone (DESYREL) tablet 50 mg  50 mg Oral QHS   ??? levETIRAcetam (KEPPRA) tablet 1,750 mg  1,750 mg Oral Q12H   ??? lacosamide (VIMPAT) tablet 200 mg  200 mg Oral Q12H   ??? sodium chloride (NS) flush 5-10 mL  5-10 mL IntraVENous Q8H   ??? sodium chloride (NS) flush 5-10 mL  5-10 mL IntraVENous PRN   ??? 0.9% sodium chloride infusion 250 mL  250 mL IntraVENous PRN   ??? nicotine (NICODERM CQ) 21 mg/24 hr patch 1 Patch  1 Patch TransDERmal DAILY   ??? oxyCODONE-acetaminophen (PERCOCET 10) 10-325 mg per tablet 1 Tab  1 Tab Oral Q4H PRN   ??? sodium chloride (NS) flush 5-10 mL  5-10 mL IntraVENous Q8H   ??? sodium chloride (NS) flush 5-10 mL  5-10 mL IntraVENous PRN   ??? acetaminophen (TYLENOL) tablet 650 mg  650 mg Oral Q4H PRN   ??? ondansetron (ZOFRAN) injection 4 mg  4 mg IntraVENous Q4H PRN   ??? famotidine (PEPCID) tablet 20 mg  20 mg Oral BID   ??? venlafaxine-SR (EFFEXOR-XR) capsule 150 mg  150 mg Oral DAILY WITH BREAKFAST     ______________________________________________________________________  EXPECTED LENGTH OF STAY: 3d 7h  ACTUAL LENGTH OF STAY:          6                 Nakul Avino W Bomar IV, MD

## 2016-08-05 NOTE — Progress Notes (Signed)
Post Fall Documentation      Oluwaseun Peppel witnessed/unwitnessed fall occurred on 08/05/2016 (Date) at 0137 (Time).     The answers to the following questions summarize the fall:     ?? In the patient's own words,:  ?? What was he/she doing when he/she fell? Trying to get out of bed    ?? What are his/her complaints? Hit his head, back side    ?? Nurse:  ?? Document observation, treatment, conversation, follow-up, and patient response. -- Found pt. On floor, contact MD, MD at bed side, received CT head and spine order, RN closely monitor pt.  Pt. Continue climb out of bed even after explanation of safety concern.  Pt A&O x 4.     ?? What was the patient's condition when found (i.e., pain, symptoms, cuts, bruises)? Hit back of head, no bruises.  Continue to back back pain same as before fall.  POD#3 (Back surgery)    ?? What specific complaints did the patient have? Pt want to leave hospital and go home.  Continue pain on back from post surgery.     ?? What did the staff do when patient was found (i.e., vital signs, returned to bed with fall alarm, side rails up)? Vital signs, return pt. To bed, continue bed alarm, side rails up with seizure precaution padding.    ?? Which physician was notified? Dr. Lesle ReekPegram.    Waree Panyason

## 2016-08-05 NOTE — Progress Notes (Signed)
Problem: Falls - Risk of  Goal: *Absence of Falls  Document Schmid Fall Risk and appropriate interventions in the flowsheet.   Outcome: Progressing Towards Goal  Fall Risk Interventions:  Mobility Interventions: Assess mobility with egress test, Communicate number of staff needed for ambulation/transfer, OT consult for ADLs, Patient to call before getting OOB, PT Consult for mobility concerns, PT Consult for assist device competence, Strengthening exercises (ROM-active/passive)     Mentation Interventions: Adequate sleep, hydration, pain control, Bed/chair exit alarm, Door open when patient unattended, Evaluate medications/consider consulting pharmacy, Increase mobility, More frequent rounding, Reorient patient     Medication Interventions: Assess postural VS orthostatic hypotension, Bed/chair exit alarm, Evaluate medications/consider consulting pharmacy, Patient to call before getting OOB, Teach patient to arise slowly, Utilize gait belt for transfers/ambulation     Elimination Interventions: Call light in reach, Bed/chair exit alarm, Patient to call for help with toileting needs, Toilet paper/wipes in reach, Toileting schedule/hourly rounds, Urinal in reach     History of Falls Interventions: Bed/chair exit alarm, Door open when patient unattended, Consult care management for discharge planning, Evaluate medications/consider consulting pharmacy, Investigate reason for fall, Room close to nurse's station

## 2016-08-05 NOTE — Progress Notes (Signed)
Bedside shift change report given to Maisie Fus, Charity fundraiser (oncoming nurse) by Elpidio Galea, RN (offgoing nurse). Report included the following information SBAR, Kardex, ED Summary, Procedure Summary, Intake/Output, MAR, Accordion, Recent Results, Med Rec Status and Cardiac Rhythm NSR, Sinus Tach with movement.

## 2016-08-05 NOTE — Progress Notes (Signed)
Pt seen and evaluated after nurse called stating patient fell out of bed and threatening to leave hospital AMA.  Pt reportedly hit his head during the fall.    PE:  GEN-NAD  PSYCH-A&Ox3  NEURO-GCS 15.  Moves all extremities x 4 with generalized weakness.  No slurred speech.  No facial droop.  Sensation grossly intact.    HEENT-chronic skull post surgical deformity/pupils 2 mm reactive bilateral. Oropharynx clear.  Teeth intact.  NECK-supple, trachea midline. Non-tender.  No acute palpable bony/ soft tissue deformity  LUNGS-CTA B  HEART-RRR  ABD-soft, NT,ND, + BS. No R/G/R  BACK- tenderness on palpation thoracic spine with dressings dry/intact  VASCULAR-2+ radial/1+DP pulses bilateral. No C/C/E  SKIN-warm/dry  MS-no calf tenderness    A/P:  S/p fall s/p T5 LAMINECTOMY WITH POSTERIOR INSTRUMENTATION AND FUSION AT T3-7  -  Strongly encouraged patient to adhere to medical advice and stay in bed as he is extreme fall risk.        Patient agrees to comply to advice and to stay in hospital for treatment.  -  Ordered stat CT head and CT thoracic spine with severe pain/ tenderness on evaluation.  R/o acute        Process.

## 2016-08-05 NOTE — Progress Notes (Signed)
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 He was found to have a right SDH without subsequent seizures since admission with postictal Todd's paralysis on the left and had prolonged postictal confusion, with possible contribution from SDH as well as pain medications, improved. Additionally found to have T5 burst fracture, s/p surgery.  Exam with improved mental status, has left sided neglect, left UE with 5/5 grip/bicep, 4/5 deltoid, +PD, left LE with 4+/5 HF, 5/5 DF   CT head and T-spine 08/05/16 at 0240 were stable.   Plan:   1. Continue Vimpat 200mg  bid  2. Continue Keppra 1750mg  bid  3. Start Lorazepam 1mg  q12 hours prn for left sided twitching  4. F/u in clinic with primary neurologist or with me at next available appt after d/c from Rehab. Mom was given my card with number to office.   5. Please call if needed.    Chart reviewed since /last seen  Subjective:   Pt fell out of bed last n/ight hitting his head, no LOC.  Pt reported left LE twitching earlier today to NSG and stated that this may indicate onset of a seizure, Mom was present and confirmed this.  Pt tells me that he has had left sided twitching ever since his first surgery and it can indicate a seizure coming on, so he typically take lorazepam 1mg  as needed when  this occurs to prevent seizure. This only occurs rarely, may go 4-6 weeks without a spell of twitching.     Objective:     Current Facility-Administered Medications   Medication Dose Route Frequency   ??? oxyCODONE ER (OxyCONTIN) tablet 10 mg  10 mg Oral Q12H   ??? senna-docusate (PERICOLACE) 8.6-50 mg per tablet 1 Tab  1 Tab Oral DAILY   ??? cyclobenzaprine (FLEXERIL) tablet 10 mg  10 mg Oral TID PRN   ??? LORazepam (ATIVAN) tablet 1 mg  1 mg Oral Q4H PRN   ??? haloperidol lactate (HALDOL) injection 2 mg  2 mg IntraVENous  Q6H PRN   ??? LORazepam (ATIVAN) injection 2 mg  2 mg IntraVENous Q1H PRN   ??? fentaNYL citrate (PF) injection 50-100 mcg  50-100 mcg IntraVENous Q1H PRN   ??? traZODone (DESYREL) tablet 50 mg  50 mg Oral QHS   ??? levETIRAcetam (KEPPRA) tablet 1,750 mg  1,750 mg Oral Q12H   ??? lacosamide (VIMPAT) tablet 200 mg  200 mg Oral Q12H   ??? sodium chloride (NS) flush 5-10 mL  5-10 mL IntraVENous Q8H   ??? sodium chloride (NS) flush 5-10 mL  5-10 mL IntraVENous PRN   ??? 0.9% sodium chloride infusion 250 mL  250 mL IntraVENous PRN   ??? nicotine (NICODERM CQ) 21 mg/24 hr patch 1 Patch  1 Patch TransDERmal DAILY   ??? oxyCODONE-acetaminophen (PERCOCET 10) 10-325 mg per tablet 1 Tab  1 Tab Oral Q4H PRN   ??? sodium chloride (NS) flush 5-10 mL  5-10 mL IntraVENous Q8H   ??? sodium chloride (NS) flush 5-10 mL  5-10 mL IntraVENous PRN   ??? acetaminophen (TYLENOL) tablet 650 mg  650 mg Oral Q4H PRN   ??? ondansetron (ZOFRAN) injection 4 mg  4 mg IntraVENous Q4H PRN   ??? famotidine (PEPCID) tablet 20 mg  20 mg Oral BID   ??? venlafaxine-SR (EFFEXOR-XR) capsule 150 mg  150 mg Oral DAILY WITH BREAKFAST       Visit Vitals   ??? BP 136/82 (BP 1 Location: Left arm, BP Patient Position: Sitting)   ??? Pulse 87   ??? Temp 98.1 ??F (36.7 ??C)   ??? Resp 16   ??? Ht 5' 10.98" (1.803 m)   ??? Wt 63.1 kg (139 lb 1.8 oz)   ??? SpO2 97%   ??? BMI 19.41 kg/m2     Temp (24hrs), Avg:97.9 ??F (36.6 ??C), Min:97.4 ??F (36.3 ??C), Max:98.3 ??F (36.8 ??C)         09/20 1901 - 09/22 0700   In: 1498.1 [P.O.:480; I.V.:1018.1]  Out: 3860 [Urine:3800; Drains:60]      Physical Exam:  General: Well developed well nourished patient in no apparent distress.     Neurological Exam:  Mental Status: Alert, calm, appropriate.  Oriented to person, hospital [place] [place], [address], [address], has difficulty with year, "1917, 1920, 2019."  Speech and language intact. Attention and fund of knowledge appropriate.     Cranial Nerves:   EOMI, no nystagmus, no ptosis. Facial movement is symmetric.   Hearing is intact.   Motor:  5/5 right U/L extremity, neglects left side, Left U/L extremity: 5/5 grip, bicep, 4/5 Deltoid, 4+/5 HF, 5/5 DF, +PD on left   Reflexes:   2+ throughout except left ankle with clonus, upgoing toe on left   Sensory:      Gait:  Non-ambulatory currently   Cerebellar:           Lab Review   No results found for this or any previous visit (from the past 24 hour(s)).    Additional comments:  I have reviewed the patient's new clinical lab test results.  I have personally reviewed the patient's radiographs.  Final result (Exam End: 08/05/2016 ??2:40 AM) Open    Study Result   EXAM:  CT HEAD WO CONT  ??  INDICATION:   s/p fall/ hit head/  ??  COMPARISON: August 02, 2016.  ??  CONTRAST:  None.  ??  TECHNIQUE: Unenhanced CT of the head was performed using 5 mm images. Brain and  bone windows were generated.  CT dose reduction  was achieved through use of a  standardized protocol tailored for this examination and automatic exposure  control for dose modulation.    ??  FINDINGS:  The ventricles and sulci are stable in size, shape and configuration.  Encephalomalacia in the right parietal and occipital lobes is unchanged.  Previously seen subdural hematoma along the posterior falx and tentorium is  unchanged in appearance. Small right frontoparietal subdural hematoma is stable  in size and decreased in attenuation. There is no new intracranial hemorrhage,  mass, mass effect or midline shift.  The basilar cisterns are open. No acute   infarct is identified. The bone windows demonstrate an old right-sided  craniotomy defect.. The visualized portions of the paranasal sinuses and mastoid  air cells are clear.  ??    IMPRESSION: Previously seen subdural hematoma is stable in size. No new intracranial hemorrhage.     Final result (Exam End: 08/05/2016 ??2:40 AM) Open    Study Result   EXAM:  CT SPINE THORAC WO CONT   INDICATION: Fall. Severe thoracic pain..  COMPARISON: August 03, 2016.  ??  TECHNIQUE:   Multislice helical CT of the thoracic spine was performed. Sagittal and coronal  reformations were generated. CT dose reduction was achieved through use of a  standardized protocol tailored for this examination and automatic exposure  control for dose modulation.  ??  FINDINGS:  The alignment of the thoracic spine is unchanged.   T5 compression fracture is unchanged in appearance; there is persistent  posterior displacement of the posterior T5 cortex, but there is no evidence of  spinal stenosis secondary to the T5 laminectomy. Posterior fusion hardware from  T3 through T7 is unchanged in appearance.  There is no new fracture or other acute abnormality.    There is no spinal canal stenosis.  Postoperative changes are seen in the soft tissues overlying the mid to upper  thoracic spine. Small bilateral pleural effusions are present.    ??  IMPRESSION  IMPRESSION: Postoperative changes from T3 through T7 appear stable. Unchanged T5  compression fracture. No new fracture is evident.           CT scan   CT Results (most recent):  Final result (Exam End: 07/31/2016 ??9:00 AM) Open    Study Result   HEAD CT WITHOUT CONTRAST: 07/31/2016 9:00 AM  ??  INDICATION: Sub-dural hemorrhage  ??  COMPARISON: 07/30/2016.  ??  PROCEDURE: Axial images of the head were obtained without contrast. Coronal and  sagittal reformats were performed. CT dose reduction was achieved through use of  a standardized protocol tailored for this examination and automatic exposure   control for dose modulation.  Adaptive statistical iterative reconstruction  (ASIR) was utilized.  ??  FINDINGS: There is chronic encephalomalacia in the right parietal and occipital  lobes post right parietal craniotomy. Small right parietal subdural hemorrhage  is stable. The axial obliquity of the scan mimics midline shift, however, there  is no significant right to left midline shift. No new hemorrhage. The ventricles  and sulci are appropriate in size and configuration for age. No new loss of  gray-white differentiation to suggest late acute or early subacute infarction.  An oval, circumscribed, superficial skin lesion superior to the right parotid  likely represents a sebaceous cyst.  ??  IMPRESSION: Stable small right parietal subdural hemorrhage.           Results from Hospital Encounter encounter on 07/30/16   CT SPINE [facility] WO CONT   Narrative EXAM:  CT SPINE [facility] WO CONT   INDICATION: Fall. Severe thoracic pain..  COMPARISON: August 03, 2016.    TECHNIQUE:   Multislice helical CT of the thoracic spine was performed. Sagittal and coronal  reformations were generated. CT dose reduction was achieved through use of a  standardized protocol tailored for this examination and automatic exposure  control for dose modulation.    FINDINGS:  The alignment of the thoracic spine is unchanged.   T5 compression fracture is unchanged in appearance; there is persistent  posterior displacement of the posterior T5 cortex, but there is no evidence of  spinal stenosis secondary to the T5 laminectomy. Posterior fusion hardware from  T3 through T7 is unchanged in appearance.  There is no new fracture or other acute abnormality.    There is no spinal canal stenosis.  Postoperative changes are seen in the soft tissues overlying the mid to upper  thoracic spine. Small bilateral pleural effusions are present.           Impression IMPRESSION: Postoperative changes from T3 through T7 appear stable. Unchanged T5   compression fracture. No new fracture is evident.            Care Plan discussed with:  Patient x   Family    RN    Care Manager    Consultant/Specialist:  x     Signed:Jodi Mourning, MD

## 2016-08-05 NOTE — Progress Notes (Signed)
Spoke with staff, patient, and mother.  History per mother indicates pt was given opiates [facility].  Still did show pretty strong tolerance to medication given for pain.  Now on much reduced dose, mild duscomfort in bed related to back injury some time ago.  Mother states that until brain surgery, with last being for oligoastrocytoma stage 3, he was easy going.  Now appears his sudden outburst are more related to executive function compromises and increased emotional tone as causes.  Mother's history states that he was fine before brain surgeries and now with the  3 years ago being in control is challenged.Mother also noted decline in memory and difficulty formulating sentences.  During this interview pt did show some deficits in staying on track in conversations, some problems with recall but not enough to be concerned about dementia.  Mother emphasized memory lapses more of a handicap especially with ADLs.  Dx of substance abuse seems inaccurate at this point but not sure how reliable mother is. 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

## 2016-08-06 ENCOUNTER — Inpatient Hospital Stay: Admit: 2016-08-06 | Payer: PRIVATE HEALTH INSURANCE | Primary: Family Medicine

## 2016-08-06 MED ORDER — DIPHENHYDRAMINE 12.5 MG/5 ML ELIXIR
12.5 mg/5 mL | Freq: Four times a day (QID) | ORAL | Status: DC | PRN
Start: 2016-08-06 — End: 2016-08-08

## 2016-08-06 MED FILL — NORMAL SALINE FLUSH 0.9 % INJECTION SYRINGE: INTRAMUSCULAR | Qty: 10

## 2016-08-06 MED FILL — OXYCODONE-ACETAMINOPHEN 10 MG-325 MG TAB: 10-325 mg | ORAL | Qty: 1

## 2016-08-06 MED FILL — LORAZEPAM 1 MG TAB: 1 mg | ORAL | Qty: 1

## 2016-08-06 MED FILL — FAMOTIDINE 20 MG TAB: 20 mg | ORAL | Qty: 1

## 2016-08-06 MED FILL — TRAZODONE 50 MG TAB: 50 mg | ORAL | Qty: 1

## 2016-08-06 MED FILL — NICOTINE 21 MG/24 HR DAILY PATCH: 21 mg/24 hr | TRANSDERMAL | Qty: 1

## 2016-08-06 MED FILL — OXYCODONE ER 10 MG TABLET,CRUSH RESISTANT,EXTENDED RELEASE 12 HR: 10 mg | ORAL | Qty: 1

## 2016-08-06 MED FILL — VIMPAT 50 MG TABLET: 50 mg | ORAL | Qty: 4

## 2016-08-06 MED FILL — LEVETIRACETAM 250 MG TAB: 250 mg | ORAL | Qty: 1

## 2016-08-06 MED FILL — CYCLOBENZAPRINE 10 MG TAB: 10 mg | ORAL | Qty: 1

## 2016-08-06 MED FILL — VENLAFAXINE SR 150 MG 24 HR CAP: 150 mg | ORAL | Qty: 1

## 2016-08-06 MED FILL — DOK PLUS 8.6 MG-50 MG TABLET: ORAL | Qty: 1

## 2016-08-06 NOTE — Progress Notes (Addendum)
1100 PT on the floor. Pt premedicated with percocet. Pt walked down the hall with PT.   1214 pt complaining of left leg twitching and said this is his aura. Ativan PO administered. Pt placed sublingual and twitching resolved after a 10 minutes.   1230 pt complaining of back pain and requesting flexeril. We discussed avoiding fentanyl as he needs to be weaned down on narcotics. Pt bathed and assisted back to bed.   1600 Pt IV infiltrated. Placed new #20 in right posterior forearm. PT requested tylenol PM. Called Dr. Micheal Likenshong who will put in order.

## 2016-08-06 NOTE — Progress Notes (Addendum)
Spiritual Care Partner Volunteer visited patient in 6S on 9.22.17.    Documented by:  Micah Jackson, Staff Chaplain  Please call [phone] to page chaplain if needed

## 2016-08-06 NOTE — Progress Notes (Signed)
Problem: Mobility Impaired (Adult and Pediatric)  Goal: *Acute Goals and Plan of Care (Insert Text)  Physical Therapy Goals  Initiated 08/03/2016  1. Patient will move from supine to sit and sit to supine and scoot up and down in bed with supervision/set-up within 4 day(s).   2. Patient will transfer from bed to chair and chair to bed with supervision/set-up using the least restrictive device within 4 day(s).  3. Patient will perform sit to stand with supervision/set-up within 4 day(s).  4. Patient will ambulate with minimal assistance/contact guard assist for 150 feet with the least restrictive device within 4 day(s).   5. Patient will ascend/descend 3 stairs with no handrail(s) with minimal assistance/contact guard assist within 4 day(s).  6. Patient will improve Berg Balance score by 7 points within 7 days.   PHYSICAL THERAPY TREATMENT  Patient: Carl Howell (40 y.o. male)  Date: 08/06/2016  Diagnosis: subdural bleed  Subdural hematoma (HCC)  UNKNOWN Subdural hematoma (HCC)  Procedure(s) (LRB):  T5 LAMINECTOMY WITH POSTERIOR INSTRUMENTATION AND FUSION AT T3-8 (N/A) 4 Days Post-Op  Precautions: Fall, Back      ASSESSMENT:  Pt is making slow, steady progress, cleared for therapy per RN, pt agreeable and eager to increase his activity to regain his strength, pt was unable to recall back precautions, reviewed all and importance of log rolling, pt completed log roll to edge of bed with supervision, sit to stand with rolling walker with minimal assist x 1, pt ambulated with rolling walker x 110 feet with minimal assist x 2, gait is ataxic with decreased awareness of LLE and safety, pt is impulsive, needs cueing to increase step clearance LLE and to keep within the walker, has an occasional mild buckling but no overt loss of balance, pt is motivated to regain his strength and independence, good rehab candidate, will be able to tolerate 3 hours of therapy, recommend inpatient rehab  Progression toward goals:   [ ]       Improving appropriately and progressing toward goals  [X]       Improving slowly and progressing toward goals  [ ]       Not making progress toward goals and plan of care will be adjusted       PLAN:  Patient continues to benefit from skilled intervention to address the above impairments.  Continue treatment per established plan of care.  Discharge Recommendations:  Inpatient Rehab  Further Equipment Recommendations for Discharge:  To be determined        SUBJECTIVE:   Patient stated ???I want to walk more so I can get stronger.???  "My back is terrible".  The patient stated 0/3 back precautions. Reviewed all 3 with patient. Reviewed importance of calling out for assistance for mobility.  Pt voiced a good understanding.       OBJECTIVE DATA SUMMARY:   Critical Behavior:  Neurologic State: Alert  Orientation Level: Oriented X4  Cognition: Follows commands, Impaired decision making, Impulsive  Safety/Judgement: Decreased awareness of need for safety, Decreased awareness of need for assistance  Functional Mobility Training:  Bed Mobility:  Pt initially sat straight up in bed though HOB elevated, reviewed log roll technique  Log Rolling: Supervision  Supine to Sit: Supervision           Transfers:  Sit to Stand: Assist x1;Minimum assistance  Stand to Sit: Contact guard assistance;Assist x1  Balance:  Sitting: Intact  Standing: Impaired  Standing - Static: Fair  Standing - Dynamic : Fair  Ambulation/Gait Training:  Distance (ft): 110 Feet (ft)  Assistive Device: Walker, rolling;Gait belt, utilized walker as pt is maximum assist x 2 without additional support  Ambulation - Level of Assistance: Minimal assistance;Assist x2 with walker support        Gait Abnormalities: Ataxic;Decreased step clearance;Antalgic, step to gait pattern, occasional mild buckling with no overt loss of balance, demonstrates decreased safety awareness and impulsiveness, pt able to step  forward with LLE but requires increased concentration and cueing from therapist for increased awareness of LLE and to stay within the walker,  frequently stepping outside of walker, decreased awareness of LLE and frequently dragging LLE        Base of Support: Narrowed     Speed/Cadence: Fluctuations, needs cues to slow down  Step Length: Left shortened;Right shortened              Pain:  Pain Scale 1: Numeric (0 - 10)  Pain Intensity 1: "terrible", did not rate           Pain Intervention(s) 1: Medication (see MAR)  Activity Tolerance:   Gradually improving      After treatment:   [X]   Patient left in no apparent distress sitting up in chair  [ ]   Patient left in no apparent distress in bed  [ ]   Call bell left within reach  [X]   Nursing notified  [ ]   Caregiver present  [X]   Bed alarm activated      COMMUNICATION/COLLABORATION:   The patient???s plan of care was discussed with: Registered Nurse     Tatem Fesler Celene SkeenS Cavin Longman   Time Calculation: 19 mins

## 2016-08-06 NOTE — Progress Notes (Signed)
Spiritual Care Assessment/Progress Notes    Carl Howell [id number]  [id number]    02/16/1976  40 y.o.  male    Patient Telephone Number: [phone] (home)   Religious Affiliation: No religion   Language: English   Extended Emergency Contact Information  Primary Emergency Contact: [facility] [place]  Home Phone: [phone]  Relation: Parent   Patient Active Problem List    Diagnosis Date Noted   ??? Closed unstable burst fracture of fifth thoracic vertebra (HCC) 08/01/2016   ??? Glioma (HCC) 07/31/2016   ??? Seizures (HCC) 07/30/2016   ??? Subdural hematoma (HCC) 07/30/2016   ??? Epilepsy (HCC)         Date: 08/06/2016       Level of Religious/Spiritual Activity:           Involved in faith tradition/spiritual practice             Not involved in faith tradition/spiritual practice           Spiritually oriented             Claims no spiritual orientation             seeking spiritual identity           Feels alienated from religious practice/tradition           Feels angry about religious practice/tradition           Spirituality/religious tradition may be a Theatre stage manager for coping at this time.           Not able to assess due to medical condition    Services Provided Today:           crisis intervention             reading Scriptures           spiritual assessment             prayer           empathic listening/emotional support           rites and rituals (cite in comments)           life review              religious support           theological development            advocacy           ethical dialog              blessing           bereavement support             support to family           anticipatory grief support            help with AMD           spiritual guidance             meditation      Spiritual Care Needs           Emotional Support           Spiritual/Religious Care           Loss/Adjustment           Advocacy/Referral                /Ethics           No  needs expressed at                this time           Other: (note in               comments)  Spiritual Care Plan           Follow up visits with               pt/family           Provide materials           Schedule sacraments           Contact Community               Clergy           Follow up as needed           Other: (note in               comments)     Offered support to this pt in [facility] 662.  Pt appeared to be resting and pt's mother approached the door to request that [facility] return at later time.  On this CH's first attempt, pt had multiple visitors which may have contributed to pt's fatigue.  Contact spiritual care services for any emotional or spiritual needs.    Rae Halsted, MDiv. Staff Chaplain  Please call [phone]) to page chaplain if needed

## 2016-08-06 NOTE — Progress Notes (Signed)
Hospitalist Progress Note  Kalman DrapeKyong U Aleana Fifita, MD  Office: [phone]        Date of Service:  08/06/2016  NAME:  Carl Howell  DOB:  [birth year]  MRN:  [id number]      Admission Summary:   40 yo male with PMhx of Rt Parietal occipital  Oligoastrocytoma s/p resection 2008, s/p chemo, seizure d/o, admitted for fall and witnessed seizures. [facility] showing SDH.    Interval history / Subjective:   Overnight: report that pt went again advise and went out of bed and fell.     Assessment & Plan:     40yo male with a right parietal subdural hematoma, breakthrough seizure from epilepsy, and vertebral FXR of 5th thoracic vertebrae s/p T5 laminectomy with post fusion at T3-T7.    PROBLEM LIST:   1. Right parietal subdural hematoma  2. Vertebral FXR of 5th thoracic vertebrae  3. Epilepsy with breakthrough seizure  4. Oligoastrocytoma  5. Depression  6. Chronic tobacco/nicotine usage    PLAN: Continue current Neurology and Neurosurgery recommendations. Continue pain mgmt per Neurosurgery. Continue to observe subdural hematoma. Continue Neurology recommendations regarding seizure. Continue Vimpat and Keppra. Pt has been counseled on the importance of smoking/tobacco cessation during hospital stay. Continue nicotine patch.     9/23 Neurology Note:  "--1. Continue Vimpat 200mg  bid  2. Continue Keppra 1750mg  bid  3. Start Lorazepam 1mg  q12 hours prn for left sided twitching  4. F/u in clinic with primary neurologist or with me at next available appt after d/c from Rehab. Mom was given my card with number to office.   5. Please call if needed."      Code status: FULL CODE  DVT prophylaxis: SCD hose    Care Plan discussed with: Patient/Family  Disposition: [facility]/Rehab and TBD patient states they are working on rehab     Hospital Problems  Date Reviewed: 07/31/2016          Codes Class Noted POA     Closed unstable burst fracture of fifth thoracic vertebra (HCC) ICD-10-CM: L24.401US22.052A  ICD-9-CM: 805.2  08/01/2016 Yes        Glioma (HCC) ICD-10-CM: C71.9  ICD-9-CM: 191.1  07/31/2016 Yes        Seizures (HCC) ICD-10-CM: R56.9  ICD-9-CM: 780.39  07/30/2016 Yes        * (Principal)Subdural hematoma (HCC) ICD-10-CM: I62.00  ICD-9-CM: 432.1  07/30/2016 Unknown        Epilepsy (HCC) ICD-10-CM: G40.909  ICD-9-CM: 345.90  Unknown Yes                Review of Systems:   A comprehensive review of systems was negative except for that written in the HPI.       Vital Signs:    Last 24hrs VS reviewed since prior progress note. Most recent are:  Visit Vitals   ??? BP 154/89 (BP 1 Location: Left arm, BP Patient Position: At rest)   ??? Pulse 73   ??? Temp 97.9 ??F (36.6 ??C)   ??? Resp 15   ??? Ht 5' 10.98" (1.803 m)   ??? Wt 65.5 kg (144 lb 6.4 oz)   ??? SpO2 99%   ??? BMI 20.15 kg/m2         Intake/Output Summary (Last 24 hours) at 08/06/16 0752  Last data filed at 08/06/16 0406   Gross per 24 hour   Intake  0 ml   Output             1600 ml   Net            -1600 ml        Physical Examination:             Constitutional:  male, lying in bed, No acute distress, cooperative, pleasant??   ENT:  Oral mucous moist, oropharynx benign. Neck supple,    Resp:  CTA bilaterally. No wheezing/rhonchi/rales. No accessory muscle use   CV:  Regular rhythm, normal rate, no gallops, rubs    GI:  Soft, non distended, non tender. normoactive bowel sounds    Musculoskeletal:  No edema, warm, 2+ pulses throughout    Neurologic:  Moves all extremities.              Data Review:    Review and/or order of clinical lab test      Labs:     Recent Labs      08/03/16   1246  08/03/16   0913   WBC  7.7  5.5   HGB  10.6*  6.0*   HCT  32.3*  18.5*   PLT  275  137*     Recent Labs      08/03/16   1246  08/03/16   0913   NA  137  142   K  3.8  6.2*   CL  103  113*   CO2  26  25   BUN  6  5*   CREA  0.71  0.49*   GLU  99  82   CA  8.3*  7.2*       Lab Results    Component Value Date/Time    Glucose (POC) 146 07/30/2016 04:14 PM    Glucose (POC) 119 07/30/2016 10:13 AM    Glucose (POC) 88 12/06/2012 01:34 PM     Lab Results   Component Value Date/Time    Color YELLOW/STRAW 05/09/2014 02:48 PM    Appearance CLEAR 05/09/2014 02:48 PM    Specific gravity 1.020 05/09/2014 02:48 PM    pH (UA) 8.5 05/09/2014 02:48 PM    Protein 30 05/09/2014 02:48 PM    Glucose NEGATIVE  05/09/2014 02:48 PM    Ketone NEGATIVE  05/09/2014 02:48 PM    Bilirubin NEGATIVE  05/09/2014 02:48 PM    Urobilinogen 0.2 05/09/2014 02:48 PM    Nitrites NEGATIVE  05/09/2014 02:48 PM    Leukocyte Esterase NEGATIVE  05/09/2014 02:48 PM    Epithelial cells FEW 05/09/2014 02:48 PM    Bacteria NEGATIVE  05/09/2014 02:48 PM    WBC 0-4 05/09/2014 02:48 PM    RBC 0-5 05/09/2014 02:48 PM                      Fabio Pierce Maxine Glenn, MD

## 2016-08-06 NOTE — Progress Notes (Signed)
CM follow up. CM spoke with Darlyn Read, [facility] Liaison [phone], who said that patient was accepted for admission by [facility] on 08/05/16 at 4:30PM which was too late to start insurance authorization. Insurance authorization will be started Monday morning 08/08/16.

## 2016-08-06 NOTE — Progress Notes (Signed)
Neurosurgery  Doing well  Pain controlled in back  Wound dressed and dry    Awaiting rehab auth Monday?

## 2016-08-06 NOTE — Progress Notes (Addendum)
Bedside and Verbal shift change report given to dawn, rn (oncoming nurse) by Carmie Kanner, rn (offgoing nurse). Report included the following information SBAR, Kardex, ED Summary, Intake/Output, MAR, Accordion, Recent Results and Cardiac Rhythm NSR.

## 2016-08-06 NOTE — Progress Notes (Signed)
Nurse called earlier reporting that patient again tonight mobilized out of his bed against advice and fell on the floor.  As I was involved in another case (CODE ATLAS), I was not able to immediate evaluate the patient.  Patient however is hemodynamically stable.      Pt now seen and evaluated again s/p recurrent fall.    PE:  GEN-NAD  PSYCH-A&Ox3  NEURO-GCS 15.  Moves all extremities x 4 with generalized weakness.  No slurred speech.  No facial droop.  Sensation grossly intact.    HEENT-chronic skull post surgical deformity/pupils 2 mm reactive bilateral. Oropharynx clear.  Teeth intact.  NECK-supple, trachea midline. Non-tender.  No acute palpable bony/ soft tissue deformity  LUNGS-CTA B  HEART-RRR  ABD-soft, NT,ND, + BS. No R/G/R  BACK- tenderness on palpation thoracic spine with dressings dry/intact  VASCULAR-2+ radial/1+DP pulses bilateral. No C/C/E  SKIN-warm/dry  MS-no calf tenderness  ??  A/P:  S/p fall s/p T5 LAMINECTOMY WITH POSTERIOR INSTRUMENTATION AND FUSION AT T3-7  -  Strongly encouraged patient to adhere to medical advice and stay in bed as he is extreme fall risk.        Patient agrees to comply to advice.  -  Ordered stat CT head r/o an acute intracranial process  ??

## 2016-08-07 LAB — METABOLIC PANEL, BASIC
Anion gap: 7 mmol/L (ref 5–15)
BUN/Creatinine ratio: 9 — ABNORMAL LOW (ref 12–20)
BUN: 6 MG/DL (ref 6–20)
CO2: 29 mmol/L (ref 21–32)
Calcium: 9 MG/DL (ref 8.5–10.1)
Chloride: 98 mmol/L (ref 97–108)
Creatinine: 0.7 MG/DL (ref 0.70–1.30)
GFR est AA: >60 mL/min/{1.73_m2} (ref >60)
GFR est non-AA: >60 mL/min/{1.73_m2} (ref >60)
Glucose: 109 mg/dL — ABNORMAL HIGH (ref 65–100)
Potassium: 3.6 mmol/L (ref 3.5–5.1)
Sodium: 134 mmol/L — ABNORMAL LOW (ref 136–145)

## 2016-08-07 LAB — CBC WITH AUTOMATED DIFF
ABS. BASOPHILS: 0 10*3/uL (ref 0.0–0.1)
ABS. EOSINOPHILS: 0.2 10*3/uL (ref 0.0–0.4)
ABS. LYMPHOCYTES: 1.2 10*3/uL (ref 0.8–3.5)
ABS. MONOCYTES: 0.8 10*3/uL (ref 0.0–1.0)
ABS. NEUTROPHILS: 7.6 10*3/uL (ref 1.8–8.0)
BASOPHILS: 0 % (ref 0–1)
EOSINOPHILS: 2 % (ref 0–7)
HCT: 33.9 % — ABNORMAL LOW (ref 36.6–50.3)
HGB: 11.5 g/dL — ABNORMAL LOW (ref 12.1–17.0)
LYMPHOCYTES: 12 % (ref 12–49)
MCH: 31.1 PG (ref 26.0–34.0)
MCHC: 33.9 g/dL (ref 30.0–36.5)
MCV: 91.6 FL (ref 80.0–99.0)
MONOCYTES: 9 % (ref 5–13)
NEUTROPHILS: 77 % — ABNORMAL HIGH (ref 32–75)
PLATELET: 446 10*3/uL — ABNORMAL HIGH (ref 150–400)
RBC: 3.7 M/uL — ABNORMAL LOW (ref 4.10–5.70)
RDW: 12.1 % (ref 11.5–14.5)
WBC: 9.9 10*3/uL (ref 4.1–11.1)

## 2016-08-07 MED ORDER — OTHER(NON-FORMULARY)
Freq: Every evening | Status: DC | PRN
Start: 2016-08-07 — End: 2016-08-07

## 2016-08-07 MED FILL — OXYCODONE ER 10 MG TABLET,CRUSH RESISTANT,EXTENDED RELEASE 12 HR: 10 mg | ORAL | Qty: 1

## 2016-08-07 MED FILL — LORAZEPAM 1 MG TAB: 1 mg | ORAL | Qty: 1

## 2016-08-07 MED FILL — OXYCODONE-ACETAMINOPHEN 10 MG-325 MG TAB: 10-325 mg | ORAL | Qty: 1

## 2016-08-07 MED FILL — NORMAL SALINE FLUSH 0.9 % INJECTION SYRINGE: INTRAMUSCULAR | Qty: 10

## 2016-08-07 MED FILL — VIMPAT 50 MG TABLET: 50 mg | ORAL | Qty: 4

## 2016-08-07 MED FILL — FAMOTIDINE 20 MG TAB: 20 mg | ORAL | Qty: 1

## 2016-08-07 MED FILL — VENLAFAXINE SR 150 MG 24 HR CAP: 150 mg | ORAL | Qty: 1

## 2016-08-07 MED FILL — NICOTINE 21 MG/24 HR DAILY PATCH: 21 mg/24 hr | TRANSDERMAL | Qty: 1

## 2016-08-07 MED FILL — TYLENOL 325 MG TABLET: 325 mg | ORAL | Qty: 2

## 2016-08-07 MED FILL — LEVETIRACETAM 250 MG TAB: 250 mg | ORAL | Qty: 1

## 2016-08-07 MED FILL — DOK PLUS 8.6 MG-50 MG TABLET: ORAL | Qty: 1

## 2016-08-07 NOTE — Progress Notes (Signed)
Pt stating he had no sleep last night and was having difficulty staying awake at this time.  Asking to defer tx, will follow up tomorrow.

## 2016-08-07 NOTE — Progress Notes (Signed)
Bedside shift change report given to Gigi Gin, RN (oncoming nurse) by [name], RN (offgoing nurse). Report included the following information SBAR, Kardex, Intake/Output, Recent Results and Cardiac Rhythm NSR.

## 2016-08-07 NOTE — Progress Notes (Signed)
Bedside and Verbal shift change report given to T, RN (Cabin crew) by Carmie Kanner, RN (offgoing nurse). Report included the following information SBAR, Kardex, ED Summary, Intake/Output, MAR, Accordion, Recent Results and Cardiac Rhythm NSR.

## 2016-08-07 NOTE — Progress Notes (Signed)
Problem: Falls - Risk of  Goal: *Absence of Falls  Document Schmid Fall Risk and appropriate interventions in the flowsheet.   Outcome: Progressing Towards Goal  Fall Risk Interventions:  Mobility Interventions: Bed/chair exit alarm, Communicate number of staff needed for ambulation/transfer, Patient to call before getting OOB, PT Consult for mobility concerns, PT Consult for assist device competence, Strengthening exercises (ROM-active/passive), Utilize walker, cane, or other assitive device, Utilize gait belt for transfers/ambulation     Mentation Interventions: Adequate sleep, hydration, pain control, Bed/chair exit alarm, Door open when patient unattended, Eyeglasses and hearing aids, Familiar objects from home, Family/sitter at bedside, Increase mobility, More frequent rounding, Reorient patient, Room close to nurse's station, Toileting rounds, Update white board     Medication Interventions: Assess postural VS orthostatic hypotension, Bed/chair exit alarm, Evaluate medications/consider consulting pharmacy, Patient to call before getting OOB, Teach patient to arise slowly     Elimination Interventions: Bed/chair exit alarm, Call light in reach, Patient to call for help with toileting needs, Toilet paper/wipes in reach, Toileting schedule/hourly rounds, Urinal in reach     History of Falls Interventions: Bed/chair exit alarm, Consult care management for discharge planning, Door open when patient unattended, Evaluate medications/consider consulting pharmacy, Investigate reason for fall, Room close to nurse's station, Utilize gait belt for transfer/ambulation

## 2016-08-07 NOTE — Other (Signed)
Bedside shift change report given to Callie, RN (oncoming nurse) by Peggy, RN (offgoing nurse). Report included the following information SBAR, Kardex, ED Summary, Procedure Summary, Intake/Output, MAR, Accordion, Recent Results and Cardiac Rhythm NSR.

## 2016-08-07 NOTE — Progress Notes (Signed)
Hospitalist Progress Note  Kalman Drape, MD  Office: [phone]        Date of Service:  08/07/2016  NAME:  Carl Howell  DOB:  11-16-1975  MRN:  [id number]      Admission Summary:   40 yo male with PMhx of Rt Parietal occipital  Oligoastrocytoma s/p resection 2008, s/p chemo, seizure d/o, admitted for fall and witnessed seizures. Pt was transferred to [facility] for CT showing SDH.    Interval history / Subjective:   No overnight events reported. Pt stated that he has been asking for assistance rather than trying to get out of bed on his own.     Assessment & Plan:     40yo male with a right parietal subdural hematoma, breakthrough seizure from epilepsy, and vertebral FXR of 5th thoracic vertebrae s/p T5 laminectomy with post fusion at T3-T7.    PROBLEM LIST:   1. Right parietal subdural hematoma  2. Vertebral FXR of 5th thoracic vertebrae  3. Epilepsy with breakthrough seizure  4. Oligoastrocytoma  5. Depression  6. Chronic tobacco/nicotine usage    PLAN: Continue current Neurology and Neurosurgery recommendations. Continue pain mgmt per Neurosurgery. Continue to observe subdural hematoma. Continue Neurology recommendations regarding seizure. Continue Vimpat and Keppra. Pt has been counseled on the importance of smoking/tobacco cessation during hospital stay. Continue nicotine patch.     9/23 Neurology Note:  "--1. Continue Vimpat 200mg  bid  2. Continue Keppra 1750mg  bid  3. Start Lorazepam 1mg  q12 hours prn for left sided twitching  4. F/u in clinic with primary neurologist or with me at next available appt after d/c from Rehab. Mom was given my card with number to office.   5. Please call if needed."    -->Recommend that he has a sitter for the remaining hospital stay here.    Code status: FULL CODE  DVT prophylaxis: SCD hose    Disposition: SAH/Rehab and TBD patient states they are working on rehab     Hospital Problems  Date Reviewed: 08/17/2016           Codes Class Noted POA    Closed unstable burst fracture of fifth thoracic vertebra (HCC) ICD-10-CM: V78.469G  ICD-9-CM: 805.2  08/01/2016 Yes        Glioma (HCC) ICD-10-CM: C71.9  ICD-9-CM: 191.1  08-17-16 Yes        Seizures (HCC) ICD-10-CM: R56.9  ICD-9-CM: 780.39  07/30/2016 Yes        * (Principal)Subdural hematoma (HCC) ICD-10-CM: I62.00  ICD-9-CM: 432.1  07/30/2016 Unknown        Epilepsy (HCC) ICD-10-CM: G40.909  ICD-9-CM: 345.90  Unknown Yes                Review of Systems:   A comprehensive review of systems was negative except for that written in the HPI.       Vital Signs:    Last 24hrs VS reviewed since prior progress note. Most recent are:  Visit Vitals   ??? BP 134/83 (BP 1 Location: Right arm, BP Patient Position: At rest)   ??? Pulse 87   ??? Temp 98.1 ??F (36.7 ??C)   ??? Resp 16   ??? Ht 5' 10.98" (1.803 m)   ??? Wt 66.2 kg (145 lb 15.1 oz)   ??? SpO2 100%   ??? BMI 20.37 kg/m2         Intake/Output Summary (Last 24 hours) at 08/07/16 0911  Last data filed at 08/07/16 0217   Gross per  24 hour   Intake              240 ml   Output              500 ml   Net             -260 ml        Physical Examination:             Constitutional:  male, lying in bed, No acute distress, cooperative, pleasant??   ENT:  Oral mucous moist, oropharynx benign. Neck supple,    Resp:  CTA bilaterally. No wheezing/rhonchi/rales. No accessory muscle use   CV:  Regular rhythm, normal rate, no gallops, rubs    GI:  Soft, non distended, non tender. normoactive bowel sounds    Musculoskeletal:  No edema, warm, 2+ pulses throughout    Neurologic:  Moves all extremities.             Labs:     Recent Labs      08/07/16   0207   WBC  9.9   HGB  11.5*   HCT  33.9*   PLT  446*     Recent Labs      08/07/16   0207   NA  134*   K  3.6   CL  98   CO2  29   BUN  6   CREA  0.70   GLU  109*   CA  9.0       Lab Results   Component Value Date/Time    Glucose (POC) 146 07/30/2016 04:14 PM    Glucose (POC) 119 07/30/2016 10:13 AM     Glucose (POC) 88 12/06/2012 01:34 PM     Lab Results   Component Value Date/Time    Color YELLOW/STRAW 05/09/2014 02:48 PM    Appearance CLEAR 05/09/2014 02:48 PM    Specific gravity 1.020 05/09/2014 02:48 PM    pH (UA) 8.5 05/09/2014 02:48 PM    Protein 30 05/09/2014 02:48 PM    Glucose NEGATIVE  05/09/2014 02:48 PM    Ketone NEGATIVE  05/09/2014 02:48 PM    Bilirubin NEGATIVE  05/09/2014 02:48 PM    Urobilinogen 0.2 05/09/2014 02:48 PM    Nitrites NEGATIVE  05/09/2014 02:48 PM    Leukocyte Esterase NEGATIVE  05/09/2014 02:48 PM    Epithelial cells FEW 05/09/2014 02:48 PM    Bacteria NEGATIVE  05/09/2014 02:48 PM    WBC 0-4 05/09/2014 02:48 PM    RBC 0-5 05/09/2014 02:48 PM                      Fabio PierceKyong Maxine GlennU Vasilios Ottaway, MD

## 2016-08-07 NOTE — Progress Notes (Signed)
Neurosurgery  Doing well  Pain controlled in back  Wound dressed and dry  ??  Awaiting rehab auth Monday?

## 2016-08-08 LAB — CBC W/O DIFF
HCT: 34.1 % — ABNORMAL LOW (ref 36.6–50.3)
HGB: 11.4 g/dL — ABNORMAL LOW (ref 12.1–17.0)
MCH: 30.5 PG (ref 26.0–34.0)
MCHC: 33.4 g/dL (ref 30.0–36.5)
MCV: 91.2 FL (ref 80.0–99.0)
PLATELET: 502 10*3/uL — ABNORMAL HIGH (ref 150–400)
RBC: 3.74 M/uL — ABNORMAL LOW (ref 4.10–5.70)
RDW: 12.4 % (ref 11.5–14.5)
WBC: 9.6 10*3/uL (ref 4.1–11.1)

## 2016-08-08 LAB — METABOLIC PANEL, BASIC
Anion gap: 8 mmol/L (ref 5–15)
BUN/Creatinine ratio: 11 — ABNORMAL LOW (ref 12–20)
BUN: 7 MG/DL (ref 6–20)
CO2: 25 mmol/L (ref 21–32)
Calcium: 8.8 MG/DL (ref 8.5–10.1)
Chloride: 98 mmol/L (ref 97–108)
Creatinine: 0.66 MG/DL — ABNORMAL LOW (ref 0.70–1.30)
GFR est AA: >60 mL/min/{1.73_m2} (ref >60)
GFR est non-AA: >60 mL/min/{1.73_m2} (ref >60)
Glucose: 100 mg/dL (ref 65–100)
Potassium: 4.4 mmol/L (ref 3.5–5.1)
Sodium: 131 mmol/L — ABNORMAL LOW (ref 136–145)

## 2016-08-08 MED ORDER — DIPHENHYDRAMINE 12.5 MG/5 ML ELIXIR
12.5 mg/5 mL | Freq: Four times a day (QID) | ORAL | 0 refills | Status: AC | PRN
Start: 2016-08-08 — End: ?

## 2016-08-08 MED ORDER — MELATONIN 3 MG TAB
3 mg | Freq: Every evening | ORAL | Status: DC | PRN
Start: 2016-08-08 — End: 2016-08-08
  Administered 2016-08-08: 01:00:00 via ORAL

## 2016-08-08 MED ORDER — LACOSAMIDE 200 MG TAB
200 mg | ORAL_TABLET | Freq: Two times a day (BID) | ORAL | 0 refills | Status: AC
Start: 2016-08-08 — End: ?

## 2016-08-08 MED ORDER — SENNOSIDES-DOCUSATE SODIUM 8.6 MG-50 MG TAB
ORAL_TABLET | Freq: Every day | ORAL | 0 refills | Status: AC
Start: 2016-08-08 — End: ?

## 2016-08-08 MED ORDER — DIPHENHYDRAMINE 12.5 MG/5 ML ELIXIR
12.5 mg/5 mL | Freq: Four times a day (QID) | ORAL | 0 refills | Status: DC | PRN
Start: 2016-08-08 — End: 2016-08-08

## 2016-08-08 MED ORDER — OXYCODONE-ACETAMINOPHEN 10 MG-325 MG TAB
10-325 mg | ORAL_TABLET | ORAL | 0 refills | Status: AC | PRN
Start: 2016-08-08 — End: ?

## 2016-08-08 MED ORDER — MELATONIN 3 MG TAB
3 mg | ORAL_TABLET | Freq: Every evening | ORAL | 0 refills | Status: AC | PRN
Start: 2016-08-08 — End: ?

## 2016-08-08 MED ORDER — CYCLOBENZAPRINE 10 MG TAB
10 mg | ORAL_TABLET | Freq: Three times a day (TID) | ORAL | 0 refills | Status: DC | PRN
Start: 2016-08-08 — End: 2016-08-08

## 2016-08-08 MED ORDER — CYCLOBENZAPRINE 10 MG TAB
10 mg | ORAL_TABLET | Freq: Three times a day (TID) | ORAL | 0 refills | Status: AC | PRN
Start: 2016-08-08 — End: ?

## 2016-08-08 MED FILL — OXYCODONE ER 10 MG TABLET,CRUSH RESISTANT,EXTENDED RELEASE 12 HR: 10 mg | ORAL | Qty: 1

## 2016-08-08 MED FILL — VIMPAT 50 MG TABLET: 50 mg | ORAL | Qty: 4

## 2016-08-08 MED FILL — LEVETIRACETAM 250 MG TAB: 250 mg | ORAL | Qty: 1

## 2016-08-08 MED FILL — NORMAL SALINE FLUSH 0.9 % INJECTION SYRINGE: INTRAMUSCULAR | Qty: 10

## 2016-08-08 MED FILL — LORAZEPAM 1 MG TAB: 1 mg | ORAL | Qty: 1

## 2016-08-08 MED FILL — OXYCODONE-ACETAMINOPHEN 10 MG-325 MG TAB: 10-325 mg | ORAL | Qty: 1

## 2016-08-08 MED FILL — FLUARIX QUAD 2017-2018 (PF) 60 MCG (15 MCG X 4)/0.5 ML IM SYRINGE: 60 mcg (15 mcg x 4)/0.5 mL | INTRAMUSCULAR | Qty: 0.5

## 2016-08-08 MED FILL — VENLAFAXINE SR 150 MG 24 HR CAP: 150 mg | ORAL | Qty: 1

## 2016-08-08 MED FILL — FAMOTIDINE 20 MG TAB: 20 mg | ORAL | Qty: 1

## 2016-08-08 MED FILL — TRAZODONE 50 MG TAB: 50 mg | ORAL | Qty: 1

## 2016-08-08 MED FILL — TYLENOL 325 MG TABLET: 325 mg | ORAL | Qty: 2

## 2016-08-08 MED FILL — DOK PLUS 8.6 MG-50 MG TABLET: ORAL | Qty: 1

## 2016-08-08 MED FILL — CYCLOBENZAPRINE 10 MG TAB: 10 mg | ORAL | Qty: 1

## 2016-08-08 NOTE — Progress Notes (Addendum)
NUTRITION- DIETETIC tECHnICIAN    Pt seen for:                         Rescreen                    Food preferences/tolerances                    Food Allergies                    PO intake check                    Supplements                    Diet order clarification                    Education                    Other     Rescreen:                      Not at Nutrition Risk, rescreen per screening protocol                    At Nutrition Risk- RD referral         SUBJECTIVE/OBJECTIVE:     Information obtained from:  patient      Diet:  Regular    Intake: fair    Patient Vitals for the past 100 hrs:   % Diet Eaten   08/06/16 1239 100 %       Weight Changes:  Reports stable weight at 144lbs     Wt Readings from Last 3 Encounters:   08/08/16 65.5 kg (144 lb 6.4 oz)   07/31/16 66.3 kg (146 lb 2.6 oz)   04/16/15 60.5 kg (133 lb 6.1 oz)       Problems Identified:                        Scant po information in chart to review x 7 days.  Patient reports poor appetite and intake with frequent GERD - discussed tips to help prevent reflux following meals.  Lunch delivered while speaking with patient today but he was not inclined to want to eat at present.  He will occasionally drink high protein Boost at home and he is agreeable to try Ensure Enlive (20 grams protein, 350 kcal) twice daily while here to help with kcal/protein intake.  Refused snacks.  Mom/family present to assist and encourage.                     Specified food preferences                     Dislikes supplements                                Allergies:                     Difficulty chewing                        Dentition  Nausea/Vomiting                     Constipation                     Diarrhea    PLAN:                        Continue current diet and encourage intake                     Add Ensure Enlive x 2 daily                     Dislikes supplements will try a substitution                      Modify diet for food allergies                     Adjust texture due to difficulty chewing                      Educated patient                     RD Referral                     Rescreen per screening protocol          Normajean GlasgowKathryn F Londen Lorge, DTR

## 2016-08-08 NOTE — Discharge Summary (Signed)
Discharge Summary    Patient: Carl Howell               Sex: male          DOA: 07/30/2016         Date of Birth:  09-01-1976      Age:  40 y.o.        LOS:  LOS: 9 days                Admit Date: 07/30/2016    Discharge Date: 08/08/2016    Admission Diagnoses: subdural bleed  Subdural hematoma (HCC)  UNKNOWN    Discharge Diagnoses:    Problem List as of 08/08/2016  Date Reviewed: 08/03/16          Codes Class Noted - Resolved    Closed unstable burst fracture of fifth thoracic vertebra (HCC) ICD-10-CM: S22.052A  ICD-9-CM: 805.2  08/01/2016 - Present        Glioma (HCC) ICD-10-CM: C71.9  ICD-9-CM: 191.1  Aug 03, 2016 - Present        Seizures (HCC) ICD-10-CM: R56.9  ICD-9-CM: 780.39  07/30/2016 - Present        * (Principal)Subdural hematoma (HCC) ICD-10-CM: I62.00  ICD-9-CM: 432.1  07/30/2016 - Present        Epilepsy (HCC) ICD-10-CM: G40.909  ICD-9-CM: 345.90  Unknown - Present              Discharge Medications:     Current Discharge Medication List      START taking these medications    Details   cyclobenzaprine (FLEXERIL) 10 mg tablet Take 1 Tab by mouth three (3) times daily as needed for Muscle Spasm(s).  Qty: 30 Tab, Refills: 0      diphenhydrAMINE (BENADRYL) 12.5 mg/5 mL elixir Take 5 mL by mouth every six (6) hours as needed.  Qty: 50 mL, Refills: 0      melatonin 3 mg tablet Take 1 Tab by mouth nightly as needed.  Qty: 10 Tab, Refills: 0      oxyCODONE-acetaminophen (PERCOCET 10) 10-325 mg per tablet Take 1 Tab by mouth every four (4) hours as needed. Max Daily Amount: 6 Tabs.  Qty: 10 Tab, Refills: 0      senna-docusate (PERICOLACE) 8.6-50 mg per tablet Take 1 Tab by mouth daily.  Qty: 10 Tab, Refills: 0         CONTINUE these medications which have CHANGED    Details   lacosamide (VIMPAT) 200 mg tab tablet Take 1 Tab by mouth every twelve (12) hours. Max Daily Amount: 400 mg.  Qty: 30 Tab, Refills: 0         CONTINUE these medications which have NOT CHANGED    Details    venlafaxine-SR (EFFEXOR XR) 150 mg capsule Take 150 mg by mouth daily.      ACETAMINOPHEN/DIPHENHYDRAMINE (TYLENOL PM PO) Take 2 Tabs by mouth nightly as needed for Other (Sleep).      cholecalciferol (VITAMIN D3) 1,000 unit tablet Take 2,000 Units by mouth daily.      levETIRAcetam (KEPPRA) 500 mg tablet Take 1,750 mg by mouth two (2) times a day.      LORazepam (ATIVAN) 1 mg tablet Take 1 mg by mouth three (3) times daily as needed for Anxiety.      DANDELION PO Take 1,040 mg by mouth daily. Takes 2 capsules (520 mg each) daily      ASHWAGANDHA ROOT EXTRACT,BULK, Take 500 mg by mouth daily. Takes 2 capsules (  250 mg each) daily         STOP taking these medications       raNITIdine (ZANTAC) 150 mg tablet Comments:   Reason for Stopping:               Follow-up:pcp and neurosurgery    Discharge Condition:good    Activity:as tolerated    Diet:regular    Labs:  Labs: Results:       Chemistry Recent Labs      08/08/16   0234  08/07/16   0207   GLU  100  109*   NA  131*  134*   K  4.4  3.6   CL  98  98   CO2  25  29   BUN  7  6   CREA  0.66*  0.70   CA  8.8  9.0   AGAP  8  7   BUCR  11*  9*      CBC w/Diff Recent Labs      08/08/16   0401  08/07/16   0207   WBC  9.6  9.9   RBC  3.74*  3.70*   HGB  11.4*  11.5*   HCT  34.1*  33.9*   PLT  502*  446*   GRANS   --   77*   LYMPH   --   12   EOS   --   2      Cardiac Enzymes No results for input(s): CPK, CKND1, MYO in the last 72 hours.    No lab exists for component: CKRMB, TROIP   Coagulation No results for input(s): PTP, INR, APTT in the last 72 hours.    No lab exists for component: INREXT    Lipid Panel No results found for: CHOL, CHOLPOCT, CHOLX, CHLST, CHOLV, 884269, HDL, LDL, LDLC, DLDLP, 161096, VLDLC, VLDL, TGLX, TRIGL, TRIGP, TGLPOCT, CHHD, CHHDX   BNP No results for input(s): BNPP in the last 72 hours.   Liver Enzymes No results for input(s): TP, ALB, TBIL, AP, SGOT, GPT in the last 72 hours.    No lab exists for component: DBIL    Thyroid Studies No results found for: T4, T3U, TSH, TSHEXT       Imaging:  CT head on 9/16:  Acute right subdural hematoma measuring up to 5 mm in thickness. No significant  midline shift or mass effect. No acute hydrocephalus.    CT head on 9/19:  IMPRESSION: Right parietal craniotomy with right parietal encephalomalacia and  small amount of subdural hemorrhage as described above, unchanged.    CXR 9/23:????  IMPRESSION: 1. The ventricles are stable. Right parietal encephalomalacia is  stable.  2. The subdural in the tentorium right greater than left extending into the  posterior mid interhemispheric fissure is stable. Tiny amount of subdural  anterior interhemispheric fissure and anterior to the right frontal lobe is  stable.  3. 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 Per mother at bedside, the patient used to obtain all his care at   [facility]; however, his insurance changed and [facility] no longer accepts   [place], and the patient is now getting all his care at [facility]. Per   mother, around 9:00 this morning, the patient fell down the stairs about   8 feet in height and she found him seizing for about 5 minutes,   including tonic-clonic movements and tongue biting. Per mother, the    seizures are not unusual. He takes Vimpat and Keppra and takes all   his medications as prescribed. He usually has a prolonged postictal   state of several hours, 8 to 9 hours, but today it seems to be more   prolonged than usual. Immediately after the fall, the patient was   transferred to the nearest ED where a CT scan of the head revealed a right subdural   hematoma measuring up to 5 mm in thickness with a slight mass effect   and leftward midline shift of 3 mm. The patient was evaluated by   Neurosurgery and transferred to [facility] for further care. No history   is obtainable from the patient due to confusion and postictal state.   History was obtained from chart review and the patient's mother, with   whom he lives.   ??  The patient is compliant with medications and there is no report of   chest pain, shortness of breath, palpitations, fevers, recent illnesses,   recent hospitalization, urinary symptoms, or any other complaints.  ??  Hospital Course:  40yo male with a right parietal subdural hematoma, breakthrough seizure from epilepsy, and vertebral FXR of 5th thoracic vertebrae s/p T5 laminectomy with post fusion at T3-T7.  ??  PROBLEM LIST:   1. Right parietal subdural hematoma  2. Vertebral FXR of 5th thoracic vertebrae  3. Epilepsy with breakthrough seizure  4. Oligoastrocytoma  5. Depression  6. Chronic tobacco/nicotine usage  ??  Neurology and Neurosurgery recommendations have been pain mgmt and to observe subdural hematoma.Repeated CT head were done and showed unchanged subdural hematoma.Patient had thoracic 5 laminectomy,decompression, posterolateral instrumented fusion, thoracic 3 through  thoracic 7 with posterior pedicle screws using the Medtronic Solera system instrumentation and posterolateral allograft using Medtronic MagniFuse and Progenix Plus.Neurology recommendation regarding seizure was to continue with Vimpat and Keppra.Vimpat dose was increased to 200 mg po bid.Patient also  received lorazepam q 12 prn for left sided twitching.Pt has been counseled on the importance of smoking/tobacco cessation during hospital stay.He was on nicoderm patch.Patient has back pain controlled and has been cleared for discharge by neurosurgery.He will follow-up with primary neurologist or neurosurgery.He will be discharged to Rehab.  ??  Patient is clinically stable for discharge    Time for discharge:40 minutes.    Darnelle Catalan, MD  August 08, 2016

## 2016-08-08 NOTE — Progress Notes (Signed)
Cm received a call from [place] at [facility] stating that she has obtained authorization for this pt to be admitted there today and they have an available bed for this pt. Cm met with pt and his mother to inform them. They are in agreement with plan. CM set up ambulance transport for this pt to go to [place] today for 4:30pm. CM will fax d/c information to [place]. An envelope containing pt's kardex, MARs, AVS and emtala will be sent with pt to [facility]. Also, pt's nurse will call report. Vaughan Basta Garren,BSW,ACM-SW

## 2016-08-08 NOTE — Progress Notes (Signed)
cdmp query:Metabolic Encephalopathy in the setting of Agitation, restlessness, belligerent to staff requiring increased Safely measures ie room close to nurses stat ion, bed alarm, and Ativan IV .

## 2016-08-08 NOTE — Other (Shared)
IDR/SLIDR Summary          Patient: Carl Howell MRN: [id number]    Age: 40 y.o.     Birthdate: 04/11/1976 Room/Bed: 662/01   Admit Diagnosis: subdural bleed  Subdural hematoma (HCC)  UNKNOWN  Principal Diagnosis: Subdural hematoma (HCC)   Goals: discharge  Readmission: NO  Quality Measure: SCIP  VTE Prophylaxis: Mechanical  Influenza Vaccine screening completed? YES  Pneumococcal Vaccine screening completed? NO  Mobility needs: Yes, No   Nutrition plan:Yes  Consults:P.T, O.T. and Case Management    Financial concerns:Yes  Escalated to CM? YES  RRAT Score: 3   Interventions:Home Health  Testing due for pt today? NO  LOS: 9 days Expected length of stay 6-7 days  Discharge plan: Pending   PCP: Tommi Rumpsasha Dickerson, MD  Transportation needs: Yes    Days before discharge:one day until discharge   Discharge disposition: Rehab    Signed:     Luvenia StarchCallie C Freeman  08/08/2016  5:19 AM

## 2016-08-08 NOTE — Progress Notes (Signed)
Report call to [facility]. Patient will be transported via ambulance around 430. Patient's mom at bedside.    Bedside shift change report given to Verdon CumminsJesse, RN (oncoming nurse) by Marzetta Boardaiwo, RN  (offgoing nurse). Report included the following information SBAR, Kardex, Intake/Output, Recent Results and Cardiac Rhythm NSR.

## 2016-08-08 NOTE — Progress Notes (Signed)
Bedside and Verbal shift change report given to [name], RN (oncoming nurse) by Carmie Kanner RN (offgoing nurse). Report included the following information SBAR, Kardex, Intake/Output, MAR, Accordion and Recent Results.

## 2016-08-08 NOTE — Progress Notes (Signed)
Problem: Mobility Impaired (Adult and Pediatric)  Goal: *Acute Goals and Plan of Care (Insert Text)  Physical Therapy Goals  Weekly reassessment completed 9/25. Goals updated.   1. Patient will move from supine to sit and sit to supine and scoot up and down in bed with Mod I within 7 day(s).   2. Patient will transfer from bed to chair and chair to bed with supervision/set-up using the least restrictive device within 7 day(s).  3. Patient will perform sit to stand with supervision/set-up within 4 day(s).  4. Patient will ambulate with standby assist for 150 feet with the least restrictive device within 7 day(s).   5. Patient will ascend/descend 3 stairs with no handrail(s) with minimal assistance/contact guard assist within 4 day(s).  6. Patient will improve Berg Balance score by 7 points within 7 days.    Initiated 08/03/2016  1. Patient will move from supine to sit and sit to supine and scoot up and down in bed with supervision/set-up within 4 day(s).   2. Patient will transfer from bed to chair and chair to bed with supervision/set-up using the least restrictive device within 4 day(s).  3. Patient will perform sit to stand with supervision/set-up within 4 day(s).  4. Patient will ambulate with minimal assistance/contact guard assist for 150 feet with the least restrictive device within 4 day(s).   5. Patient will ascend/descend 3 stairs with no handrail(s) with minimal assistance/contact guard assist within 4 day(s).  6. Patient will improve Berg Balance score by 7 points within 7 days.   PHYSICAL THERAPY TREATMENT: WEEKLY REASSESSMENT  Patient: Carl Howell (40 y.o. male)  Date: 08/08/2016  Diagnosis: subdural bleed  Subdural hematoma (HCC)  UNKNOWN Subdural hematoma (HCC)  Procedure(s) (LRB):  T5 LAMINECTOMY WITH POSTERIOR INSTRUMENTATION AND FUSION AT T3-8 (N/A) 6 Days Post-Op  Precautions: Fall, Back      ASSESSMENT:  Chart reviewed, cleared for progressive mobilization by RN. Pt reports  pending discharge today to rehab and his motivated to begin "crushing it in rehab". Note marked improvement in L UE edema, strength, gross motor coordination, and integration with functional tasks as compared to initial evaluation. He remains limited by poor LE coordination and strength (L<R), impulsiveness, and limited insight to deficits and safety leading to continued falls risk. He was able to recall 2/3 spinal precautions with increased time and prompting, reviewed all 3. He was able to mobilize to EOB with CGA via log roll sequencing; gait training progressed to approx 172f with RW and min A - frequent cues for increased L step length, AD proximity, and increased L foot clearance - continue to note NBOS, frequent L scissor step, ataxia, and poor stability/control however is much improved as compared to previous sessions. Completed sit<>stands x4 reps with emphasis on establishing BOS, achieving adequate fwd weight shift, proper hand placement sequencing, and obtaining immediate standing balance.  Recommend discharge to acute IP rehab as pt is motivated and can tolerate 3hrs of therapy.      Patient's progression toward goals since last assessment: Goals met and upgraded accordingly.        PLAN:  Goals have been updated based on progression since last assessment.  Patient continues to benefit from skilled intervention to address the above impairments.  Continue to follow the patient daily to address goals.  Planned Interventions:  _0               Bed Mobility Training             [  X]       Neuromuscular Re-Education  _0               Transfer Training                   _1        Orthotic/Prosthetic Training  _2               Gait Training                         _3        Modalities  _4               Therapeutic Exercises           _5        Edema Management/Control  _6               Therapeutic Activities            _7        Patient and Family Training/Education  _8               Other (comment):   Discharge Recommendations: Inpatient Rehab  Further Equipment Recommendations for Discharge: TBD       SUBJECTIVE:   Patient stated ???I'm ready to start crushing it in rehab.???      OBJECTIVE DATA SUMMARY:   Critical Behavior:  Neurologic State: Alert  Orientation Level: Oriented to person, Oriented to place, Oriented to situation  Cognition: Memory loss  Safety/Judgement: Decreased awareness of environment, Decreased awareness of need for assistance, Decreased insight into deficits     Strength:   Strength: Generally decreased, functional (L UE 4/5)        Functional Mobility Training:  Bed Mobility:  Rolling: Supervision;Assist x1  Supine to Sit: Contact guard assistance  Sit to Supine: Contact guard assistance     Transfers:  Sit to Stand: Minimum assistance  Stand to Sit: Minimum assistance     Balance:  Sitting: Impaired  Sitting - Static: Fair (occasional)  Sitting - Dynamic: Poor (constant support)  Ambulation/Gait Training:  Distance (ft): 125 Feet (ft)  Assistive Device: Gait belt;Walker, rolling  Ambulation - Level of Assistance: Minimal assistance  Gait Description (WDL): Exceptions to WDL  Gait Abnormalities: Ataxic;Decreased step clearance;Path deviations;Scissoring;Trunk sway increased  Base of Support: Narrowed  Speed/Cadence: Accelerated;Shuffled  Step Length: Left shortened           Pain:  Pain Scale 1: Numeric (0 - 10)  Pain Intensity 1: 3           Pain Intervention(s) 1: Medication (see MAR);Warm pack  Activity Tolerance:   VSS  Please refer to the flowsheet for vital signs taken during this treatment.  After treatment:   _9   Patient left in no apparent distress sitting up in chair  _10   Patient left in no apparent distress in bed  _11   Call bell left within reach  _12   Nursing notified  _13   Caregiver present  _14   Bed alarm activated      COMMUNICATION/COLLABORATION:   The patient???s plan of care was discussed with: Registered Nurse     Raye Sorrow, PT , DPT   Time Calculation: 17 mins

## 2016-08-08 NOTE — Progress Notes (Signed)
Problem: Self Care Deficits Care Plan (Adult)  Goal: *Acute Goals and Plan of Care (Insert Text)  Occupational Therapy Goals  Initiated 08/05/2016  1. Patient will compete grooming standing with CG within 7 days.  2. Patient will complete bathing with CG within 7 days.  3. Patient will complete upper body dressing with CG within 7 days.  4. Patient will complete min A for lower body dressing with CG within 7 days.  5. Patient will complete toilet transfer with min A within 7 days.   6. Patient will complete toietling with min A within 7 days.    OCCUPATIONAL THERAPY TREATMENT  Patient: Carl Howell ([age] y.o. male)  Date: 08/08/2016  Diagnosis: subdural bleed  Subdural hematoma (HCC)  UNKNOWN Subdural hematoma (HCC)  Procedure(s) (LRB):  T5 LAMINECTOMY WITH POSTERIOR INSTRUMENTATION AND FUSION AT T3-8 (N/A) 6 Days Post-Op  Precautions: Fall, Back  Chart, occupational therapy assessment, plan of care, and goals were reviewed.      ASSESSMENT:  Based on the objective data below, the patient participated in instruction in back precautions, instruction in application of precautions to self care and mobility, bed mobility, UE dressing, and left UE AROM and coordination with min to max assist. Patient able to recall 1/3 back precautions at start of session and 2/3 at end of session with cues.  Performance impacted by impaired AROM/coordination/motor planning of left UE, impaired memory, impaired sitting balance, and need for assist for all mobility and self care. Recommend Inpatient rehab.  Recommend next visit: self care seated on EOB.  Progression toward goals:  [X]           Improving appropriately and progressing toward goals  [ ]           Improving slowly and progressing toward goals  [ ]           Not making progress toward goals and plan of care will be adjusted       PLAN:  Patient continues to benefit from skilled intervention to address the above impairments.  Continue treatment per established plan of care.   Discharge Recommendations:  Inpatient Rehab  Further Equipment Recommendations for Discharge:  none       SUBJECTIVE:   Patient stated ???No stretching.??? patient repeats as one of his precautions.      OBJECTIVE DATA SUMMARY:   Cognitive/Behavioral Status:  Neurologic State: Alert  Orientation Level: Oriented to person;Oriented to place;Oriented to situation  Cognition: Memory loss  Perception: Cues to attend to left side of body;Cues to maintain midline in sitting  Perseveration: No perseveration noted  Safety/Judgement: Decreased awareness of environment;Decreased awareness of need for assistance;Decreased insight into deficits  Functional Mobility and Transfers for ADLs:              Bed Mobility:  Rolling: Supervision;Assist x1  Supine to Sit: Moderate assistance;Assist x1  Sit to Supine: Minimum assistance;Assist x1                   Transfers:           Balance:  Sitting: Impaired  Sitting - Static: Fair (occasional)  Sitting - Dynamic: Poor (constant support)  ADL Intervention:           Upper Body Dressing Assistance  Dressing Assistance: Moderate assistance (performed at EOB)  Pullover Shirt: Moderate assistance (seated EOB for balance)  Cues: Physical assistance;Verbal cues provided     Lower Body Dressing Assistance  Dressing Assistance: Maximum assistance (inferred)  Leg Crossed Method  Used: Yes  Position Performed: Seated edge of bed;Standing           Cognitive Retraining  Problem Solving: Identifying the problem;General alternative solution  Organizing/Sequencing: Breaking task down  Attention to Task: Distractibility  Following Commands: Follows one step commands/directions  Safety/Judgement: Decreased awareness of environment;Decreased awareness of need for assistance;Decreased insight into deficits  Cues: Tactile cues provided;Verbal cues provided;Visual cues provided     Neuro Re-Education:           Seated static and dynamic balance, coordination of left UE     Activity Tolerance:    Fair   Please refer to the flowsheet for vital signs taken during this treatment.  After treatment:   [ ]   Patient left in no apparent distress sitting up in chair  [X]   Patient left in no apparent distress in bed  [X]   Call bell left within reach  [X]   Nursing notified  [ ]   Caregiver present  [X]   Bed alarm activated      COMMUNICATION/COLLABORATION:   The patient???s plan of care was discussed with: Registered Nurse     Albin Fellingobin R Wetzel, COTA  Time Calculation: 18 mins

## 2016-08-08 NOTE — Discharge Summary (Signed)
Discharge Summary    Patient: Carl Howell               Sex: male          DOA: 07/30/2016         Date of Birth:  1976-02-26      Age:  40 y.o.        LOS:  LOS: 9 days                Admit Date: 07/30/2016    Discharge Date: 08/08/2016    Admission Diagnoses: subdural bleed  Subdural hematoma (HCC)  UNKNOWN    Discharge Diagnoses:    Problem List as of 08/08/2016  Date Reviewed: 08-12-16          Codes Class Noted - Resolved    Closed unstable burst fracture of fifth thoracic vertebra (HCC) ICD-10-CM: S22.052A  ICD-9-CM: 805.2  08/01/2016 - Present        Glioma (HCC) ICD-10-CM: C71.9  ICD-9-CM: 191.1  08-12-2016 - Present        Seizures (HCC) ICD-10-CM: R56.9  ICD-9-CM: 780.39  07/30/2016 - Present        * (Principal)Subdural hematoma (HCC) ICD-10-CM: I62.00  ICD-9-CM: 432.1  07/30/2016 - Present        Epilepsy (HCC) ICD-10-CM: G40.909  ICD-9-CM: 345.90  Unknown - Present              Discharge Medications:     Current Discharge Medication List      START taking these medications    Details   cyclobenzaprine (FLEXERIL) 10 mg tablet Take 1 Tab by mouth three (3) times daily as needed for Muscle Spasm(s).  Qty: 30 Tab, Refills: 0      diphenhydrAMINE (BENADRYL) 12.5 mg/5 mL elixir Take 5 mL by mouth every six (6) hours as needed.  Qty: 50 mL, Refills: 0      melatonin 3 mg tablet Take 1 Tab by mouth nightly as needed.  Qty: 10 Tab, Refills: 0      oxyCODONE-acetaminophen (PERCOCET 10) 10-325 mg per tablet Take 1 Tab by mouth every four (4) hours as needed. Max Daily Amount: 6 Tabs.  Qty: 10 Tab, Refills: 0      senna-docusate (PERICOLACE) 8.6-50 mg per tablet Take 1 Tab by mouth daily.  Qty: 10 Tab, Refills: 0         CONTINUE these medications which have CHANGED    Details   lacosamide (VIMPAT) 200 mg tab tablet Take 1 Tab by mouth every twelve (12) hours. Max Daily Amount: 400 mg.  Qty: 30 Tab, Refills: 0         CONTINUE these medications which have NOT CHANGED    Details   venlafaxine-SR (EFFEXOR XR) 150 mg  capsule Take 150 mg by mouth daily.      ACETAMINOPHEN/DIPHENHYDRAMINE (TYLENOL PM PO) Take 2 Tabs by mouth nightly as needed for Other (Sleep).      cholecalciferol (VITAMIN D3) 1,000 unit tablet Take 2,000 Units by mouth daily.      levETIRAcetam (KEPPRA) 500 mg tablet Take 1,750 mg by mouth two (2) times a day.      LORazepam (ATIVAN) 1 mg tablet Take 1 mg by mouth three (3) times daily as needed for Anxiety.      DANDELION PO Take 1,040 mg by mouth daily. Takes 2 capsules (520 mg each) daily      ASHWAGANDHA ROOT EXTRACT,BULK, Take 500 mg by mouth daily. Takes 2 capsules (  250 mg each) daily         STOP taking these medications       raNITIdine (ZANTAC) 150 mg tablet Comments:   Reason for Stopping:               Follow-up:pcp and neurosurgery    Discharge Condition:good    Activity:as tolerated    Diet:regular    Labs:  Labs: Results:       Chemistry Recent Labs      08/08/16   0234  08/07/16   0207   GLU  100  109*   NA  131*  134*   K  4.4  3.6   CL  98  98   CO2  25  29   BUN  7  6   CREA  0.66*  0.70   CA  8.8  9.0   AGAP  8  7   BUCR  11*  9*      CBC w/Diff Recent Labs      08/08/16   0401  08/07/16   0207   WBC  9.6  9.9   RBC  3.74*  3.70*   HGB  11.4*  11.5*   HCT  34.1*  33.9*   PLT  502*  446*   GRANS   --   77*   LYMPH   --   12   EOS   --   2      Cardiac Enzymes No results for input(s): CPK, CKND1, MYO in the last 72 hours.    No lab exists for component: CKRMB, TROIP   Coagulation No results for input(s): PTP, INR, APTT in the last 72 hours.    No lab exists for component: INREXT    Lipid Panel No results found for: CHOL, CHOLPOCT, CHOLX, CHLST, CHOLV, 884269, HDL, LDL, LDLC, DLDLP, 161096, VLDLC, VLDL, TGLX, TRIGL, TRIGP, TGLPOCT, CHHD, CHHDX   BNP No results for input(s): BNPP in the last 72 hours.   Liver Enzymes No results for input(s): TP, ALB, TBIL, AP, SGOT, GPT in the last 72 hours.    No lab exists for component: DBIL   Thyroid Studies No results found for: T4, T3U, TSH, TSHEXT        Imaging:  CT head on 9/16:  Acute right subdural hematoma measuring up to 5 mm in thickness. No significant  midline shift or mass effect. No acute hydrocephalus.    CT head on 9/19:  IMPRESSION: Right parietal craniotomy with right parietal encephalomalacia and  small amount of subdural hemorrhage as described above, unchanged.    CXR 9/23:????  IMPRESSION: 1. The ventricles are stable. Right parietal encephalomalacia is  stable.  2. The subdural in the tentorium right greater than left extending into the  posterior mid interhemispheric fissure is stable. Tiny amount of subdural  anterior interhemispheric fissure and anterior to the right frontal lobe is  stable.  3. 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 Per mother at bedside, the patient [facility]; however, his insurance changed and [facility] no longer accepts   [place], and the patient is now getting all his care at [facility]. Per   mother, around 9:00 this morning, the patient fell down the stairs about   8 feet in height and she found him seizing for about 5 minutes,   including tonic-clonic movements and tongue biting. Per mother, the   seizures are not unusual. He takes Vimpat and Keppra and takes all   his  medications as prescribed. He usually has a prolonged postictal   state of several hours, 8 to 9 hours, but today it seems to be more   prolonged than usual. Immediately after the fall, the patient was   transferred to the nearest ED where a CT scan of the head revealed a right subdural   hematoma measuring up to 5 mm in thickness with a slight mass effect   and leftward midline shift of 3 mm. The patient was evaluated by   Neurosurgery and transferred to [facility] for [facility] care. No history   is obtainable from the patient due to confusion and postictal state.   History was obtained from chart review and the patient's mother, with   whom he lives.   ??  The patient is compliant with medications and there is no report of   chest pain, shortness of breath, palpitations, fevers, recent illnesses,   recent hospitalization, urinary symptoms, or any other complaints.  ??  Hospital Course:  40yo male with a right parietal subdural hematoma, breakthrough seizure from epilepsy, and vertebral FXR of 5th thoracic vertebrae s/p T5 laminectomy with post fusion at T3-T7.  ??  PROBLEM LIST:   1. Right parietal subdural hematoma  2. Vertebral FXR of 5th thoracic vertebrae  3. Epilepsy with breakthrough seizure  4. Oligoastrocytoma  5. Depression  6. Chronic tobacco/nicotine usage  ??  Neurology and Neurosurgery recommendations have been pain mgmt and to observe subdural hematoma.Repeated CT head were done and showed unchanged subdural hematoma.Patient had thoracic 5 laminectomy,decompression, posterolateral instrumented fusion, thoracic 3 through  thoracic 7 with posterior pedicle screws using the Medtronic Solera system instrumentation and posterolateral allograft using Medtronic MagniFuse and Progenix Plus.Neurology recommendation regarding seizure was to continue with Vimpat and Keppra.Vimpat dose was increased to 200 mg po bid.Patient also received lorazepam q 12 prn for left sided twitching.Pt has been counseled on the  importance of smoking/tobacco cessation during hospital stay.He was on nicoderm patch.Patient has back pain controlled and has been cleared for discharge by neurosurgery.He will follow-up with primary neurologist or neurosurgery.He will be discharged to Rehab.  ??  Patient is clinically stable for discharge    Time for discharge:40 minutes.    Darnelle CatalanNsekenene Rica Heather, MD  August 08, 2016

## 2016-08-30 ENCOUNTER — Encounter: Attending: Neurology | Primary: Family Medicine

## 2016-09-26 ENCOUNTER — Encounter: Attending: Neurology | Primary: Family Medicine

## 2018-08-11 ENCOUNTER — Inpatient Hospital Stay
Admit: 2018-08-11 | Discharge: 2018-08-12 | Disposition: A | Discharge status: Home / Self Care | Payer: MEDICARE | Attending: Emergency Medicine

## 2018-08-11 ENCOUNTER — Emergency Department: Admit: 2018-08-12 | Payer: MEDICARE | Primary: Family Medicine

## 2018-08-11 DIAGNOSIS — R112 Nausea with vomiting, unspecified: Secondary | ICD-10-CM

## 2018-08-11 LAB — CBC WITH AUTOMATED DIFF
ABS. BASOPHILS: 0.1 10*3/uL (ref 0.0–0.1)
ABS. EOSINOPHILS: 0 10*3/uL (ref 0.0–0.4)
ABS. IMM. GRANS.: 0.1 10*3/uL — ABNORMAL HIGH (ref 0.00–0.04)
ABS. LYMPHOCYTES: 1.8 10*3/uL (ref 0.8–3.5)
ABS. MONOCYTES: 0.7 10*3/uL (ref 0.0–1.0)
ABS. NEUTROPHILS: 16 10*3/uL — ABNORMAL HIGH (ref 1.8–8.0)
ABSOLUTE NRBC: 0 10*3/uL (ref 0.00–0.01)
BASOPHILS: 0 % (ref 0–1)
EOSINOPHILS: 0 % (ref 0–7)
HCT: 44.8 % (ref 36.6–50.3)
HGB: 15.4 g/dL (ref 12.1–17.0)
IMMATURE GRANULOCYTES: 0 % (ref 0.0–0.5)
LYMPHOCYTES: 10 % — ABNORMAL LOW (ref 12–49)
MCH: 31.5 PG (ref 26.0–34.0)
MCHC: 34.4 g/dL (ref 30.0–36.5)
MCV: 91.6 FL (ref 80.0–99.0)
MONOCYTES: 4 % — ABNORMAL LOW (ref 5–13)
MPV: 9.9 FL (ref 8.9–12.9)
NEUTROPHILS: 86 % — ABNORMAL HIGH (ref 32–75)
NRBC: 0 PER 100 WBC
PLATELET: 416 10*3/uL — ABNORMAL HIGH (ref 150–400)
RBC: 4.89 M/uL (ref 4.10–5.70)
RDW: 11.9 % (ref 11.5–14.5)
WBC: 18.7 10*3/uL — ABNORMAL HIGH (ref 4.1–11.1)

## 2018-08-11 LAB — METABOLIC PANEL, COMPREHENSIVE
A-G Ratio: 1.1 (ref 1.1–2.2)
ALT (SGPT): 34 U/L (ref 12–78)
AST (SGOT): 29 U/L (ref 15–37)
Albumin: 4.7 g/dL (ref 3.5–5.0)
Alk. phosphatase: 87 U/L (ref 45–117)
Anion gap: 9 mmol/L (ref 5–15)
BUN/Creatinine ratio: 5 — ABNORMAL LOW (ref 12–20)
BUN: 7 MG/DL (ref 6–20)
Bilirubin, total: 0.9 MG/DL (ref 0.2–1.0)
CO2: 21 mmol/L (ref 21–32)
Calcium: 9.4 MG/DL (ref 8.5–10.1)
Chloride: 108 mmol/L (ref 97–108)
Creatinine: 1.38 MG/DL — ABNORMAL HIGH (ref 0.70–1.30)
GFR est AA: >60 mL/min/{1.73_m2} (ref >60)
GFR est non-AA: 57 mL/min/{1.73_m2} — ABNORMAL LOW (ref >60)
Globulin: 4.1 g/dL — ABNORMAL HIGH (ref 2.0–4.0)
Glucose: 131 mg/dL — ABNORMAL HIGH (ref 65–100)
Potassium: 4.2 mmol/L (ref 3.5–5.1)
Protein, total: 8.8 g/dL — ABNORMAL HIGH (ref 6.4–8.2)
Sodium: 138 mmol/L (ref 136–145)

## 2018-08-11 LAB — COMPREHENSIVE METABOLIC PANEL
ALT: 34 U/L (ref 12–78)
AST: 29 U/L (ref 15–37)
Albumin/Globulin Ratio: 1.1 (ref 1.1–2.2)
Albumin: 4.7 g/dL (ref 3.5–5.0)
Alkaline Phosphatase: 87 U/L (ref 45–117)
Anion Gap: 9 mmol/L (ref 5–15)
BUN: 7 MG/DL (ref 6–20)
Bun/Cre Ratio: 5 — ABNORMAL LOW (ref 12–20)
CO2: 21 mmol/L (ref 21–32)
Calcium: 9.4 MG/DL (ref 8.5–10.1)
Chloride: 108 mmol/L (ref 97–108)
Creatinine: 1.38 MG/DL — ABNORMAL HIGH (ref 0.70–1.30)
EGFR IF NonAfrican American: 57 mL/min/{1.73_m2} — ABNORMAL LOW (ref >60)
GFR African American: >60 mL/min/{1.73_m2} (ref >60)
Globulin: 4.1 g/dL — ABNORMAL HIGH (ref 2.0–4.0)
Glucose: 131 mg/dL — ABNORMAL HIGH (ref 65–100)
Potassium: 4.2 mmol/L (ref 3.5–5.1)
Sodium: 138 mmol/L (ref 136–145)
Total Bilirubin: 0.9 MG/DL (ref 0.2–1.0)
Total Protein: 8.8 g/dL — ABNORMAL HIGH (ref 6.4–8.2)

## 2018-08-11 LAB — CBC WITH AUTO DIFFERENTIAL
Basophils %: 0 % (ref 0–1)
Basophils Absolute: 0.1 10*3/uL (ref 0.0–0.1)
Eosinophils %: 0 % (ref 0–7)
Eosinophils Absolute: 0 10*3/uL (ref 0.0–0.4)
Granulocyte Absolute Count: 0.1 10*3/uL — ABNORMAL HIGH (ref 0.00–0.04)
Hematocrit: 44.8 % (ref 36.6–50.3)
Hemoglobin: 15.4 g/dL (ref 12.1–17.0)
Immature Granulocytes: 0 % (ref 0.0–0.5)
Lymphocytes %: 10 % — ABNORMAL LOW (ref 12–49)
Lymphocytes Absolute: 1.8 10*3/uL (ref 0.8–3.5)
MCH: 31.5 PG (ref 26.0–34.0)
MCHC: 34.4 g/dL (ref 30.0–36.5)
MCV: 91.6 FL (ref 80.0–99.0)
MPV: 9.9 FL (ref 8.9–12.9)
Monocytes %: 4 % — ABNORMAL LOW (ref 5–13)
Monocytes Absolute: 0.7 10*3/uL (ref 0.0–1.0)
NRBC Absolute: 0 10*3/uL (ref 0.00–0.01)
Neutrophils %: 86 % — ABNORMAL HIGH (ref 32–75)
Neutrophils Absolute: 16 10*3/uL — ABNORMAL HIGH (ref 1.8–8.0)
Nucleated RBCs: 0 PER 100 WBC
Platelets: 416 10*3/uL — ABNORMAL HIGH (ref 150–400)
RBC: 4.89 M/uL (ref 4.10–5.70)
RDW: 11.9 % (ref 11.5–14.5)
WBC: 18.7 10*3/uL — ABNORMAL HIGH (ref 4.1–11.1)

## 2018-08-11 MED ORDER — ONDANSETRON (PF) 4 MG/2 ML INJECTION
4 mg/2 mL | INTRAMUSCULAR | Status: AC
Start: 2018-08-11 — End: 2018-08-11
  Administered 2018-08-11: 22:00:00 via INTRAVENOUS

## 2018-08-11 MED ORDER — LORAZEPAM 2 MG/ML IJ SOLN
2 mg/mL | INTRAMUSCULAR | Status: AC
Start: 2018-08-11 — End: 2018-08-11
  Administered 2018-08-11: 22:00:00 via INTRAVENOUS

## 2018-08-11 MED ORDER — ONDANSETRON (PF) 4 MG/2 ML INJECTION
4 mg/2 mL | INTRAMUSCULAR | Status: AC
Start: 2018-08-11 — End: 2018-08-11
  Administered 2018-08-11: 21:00:00 via INTRAVENOUS

## 2018-08-11 MED ORDER — SODIUM CHLORIDE 0.9% BOLUS IV
0.9 % | Freq: Once | INTRAVENOUS | Status: AC
Start: 2018-08-11 — End: 2018-08-11
  Administered 2018-08-11: 22:00:00 via INTRAVENOUS

## 2018-08-11 MED FILL — ONDANSETRON (PF) 4 MG/2 ML INJECTION: 4 mg/2 mL | INTRAMUSCULAR | Qty: 2

## 2018-08-11 MED FILL — SODIUM CHLORIDE 0.9 % IV: INTRAVENOUS | Qty: 1000

## 2018-08-11 MED FILL — LORAZEPAM 2 MG/ML IJ SOLN: 2 mg/mL | INTRAMUSCULAR | Qty: 1

## 2018-08-11 MED FILL — ONDANSETRON (PF) 4 MG/2 ML INJECTION: 4 mg/2 mL | INTRAMUSCULAR | Qty: 4

## 2018-08-11 NOTE — ED Notes (Signed)
Several attempts made to obtain VS.  Pt very restless and takes BP cuff and Pulse ox off when nurse leaves room.  Stayed with pt but pt would not stay still to get reading

## 2018-08-11 NOTE — ED Notes (Signed)
Two attempts at obtaining oral temp and axillary temp with no success

## 2018-08-11 NOTE — ED Notes (Signed)
Alerted by ED secretary that patient needed his nurse patient found wobbling around room asking for a bath. Explained to patient I do not have the facilities for patient to take a bath at this time. Patient stated he needed to urinate at this time provided the urinal and patient did not void. Patient placed back in the bed at this time and in a position of comfort.

## 2018-08-11 NOTE — ED Notes (Signed)
Pt clearly agitated and frustrated with nausea and vomiting.  Very exaggerated and dramatic.  Appears to be dry heaving, no vomitus.  Dr Zanin notified.  Received verbal order for zofran and NS bolus

## 2018-08-11 NOTE — ED Provider Notes (Signed)
EMERGENCY DEPARTMENT HISTORY AND PHYSICAL EXAM      Date: 08/11/2018  Patient Name: Carl Howell  Patient Age and Sex: 42 y.o. male    History of Presenting Illness     Chief Complaint   Patient presents with   ??? Seizure     reports he woke up this morning and had a seizure. has a hx of brain cancer. hx of epilepsy   ??? Vomiting     "he is vomiting bile and phleghm" unable to keep down ice chips       History Provided By: Patient    HPI: Carl Howell, is a 42 y.o. 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  He also had a brief brief breakthrough seizure this morning.  He endorses compliance with his medications and states that he was in his usual state of health until this morning when the symptoms began.  He denies any abdominal pain, diarrhea or constipation.  No respiratory symptoms, including no shortness of breath or chest pain.  He took double of his Keppra dose this morning after having a breakthrough seizure.  Several hours later, the vomiting and headache began and this is when he decided to come in as he has not been able to keep down anything since.    Pt denies any other alleviating or exacerbating factors. There are no other complaints, changes or physical findings at this time.     Past Medical History:   Diagnosis Date   ??? Cancer of brain ([place])    ??? Epilepsy ([place]) unknown     Past Surgical History:   Procedure Laterality Date   ??? BRAIN AVM SURG,DURAL,COMPLX      brain surgery - unknown had brain tumor   ??? KIDNEY DIALYSATE DELIVRY SYS      kidney surgery - unknown   ??? NEUROLOGICAL PROCEDURE UNLISTED      craniotomy       PCP: Louie Boston, MD    Past History   Past Medical History:  Past Medical History:   Diagnosis Date   ??? Cancer of brain ([place])    ??? Epilepsy ([place]) unknown       Past Surgical History:  Past Surgical History:    Procedure Laterality Date   ??? BRAIN AVM SURG,DURAL,COMPLX      brain surgery - unknown had brain tumor   ??? KIDNEY DIALYSATE DELIVRY SYS      kidney surgery - unknown   ??? NEUROLOGICAL PROCEDURE UNLISTED      craniotomy       Family History:  No family history on file.    Social History:  Social History     Tobacco Use   ??? Smoking status: Former Smoker     Packs/day: 0.25     Types: Cigarettes   ??? Smokeless tobacco: Never Used   Substance Use Topics   ??? Alcohol use: No   ??? Drug use: Yes     Types: Marijuana       Allergies:  No Known Allergies    Current Medications:  No current facility-administered medications on file prior to encounter.      Current Outpatient Medications on File Prior to Encounter   Medication Sig Dispense Refill   ??? lacosamide (VIMPAT) 200 mg tab tablet Take 1 Tab by mouth every twelve (12) hours. Max Daily Amount:  400 mg. 30 Tab 0   ??? melatonin 3 mg tablet Take 1 Tab by mouth nightly as needed. 10 Tab 0   ??? oxyCODONE-acetaminophen (PERCOCET 10) 10-325 mg per tablet Take 1 Tab by mouth every four (4) hours as needed. Max Daily Amount: 6 Tabs. 10 Tab 0   ??? senna-docusate (PERICOLACE) 8.6-50 mg per tablet Take 1 Tab by mouth daily. 10 Tab 0   ??? diphenhydrAMINE (BENADRYL) 12.5 mg/5 mL elixir Take 5 mL by mouth every six (6) hours as needed. 50 mL 0   ??? cyclobenzaprine (FLEXERIL) 10 mg tablet Take 1 Tab by mouth three (3) times daily as needed for Muscle Spasm(s). 30 Tab 0   ??? venlafaxine-SR (EFFEXOR XR) 150 mg capsule Take 150 mg by mouth daily.     ??? ACETAMINOPHEN/DIPHENHYDRAMINE (TYLENOL PM PO) Take 2 Tabs by mouth nightly as needed for Other (Sleep).     ??? cholecalciferol (VITAMIN D3) 1,000 unit tablet Take 2,000 Units by mouth daily.     ??? levETIRAcetam (KEPPRA) 500 mg tablet Take 1,750 mg by mouth two (2) times a day.     ??? LORazepam (ATIVAN) 1 mg tablet Take 1 mg by mouth three (3) times daily as needed for Anxiety.     ??? DANDELION PO Take 1,040 mg by mouth daily. Takes 2 capsules (520 mg  each) daily     ??? ASHWAGANDHA ROOT EXTRACT,BULK, Take 500 mg by mouth daily. Takes 2 capsules (250 mg each) daily         Review of Systems   Review of Systems   Constitutional: Negative for appetite change, chills and fever.   Respiratory: Negative for cough, chest tightness and shortness of breath.    Cardiovascular: Negative for chest pain, palpitations and leg swelling.   Gastrointestinal: Positive for nausea and vomiting. Negative for abdominal distention, abdominal pain, blood in stool, constipation and diarrhea.   Genitourinary: Negative for decreased urine volume, difficulty urinating, dysuria, flank pain, frequency and hematuria.   Musculoskeletal: Negative for arthralgias, joint swelling, myalgias, neck pain and neck stiffness.   Neurological: Positive for seizures and headaches. Negative for dizziness, weakness, light-headedness and numbness.   Hematological: Negative for adenopathy.   All other systems reviewed and are negative.      Physical Exam   Physical Exam   Constitutional: He is oriented to person, place, and time. He appears well-developed and well-nourished. He appears distressed.   HENT:   Head: Atraumatic.   Mouth/Throat: Oropharynx is clear and moist.   Eyes: Pupils are equal, round, and reactive to light. Conjunctivae and EOM are normal. No scleral icterus.   Neck: Normal range of motion. Neck supple. No JVD present.   Cardiovascular: Normal rate, regular rhythm, normal heart sounds and intact distal pulses.   Pulmonary/Chest: Effort normal and breath sounds normal. He exhibits no tenderness.   Abdominal: Soft. Bowel sounds are normal. He exhibits no distension. There is no tenderness.   Musculoskeletal: Normal range of motion. He exhibits no edema.   Neurological: He is alert and oriented to person, place, and time. No cranial nerve deficit.   Skin: Skin is warm. He is diaphoretic.   Nursing note and vitals reviewed.      Diagnostic Study Results     Labs -   Recent Results (from the past 48 hour(s))   CBC WITH AUTOMATED DIFF    Collection Time: 08/11/18  4:56 PM   Result Value Ref Range    WBC 18.7 (H) 4.1 -  11.1 K/uL    RBC 4.89 4.10 - 5.70 M/uL    HGB 15.4 12.1 - 17.0 g/dL    HCT 44.8 36.6 - 50.3 %    MCV 91.6 80.0 - 99.0 FL    MCH 31.5 26.0 - 34.0 PG    MCHC 34.4 30.0 - 36.5 g/dL    RDW 11.9 11.5 - 14.5 %    PLATELET 416 (H) 150 - 400 K/uL    MPV 9.9 8.9 - 12.9 FL    NRBC 0.0 0 PER 100 WBC    ABSOLUTE NRBC 0.00 0.00 - 0.01 K/uL    NEUTROPHILS 86 (H) 32 - 75 %    LYMPHOCYTES 10 (L) 12 - 49 %    MONOCYTES 4 (L) 5 - 13 %    EOSINOPHILS 0 0 - 7 %    BASOPHILS 0 0 - 1 %    IMMATURE GRANULOCYTES 0 0.0 - 0.5 %    ABS. NEUTROPHILS 16.0 (H) 1.8 - 8.0 K/UL    ABS. LYMPHOCYTES 1.8 0.8 - 3.5 K/UL    ABS. MONOCYTES 0.7 0.0 - 1.0 K/UL    ABS. EOSINOPHILS 0.0 0.0 - 0.4 K/UL    ABS. BASOPHILS 0.1 0.0 - 0.1 K/UL    ABS. IMM. GRANS. 0.1 (H) 0.00 - 0.04 K/UL    DF AUTOMATED     METABOLIC PANEL, COMPREHENSIVE    Collection Time: 08/11/18  4:56 PM   Result Value Ref Range    Sodium 138 136 - 145 mmol/L    Potassium 4.2 3.5 - 5.1 mmol/L    Chloride 108 97 - 108 mmol/L    CO2 21 21 - 32 mmol/L    Anion gap 9 5 - 15 mmol/L    Glucose 131 (H) 65 - 100 mg/dL    BUN 7 6 - 20 MG/DL    Creatinine 1.38 (H) 0.70 - 1.30 MG/DL    BUN/Creatinine ratio 5 (L) 12 - 20      GFR est AA >60 >60 ml/min/1.22m    GFR est non-AA 57 (L) >60 ml/min/1.77m   Calcium 9.4 8.5 - 10.1 MG/DL    Bilirubin, total 0.9 0.2 - 1.0 MG/DL    ALT (SGPT) 34 12 - 78 U/L    AST (SGOT) 29 15 - 37 U/L    Alk. phosphatase 87 45 - 117 U/L    Protein, total 8.8 (H) 6.4 - 8.2 g/dL    Albumin 4.7 3.5 - 5.0 g/dL    Globulin 4.1 (H) 2.0 - 4.0 g/dL    A-G Ratio 1.1 1.1 - 2.2         Radiologic Studies -   CT HEAD WO CONT   Final Result   IMPRESSION: No acute findings. Chronic changes as above.                  Medical Decision Making   I am the first provider for this patient.     Records Reviewed: I reviewed our electronic medical record system for any past medical records that were available that may contribute to the patient's current condition, including their PMH, surgical history, social and family history. Reviewed the nursing notes and vital signs from today's visit.     Vital Signs-Reviewed the patient's vital signs.    Provider Notes (Medical Decision Making):   The patient is a [age]ear old male who has a history of epilepsy after undergoing several brain surgeries for brain cancer who presents today with a breakthrough  seizure and intractable nausea and vomiting.  All symptoms began today.   DDX: Viral gastritis, food poisoning, migraine, infection, ICH.  Plan: We will get basic labs and a CT head.  We will treat headache and emesis.  He has taken a loading dose of Keppra already and has not had any breakthrough seizures since this morning so we will hold off on loading him for now.    Reassessment: I reviewed the patient's labs.  He has an elevated white blood cell count which would be expected after having a seizure.  There is no evidence of any occult infection and his CT head is negative.  He has received IV fluids in the emergency department, antiemetics and a migraine cocktail.  He now states that he is feeling 100% better, is sitting up in bed and eating a sandwich.  He is asking to go home.  His mom is at the bedside and also confirms that her son looks like he is back to his baseline.  In a close relationship with her primary neurologist and will follow up early next week.  No other indication for any additional emergent work-up or treatment here in the ER.    ED Course:   Initial assessment performed. The patients presenting problems have been discussed, and they are in agreement with the care plan formulated and outlined with them.  I have encouraged them to ask questions as they arise throughout their visit.      Medications Administered During ED Course:  Medications    ondansetron (ZOFRAN) injection 4 mg (4 mg IntraVENous Given 08/11/18 1729)   sodium chloride 0.9 % bolus infusion 1,000 mL (0 mL IntraVENous IV Completed 08/11/18 1905)   ondansetron (ZOFRAN) injection 8 mg (8 mg IntraVENous Given 08/11/18 1822)   LORazepam (ATIVAN) injection 2 mg (2 mg IntraVENous Given 08/11/18 1822)     Progress note:  Patient has been reassessed and reports feeling considerably better, has normal vital signs and feels comfortable going home. I think this is reasonable as no findings today suggest a life-threatening condition.     DISPOSITION: DISCHARGE  The patient's results have been reviewed with patient and available family and/or caregiver. They verbally convey their understanding and agreement of the patient's signs, symptoms, diagnosis, treatment and prognosis and additionally agree to follow up as recommended in the discharge instructions or to return to the Emergency Department should the patient's condition change prior to their follow-up appointment.   The patient and available family and/or caregiver verbally agree with the care plan and all of their questions have been answered. The discharge instructions have also been provided to the them with educational information regarding the patient's diagnosis as well a list of reasons why the patient would want to return to the ER prior to their follow-up appointment should any concerns arise, the patient's condition change or symptoms worsen.    Charolotte Capuchin, MD, MSc      Diagnosis     Clinical Impression:   1. Vomiting, intractability of vomiting not specified, presence of nausea not specified, unspecified vomiting type    2. Seizure [facility])        Attestation:  I personally performed the services described in this documentation on this date 08/11/2018 for patient Carl Howell.  Charolotte Capuchin, MD    Please note that this dictation was completed with Dragon, the computer voice recognition software. Quite often unanticipated grammatical, syntax,  homophones, and other interpretive errors are inadvertently transcribed by the computer software. Please  disregard these errors.  Please excuse any errors that have escaped final proofreading.

## 2018-08-11 NOTE — ED Notes (Signed)
Pt given Ativan and has become less agitated and restless.  Family member turned down lights and went to cafeteria.  Pt resting quietly

## 2018-08-11 NOTE — ED Notes (Signed)
Bedside shift change report given to Chante, RN (oncoming nurse) by Suzanne, RN (offgoing nurse). Report included the following information SBAR, Kardex and Recent Results.

## 2018-08-11 NOTE — ED Notes (Signed)
Pt found on floor.  Reports he 'Wants to leave, I want to lay here on the floor"  Pt helped back to bed and appears to be resting with eyes closed.  Mother has returned to room and made aware of situation.  Pt reports he doesn't remember falling

## 2018-08-11 NOTE — ED Notes (Signed)
Discharge instructions given to patient by Dr. Zanin.  Verbalized understanding of instructions.  Patient discharged without difficulty.  Patient discharged in stable condition via W/C accompanied by mom.

## 2018-08-11 NOTE — ED Notes (Signed)
Pt pulled of remaining leads to monitor and BP cuff and pulse ox

## 2018-08-11 NOTE — ED Notes (Signed)
 Pt found on floor.  Reports he 'Wants to leave, I want to lay here on the floor  Pt helped back to bed and appears to be resting with eyes closed.  Mother has returned to room and made aware of situation.  Pt reports he doesn't remember falling

## 2018-08-11 NOTE — ED Notes (Signed)
Pt clearly agitated and frustrated with nausea and vomiting.  Very exaggerated and dramatic.  Appears to be dry heaving, no vomitus.  Dr Pamella Pert notified.  Received verbal order for zofran and NS bolus

## 2018-08-11 NOTE — ED Provider Notes (Signed)
ED Provider Notes by Raelyn Mora, MD at 08/11/18 2257                Author: Raelyn Mora, MD  Service: EMERGENCY  Author Type: Physician       Filed: 08/12/18 2225  Date of Service: 08/11/18 2257  Status: Signed          Editor: Raelyn Mora, MD (Physician)               EMERGENCY DEPARTMENT HISTORY AND PHYSICAL EXAM           Date: 08/11/2018   Patient Name: Carl Howell   Patient Age and Sex: 42 y.o.  male        History of Presenting Illness          Chief Complaint       Patient presents with        ?  Seizure             reports he woke up this morning and had a seizure. has a hx of brain cancer. hx of epilepsy        ?  Vomiting             "he is vomiting bile and phleghm" unable to keep down ice chips           History Provided By: Patient      HPI: Carl Howell, is a  42 y.o. 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  He also had a brief brief breakthrough seizure this morning.  He endorses compliance with his medications and states that he was in  his usual state of health until this morning when the symptoms began.  He denies any abdominal pain, diarrhea or constipation.  No respiratory symptoms, including no shortness of breath or chest pain.  He took double of his Keppra dose this morning after  having a breakthrough seizure.  Several hours later, the vomiting and headache began and this is when he decided to come in as he has not been able to keep down anything since.      Pt denies any other alleviating or exacerbating factors. There are no other complaints, changes or physical findings at this time.         Past Medical History:        Diagnosis  Date         ?  Cancer of brain [facility])           ?  Epilepsy ([place])  unknown          Past Surgical History:         Procedure  Laterality  Date          ?  BRAIN AVM SURG,DURAL,COMPLX               brain surgery - unknown had brain tumor          ?  KIDNEY DIALYSATE DELIVRY SYS              kidney surgery - unknown          ?  NEUROLOGICAL PROCEDURE UNLISTED              craniotomy           PCP: Louie Boston, MD  Past History     Past Medical History:     Past Medical History:        Diagnosis  Date         ?  Cancer of brain [facility])           ?  Epilepsy ([place])  unknown           Past Surgical History:     Past Surgical History:         Procedure  Laterality  Date          ?  BRAIN AVM SURG,DURAL,COMPLX              brain surgery - unknown had brain tumor          ?  KIDNEY DIALYSATE DELIVRY SYS              kidney surgery - unknown          ?  NEUROLOGICAL PROCEDURE UNLISTED              craniotomy           Family History:   No family history on file.      Social History:     Social History          Tobacco Use         ?  Smoking status:  Former Smoker              Packs/day:  0.25         Types:  Cigarettes         ?  Smokeless tobacco:  Never Used       Substance Use Topics         ?  Alcohol use:  No     ?  Drug use:  Yes              Types:  Marijuana           Allergies:   No Known Allergies      Current Medications:     No current facility-administered medications on file prior to encounter.           Current Outpatient Medications on File Prior to Encounter          Medication  Sig  Dispense  Refill           ?  lacosamide (VIMPAT) 200 mg tab tablet  Take 1 Tab by mouth every twelve (12) hours. Max Daily Amount: 400 mg.  30 Tab  0           ?  melatonin 3 mg tablet  Take 1 Tab by mouth nightly as needed.  10 Tab  0           ?  oxyCODONE-acetaminophen (PERCOCET 10) 10-325 mg per tablet  Take 1 Tab by mouth every four (4) hours as needed. Max Daily Amount: 6 Tabs.  10 Tab  0     ?  senna-docusate (PERICOLACE) 8.6-50 mg per tablet  Take 1 Tab by mouth daily.  10 Tab  0     ?  diphenhydrAMINE (BENADRYL) 12.5 mg/5 mL elixir  Take 5 mL by mouth every six (6) hours as needed.  50 mL  0      ?  cyclobenzaprine (FLEXERIL) 10 mg tablet  Take 1 Tab by mouth three (3) times daily as needed for Muscle Spasm(s).  30 Tab  0     ?  venlafaxine-SR (EFFEXOR XR) 150 mg capsule  Take 150 mg by mouth daily.         ?  ACETAMINOPHEN/DIPHENHYDRAMINE (TYLENOL PM PO)  Take 2 Tabs by mouth nightly as needed for Other (Sleep).         ?  cholecalciferol (VITAMIN D3) 1,000 unit tablet  Take 2,000 Units by mouth daily.         ?  levETIRAcetam (KEPPRA) 500 mg tablet  Take 1,750 mg by mouth two (2) times a day.         ?  LORazepam (ATIVAN) 1 mg tablet  Take 1 mg by mouth three (3) times daily as needed for Anxiety.         ?  DANDELION PO  Take 1,040 mg by mouth daily. Takes 2 capsules (520 mg each) daily               ?  ASHWAGANDHA ROOT EXTRACT,BULK,  Take 500 mg by mouth daily. Takes 2 capsules (250 mg each) daily                 Review of Systems     Review of Systems    Constitutional: Negative for appetite change, chills and fever.    Respiratory: Negative for cough, chest tightness and shortness of breath.     Cardiovascular: Negative for chest pain, palpitations and leg swelling.    Gastrointestinal: Positive for nausea and vomiting . Negative for abdominal distention, abdominal pain, blood in stool, constipation and diarrhea.    Genitourinary: Negative for decreased urine volume, difficulty urinating, dysuria, flank pain, frequency and hematuria.    Musculoskeletal: Negative for arthralgias, joint swelling, myalgias, neck pain and neck stiffness.    Neurological: Positive for seizures and headaches . Negative for dizziness, weakness, light-headedness and numbness.    Hematological: Negative for adenopathy.    All other systems reviewed and are negative.           Physical Exam     Physical Exam    Constitutional: He is oriented to person, place, and time. He appears well-developed and well-nourished. He appears distressed.    HENT:    Head: Atraumatic.    Mouth/Throat: Oropharynx is clear and moist.    Eyes:  Pupils are equal, round, and reactive to light. Conjunctivae and EOM are normal. No scleral icterus.    Neck: Normal range of motion. Neck supple. No JVD present.    Cardiovascular: Normal rate, regular rhythm, normal heart sounds and intact distal pulses.    Pulmonary/Chest: Effort normal and breath sounds normal. He exhibits no tenderness.    Abdominal: Soft. Bowel sounds are normal. He exhibits no distension. There is no tenderness.   Musculoskeletal: Normal range of motion. He exhibits no edema.    Neurological: He is alert and oriented to person, place, and time. No cranial nerve deficit.    Skin: Skin is warm. He is diaphoretic.    Nursing note and vitals reviewed.           Diagnostic Study Results        Labs -     Recent Results (from the past 48 hour(s))     CBC WITH AUTOMATED DIFF          Collection Time: 08/11/18  4:56 PM         Result  Value  Ref Range            WBC  18.7 (H)  4.1 - 11.1 K/uL  RBC  4.89  4.10 - 5.70 M/uL       HGB  15.4  12.1 - 17.0 g/dL       HCT  44.8  36.6 - 50.3 %       MCV  91.6  80.0 - 99.0 FL       MCH  31.5  26.0 - 34.0 PG       MCHC  34.4  30.0 - 36.5 g/dL       RDW  11.9  11.5 - 14.5 %       PLATELET  416 (H)  150 - 400 K/uL       MPV  9.9  8.9 - 12.9 FL       NRBC  0.0  0 PER 100 WBC       ABSOLUTE NRBC  0.00  0.00 - 0.01 K/uL       NEUTROPHILS  86 (H)  32 - 75 %       LYMPHOCYTES  10 (L)  12 - 49 %       MONOCYTES  4 (L)  5 - 13 %       EOSINOPHILS  0  0 - 7 %       BASOPHILS  0  0 - 1 %       IMMATURE GRANULOCYTES  0  0.0 - 0.5 %       ABS. NEUTROPHILS  16.0 (H)  1.8 - 8.0 K/UL       ABS. LYMPHOCYTES  1.8  0.8 - 3.5 K/UL       ABS. MONOCYTES  0.7  0.0 - 1.0 K/UL       ABS. EOSINOPHILS  0.0  0.0 - 0.4 K/UL       ABS. BASOPHILS  0.1  0.0 - 0.1 K/UL       ABS. IMM. GRANS.  0.1 (H)  0.00 - 0.04 K/UL       DF  AUTOMATED          METABOLIC PANEL, COMPREHENSIVE          Collection Time: 08/11/18  4:56 PM         Result  Value  Ref Range            Sodium  138  136 - 145  mmol/L       Potassium  4.2  3.5 - 5.1 mmol/L       Chloride  108  97 - 108 mmol/L       CO2  21  21 - 32 mmol/L       Anion gap  9  5 - 15 mmol/L       Glucose  131 (H)  65 - 100 mg/dL       BUN  7  6 - 20 MG/DL       Creatinine  1.38 (H)  0.70 - 1.30 MG/DL       BUN/Creatinine ratio  5 (L)  12 - 20         GFR est AA  >60  >60 ml/min/1.61m       GFR est non-AA  57 (L)  >60 ml/min/1.78m      Calcium  9.4  8.5 - 10.1 MG/DL       Bilirubin, total  0.9  0.2 - 1.0 MG/DL       ALT (SGPT)  34  12 - 78 U/L       AST (SGOT)  29  15 - 37 U/L       Alk. phosphatase  87  45 - 117 U/L       Protein, total  8.8 (H)  6.4 - 8.2 g/dL       Albumin  4.7  3.5 - 5.0 g/dL       Globulin  4.1 (H)  2.0 - 4.0 g/dL            A-G Ratio  1.1  1.1 - 2.2             Radiologic Studies -      CT HEAD WO CONT       Final Result     IMPRESSION: No acute findings. Chronic changes as above.                                 Medical Decision Making     I am the first provider for this patient.      Records Reviewed: I reviewed our electronic medical record system for any past medical records that were available that may contribute to the  patient's current condition, including their PMH, surgical history, social and family history. Reviewed the nursing notes and vital signs from today's visit.       Vital Signs-Reviewed the patient's vital signs.      Provider Notes (Medical Decision Making):    The patient is a 42 year old male who has a history of epilepsy after undergoing several brain surgeries for brain cancer who presents today with a breakthrough seizure and intractable nausea and vomiting.   All symptoms began today.    DDX: Viral gastritis, food poisoning, migraine, infection, ICH.   Plan: We will get basic labs and a CT head.  We will treat headache and emesis.  He has taken a loading dose of Keppra already and has not had any breakthrough seizures since this morning so we will hold off on loading him for now.      Reassessment: I  reviewed the patient's labs.  He has an elevated white blood cell count which would be expected after having a seizure.  There is no evidence of any occult infection and his CT head is negative.   He has received IV fluids in the emergency department, antiemetics and a migraine cocktail.  He now states that he is feeling 100% better, is sitting up in bed and eating a sandwich.  He is asking to go home.   His mom is at the bedside and also confirms that her son looks like he is back to his baseline.   In a close relationship with her primary neurologist and will follow up early next week.  No other indication for any additional emergent work-up or treatment here in the ER.      ED Course:    Initial assessment performed. The patients presenting problems have been discussed, and they are in agreement with the care plan formulated and outlined with them.  I have encouraged them to ask questions as they arise throughout their visit.         Medications Administered During ED Course:     Medications       ondansetron (ZOFRAN) injection 4 mg (4 mg IntraVENous Given 08/11/18 1729)     sodium chloride 0.9 % bolus infusion 1,000 mL (0 mL IntraVENous IV Completed 08/11/18 1905)     ondansetron (ZOFRAN) injection 8 mg (  8 mg IntraVENous Given 08/11/18 1822)       LORazepam (ATIVAN) injection 2 mg (2 mg IntraVENous Given 08/11/18 1822)        Progress note:   Patient has been reassessed and reports feeling considerably better, has normal vital signs and feels comfortable going home. I think this is reasonable as no findings today suggest a life-threatening  condition.       DISPOSITION: DISCHARGE   The patient's results have been reviewed with patient and available family and/or caregiver. They verbally convey their understanding and agreement of the patient's signs, symptoms,  diagnosis, treatment and prognosis and additionally agree to follow up as recommended in the discharge instructions or to return to the Emergency Department  should the patient's condition change prior to their follow-up appointment.    The patient and available family and/or caregiver verbally agree with the care plan and all of their questions have been answered. The discharge instructions have also been provided to the them with educational information regarding the patient's diagnosis  as well a list of reasons why the patient would want to return to the ER prior to their follow-up appointment should any concerns arise, the patient's condition change or symptoms worsen.      Charolotte Capuchin, MD, MSc           Diagnosis        Clinical Impression:       1.  Vomiting, intractability of vomiting not specified, presence of nausea not specified, unspecified vomiting type         2.  Seizure [facility])            Attestation:   I personally performed the services described in this documentation on this date 08/11/2018 for patient Noa Pietila.  Charolotte Capuchin, MD      Please note that this dictation was completed with Dragon, the computer voice recognition software. Quite often unanticipated grammatical, syntax, homophones, and other interpretive errors are  inadvertently transcribed by the computer software. Please disregard these errors.  Please excuse any errors that have escaped final proofreading.

## 2018-08-11 NOTE — ED Notes (Signed)
Several attempts made to obtain VS.  Pt very restless and takes BP cuff and Pulse ox off when nurse leaves room.  Stayed with pt but pt would not stay still to get reading

## 2018-08-11 NOTE — ED Notes (Signed)
Two attempts at obtaining oral temp and axillary temp with no success

## 2018-08-11 NOTE — ED Notes (Signed)
Pt pulled of remaining leads to monitor and BP cuff and pulse ox

## 2018-08-11 NOTE — ED Notes (Signed)
Discharge instructions given to patient by Dr. Pamella Pert.  Verbalized understanding of instructions.  Patient discharged without difficulty.  Patient discharged in stable condition via W/C accompanied by mom.

## 2018-08-11 NOTE — ED Notes (Signed)
Pt given Ativan and has become less agitated and restless.  Family member turned down lights and went to cafeteria.  Pt resting quietly

## 2018-08-11 NOTE — ED Notes (Signed)
Alerted by ED secretary that patient needed his nurse patient found wobbling around room asking for a bath. Explained to patient I do not have the facilities for patient to take a bath at this time. Patient stated he needed to urinate at this time provided the urinal and patient did not void. Patient placed back in the bed at this time and in a position of comfort.

## 2018-08-12 MED ORDER — ONDANSETRON 4 MG TAB, RAPID DISSOLVE
4 mg | ORAL_TABLET | Freq: Three times a day (TID) | ORAL | 0 refills | Status: AC | PRN
Start: 2018-08-12 — End: 2018-08-15

## 2019-03-16 ENCOUNTER — Inpatient Hospital Stay
Admit: 2019-03-16 | Discharge: 2019-03-17 | Disposition: A | Discharge status: Home / Self Care | Payer: MEDICARE | Attending: Emergency Medicine

## 2019-03-16 DIAGNOSIS — R112 Nausea with vomiting, unspecified: Secondary | ICD-10-CM

## 2019-03-17 LAB — METABOLIC PANEL, COMPREHENSIVE
A-G Ratio: 1 — ABNORMAL LOW (ref 1.1–2.2)
ALT (SGPT): 47 U/L (ref 12–78)
AST (SGOT): 21 U/L (ref 15–37)
Albumin: 4.2 g/dL (ref 3.5–5.0)
Alk. phosphatase: 64 U/L (ref 45–117)
Anion gap: 12 mmol/L (ref 5–15)
BUN/Creatinine ratio: 8 — ABNORMAL LOW (ref 12–20)
BUN: 9 MG/DL (ref 6–20)
Bilirubin, total: 0.8 MG/DL (ref 0.2–1.0)
CO2: 22 mmol/L (ref 21–32)
Calcium: 9.8 MG/DL (ref 8.5–10.1)
Chloride: 98 mmol/L (ref 97–108)
Creatinine: 1.09 MG/DL (ref 0.70–1.30)
GFR est AA: >60 mL/min/{1.73_m2} (ref >60)
GFR est non-AA: >60 mL/min/{1.73_m2} (ref >60)
Globulin: 4.3 g/dL — ABNORMAL HIGH (ref 2.0–4.0)
Glucose: 144 mg/dL — ABNORMAL HIGH (ref 65–100)
Potassium: 4.2 mmol/L (ref 3.5–5.1)
Protein, total: 8.5 g/dL — ABNORMAL HIGH (ref 6.4–8.2)
Sodium: 132 mmol/L — ABNORMAL LOW (ref 136–145)

## 2019-03-17 LAB — CBC WITH AUTOMATED DIFF
ABS. BASOPHILS: 0 10*3/uL (ref 0.0–0.1)
ABS. EOSINOPHILS: 0 10*3/uL (ref 0.0–0.4)
ABS. IMM. GRANS.: 0.1 10*3/uL — ABNORMAL HIGH (ref 0.00–0.04)
ABS. LYMPHOCYTES: 1 10*3/uL (ref 0.8–3.5)
ABS. MONOCYTES: 0.5 10*3/uL (ref 0.0–1.0)
ABS. NEUTROPHILS: 8.4 10*3/uL — ABNORMAL HIGH (ref 1.8–8.0)
ABSOLUTE NRBC: 0 10*3/uL (ref 0.00–0.01)
BASOPHILS: 0 % (ref 0–1)
EOSINOPHILS: 0 % (ref 0–7)
HCT: 41.8 % (ref 36.6–50.3)
HGB: 14.9 g/dL (ref 12.1–17.0)
IMMATURE GRANULOCYTES: 1 % — ABNORMAL HIGH (ref 0.0–0.5)
LYMPHOCYTES: 10 % — ABNORMAL LOW (ref 12–49)
MCH: 32.8 PG (ref 26.0–34.0)
MCHC: 35.6 g/dL (ref 30.0–36.5)
MCV: 92.1 FL (ref 80.0–99.0)
MONOCYTES: 5 % (ref 5–13)
MPV: 11.4 FL (ref 8.9–12.9)
NEUTROPHILS: 84 % — ABNORMAL HIGH (ref 32–75)
NRBC: 0 PER 100 WBC
PLATELET: 280 10*3/uL (ref 150–400)
RBC: 4.54 M/uL (ref 4.10–5.70)
RDW: 12.5 % (ref 11.5–14.5)
WBC: 10 10*3/uL (ref 4.1–11.1)

## 2019-03-17 LAB — COMPREHENSIVE METABOLIC PANEL
ALT: 47 U/L (ref 12–78)
AST: 21 U/L (ref 15–37)
Albumin/Globulin Ratio: 1 — ABNORMAL LOW (ref 1.1–2.2)
Albumin: 4.2 g/dL (ref 3.5–5.0)
Alkaline Phosphatase: 64 U/L (ref 45–117)
Anion Gap: 12 mmol/L (ref 5–15)
BUN: 9 MG/DL (ref 6–20)
Bun/Cre Ratio: 8 — ABNORMAL LOW (ref 12–20)
CO2: 22 mmol/L (ref 21–32)
Calcium: 9.8 MG/DL (ref 8.5–10.1)
Chloride: 98 mmol/L (ref 97–108)
Creatinine: 1.09 MG/DL (ref 0.70–1.30)
EGFR IF NonAfrican American: >60 mL/min/{1.73_m2} (ref >60)
GFR African American: >60 mL/min/{1.73_m2} (ref >60)
Globulin: 4.3 g/dL — ABNORMAL HIGH (ref 2.0–4.0)
Glucose: 144 mg/dL — ABNORMAL HIGH (ref 65–100)
Potassium: 4.2 mmol/L (ref 3.5–5.1)
Sodium: 132 mmol/L — ABNORMAL LOW (ref 136–145)
Total Bilirubin: 0.8 MG/DL (ref 0.2–1.0)
Total Protein: 8.5 g/dL — ABNORMAL HIGH (ref 6.4–8.2)

## 2019-03-17 LAB — CBC WITH AUTO DIFFERENTIAL
Basophils %: 0 % (ref 0–1)
Basophils Absolute: 0 10*3/uL (ref 0.0–0.1)
Eosinophils %: 0 % (ref 0–7)
Eosinophils Absolute: 0 10*3/uL (ref 0.0–0.4)
Granulocyte Absolute Count: 0.1 10*3/uL — ABNORMAL HIGH (ref 0.00–0.04)
Hematocrit: 41.8 % (ref 36.6–50.3)
Hemoglobin: 14.9 g/dL (ref 12.1–17.0)
Immature Granulocytes: 1 % — ABNORMAL HIGH (ref 0.0–0.5)
Lymphocytes %: 10 % — ABNORMAL LOW (ref 12–49)
Lymphocytes Absolute: 1 10*3/uL (ref 0.8–3.5)
MCH: 32.8 PG (ref 26.0–34.0)
MCHC: 35.6 g/dL (ref 30.0–36.5)
MCV: 92.1 FL (ref 80.0–99.0)
MPV: 11.4 FL (ref 8.9–12.9)
Monocytes %: 5 % (ref 5–13)
Monocytes Absolute: 0.5 10*3/uL (ref 0.0–1.0)
NRBC Absolute: 0 10*3/uL (ref 0.00–0.01)
Neutrophils %: 84 % — ABNORMAL HIGH (ref 32–75)
Neutrophils Absolute: 8.4 10*3/uL — ABNORMAL HIGH (ref 1.8–8.0)
Nucleated RBCs: 0 PER 100 WBC
Platelets: 280 10*3/uL (ref 150–400)
RBC: 4.54 M/uL (ref 4.10–5.70)
RDW: 12.5 % (ref 11.5–14.5)
WBC: 10 10*3/uL (ref 4.1–11.1)

## 2019-03-17 MED ORDER — SODIUM CHLORIDE 0.9 % INJECTION
5 mg/mL | Freq: Once | INTRAMUSCULAR | Status: AC
Start: 2019-03-17 — End: 2019-03-16
  Administered 2019-03-17: 04:00:00 via INTRAVENOUS

## 2019-03-17 MED ORDER — OXYCODONE-ACETAMINOPHEN 5 MG-325 MG TAB
5-325 mg | ORAL | Status: AC
Start: 2019-03-17 — End: 2019-03-17
  Administered 2019-03-17: 04:00:00 via ORAL

## 2019-03-17 MED ORDER — LEVETIRACETAM IN SOD CHLORIDE (ISO-OSM) 1,000 MG/100 ML IV INFUSION
1000 mg/100 mL | INTRAVENOUS | Status: AC
Start: 2019-03-17 — End: 2019-03-17
  Administered 2019-03-17: 04:00:00 via INTRAVENOUS

## 2019-03-17 MED ORDER — DIPHENHYDRAMINE HCL 50 MG/ML IJ SOLN
50 mg/mL | INTRAMUSCULAR | Status: AC
Start: 2019-03-17 — End: 2019-03-16
  Administered 2019-03-17: 04:00:00 via INTRAVENOUS

## 2019-03-17 MED ORDER — ONDANSETRON (PF) 4 MG/2 ML INJECTION
4 mg/2 mL | INTRAMUSCULAR | Status: AC
Start: 2019-03-17 — End: 2019-03-16
  Administered 2019-03-17: 04:00:00 via INTRAVENOUS

## 2019-03-17 MED ORDER — SODIUM CHLORIDE 0.9 % IV
25 mg/mL | Freq: Once | INTRAVENOUS | Status: DC
Start: 2019-03-17 — End: 2019-03-16

## 2019-03-17 MED ORDER — SODIUM CHLORIDE 0.9% BOLUS IV
0.9 % | Freq: Once | INTRAVENOUS | Status: AC
Start: 2019-03-17 — End: 2019-03-17
  Administered 2019-03-17: 05:00:00 via INTRAVENOUS

## 2019-03-17 MED FILL — SODIUM CHLORIDE 0.9 % IV: INTRAVENOUS | Qty: 1000

## 2019-03-17 MED FILL — OXYCODONE-ACETAMINOPHEN 5 MG-325 MG TAB: 5-325 mg | ORAL | Qty: 2

## 2019-03-17 MED FILL — DIPHENHYDRAMINE HCL 50 MG/ML IJ SOLN: 50 mg/mL | INTRAMUSCULAR | Qty: 1

## 2019-03-17 MED FILL — ONDANSETRON (PF) 4 MG/2 ML INJECTION: 4 mg/2 mL | INTRAMUSCULAR | Qty: 4

## 2019-03-17 MED FILL — PROCHLORPERAZINE EDISYLATE 5 MG/ML INJECTION: 5 mg/mL | INTRAMUSCULAR | Qty: 2

## 2019-03-17 MED FILL — LEVETIRACETAM IN SOD CHLORIDE (ISO-OSM) 1,000 MG/100 ML IV INFUSION: 1000 mg/100 mL | INTRAVENOUS | Qty: 100

## 2019-03-17 NOTE — ED Provider Notes (Signed)
EMERGENCY DEPARTMENT HISTORY AND PHYSICAL EXAM      Date: 03/16/2019  Patient Name: Carl Howell  Patient Age and Sex: 43 y.o. male    History of Presenting Illness     Chief Complaint   Patient presents with   ??? Vomiting       History Provided By: Patient    HPI: Carl Howell, is a 43 y.o. male whose medical history is noted below presents to the ED with intractable nausea and vomiting. He is usually cared for at [facility] due to history of brain cancer. Last MRI was less than a week ago. The intractable n/v is a recurrent issue for him and he has to come to the ER for treatment if unable to tolerate PO or symptoms at home. He does not have any antiemetics left at home currently.  No new neuro complaints. States heh as had progressive left-sided deficits due to cancer, but these are old.  No fevers or chills.   Has seizures as well, on meds and reports compliance but unsure how much keppra he kept down today due to vomiting.     No diarrhea ,fevers or chills. No severe HA. No sick contacts.    Pt denies any other alleviating or exacerbating factors. There are no other complaints, changes or physical findings at this time.     Past Medical History:   Diagnosis Date   ??? Cancer of brain ([place])    ??? Epilepsy ([place]) unknown     Past Surgical History:   Procedure Laterality Date   ??? BRAIN AVM SURG,DURAL,COMPLX      brain surgery - unknown had brain tumor   ??? KIDNEY DIALYSATE DELIVRY SYS      kidney surgery - unknown   ??? NEUROLOGICAL PROCEDURE UNLISTED      craniotomy       PCP: Louie Boston, MD    Past History   Past Medical History:  Past Medical History:   Diagnosis Date   ??? Cancer of brain ([place])    ??? Epilepsy ([place]) unknown       Past Surgical History:  Past Surgical History:   Procedure Laterality Date   ??? BRAIN AVM SURG,DURAL,COMPLX      brain surgery - unknown had brain tumor   ??? KIDNEY DIALYSATE DELIVRY SYS      kidney surgery - unknown   ??? NEUROLOGICAL PROCEDURE UNLISTED      craniotomy       Family History:   No family history on file.    Social History:  Social History     Tobacco Use   ??? Smoking status: Former Smoker     Packs/day: 0.25     Types: Cigarettes   ??? Smokeless tobacco: Never Used   Substance Use Topics   ??? Alcohol use: No   ??? Drug use: Yes     Types: Marijuana       Allergies:  No Known Allergies    Current Medications:  No current facility-administered medications on file prior to encounter.      Current Outpatient Medications on File Prior to Encounter   Medication Sig Dispense Refill   ??? lacosamide (VIMPAT) 200 mg tab tablet Take 1 Tab by mouth every twelve (12) hours. Max Daily Amount: 400 mg. 30 Tab 0   ??? melatonin 3 mg tablet Take 1 Tab by mouth nightly as needed. 10 Tab 0   ??? oxyCODONE-acetaminophen (PERCOCET 10) 10-325 mg per tablet Take 1 Tab by mouth every  four (4) hours as needed. Max Daily Amount: 6 Tabs. 10 Tab 0   ??? senna-docusate (PERICOLACE) 8.6-50 mg per tablet Take 1 Tab by mouth daily. 10 Tab 0   ??? diphenhydrAMINE (BENADRYL) 12.5 mg/5 mL elixir Take 5 mL by mouth every six (6) hours as needed. 50 mL 0   ??? cyclobenzaprine (FLEXERIL) 10 mg tablet Take 1 Tab by mouth three (3) times daily as needed for Muscle Spasm(s). 30 Tab 0   ??? venlafaxine-SR (EFFEXOR XR) 150 mg capsule Take 150 mg by mouth daily.     ??? ACETAMINOPHEN/DIPHENHYDRAMINE (TYLENOL PM PO) Take 2 Tabs by mouth nightly as needed for Other (Sleep).     ??? cholecalciferol (VITAMIN D3) 1,000 unit tablet Take 2,000 Units by mouth daily.     ??? levETIRAcetam (KEPPRA) 500 mg tablet Take 1,750 mg by mouth two (2) times a day.     ??? LORazepam (ATIVAN) 1 mg tablet Take 1 mg by mouth three (3) times daily as needed for Anxiety.     ??? DANDELION PO Take 1,040 mg by mouth daily. Takes 2 capsules (520 mg each) daily     ??? ASHWAGANDHA ROOT EXTRACT,BULK, Take 500 mg by mouth daily. Takes 2 capsules (250 mg each) daily         Review of Systems     Review of Systems   Constitutional: Positive for fatigue. Negative for appetite change, chills  and fever.   Respiratory: Negative for cough, chest tightness and shortness of breath.    Cardiovascular: Negative for chest pain, palpitations and leg swelling.   Gastrointestinal: Positive for nausea and vomiting. Negative for abdominal distention, abdominal pain, blood in stool, constipation and diarrhea.   Genitourinary: Negative for decreased urine volume, difficulty urinating, dysuria, flank pain, frequency and hematuria.   Musculoskeletal: Negative for arthralgias, joint swelling, myalgias, neck pain and neck stiffness.   Neurological: Positive for weakness. Negative for dizziness, seizures, syncope, facial asymmetry, speech difficulty, light-headedness, numbness and headaches.   Hematological: Negative for adenopathy.   All other systems reviewed and are negative.      Physical Exam     Physical Exam  Vitals signs and nursing note reviewed.   Constitutional:       Appearance: He is not toxic-appearing.   HENT:      Mouth/Throat:      Mouth: Mucous membranes are moist.   Eyes:      General: No scleral icterus.     Extraocular Movements: Extraocular movements intact.      Conjunctiva/sclera: Conjunctivae normal.      Pupils: Pupils are equal, round, and reactive to light.   Neck:      Musculoskeletal: Normal range of motion and neck supple.      Vascular: No JVD.   Cardiovascular:      Rate and Rhythm: Normal rate and regular rhythm.      Pulses: Normal pulses.      Heart sounds: Normal heart sounds.   Pulmonary:      Effort: Pulmonary effort is normal.      Breath sounds: Normal breath sounds.   Chest:      Chest wall: No tenderness.   Abdominal:      General: Bowel sounds are normal. There is no distension.      Palpations: Abdomen is soft.      Tenderness: There is no abdominal tenderness.   Musculoskeletal: Normal range of motion.         General: No swelling or  tenderness.      Right lower leg: No edema.      Left lower leg: No edema.   Skin:     General: Skin is warm and dry.       Capillary Refill: Capillary refill takes less than 2 seconds.      Findings: No erythema or rash.   Neurological:      General: No focal deficit present.      Mental Status: Mental status is at baseline.      Cranial Nerves: Cranial nerves are intact.      Motor: Motor function is intact.   Psychiatric:         Mood and Affect: Mood normal.         Behavior: Behavior normal.         Diagnostic Study Results     Labs - I have personally reviewed and interpreted all laboratory results. Charolotte Capuchin, MD, MSc  Recent Results (from the past 24 hour(s))   CBC WITH AUTOMATED DIFF    Collection Time: 03/16/19  9:57 PM   Result Value Ref Range    WBC 10.0 4.1 - 11.1 K/uL    RBC 4.54 4.10 - 5.70 M/uL    HGB 14.9 12.1 - 17.0 g/dL    HCT 41.8 36.6 - 50.3 %    MCV 92.1 80.0 - 99.0 FL    MCH 32.8 26.0 - 34.0 PG    MCHC 35.6 30.0 - 36.5 g/dL    RDW 12.5 11.5 - 14.5 %    PLATELET 280 150 - 400 K/uL    MPV 11.4 8.9 - 12.9 FL    NRBC 0.0 0 PER 100 WBC    ABSOLUTE NRBC 0.00 0.00 - 0.01 K/uL    NEUTROPHILS 84 (H) 32 - 75 %    LYMPHOCYTES 10 (L) 12 - 49 %    MONOCYTES 5 5 - 13 %    EOSINOPHILS 0 0 - 7 %    BASOPHILS 0 0 - 1 %    IMMATURE GRANULOCYTES 1 (H) 0.0 - 0.5 %    ABS. NEUTROPHILS 8.4 (H) 1.8 - 8.0 K/UL    ABS. LYMPHOCYTES 1.0 0.8 - 3.5 K/UL    ABS. MONOCYTES 0.5 0.0 - 1.0 K/UL    ABS. EOSINOPHILS 0.0 0.0 - 0.4 K/UL    ABS. BASOPHILS 0.0 0.0 - 0.1 K/UL    ABS. IMM. GRANS. 0.1 (H) 0.00 - 0.04 K/UL    DF AUTOMATED     METABOLIC PANEL, COMPREHENSIVE    Collection Time: 03/16/19  9:57 PM   Result Value Ref Range    Sodium 132 (L) 136 - 145 mmol/L    Potassium 4.2 3.5 - 5.1 mmol/L    Chloride 98 97 - 108 mmol/L    CO2 22 21 - 32 mmol/L    Anion gap 12 5 - 15 mmol/L    Glucose 144 (H) 65 - 100 mg/dL    BUN 9 6 - 20 MG/DL    Creatinine 1.09 0.70 - 1.30 MG/DL    BUN/Creatinine ratio 8 (L) 12 - 20      GFR est AA >60 >60 ml/min/1.33m    GFR est non-AA >60 >60 ml/min/1.717m   Calcium 9.8 8.5 - 10.1 MG/DL     Bilirubin, total 0.8 0.2 - 1.0 MG/DL    ALT (SGPT) 47 12 - 78 U/L    AST (SGOT) 21 15 - 37 U/L    Alk. phosphatase 64 45 -  117 U/L    Protein, total 8.5 (H) 6.4 - 8.2 g/dL    Albumin 4.2 3.5 - 5.0 g/dL    Globulin 4.3 (H) 2.0 - 4.0 g/dL    A-G Ratio 1.0 (L) 1.1 - 2.2         Radiologic Studies - I have personally reviewed and interpreted all imaging studies and agree with radiology interpretation and report. - Charolotte Capuchin, MD, MSc  No orders to display         Medical Decision Making   I am the first provider for this patient.    Records Reviewed: I reviewed our electronic medical record system for any past medical records that were available that may contribute to the patient's current condition, including their PMH, surgical history, social and family history. Reviewed the nursing notes and vital signs from today's visit.   Nursing notes will be reviewed as they become available in realtime while the pt has been in the ED.  Charolotte Capuchin, MD Msc    Vital Signs-Reviewed the patient's vital signs.  Patient Vitals for the past 24 hrs:   Temp Pulse Resp BP SpO2   03/16/19 2008 97.6 ??F (36.4 ??C) 83 18 (!) 149/105 100 %     Provider Notes (Medical Decision Making):   Patient with known brain ca presents with intractable n/v. Given that this is a recurrent issue for him, I have a low suspicion for other etiologies, such as gastroenteritis, ileus, sbo or other acute illness.  VSS. Completely benign abdominal exam.  Since he recently had an Mri, I do not feel compelled to order another imaging study just yet. Patient is nrurolgically at his baseline. Will treat symptoms and reassess. Get basic electrolytes to make sure he is not severely dehydrated and that his k is normal.     If he feels better after treatment, ok to go home.     Reassessment: patient asking to go home. All sympotms resolved  ED Course:   Initial assessment performed. The patients presenting problems have been  discussed, and they are in agreement with the care plan formulated and outlined with them.  I have encouraged them to ask questions as they arise throughout their visit.    I, Raelyn Mora, MD, am the attending of record for this patient encounter.    ED Orders Placed/Medications Administered During ED Course:   Orders Placed This Encounter   ??? CBC WITH AUTOMATED DIFF   ??? METABOLIC PANEL, COMPREHENSIVE   ??? URINALYSIS W/ REFLEX CULTURE   ??? EKG 12 LEAD INITIAL   ??? INSERT PERIPHERAL IV ONE TIME STAT   ??? levETIRAcetam in saline (iso-os) (KEPPRA) infusion 1,000 mg   ??? ondansetron (ZOFRAN) injection 8 mg   ??? diphenhydrAMINE (BENADRYL) injection 25 mg   ??? DISCONTD: promethazine (PHENERGAN) 25 mg in 0.9% sodium chloride 50 mL IVPB   ??? sodium chloride 0.9 % bolus infusion 1,000 mL   ??? oxyCODONE-acetaminophen (PERCOCET) 5-325 mg per tablet 2 Tab   ??? prochlorperazine (COMPAZINE) with saline injection 10 mg       Medications   levETIRAcetam in saline (iso-os) (KEPPRA) infusion 1,000 mg (0 mg IntraVENous IV Completed 03/17/19 0034)   ondansetron (ZOFRAN) injection 8 mg (8 mg IntraVENous Given 03/16/19 2340)   diphenhydrAMINE (BENADRYL) injection 25 mg (25 mg IntraVENous Given 03/16/19 2344)   sodium chloride 0.9 % bolus infusion 1,000 mL (0 mL IntraVENous IV Completed 03/17/19 0204)   oxyCODONE-acetaminophen (PERCOCET) 5-325 mg per tablet 2  Tab (2 Tabs Oral Given 03/17/19 0015)   prochlorperazine (COMPAZINE) with saline injection 10 mg (10 mg IntraVENous Given 03/16/19 2342)     Progress note:  Patient has been reassessed and reports feeling considerably better, has normal vital signs and feels comfortable going home. I think this is reasonable as no findings today suggest a life-threatening condition.     DISPOSITION: DISCHARGE  The patient's results have been reviewed with patient and available family and/or caregiver. They verbally convey their understanding and agreement  of the patient's signs, symptoms, diagnosis, treatment and prognosis and additionally agree to follow up as recommended in the discharge instructions or to return to the Emergency Department should the patient's condition change prior to their follow-up appointment.   The patient and available family and/or caregiver verbally agree with the care plan and all of their questions have been answered. The discharge instructions have also been provided to the them with educational information regarding the patient's diagnosis as well a list of reasons why the patient would want to return to the ER prior to their follow-up appointment should any concerns arise, the patient's condition change or symptoms worsen.    Charolotte Capuchin, MD, Msc    PLAN:  Discharge Medication List as of 03/17/2019  2:09 AM      1.   2.     Follow-up Information     Follow up With Specialties Details Why Contact Info    Louie Boston, MD [facility] Call in 2 days  [place] [address]  [phone]      [place] DEPT Emergency Medicine  As needed, If symptoms worsen [address]  [place]  [phone]        3.   Return to ED if worse       Diagnosis     Clinical Impression:   1. Intractable vomiting with nausea, unspecified vomiting type        Attestation:  I personally performed the services described in this documentation on this date 03/16/2019 for patient Carl Howell.  Charolotte Capuchin, MD    Please note that this dictation was completed with Dragon, the computer voice recognition software. Quite often unanticipated grammatical, syntax, homophones, and other interpretive errors are inadvertently transcribed by the computer software. Please disregard these errors.  Please excuse any errors that have escaped final proofreading.
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
## 2019-03-17 NOTE — ED Notes (Signed)
Pt discharged from ED; pt provided with discharged instructions and scripts. Pt instructed to return to ED if symptoms become worse. Pt instructed to f/u with PCP within 1 week. Pt verbalized understanding of all instructions.

## 2019-03-17 NOTE — ED Provider Notes (Signed)
ED Provider Notes by Raelyn Mora, MD at 03/17/19 0209                Author: Raelyn Mora, MD  Service: EMERGENCY  Author Type: Physician       Filed: 03/18/19 0301  Date of Service: 03/17/19 0209  Status: Signed          Editor: Raelyn Mora, MD (Physician)               EMERGENCY DEPARTMENT HISTORY AND PHYSICAL EXAM           Date: 03/16/2019   Patient Name: Carl Howell   Patient Age and Sex: 43 y.o.  male        History of Presenting Illness          Chief Complaint       Patient presents with        ?  Vomiting           History Provided By: Patient      HPI: Carl Howell, is a  43 y.o. male whose medical history is noted below presents to the ED with intractable nausea  and vomiting. He is usually cared for at [facility] due to history of brain cancer. Last MRI was less than a week ago. The intractable n/v is a recurrent issue for him and he has to come to the ER for treatment if unable to tolerate PO or symptoms at home. He  does not have any antiemetics left at home currently.   No new neuro complaints. States heh as had progressive left-sided deficits due to cancer, but these are old.   No fevers or chills.    Has seizures as well, on meds and reports compliance but unsure how much keppra he kept down today due to vomiting.       No diarrhea ,fevers or chills. No severe HA. No sick contacts.      Pt denies any other alleviating or exacerbating factors. There are no other complaints, changes or physical findings at this time.         Past Medical History:        Diagnosis  Date         ?  Cancer of brain [facility])           ?  Epilepsy ([place])  unknown          Past Surgical History:         Procedure  Laterality  Date          ?  BRAIN AVM SURG,DURAL,COMPLX              brain surgery - unknown had brain tumor          ?  KIDNEY DIALYSATE DELIVRY SYS              kidney surgery - unknown          ?  NEUROLOGICAL PROCEDURE UNLISTED              craniotomy           PCP: Louie Boston, MD        Past  History     Past Medical History:     Past Medical History:        Diagnosis  Date         ?  Cancer of brain [facility])           ?  Epilepsy ([place])  unknown           Past Surgical History:     Past Surgical History:         Procedure  Laterality  Date          ?  BRAIN AVM SURG,DURAL,COMPLX              brain surgery - unknown had brain tumor          ?  KIDNEY DIALYSATE DELIVRY SYS              kidney surgery - unknown          ?  NEUROLOGICAL PROCEDURE UNLISTED              craniotomy           Family History:   No family history on file.      Social History:     Social History          Tobacco Use         ?  Smoking status:  Former Smoker              Packs/day:  0.25         Types:  Cigarettes         ?  Smokeless tobacco:  Never Used       Substance Use Topics         ?  Alcohol use:  No     ?  Drug use:  Yes              Types:  Marijuana           Allergies:   No Known Allergies      Current Medications:     No current facility-administered medications on file prior to encounter.           Current Outpatient Medications on File Prior to Encounter          Medication  Sig  Dispense  Refill           ?  lacosamide (VIMPAT) 200 mg tab tablet  Take 1 Tab by mouth every twelve (12) hours. Max Daily Amount: 400 mg.  30 Tab  0     ?  melatonin 3 mg tablet  Take 1 Tab by mouth nightly as needed.  10 Tab  0     ?  oxyCODONE-acetaminophen (PERCOCET 10) 10-325 mg per tablet  Take 1 Tab by mouth every four (4) hours as needed. Max Daily Amount: 6 Tabs.  10 Tab  0     ?  senna-docusate (PERICOLACE) 8.6-50 mg per tablet  Take 1 Tab by mouth daily.  10 Tab  0     ?  diphenhydrAMINE (BENADRYL) 12.5 mg/5 mL elixir  Take 5 mL by mouth every six (6) hours as needed.  50 mL  0     ?  cyclobenzaprine (FLEXERIL) 10 mg tablet  Take 1 Tab by mouth three (3) times daily as needed for Muscle Spasm(s).  30 Tab  0     ?  venlafaxine-SR (EFFEXOR XR) 150 mg capsule  Take 150 mg by mouth daily.         ?  ACETAMINOPHEN/DIPHENHYDRAMINE  (TYLENOL PM PO)  Take 2 Tabs by mouth nightly as needed for Other (Sleep).         ?  cholecalciferol (VITAMIN D3) 1,000 unit tablet  Take 2,000 Units by mouth daily.         ?  levETIRAcetam (KEPPRA) 500 mg tablet  Take 1,750 mg by mouth two (2) times a day.         ?  LORazepam (ATIVAN) 1 mg tablet  Take 1 mg by mouth three (3) times daily as needed for Anxiety.         ?  DANDELION PO  Take 1,040 mg by mouth daily. Takes 2 capsules (520 mg each) daily               ?  ASHWAGANDHA ROOT EXTRACT,BULK,  Take 500 mg by mouth daily. Takes 2 capsules (250 mg each) daily                 Review of Systems        Review of Systems    Constitutional: Positive for fatigue. Negative for appetite change, chills and fever.    Respiratory: Negative for cough, chest tightness and shortness of breath.     Cardiovascular: Negative for chest pain, palpitations and leg swelling.    Gastrointestinal: Positive for nausea and vomiting . Negative for abdominal distention, abdominal pain, blood in stool, constipation and diarrhea.    Genitourinary: Negative for decreased urine volume, difficulty urinating, dysuria, flank pain, frequency and hematuria.    Musculoskeletal: Negative for arthralgias, joint swelling, myalgias, neck pain and neck stiffness.    Neurological: Positive for weakness. Negative for dizziness, seizures, syncope, facial asymmetry, speech difficulty, light-headedness, numbness  and headaches.    Hematological: Negative for adenopathy.    All other systems reviewed and are negative.           Physical Exam        Physical Exam   Vitals signs and nursing note reviewed.   Constitutional:        Appearance: He is not toxic-appearing.   HENT :       Mouth/Throat:      Mouth: Mucous membranes are moist.   Eyes :       General: No scleral icterus.     Extraocular Movements: Extraocular movements intact.      Conjunctiva/sclera: Conjunctivae normal.      Pupils: Pupils are equal, round, and reactive to light.   Neck:        Musculoskeletal: Normal range of motion and neck supple.      Vascular: No JVD.    Cardiovascular:       Rate and Rhythm: Normal rate and regular rhythm.      Pulses: Normal pulses.      Heart sounds: Normal heart sounds.    Pulmonary:       Effort: Pulmonary effort is normal.      Breath sounds: Normal breath sounds.   Chest:       Chest wall: No tenderness.   Abdominal :      General: Bowel sounds are normal. There is no distension.      Palpations: Abdomen is soft.      Tenderness: There is no abdominal tenderness.     Musculoskeletal: Normal range of motion.          General: No swelling or tenderness.      Right lower leg: No edema.      Left lower leg: No edema.    Skin:      General: Skin is warm and dry.      Capillary Refill: Capillary refill takes less than 2 seconds.      Findings: No erythema or rash.   Neurological:  General: No focal deficit present.      Mental Status: Mental status is at baseline.      Cranial Nerves: Cranial nerves are intact.      Motor: Motor function is intact.   Psychiatric:         Mood and Affect: Mood normal.         Behavior: Behavior  normal.               Diagnostic Study Results        Labs - I have personally reviewed and interpreted all laboratory results. Charolotte Capuchin, MD, MSc     Recent Results (from the past 24 hour(s))     CBC WITH AUTOMATED DIFF          Collection Time: 03/16/19  9:57 PM         Result  Value  Ref Range            WBC  10.0  4.1 - 11.1 K/uL       RBC  4.54  4.10 - 5.70 M/uL       HGB  14.9  12.1 - 17.0 g/dL       HCT  41.8  36.6 - 50.3 %       MCV  92.1  80.0 - 99.0 FL       MCH  32.8  26.0 - 34.0 PG       MCHC  35.6  30.0 - 36.5 g/dL       RDW  12.5  11.5 - 14.5 %       PLATELET  280  150 - 400 K/uL       MPV  11.4  8.9 - 12.9 FL       NRBC  0.0  0 PER 100 WBC       ABSOLUTE NRBC  0.00  0.00 - 0.01 K/uL       NEUTROPHILS  84 (H)  32 - 75 %       LYMPHOCYTES  10 (L)  12 - 49 %       MONOCYTES  5  5 - 13 %       EOSINOPHILS  0  0 - 7 %        BASOPHILS  0  0 - 1 %       IMMATURE GRANULOCYTES  1 (H)  0.0 - 0.5 %       ABS. NEUTROPHILS  8.4 (H)  1.8 - 8.0 K/UL       ABS. LYMPHOCYTES  1.0  0.8 - 3.5 K/UL       ABS. MONOCYTES  0.5  0.0 - 1.0 K/UL       ABS. EOSINOPHILS  0.0  0.0 - 0.4 K/UL       ABS. BASOPHILS  0.0  0.0 - 0.1 K/UL       ABS. IMM. GRANS.  0.1 (H)  0.00 - 0.04 K/UL       DF  AUTOMATED          METABOLIC PANEL, COMPREHENSIVE          Collection Time: 03/16/19  9:57 PM         Result  Value  Ref Range            Sodium  132 (L)  136 - 145 mmol/L       Potassium  4.2  3.5 - 5.1 mmol/L  Chloride  98  97 - 108 mmol/L            CO2  22  21 - 32 mmol/L       Anion gap  12  5 - 15 mmol/L       Glucose  144 (H)  65 - 100 mg/dL       BUN  9  6 - 20 MG/DL       Creatinine  1.09  0.70 - 1.30 MG/DL       BUN/Creatinine ratio  8 (L)  12 - 20         GFR est AA  >60  >60 ml/min/1.41m       GFR est non-AA  >60  >60 ml/min/1.746m      Calcium  9.8  8.5 - 10.1 MG/DL       Bilirubin, total  0.8  0.2 - 1.0 MG/DL       ALT (SGPT)  47  12 - 78 U/L       AST (SGOT)  21  15 - 37 U/L       Alk. phosphatase  64  45 - 117 U/L       Protein, total  8.5 (H)  6.4 - 8.2 g/dL       Albumin  4.2  3.5 - 5.0 g/dL       Globulin  4.3 (H)  2.0 - 4.0 g/dL            A-G Ratio  1.0 (L)  1.1 - 2.2             Radiologic Studies - I have personally reviewed and interpreted all imaging studies and agree with radiology interpretation and report. - TaCharolotte CapuchinMD, MSc     No orders to display                Medical Decision Making     I am the first provider for this patient.      Records Reviewed: I reviewed our electronic medical record system for any past medical records that were available that may contribute to the  patient's current condition, including their PMH, surgical history, social and family history. Reviewed the nursing notes and vital signs from today's visit.    Nursing notes will be reviewed as they become available in realtime while the pt has been in  the ED.   TaCharolotte CapuchinMD Msc      Vital Signs-Reviewed the patient's vital signs.   Patient Vitals for the past 24 hrs:            Temp  Pulse  Resp  BP  SpO2            03/16/19 2008  97.6 ??F (36.4 ??C)  83  18  (!) 149/105  100 %        Provider Notes (Medical Decision Making):    Patient with known brain ca presents with intractable n/v. Given that this is a recurrent issue for him, I have a low suspicion for other etiologies, such as gastroenteritis, ileus, sbo or other acute  illness.   VSS. Completely benign abdominal exam.   Since he recently had an Mri, I do not feel compelled to order another imaging study just yet. Patient is nrurolgically at his baseline. Will treat symptoms and reassess. Get basic electrolytes to make sure he is not severely dehydrated and that his k  is normal.  If he feels better after treatment, ok to go home.       Reassessment: patient asking to go home. All sympotms resolved   ED Course:    Initial assessment performed. The patients presenting problems have been discussed, and they are in agreement with the care plan formulated and outlined with them.  I have encouraged them to ask questions as they arise throughout their visit.      I, Raelyn Mora, MD, am the attending of record for this patient encounter.      ED Orders Placed/Medications Administered During ED Course:      Orders Placed This Encounter        ?  CBC WITH AUTOMATED DIFF     ?  METABOLIC PANEL, COMPREHENSIVE     ?  URINALYSIS W/ REFLEX CULTURE     ?  EKG 12 LEAD INITIAL     ?  INSERT PERIPHERAL IV ONE TIME STAT     ?  levETIRAcetam in saline (iso-os) (KEPPRA) infusion 1,000 mg     ?  ondansetron (ZOFRAN) injection 8 mg     ?  diphenhydrAMINE (BENADRYL) injection 25 mg     ?  DISCONTD: promethazine (PHENERGAN) 25 mg in 0.9% sodium chloride 50 mL IVPB     ?  sodium chloride 0.9 % bolus infusion 1,000 mL     ?  oxyCODONE-acetaminophen (PERCOCET) 5-325 mg per tablet 2 Tab        ?  prochlorperazine (COMPAZINE)  with saline injection 10 mg             Medications       levETIRAcetam in saline (iso-os) (KEPPRA) infusion 1,000 mg (0 mg IntraVENous IV Completed 03/17/19 0034)     ondansetron (ZOFRAN) injection 8 mg (8 mg IntraVENous Given 03/16/19 2340)     diphenhydrAMINE (BENADRYL) injection 25 mg (25 mg IntraVENous Given 03/16/19 2344)     sodium chloride 0.9 % bolus infusion 1,000 mL (0 mL IntraVENous IV Completed 03/17/19 0204)     oxyCODONE-acetaminophen (PERCOCET) 5-325 mg per tablet 2 Tab (2 Tabs Oral Given 03/17/19 0015)       prochlorperazine (COMPAZINE) with saline injection 10 mg (10 mg IntraVENous Given 03/16/19 2342)        Progress note:   Patient has been reassessed and reports feeling considerably better, has normal vital signs and feels comfortable going home. I think this is reasonable as no findings today suggest a life-threatening  condition.       DISPOSITION: DISCHARGE   The patient's results have been reviewed with patient and available family and/or caregiver. They verbally convey their understanding and agreement of the patient's signs, symptoms,  diagnosis, treatment and prognosis and additionally agree to follow up as recommended in the discharge instructions or to return to the Emergency Department should the patient's condition change prior to their follow-up appointment.    The patient and available family and/or caregiver verbally agree with the care plan and all of their questions have been answered. The discharge instructions have also been provided to the them with educational information regarding the patient's diagnosis  as well a list of reasons why the patient would want to return to the ER prior to their follow-up appointment should any concerns arise, the patient's condition change or symptoms worsen.      Charolotte Capuchin, MD, Msc      PLAN:     Discharge Medication List as of 03/17/2019  2:09 AM  1.     2.        Follow-up Information               Follow up With  Specialties  Details  Why   Contact Info              Louie Boston, MD  [facility]  Call in 2 days    [place] [id number]   [phone]                 [facility] EMERGENCY DEPT  Emergency Medicine    As needed, If symptoms worsen  [address]   [place]   [phone]             3.   Return to ED if worse            Diagnosis        Clinical Impression:       1.  Intractable vomiting with nausea, unspecified vomiting type            Attestation:   I personally performed the services described in this documentation on this date 03/16/2019 for patient Carl Howell.  Charolotte Capuchin, MD      Please note that this dictation was completed with Dragon, the computer voice recognition software. Quite often unanticipated grammatical, syntax, homophones, and other interpretive errors are  inadvertently transcribed by the computer software. Please disregard these errors.  Please excuse any errors that have escaped final proofreading.

## 2019-03-17 NOTE — ED Notes (Signed)
Attempted to perform EKG. Pt stated "I'd rather not. I'm still recovering from loss of skin from the last one you guys did, no thank you". Informed pt the reason the provider ordered the test & he stated "no, I don't think I want to do that". Primary RN notified.

## 2019-03-18 NOTE — Telephone Encounter (Addendum)
03/18/19 2:44 PM--attempted all 3 numbers listed in demographics.  His home # is unable to be completed as dialed, his cell # goes to a fast busy signal and the number listed for his mother rings and disconnects without a VM message.  Will try to reach once more tomorrow.    03/19/19 1:43 PM     Patient contacted regarding recent discharge and COVID-19 risk   Care Transition Nurse/ Ambulatory Care Manager contacted the patient by telephone to perform post discharge assessment. Verified name and DOB with patient as identifiers.     Patient has following risk factors of: pneumonia. CTN/ACM reviewed discharge instructions, medical action plan and red flags related to discharge diagnosis. Reviewed and educated them on any new and changed medications related to discharge diagnosis.  Advised obtaining a 90-day supply of all daily and as-needed medications.     Education provided regarding infection prevention, and signs and symptoms of COVID-19 and when to seek medical attention with patient who verbalized understanding. Discussed exposure protocols and quarantine from [facility] Guidelines ???Are you at higher risk for severe illness 2019??? and given an opportunity for questions and concerns. The patient agrees to contact the COVID-19 hotline [phone] or PCP office for questions related to their healthcare. CTN/ACM provided contact information for future reference.    From CDC: Are you at higher risk for severe illness?    ??? Wash your hands often.  ??? Avoid close contact (6 feet, which is about two arm lengths) with people who are sick.  ??? Put distance between yourself and other people if COVID-19 is spreading in your community.  ??? Clean and disinfect frequently touched surfaces.  ??? Avoid all cruise travel and non-essential air travel.  ??? Call your healthcare professional if you have concerns about COVID-19 and your underlying condition or if you are sick.    For more information on steps you can take to protect yourself, see CDC's  How to Protect Yourself      Patient/family/caregiver given information for [employer] Loop and agrees to enroll no  Patient's preferred e-mail:  N/A  Patient's preferred phone number: N/A  Based on Loop alert triggers, patient will be contacted by nurse care manager for worsening symptoms.    Plan for follow-up call in 7-14 days based on severity of symptoms and risk factors.

## 2019-04-03 NOTE — Telephone Encounter (Signed)
04/03/19 4:20 PM     Patient resolved from Transition of Care episode on 04/03/19.  Patient/family has been provided the following resources and education related to COVID-19:                         Signs, symptoms and red flags related to COVID-19            CDC exposure and quarantine guidelines            [facility] exposure contact - [phone]            Contact for their local Department of Health                 Patient currently reports that the following symptoms have improved:  vomiting.    No further outreach scheduled with this CTN/ACM.  Episode of Care resolved.  Patient has this CTN/ACM contact information if future needs arise.

## 2019-11-22 ENCOUNTER — Encounter: Payer: Self-pay | Admitting: Emergency Medicine

## 2019-11-22 ENCOUNTER — Emergency Department: Payer: Medicare Other

## 2019-11-22 ENCOUNTER — Other Ambulatory Visit: Payer: Self-pay

## 2019-11-22 ENCOUNTER — Emergency Department
Admission: EM | Admit: 2019-11-22 | Discharge: 2019-11-22 | Disposition: A | Discharge status: Home / Self Care | Payer: Medicare Other | Attending: Emergency Medicine | Admitting: Emergency Medicine

## 2019-11-22 DIAGNOSIS — C719 Malignant neoplasm of brain, unspecified: Secondary | ICD-10-CM

## 2019-11-22 DIAGNOSIS — G40209 Localization-related (focal) (partial) symptomatic epilepsy and epileptic syndromes with complex partial seizures, not intractable, without status epilepticus: Secondary | ICD-10-CM | POA: Diagnosis not present

## 2019-11-22 DIAGNOSIS — Z85841 Personal history of malignant neoplasm of brain: Secondary | ICD-10-CM | POA: Insufficient documentation

## 2019-11-22 DIAGNOSIS — R569 Unspecified convulsions: Secondary | ICD-10-CM | POA: Diagnosis not present

## 2019-11-22 DIAGNOSIS — Z87891 Personal history of nicotine dependence: Secondary | ICD-10-CM | POA: Diagnosis not present

## 2019-11-22 DIAGNOSIS — R112 Nausea with vomiting, unspecified: Secondary | ICD-10-CM | POA: Diagnosis present

## 2019-11-22 HISTORY — DX: Malignant neoplasm of brain, unspecified: C71.9

## 2019-11-22 HISTORY — DX: Unspecified convulsions: R56.9

## 2019-11-22 LAB — CBC WITH DIFFERENTIAL/PLATELET
Abs Immature Granulocytes: 0.09 10*3/uL — ABNORMAL HIGH (ref 0.00–0.07)
Basophils Absolute: 0 10*3/uL (ref 0.0–0.1)
Basophils Relative: 0 %
Eosinophils Absolute: 0 10*3/uL (ref 0.0–0.5)
Eosinophils Relative: 0 %
HCT: 40.6 % (ref 39.0–52.0)
Hemoglobin: 14.4 g/dL (ref 13.0–17.0)
Immature Granulocytes: 1 %
Lymphocytes Relative: 7 %
Lymphs Abs: 1.1 10*3/uL (ref 0.7–4.0)
MCH: 33.6 pg (ref 26.0–34.0)
MCHC: 35.5 g/dL (ref 30.0–36.0)
MCV: 94.6 fL (ref 80.0–100.0)
Monocytes Absolute: 0.8 10*3/uL (ref 0.1–1.0)
Monocytes Relative: 5 %
Neutro Abs: 15 10*3/uL — ABNORMAL HIGH (ref 1.7–7.7)
Neutrophils Relative %: 87 %
Platelets: 383 10*3/uL (ref 150–400)
RBC: 4.29 MIL/uL (ref 4.22–5.81)
RDW: 11.4 % — ABNORMAL LOW (ref 11.5–15.5)
WBC: 17 10*3/uL — ABNORMAL HIGH (ref 4.0–10.5)
nRBC: 0 % (ref 0.0–0.2)

## 2019-11-22 LAB — COMPREHENSIVE METABOLIC PANEL
ALT: 34 U/L (ref 0–44)
AST: 36 U/L (ref 15–41)
Albumin: 4.4 g/dL (ref 3.5–5.0)
Alkaline Phosphatase: 77 U/L (ref 38–126)
Anion gap: 16 — ABNORMAL HIGH (ref 5–15)
BUN: 7 mg/dL (ref 6–20)
CO2: 20 mmol/L — ABNORMAL LOW (ref 22–32)
Calcium: 9.8 mg/dL (ref 8.9–10.3)
Chloride: 100 mmol/L (ref 98–111)
Creatinine, Ser: 1.01 mg/dL (ref 0.61–1.24)
GFR calc Af Amer: >60 mL/min (ref >60)
GFR calc non Af Amer: >60 mL/min (ref >60)
Glucose, Bld: 150 mg/dL — ABNORMAL HIGH (ref 70–99)
Potassium: 3.9 mmol/L (ref 3.5–5.1)
Sodium: 136 mmol/L (ref 135–145)
Total Bilirubin: 1.2 mg/dL (ref 0.3–1.2)
Total Protein: 8.5 g/dL — ABNORMAL HIGH (ref 6.5–8.1)

## 2019-11-22 LAB — LIPASE, BLOOD: Lipase: 26 U/L (ref 11–51)

## 2019-11-22 LAB — VALPROIC ACID LEVEL: Valproic Acid Lvl: 37 ug/mL — ABNORMAL LOW (ref 50.0–100.0)

## 2019-11-22 MED ORDER — DIVALPROEX SODIUM 500 MG PO DR TAB: 500.0000 mg | DELAYED_RELEASE_TABLET | Freq: Two times a day (BID) | ORAL | 1 refills | Status: DC

## 2019-11-22 MED ORDER — MORPHINE SULFATE (PF) 4 MG/ML IV SOLN
4.0000 mg | Freq: Once | INTRAVENOUS | Status: AC
  Administered 2019-11-22: 4 mg via INTRAVENOUS
  Filled 2019-11-22: qty 1

## 2019-11-22 MED ORDER — LACTATED RINGERS IV BOLUS
1000.0000 mL | Freq: Once | INTRAVENOUS | Status: AC
  Administered 2019-11-22: 1000 mL via INTRAVENOUS

## 2019-11-22 MED ORDER — VALPROATE SODIUM 500 MG/5ML IV SOLN
500.0000 mg | Freq: Once | INTRAVENOUS | Status: AC
  Administered 2019-11-22: 500 mg via INTRAVENOUS
  Filled 2019-11-22: qty 5

## 2019-11-22 MED ORDER — ONDANSETRON 4 MG PO TBDP: 4.0000 mg | ORAL_TABLET | Freq: Once | ORAL | Status: DC

## 2019-11-22 MED ORDER — ONDANSETRON HCL 4 MG/2ML IJ SOLN
4.0000 mg | Freq: Once | INTRAMUSCULAR | Status: AC
  Administered 2019-11-22: 4 mg via INTRAVENOUS
  Filled 2019-11-22: qty 2

## 2019-11-22 MED ORDER — LORAZEPAM 2 MG/ML IJ SOLN
1.0000 mg | Freq: Once | INTRAMUSCULAR | Status: AC
  Administered 2019-11-22: 08:00:00 1 mg via INTRAVENOUS
  Filled 2019-11-22: qty 1

## 2019-11-22 NOTE — Consult Note (Signed)
Reason for Consult: seizure activity  Referring Physician: Dr. Charna Archer   CC: seizure activity   HPI: Cory Hayes is an 44 y.o. male with past medical history of astrocytoma s/p resection, hx of  seizures on VPA 750 BID and Keppra 2g BID who presents to the ED complaining of seizure and vomiting.  Information obtained from mother as patient is post ictal.  Last seizure was in March of 2020 and as per mom he is compliant with his mediations.  He did take his medications this AM but vomited after which he had a generalized seizures and now post ictal.  Slow to respond.  Patient is s/p ativan.    Past Medical History:  Diagnosis Date  . 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[place])   . Seizures ([place])     Past Surgical History:  Procedure Laterality Date  . CRANIOTOMY      No family history on file.  Social History:  reports that he has quit smoking. He has never used smokeless tobacco. No history on file for alcohol and drug.  No Known Allergies  Medications: I have reviewed the patient's current medications.  ROS: Unable to obtain as post ictal   Physical Examination: Blood pressure (!) 141/98, pulse (!) 104, resp. rate 20, height 5\' 7"  (1.702 m), weight 56.7 kg, SpO2 99 %.  Withdraws from painful stimuli Pupils sluggish but reactive Corneals presented Appears stronger on R then L which is chronic  Does not follow commands.    Laboratory Studies:   Basic Metabolic Panel: Recent Labs  Lab 11/22/19 0710  NA 136  K 3.9  CL 100  CO2 20*  GLUCOSE 150*  BUN 7  CREATININE 1.01  CALCIUM 9.8    Liver Function Tests: Recent Labs  Lab 11/22/19 0710  AST 36  ALT 34  ALKPHOS 77  BILITOT 1.2  PROT 8.5*  ALBUMIN 4.4   Recent Labs  Lab 11/22/19 0710  LIPASE 26   No results for input(s): AMMONIA in the last 168 hours.  CBC: Recent Labs  Lab 11/22/19 0710  WBC 17.0*  NEUTROABS 15.0*  HGB 14.4  HCT 40.6  MCV 94.6  PLT 383    Cardiac Enzymes: No results for input(s): CKTOTAL, CKMB,  CKMBINDEX, TROPONINI in the last 168 hours.  BNP: Invalid input(s): POCBNP  CBG: No results for input(s): GLUCAP in the last 168 hours.  Microbiology: No results found for this or any previous visit.  Coagulation Studies: No results for input(s): LABPROT, INR in the last 72 hours.  Urinalysis: No results for input(s): COLORURINE, LABSPEC, PHURINE, GLUCOSEU, HGBUR, BILIRUBINUR, KETONESUR, PROTEINUR, UROBILINOGEN, NITRITE, LEUKOCYTESUR in the last 168 hours.  Invalid input(s): APPERANCEUR  Lipid Panel:  No results found for: CHOL, TRIG, HDL, CHOLHDL, VLDL, LDLCALC  HgbA1C: No results found for: HGBA1C  Urine Drug Screen:  No results found for: LABOPIA, COCAINSCRNUR, LABBENZ, AMPHETMU, THCU, LABBARB  Alcohol Level: No results for input(s): ETH in the last 168 hours.  Other results: EKG: normal EKG, normal sinus rhythm, unchanged from previous tracings.  Imaging: CT Head Wo Contrast  Result Date: 11/22/2019 CLINICAL DATA:  44 year old male with history of astrocytoma, craniotomy, seizures. Status post seizure this morning with vomiting. EXAM: CT HEAD WITHOUT CONTRAST TECHNIQUE: Contiguous axial images were obtained from the base of the skull through the vertex without intravenous contrast. COMPARISON:  None available. FINDINGS: Brain: Right parietal and posterior frontal lobe resection cavity with no regional mass effect. Superimposed confluent bilateral cerebral white matter hypodensity. There is some  generalized volume loss for age. No midline shift, ventriculomegaly, mass effect, evidence of mass lesion, intracranial hemorrhage or evidence of cortically based acute infarction. Vascular: Mild Calcified atherosclerosis at the skull base. No suspicious intracranial vascular hyperdensity. Skull: Previous right parietal craniotomy. No acute osseous abnormality identified. Sinuses/Orbits: Visualized paranasal sinuses and mastoids are clear. Other: No acute orbit or scalp soft tissue finding.  IMPRESSION: 1. No definite acute intracranial abnormality. 2. Resection cavity centered at the right parietal lobe with no regional mass effect or strong evidence of residual tumor on this noncontrast exam. 3. Superimposed widespread cerebral white matter hypodensity might be sequelae of XRT in this setting. Electronically Signed   By: Genevie Ann M.D.   On: 11/22/2019 08:49     Assessment/Plan:  44 y.o. male with past medical history of astrocytoma s/p resection, hx of  seizures on VPA 750 BID and Keppra 2g BID who presents to the ED complaining of seizure and vomiting.  Information obtained from mother as patient is post ictal.  Last seizure was in March of 2020 and as per mom he is compliant with his mediations.  He did take his medications this AM but vomited after which he had a generalized seizures and now post ictal.  Slow to respond.  Patient is s/p ativan.    - Post ictal currently. As per mom this is common as it takes him at least a few hours to wake up.  - Pt is now followed at [facility] but prior was followed in [place] for his epilepsy and astrocytoma - CTH no acute abnormalities  - Lab work up with elevated WBC likely in setting of seizure activity - Con't Keppra 2g BID - VPA level low at 37.  500mg  IV now and then increase to 1g bid from 750mg  BID if able to swallow - observation. If wakes up in next few hours and close to baseline then suspect can be d/c otherwise would obtain an EEG if still post ictal this afternoon  - discussion with mother at bedside.   11/22/2019, 10:26 AM

## 2019-11-22 NOTE — ED Triage Notes (Addendum)
Pt to triage via w/c, brought in by EMS from home; reports N/V this morning; denies abd pain; hx astrocytoma, craniotomy and seizures; reports having seizure this morning; pt actively vomiting, shaking of extremities and very unsteady with standing

## 2019-11-22 NOTE — ED Provider Notes (Signed)
[facility] Emergency Department Provider Note   ____________________________________________   First MD Initiated Contact with Patient 11/22/19 [phone]     (approximate)  I have reviewed the triage vital signs and the nursing notes.   HISTORY  Chief Complaint Emesis    HPI Cory Hayes is a 44 y.o. male with past medical history of astrocytoma and seizures who presents to the ED complaining of seizure and vomiting.  Patient states that he woke up this morning feeling ill with nausea and multiple episodes of nonbilious and nonbloody emesis.  Afterwards, he reports having a focal left-sided seizure, similar to what he has experienced in the past and lasting for about 20 seconds.  He does not typically lose consciousness with his seizures, states his last seizure was in early 2020 and he has been compliant with his Depakote and Keppra.  He denies falling or hitting his head with a seizure, but has developed a diffuse headache after the episode.  He vomited once upon arrival to the ED, feels very shaky, but denies any abdominal pain.  He denies any recent fevers, chills, cough, chest pain, or shortness of breath.  He denies any alcohol abuse, admits to occasional marijuana use but otherwise denies drug abuse.  He has left-sided weakness at baseline and states this is unchanged today.        Past Medical History:  Diagnosis Date  . 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[place])   . Seizures ([place])     There are no problems to display for this patient.   Past Surgical History:  Procedure Laterality Date  . CRANIOTOMY      Prior to Admission medications   Medication Sig Start Date End Date Taking? Authorizing Provider  divalproex (DEPAKOTE) 500 MG DR tablet Take 1 tablet (500 mg total) by mouth 2 (two) times daily. 11/22/19 01/21/20  Blake Divine, MD    Allergies Patient has no known allergies.  No family history on file.  Social History Social History   Tobacco Use  . Smoking  status: Former Research scientist (life sciences)  . Smokeless tobacco: Never Used  Substance Use Topics  . Alcohol use: Not on file  . Drug use: Not on file    Review of Systems  Constitutional: No fever/chills Eyes: No visual changes. ENT: No sore throat. Cardiovascular: Denies chest pain. Respiratory: Denies shortness of breath. Gastrointestinal: No abdominal pain.  Positive for vomiting.  No diarrhea.  No constipation. Genitourinary: Negative for dysuria. Musculoskeletal: Negative for back pain. Skin: Negative for rash. Neurological: Positive for headaches, negative for new focal weakness or numbness.  Positive for seizure.  ____________________________________________   PHYSICAL EXAM:  VITAL SIGNS: ED Triage Vitals  Enc Vitals Group     BP 11/22/19 0658 (!) 141/98     Pulse Rate 11/22/19 0658 (!) 104     Resp 11/22/19 0658 20     Temp --      Temp src --      SpO2 11/22/19 0645 97 %     Weight 11/22/19 0655 125 lb (56.7 kg)     Height 11/22/19 0655 5\' 7"  (1.702 m)     Head Circumference --      Peak Flow --      Pain Score 11/22/19 0647 0     Pain Loc --      Pain Edu? --      Excl. in [place]? --     Constitutional: Alert and oriented.  Tremulous and diaphoretic. Eyes: Conjunctivae are normal.  Pupils  equal round and reactive to light bilaterally, extraocular movements intact. Head: Atraumatic.  Craniotomy scar. Nose: No congestion/rhinnorhea. Mouth/Throat: Mucous membranes are moist. Neck: Normal ROM Cardiovascular: Normal rate, regular rhythm. Grossly normal heart sounds. Respiratory: Normal respiratory effort.  No retractions. Lungs CTAB. Gastrointestinal: Soft and nontender. No distention. Genitourinary: deferred Musculoskeletal: No lower extremity tenderness nor edema. Neurologic:  Normal speech and language.  4 out of 5 strength in left upper and lower extremities, at patient's reported baseline with 5 out of 5 strength in right upper and lower extremities. Skin:  Skin is warm, dry  and intact. No rash noted. Psychiatric: Mood and affect are normal. Speech and behavior are normal.  ____________________________________________   LABS (all labs ordered are listed, but only abnormal results are displayed)  Labs Reviewed  CBC WITH DIFFERENTIAL/PLATELET - Abnormal; Notable for the following components:      Result Value   WBC 17.0 (*)    RDW 11.4 (*)    Neutro Abs 15.0 (*)    Abs Immature Granulocytes 0.09 (*)    All other components within normal limits  COMPREHENSIVE METABOLIC PANEL - Abnormal; Notable for the following components:   CO2 20 (*)    Glucose, Bld 150 (*)    Total Protein 8.5 (*)    Anion gap 16 (*)    All other components within normal limits  VALPROIC ACID LEVEL - Abnormal; Notable for the following components:   Valproic Acid Lvl 37 (*)    All other components within normal limits  LIPASE, BLOOD  URINALYSIS, COMPLETE (UACMP) WITH MICROSCOPIC  LEVETIRACETAM LEVEL   ____________________________________________  EKG  ED ECG REPORT I, Blake Divine, the attending physician, personally viewed and interpreted this ECG.   Date: 11/22/2019  EKG Time: 7:37  Rate: 96  Rhythm: normal sinus rhythm  Axis: Normal  Intervals:none  ST&T Change: None   PROCEDURES  Procedure(s) performed (including Critical Care):  Procedures   ____________________________________________   INITIAL IMPRESSION / ASSESSMENT AND PLAN / ED COURSE       44 year old male with history of astrocytoma status post craniotomy and resection as well as focal seizures presents to the ED complaining of seizure episode this morning preceded by nausea and vomiting, now with headache afterwards.  He reports to me that he has weakness on his left side at baseline and that this is unchanged today.  He does appear very tremulous and diaphoretic but does not appear to have any new focal neurologic deficits.  Given his headache, will assess with CT of his head.  When asked if he  feels like he is having ongoing seizure activity he states "a little".  We will give a dose of Ativan for prophylaxis against further seizure as he states he already took his morning Keppra and Depakote after the seizure episode.  Will check labs, including LFTs and lipase, as well as Keppra and Depakote levels.  He is no longer actively vomiting after a dose of Zofran.  CT head is negative for acute process, shows expected changes following his multiple craniotomies.  Lab work shows his Depakote levels to be low, but is otherwise unremarkable.  Case was discussed with neurology, who recommends giving 500 mg IV dose of Depakote here in the ED and increasing his Depakote dosage to 1000 mg twice daily.  I was able to reach his mother over the phone, who states that he typically has prolonged postictal periods.  Mother was able to come visit him in the ED and states  that he has had gradual improvement during her time visiting with him, now seems to be back to his baseline.  He is appropriate for discharge home with close follow-up with [facility] neurology.  Patient and mother agree with plan.      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   Blake Divine, MD 11/22/19 [phone]

## 2019-11-22 NOTE — ED Notes (Signed)
Report from [employer], RN at this time.

## 2019-11-22 NOTE — ED Notes (Signed)
Patient still drowsy at this time. Confusion comes and goes. Mom at bedside reports this is normal for patients post seizure behavior.

## 2019-11-29 LAB — LEVETIRACETAM LEVEL: Levetiracetam Lvl: 44.2 ug/mL — ABNORMAL HIGH (ref 10.0–40.0)

## 2020-02-24 ENCOUNTER — Ambulatory Visit: Payer: Medicare Other

## 2020-11-26 DIAGNOSIS — Z006 Encounter for examination for normal comparison and control in clinical research program: Secondary | ICD-10-CM | POA: Insufficient documentation

## 2020-12-22 DIAGNOSIS — G9389 Other specified disorders of brain: Secondary | ICD-10-CM | POA: Insufficient documentation

## 2020-12-22 DIAGNOSIS — E871 Hypo-osmolality and hyponatremia: Secondary | ICD-10-CM | POA: Insufficient documentation

## 2021-01-18 ENCOUNTER — Telehealth: Payer: Self-pay | Admitting: Internal Medicine

## 2021-01-18 NOTE — Telephone Encounter (Signed)
Drew with [facility] is calling to see if dr Neomia Dear will sign home health order prior to pt visit on 01-26-2021

## 2021-01-21 ENCOUNTER — Encounter: Payer: Self-pay | Admitting: Internal Medicine

## 2021-01-21 ENCOUNTER — Other Ambulatory Visit: Payer: Self-pay

## 2021-01-21 ENCOUNTER — Ambulatory Visit (INDEPENDENT_AMBULATORY_CARE_PROVIDER_SITE_OTHER): Payer: Medicare Other | Admitting: Internal Medicine

## 2021-01-21 VITALS — BP 119/76 | HR 76 | Temp 98.0°F | Ht 68.0 in | Wt 119.0 lb

## 2021-01-21 DIAGNOSIS — C719 Malignant neoplasm of brain, unspecified: Secondary | ICD-10-CM | POA: Diagnosis not present

## 2021-01-21 DIAGNOSIS — G894 Chronic pain syndrome: Secondary | ICD-10-CM | POA: Diagnosis not present

## 2021-01-21 DIAGNOSIS — M549 Dorsalgia, unspecified: Secondary | ICD-10-CM

## 2021-01-21 DIAGNOSIS — Z9889 Other specified postprocedural states: Secondary | ICD-10-CM | POA: Diagnosis not present

## 2021-01-21 DIAGNOSIS — G40909 Epilepsy, unspecified, not intractable, without status epilepticus: Secondary | ICD-10-CM

## 2021-01-21 DIAGNOSIS — R609 Edema, unspecified: Secondary | ICD-10-CM

## 2021-01-21 NOTE — Progress Notes (Signed)
BP 119/76   Pulse 76   Temp 98 F (36.7 C) (Oral)   SpO2 99%    Subjective:    Patient ID: Cory Hayes, male    DOB: [birth year], 45 y.o.   MRN: [id number]  HPI: Cory Hayes is a 45 y.o. male  Pt is a new to the practice moved from [place] in 2020 sec to family being in [place].  Was on keppra and vimpat - cost of meds needed a change to Depakote was stable in terms of seizures. Maybe one every 6 months. Sees [facility] x 1 yr ago -MRI was done every 3-6 months, seizures now last one was January 2022, surg x feb 7th 2022  took out more in 2020 ,  first surg 2008 then 2015 for astrocytoma  [place] down in 2017 and fracture of  Thoracic spine has had surg in the past and went through PT for this helped per pt.    Foot swelling left lower ext : left foot was in [facility] didn't have any compression stockings -  Had dysphagia was on a restircted diet   Seizures  This is a chronic problem. Pertinent negatives include no confusion, no headaches, no speech difficulty, no chest pain, no cough, no nausea, no vomiting and no diarrhea.    Chief Complaint  Patient presents with  . Establish Care    Pt states wants to discuss getting a handicap form    Relevant past medical, surgical, family and social history reviewed and updated as indicated. Interim medical history since our last visit reviewed. Allergies and medications reviewed and updated.  Review of Systems  Constitutional: Negative for activity change, appetite change, chills, fatigue and fever.  HENT: Negative for congestion, ear discharge, ear pain and facial swelling.   Eyes: Negative for pain, discharge and itching.  Respiratory: Negative for cough, chest tightness, shortness of breath and wheezing.   Cardiovascular: Negative for chest pain, palpitations and leg swelling.  Gastrointestinal: Negative for abdominal distention, abdominal pain, blood in stool, constipation, diarrhea, nausea and vomiting.  Endocrine:  Negative for cold intolerance, heat intolerance, polydipsia, polyphagia and polyuria.  Genitourinary: Negative for difficulty urinating, dysuria, flank pain, frequency, hematuria and urgency.  Musculoskeletal: Negative for arthralgias, gait problem, joint swelling and myalgias.  Skin: Negative for color change, rash and wound.  Neurological: Positive for seizures and weakness. Negative for dizziness, tremors, speech difficulty, light-headedness, numbness and headaches.  Psychiatric/Behavioral: Negative for agitation, confusion, decreased concentration, sleep disturbance and suicidal ideas.    Per HPI unless specifically indicated above     Objective:    BP 119/76   Pulse 76   Temp 98 F (36.7 C) (Oral)   SpO2 99%   Wt Readings from Last 3 Encounters:  11/22/19 125 lb (56.7 kg)    Physical Exam Vitals and nursing note reviewed.  Constitutional:      General: He is not in acute distress.    Appearance: Normal appearance. He is not ill-appearing or diaphoretic.  HENT:     Head: Normocephalic and atraumatic.     Right Ear: Tympanic membrane and external ear normal. There is no impacted cerumen.     Left Ear: External ear normal.     Nose: No congestion or rhinorrhea.     Mouth/Throat:     Pharynx: No oropharyngeal exudate or posterior oropharyngeal erythema.  Eyes:     Conjunctiva/sclera: Conjunctivae normal.     Pupils: Pupils are equal, round, and reactive to light.  Cardiovascular:     Rate and Rhythm: Normal rate and regular rhythm.     Heart sounds: No murmur heard. No friction rub. No gallop.   Pulmonary:     Effort: No respiratory distress.     Breath sounds: No stridor. No wheezing or rhonchi.  Chest:     Chest wall: No tenderness.  Abdominal:     General: Abdomen is flat. Bowel sounds are normal. There is no distension.     Palpations: Abdomen is soft. There is no mass.     Tenderness: There is no abdominal tenderness. There is no guarding.  Musculoskeletal:         General: No swelling or deformity.     Cervical back: Normal range of motion and neck supple. No rigidity or tenderness.     Right lower leg: No edema.     Left lower leg: No edema.  Skin:    General: Skin is warm and dry.     Coloration: Skin is not jaundiced.     Findings: No erythema.  Neurological:     Mental Status: He is alert. Mental status is at baseline.     Motor: Weakness present.  Psychiatric:        Mood and Affect: Mood normal.        Behavior: Behavior normal.        Thought Content: Thought content normal.        Judgment: Judgment normal.     Results for orders placed or performed during the hospital encounter of 11/22/19  CBC with Differential  Result Value Ref Range   WBC 17.0 (H) 4.0 - 10.5 K/uL   RBC 4.29 4.22 - 5.81 MIL/uL   Hemoglobin 14.4 13.0 - 17.0 g/dL   HCT 40.6 39.0 - 52.0 %   MCV 94.6 80.0 - 100.0 fL   MCH 33.6 26.0 - 34.0 pg   MCHC 35.5 30.0 - 36.0 g/dL   RDW 11.4 (L) 11.5 - 15.5 %   Platelets 383 150 - 400 K/uL   nRBC 0.0 0.0 - 0.2 %   Neutrophils Relative % 87 %   Neutro Abs 15.0 (H) 1.7 - 7.7 K/uL   Lymphocytes Relative 7 %   Lymphs Abs 1.1 0.7 - 4.0 K/uL   Monocytes Relative 5 %   Monocytes Absolute 0.8 0.1 - 1.0 K/uL   Eosinophils Relative 0 %   Eosinophils Absolute 0.0 0.0 - 0.5 K/uL   Basophils Relative 0 %   Basophils Absolute 0.0 0.0 - 0.1 K/uL   Immature Granulocytes 1 %   Abs Immature Granulocytes 0.09 (H) 0.00 - 0.07 K/uL  Comprehensive metabolic panel  Result Value Ref Range   Sodium 136 135 - 145 mmol/L   Potassium 3.9 3.5 - 5.1 mmol/L   Chloride 100 98 - 111 mmol/L   CO2 20 (L) 22 - 32 mmol/L   Glucose, Bld 150 (H) 70 - 99 mg/dL   BUN 7 6 - 20 mg/dL   Creatinine, Ser 1.01 0.61 - 1.24 mg/dL   Calcium 9.8 8.9 - 10.3 mg/dL   Total Protein 8.5 (H) 6.5 - 8.1 g/dL   Albumin 4.4 3.5 - 5.0 g/dL   AST 36 15 - 41 U/L   ALT 34 0 - 44 U/L   Alkaline Phosphatase 77 38 - 126 U/L   Total Bilirubin 1.2 0.3 - 1.2 mg/dL    GFR calc non Af Amer >60 >60 mL/min   GFR calc Af Amer >60 >60 mL/min  Anion gap 16 (H) 5 - 15  Lipase, blood  Result Value Ref Range   Lipase 26 11 - 51 U/L  Valproic acid level  Result Value Ref Range   Valproic Acid Lvl 37 (L) 50.0 - 100.0 ug/mL  Levetiracetam level  Result Value Ref Range   Levetiracetam Lvl 44.2 (H) 10.0 - 40.0 ug/mL      Assessment & Plan:  1. Seizures continue depakote onfi and keppra as well as neurontin for such  Fu and mx per [facility] Is seeing speech therapist for such    2.Pain mx will need to fu with pain management   3. Back pain: will need to fu with orthopedic will need ? MRI of back if pain persists. most likely musckulskelteal though adviced streches for back Patient advised to take medication as directed here. Patient advised to rest initially and then slowly increase activity level. Monitor changes in symptoms such as numbness, tingling or weakness in legs, changes in bowel or bladder habits or worsening back pain. Proper ergonomics discussed. Referral to physical therapy as needed. Patient will call if symptoms worsen or if pain persists greater than 8 weeks.   4. Left lower dorsal foot swelling  Will need to elevate, compression stockings as well needed.   Problem List Items Addressed This Visit   None      Follow up plan: No follow-ups on file.  Health Maintenance : Pneumonia vaccine :? To confirm if needs this[ FLu vaccine :2021

## 2021-01-22 LAB — COMPREHENSIVE METABOLIC PANEL
ALT: 13 IU/L (ref 0–44)
AST: 15 IU/L (ref 0–40)
Albumin/Globulin Ratio: 1.2 (ref 1.2–2.2)
Albumin: 3.6 g/dL — ABNORMAL LOW (ref 4.0–5.0)
Alkaline Phosphatase: 71 IU/L (ref 44–121)
BUN/Creatinine Ratio: 16 (ref 9–20)
BUN: 12 mg/dL (ref 6–24)
Bilirubin Total: 0.2 mg/dL (ref 0.0–1.2)
CO2: 20 mmol/L (ref 20–29)
Calcium: 9 mg/dL (ref 8.7–10.2)
Chloride: 102 mmol/L (ref 96–106)
Creatinine, Ser: 0.75 mg/dL — ABNORMAL LOW (ref 0.76–1.27)
Globulin, Total: 3 g/dL (ref 1.5–4.5)
Glucose: 91 mg/dL (ref 65–99)
Potassium: 4.8 mmol/L (ref 3.5–5.2)
Sodium: 139 mmol/L (ref 134–144)
Total Protein: 6.6 g/dL (ref 6.0–8.5)
eGFR: 115 mL/min/{1.73_m2} (ref >59)

## 2021-01-26 ENCOUNTER — Ambulatory Visit: Payer: Medicare Other | Admitting: Internal Medicine

## 2021-02-02 ENCOUNTER — Other Ambulatory Visit: Payer: Self-pay

## 2021-02-02 ENCOUNTER — Other Ambulatory Visit: Payer: Medicare Other

## 2021-02-02 DIAGNOSIS — R569 Unspecified convulsions: Secondary | ICD-10-CM

## 2021-02-02 DIAGNOSIS — Z8042 Family history of malignant neoplasm of prostate: Secondary | ICD-10-CM

## 2021-02-02 DIAGNOSIS — R739 Hyperglycemia, unspecified: Secondary | ICD-10-CM

## 2021-02-02 DIAGNOSIS — Z131 Encounter for screening for diabetes mellitus: Secondary | ICD-10-CM

## 2021-02-02 DIAGNOSIS — Z1329 Encounter for screening for other suspected endocrine disorder: Secondary | ICD-10-CM

## 2021-02-02 DIAGNOSIS — C719 Malignant neoplasm of brain, unspecified: Secondary | ICD-10-CM

## 2021-02-02 DIAGNOSIS — Z125 Encounter for screening for malignant neoplasm of prostate: Secondary | ICD-10-CM

## 2021-02-02 DIAGNOSIS — R32 Unspecified urinary incontinence: Secondary | ICD-10-CM

## 2021-02-03 LAB — COMPREHENSIVE METABOLIC PANEL
ALT: 12 IU/L (ref 0–44)
AST: 16 IU/L (ref 0–40)
Albumin/Globulin Ratio: 1.2 (ref 1.2–2.2)
Albumin: 3.5 g/dL — ABNORMAL LOW (ref 4.0–5.0)
Alkaline Phosphatase: 86 IU/L (ref 44–121)
BUN/Creatinine Ratio: 9 (ref 9–20)
BUN: 7 mg/dL (ref 6–24)
Bilirubin Total: <0.2 mg/dL (ref 0.0–1.2)
CO2: 18 mmol/L — ABNORMAL LOW (ref 20–29)
Calcium: 8.5 mg/dL — ABNORMAL LOW (ref 8.7–10.2)
Chloride: 95 mmol/L — ABNORMAL LOW (ref 96–106)
Creatinine, Ser: 0.75 mg/dL — ABNORMAL LOW (ref 0.76–1.27)
Globulin, Total: 2.9 g/dL (ref 1.5–4.5)
Glucose: 93 mg/dL (ref 65–99)
Potassium: 5 mmol/L (ref 3.5–5.2)
Sodium: 128 mmol/L — ABNORMAL LOW (ref 134–144)
Total Protein: 6.4 g/dL (ref 6.0–8.5)
eGFR: 115 mL/min/{1.73_m2} (ref >59)

## 2021-02-03 LAB — HEMOGLOBIN A1C
Est. average glucose Bld gHb Est-mCnc: 88 mg/dL
Hgb A1c MFr Bld: 4.7 % — ABNORMAL LOW (ref 4.8–5.6)

## 2021-02-03 LAB — CBC WITH DIFFERENTIAL/PLATELET
Basophils Absolute: 0 10*3/uL (ref 0.0–0.2)
Basos: 0 %
EOS (ABSOLUTE): 0.1 10*3/uL (ref 0.0–0.4)
Eos: 1 %
Hematocrit: 37.4 % — ABNORMAL LOW (ref 37.5–51.0)
Hemoglobin: 12.8 g/dL — ABNORMAL LOW (ref 13.0–17.7)
Immature Grans (Abs): 0.1 10*3/uL (ref 0.0–0.1)
Immature Granulocytes: 1 %
Lymphocytes Absolute: 1.7 10*3/uL (ref 0.7–3.1)
Lymphs: 23 %
MCH: 34 pg — ABNORMAL HIGH (ref 26.6–33.0)
MCHC: 34.2 g/dL (ref 31.5–35.7)
MCV: 100 fL — ABNORMAL HIGH (ref 79–97)
Monocytes Absolute: 0.6 10*3/uL (ref 0.1–0.9)
Monocytes: 7 %
Neutrophils Absolute: 5.2 10*3/uL (ref 1.4–7.0)
Neutrophils: 68 %
Platelets: 349 10*3/uL (ref 150–450)
RBC: 3.76 x10E6/uL — ABNORMAL LOW (ref 4.14–5.80)
RDW: 13.2 % (ref 11.6–15.4)
WBC: 7.6 10*3/uL (ref 3.4–10.8)

## 2021-02-03 LAB — PSA: Prostate Specific Ag, Serum: 0.4 ng/mL (ref 0.0–4.0)

## 2021-02-04 ENCOUNTER — Other Ambulatory Visit: Payer: Self-pay | Admitting: Internal Medicine

## 2021-02-04 NOTE — Telephone Encounter (Signed)
Requested medication (s) are due for refill today: historical med   Requested medication (s) are on the active medication list: yes  Last refill:  01/15/21  Future visit scheduled: yes in 1 week   Notes to clinic:  historical med. Do you want to order Rx?     Requested Prescriptions  Pending Prescriptions Disp Refills   Potassium Chloride ER 20 MEQ TBCR [Pharmacy Med Name: POTASSIUM CL ER 20 MEQ TABLET] 120 tablet     Sig: TAKE 2 TABLETS (40 MEQ TOTAL) BY MOUTH 2 (TWO) TIMES DAILY WITH MEALS FOR 30 DAYS      Endocrinology:  Minerals - Potassium Supplementation Failed - 02/04/2021 12:46 PM      Failed - Cr in normal range and within 360 days    Creatinine, Ser  Date Value Ref Range Status  02/02/2021 0.75 (L) 0.76 - 1.27 mg/dL Final          Passed - K in normal range and within 360 days    Potassium  Date Value Ref Range Status  02/02/2021 5.0 3.5 - 5.2 mmol/L Final          Passed - Valid encounter within last 12 months    Recent Outpatient Visits           2 weeks ago History of back surgery   [place] Vigg, Avanti, MD       Future Appointments             In 1 week Vigg, Avanti, MD [facility]

## 2021-02-04 NOTE — Telephone Encounter (Signed)
Routing to provider  

## 2021-02-11 ENCOUNTER — Ambulatory Visit (INDEPENDENT_AMBULATORY_CARE_PROVIDER_SITE_OTHER): Payer: Medicare Other | Admitting: Internal Medicine

## 2021-02-11 ENCOUNTER — Other Ambulatory Visit: Payer: Self-pay

## 2021-02-11 ENCOUNTER — Encounter: Payer: Self-pay | Admitting: Internal Medicine

## 2021-02-11 VITALS — BP 99/68 | HR 80 | Temp 98.0°F | Ht 68.0 in | Wt 119.0 lb

## 2021-02-11 DIAGNOSIS — D649 Anemia, unspecified: Secondary | ICD-10-CM

## 2021-02-11 DIAGNOSIS — G40909 Epilepsy, unspecified, not intractable, without status epilepticus: Secondary | ICD-10-CM

## 2021-02-11 DIAGNOSIS — F419 Anxiety disorder, unspecified: Secondary | ICD-10-CM

## 2021-02-11 DIAGNOSIS — M549 Dorsalgia, unspecified: Secondary | ICD-10-CM | POA: Diagnosis not present

## 2021-02-11 DIAGNOSIS — E871 Hypo-osmolality and hyponatremia: Secondary | ICD-10-CM

## 2021-02-11 DIAGNOSIS — C719 Malignant neoplasm of brain, unspecified: Secondary | ICD-10-CM

## 2021-02-11 NOTE — Progress Notes (Addendum)
BP 99/68   Pulse 80   Temp 98 F (36.7 C) (Oral)   Ht 5' 8" (1.727 m)   Wt 119 lb (54 kg)   SpO2 98%   BMI 18.09 kg/m    Subjective:    Patient ID: Cory Hayes, male    DOB: 1976-01-07, 45 y.o.   MRN: [id number]  HPI: Cory Hayes is a 45 y.o. 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 Pt was seen yesterday at [facility] was recommended by chemotherapy to start asap.  Has had chemo x 1 and radiation x 2015     No chief complaint on file.   Relevant past medical, surgical, family and social history reviewed and updated as indicated. Interim medical history since our last visit reviewed. Allergies and medications reviewed and updated.  Review of Systems  Per HPI unless specifically indicated above     Objective:    BP 99/68   Pulse 80   Temp 98 F (36.7 C) (Oral)   Ht 5' 8" (1.727 m)   Wt 119 lb (54 kg)   SpO2 98%   BMI 18.09 kg/m   Wt Readings from Last 3 Encounters:  02/11/21 119 lb (54 kg)  01/21/21 119 lb (54 kg)  11/22/19 125 lb (56.7 kg)    Physical Exam Vitals and nursing note reviewed.  Constitutional:      General: He is not in acute distress.    Appearance: Normal appearance. He is not ill-appearing or diaphoretic.  HENT:     Head: Normocephalic and atraumatic.     Right Ear: Tympanic membrane and external ear normal. There is no impacted cerumen.     Left Ear: External ear normal.     Nose: No congestion or rhinorrhea.     Mouth/Throat:     Pharynx: No oropharyngeal exudate or posterior oropharyngeal erythema.  Eyes:     Conjunctiva/sclera: Conjunctivae normal.     Pupils: Pupils are equal, round, and reactive to light.  Cardiovascular:     Rate and Rhythm: Normal rate and regular rhythm.     Heart sounds: No murmur heard. No friction rub. No gallop.   Pulmonary:      Effort: No respiratory distress.     Breath sounds: No stridor. No wheezing or rhonchi.  Chest:     Chest wall: No tenderness.  Abdominal:     General: Abdomen is flat. Bowel sounds are normal.     Palpations: Abdomen is soft. There is no mass.     Tenderness: There is no abdominal tenderness.  Musculoskeletal:     Cervical back: Normal range of motion and neck supple. No rigidity or tenderness.     Left lower leg: No edema.  Skin:    General: Skin is warm and dry.  Neurological:     Mental Status: He is alert.     Results for orders placed or performed in visit on 02/02/21  CBC with Differential/Platelet  Result Value Ref Range   WBC 7.6 3.4 - 10.8 x10E3/uL   RBC 3.76 (L) 4.14 - 5.80 x10E6/uL   Hemoglobin 12.8 (L) 13.0 - 17.7 g/dL   Hematocrit 37.4 (L) 37.5 - 51.0 %   MCV 100 (H) 79 - 97 fL   MCH 34.0 (H) 26.6 - 33.0 pg   MCHC 34.2 31.5 - 35.7 g/dL   RDW 13.2  11.6 - 15.4 %   Platelets 349 150 - 450 x10E3/uL   Neutrophils 68 Not Estab. %   Lymphs 23 Not Estab. %   Monocytes 7 Not Estab. %   Eos 1 Not Estab. %   Basos 0 Not Estab. %   Neutrophils Absolute 5.2 1.4 - 7.0 x10E3/uL   Lymphocytes Absolute 1.7 0.7 - 3.1 x10E3/uL   Monocytes Absolute 0.6 0.1 - 0.9 x10E3/uL   EOS (ABSOLUTE) 0.1 0.0 - 0.4 x10E3/uL   Basophils Absolute 0.0 0.0 - 0.2 x10E3/uL   Immature Granulocytes 1 Not Estab. %   Immature Grans (Abs) 0.1 0.0 - 0.1 x10E3/uL  Comprehensive metabolic panel  Result Value Ref Range   Glucose 93 65 - 99 mg/dL   BUN 7 6 - 24 mg/dL   Creatinine, Ser 0.75 (L) 0.76 - 1.27 mg/dL   eGFR 115 >59 mL/min/1.73   BUN/Creatinine Ratio 9 9 - 20   Sodium 128 (L) 134 - 144 mmol/L   Potassium 5.0 3.5 - 5.2 mmol/L   Chloride 95 (L) 96 - 106 mmol/L   CO2 18 (L) 20 - 29 mmol/L   Calcium 8.5 (L) 8.7 - 10.2 mg/dL   Total Protein 6.4 6.0 - 8.5 g/dL   Albumin 3.5 (L) 4.0 - 5.0 g/dL   Globulin, Total 2.9 1.5 - 4.5 g/dL   Albumin/Globulin Ratio 1.2 1.2 - 2.2   Bilirubin Total <0.2  0.0 - 1.2 mg/dL   Alkaline Phosphatase 86 44 - 121 IU/L   AST 16 0 - 40 IU/L   ALT 12 0 - 44 IU/L  Hemoglobin A1c  Result Value Ref Range   Hgb A1c MFr Bld 4.7 (L) 4.8 - 5.6 %   Est. average glucose Bld gHb Est-mCnc 88 mg/dL  PSA  Result Value Ref Range   Prostate Specific Ag, Serum 0.4 0.0 - 4.0 ng/mL        Current Outpatient Medications:    cloBAZam (ONFI) 10 MG tablet, Take 1 tablet by mouth 2 (two) times daily., Disp: , Rfl:    divalproex (DEPAKOTE) 250 MG DR tablet, Take 5 tablets by mouth 2 (two) times daily., Disp: , Rfl:    fludrocortisone (FLORINEF) 0.1 MG tablet, Take 1 tablet by mouth daily., Disp: , Rfl:    gabapentin (NEURONTIN) 100 MG capsule, Take 100 mg by mouth daily., Disp: , Rfl:    levETIRAcetam (KEPPRA) 1000 MG tablet, Take 2,000 mg by mouth 2 (two) times daily., Disp: , Rfl:    magnesium oxide (MAG-OX) 400 MG tablet, Take 1 tablet by mouth 2 (two) times daily., Disp: , Rfl:    melatonin 3 MG TABS tablet, Take 1 tablet by mouth daily as needed., Disp: , Rfl:    Multiple Vitamin (QUINTABS) TABS, Take 1 tablet by mouth daily., Disp: , Rfl:    ondansetron (ZOFRAN) 4 MG tablet, Take 4 mg by mouth every 8 (eight) hours as needed., Disp: , Rfl:    oxyCODONE (OXY IR/ROXICODONE) 5 MG immediate release tablet, Take 5 mg by mouth every 6 (six) hours as needed., Disp: , Rfl:    pantoprazole (PROTONIX) 40 MG tablet, Take 40 mg by mouth daily., Disp: , Rfl:    Potassium Chloride ER 20 MEQ TBCR, TAKE 2 TABLETS (40 MEQ TOTAL) BY MOUTH 2 (TWO) TIMES DAILY WITH MEALS FOR 30 DAYS, Disp: 120 tablet, Rfl: 6    Assessment & Plan:  1l. Seizures  on depakote 1276m BID, keppra 2gm BID and onfi 148mBID. Fu and  mx per neurology  Seizures sec to   2. Back pain  :to wean off of neurontin as this was primarily for his back pain  To see ortho for such  Next week To go to the pain clinic for such    3. Hyponatremia : at 128 was on low sodium diet  Fu and mx per neurologist    4.  Anemia ? Sec to seizure meds  Stable, chronic.  Problem List Items Addressed This Visit   None

## 2021-02-11 NOTE — Patient Instructions (Signed)
Results for Cory Hayes, Cory Hayes (MRN [id number]) as of 02/11/2021 13:39  Ref. Range 02/02/2021 13:10  COMPREHENSIVE METABOLIC PANEL Unknown Rpt (A)  Sodium Latest Ref Range: 134 - 144 mmol/L 128 (L)  Potassium Latest Ref Range: 3.5 - 5.2 mmol/L 5.0  Chloride Latest Ref Range: 96 - 106 mmol/L 95 (L)  CO2 Latest Ref Range: 20 - 29 mmol/L 18 (L)  Glucose Latest Ref Range: 65 - 99 mg/dL 93  BUN Latest Ref Range: 6 - 24 mg/dL 7  Creatinine Latest Ref Range: 0.76 - 1.27 mg/dL 0.75 (L)  Calcium Latest Ref Range: 8.7 - 10.2 mg/dL 8.5 (L)  BUN/Creatinine Ratio Latest Ref Range: 9 - 20  9  EGFR Latest Ref Range: >59 mL/min/1.73 115  Alkaline Phosphatase Latest Ref Range: 44 - 121 IU/L 86  Albumin Latest Ref Range: 4.0 - 5.0 g/dL 3.5 (L)  Albumin/Globulin Ratio Latest Ref Range: 1.2 - 2.2  1.2  AST Latest Ref Range: 0 - 40 IU/L 16  ALT Latest Ref Range: 0 - 44 IU/L 12  Total Protein Latest Ref Range: 6.0 - 8.5 g/dL 6.4  Total Bilirubin Latest Ref Range: 0.0 - 1.2 mg/dL <0.2  Globulin, Total Latest Ref Range: 1.5 - 4.5 g/dL 2.9  WBC Latest Ref Range: 3.4 - 10.8 x10E3/uL 7.6  RBC Latest Ref Range: 4.14 - 5.80 x10E6/uL 3.76 (L)  Hemoglobin Latest Ref Range: 13.0 - 17.7 g/dL 12.8 (L)  HCT Latest Ref Range: 37.5 - 51.0 % 37.4 (L)  MCV Latest Ref Range: 79 - 97 fL 100 (H)  MCH Latest Ref Range: 26.6 - 33.0 pg 34.0 (H)  MCHC Latest Ref Range: 31.5 - 35.7 g/dL 34.2  RDW Latest Ref Range: 11.6 - 15.4 % 13.2  Platelets Latest Ref Range: 150 - 450 x10E3/uL 349  Neutrophils Latest Ref Range: Not Estab. % 68  Immature Granulocytes Latest Ref Range: Not Estab. % 1  NEUT# Latest Ref Range: 1.4 - 7.0 x10E3/uL 5.2  Lymphocyte # Latest Ref Range: 0.7 - 3.1 x10E3/uL 1.7  Monocytes Absolute Latest Ref Range: 0.1 - 0.9 x10E3/uL 0.6  Basophils Absolute Latest Ref Range: 0.0 - 0.2 x10E3/uL 0.0  Immature Grans (Abs) Latest Ref Range: 0.0 - 0.1 x10E3/uL 0.1  Lymphs Latest Ref Range: Not Estab. % 23  Monocytes Latest Ref  Range: Not Estab. % 7  Basos Latest Ref Range: Not Estab. % 0  Eos Latest Ref Range: Not Estab. % 1  EOS (ABSOLUTE) Latest Ref Range: 0.0 - 0.4 x10E3/uL 0.1  Hemoglobin A1C Latest Ref Range: 4.8 - 5.6 % 4.7 (L)  Est. average glucose Bld gHb Est-mCnc Latest Units: mg/dL 88  Prostate Specific Ag, Serum Latest Ref Range: 0.0 - 4.0 ng/mL 0.4

## 2021-02-16 ENCOUNTER — Ambulatory Visit (INDEPENDENT_AMBULATORY_CARE_PROVIDER_SITE_OTHER): Payer: Medicare Other

## 2021-02-16 ENCOUNTER — Ambulatory Visit (INDEPENDENT_AMBULATORY_CARE_PROVIDER_SITE_OTHER): Payer: Medicare Other | Admitting: Orthopaedic Surgery

## 2021-02-16 ENCOUNTER — Other Ambulatory Visit: Payer: Self-pay

## 2021-02-16 ENCOUNTER — Encounter: Payer: Self-pay | Admitting: Orthopaedic Surgery

## 2021-02-16 VITALS — Ht 68.0 in | Wt 122.0 lb

## 2021-02-16 DIAGNOSIS — M546 Pain in thoracic spine: Secondary | ICD-10-CM | POA: Diagnosis not present

## 2021-02-16 NOTE — Progress Notes (Signed)
Office Visit Note   Patient: Cory Hayes           Date of Birth: 10-03-1976           MRN: [id number] Visit Date: 02/16/2021              Requested by: Charlynne Cousins, MD [address] [place],  [place] [address] PCP: Charlynne Cousins, MD   Assessment & Plan: Visit Diagnoses:  1. Pain in thoracic spine     Plan: My impression is that he is deconditioned from his recent brain surgery is that he just needs physical therapy for strengthening of his back muscles.  I cannot see any problems with the hardware.  I did give him a prescription for an AFO for the left foot drop.  Questions encouraged and answered.  Follow-up as needed.  Follow-Up Instructions: Return if symptoms worsen or fail to improve.   Orders:  Orders Placed This Encounter  Procedures  . XR Thoracic Spine 2 View   No orders of the defined types were placed in this encounter.     Procedures: No procedures performed   Clinical Data: No additional findings.   Subjective: Chief Complaint  Patient presents with  . Spine - Pain    [name] is a 45 year old gentleman on Medicare disability who comes in for evaluation of upper back pain for several years.  He has had a very complicated medical and surgical history.  He has had 3 brain surgeries for brain cancer.  He has been in a wheelchair since the third brain surgery.  He is currently getting home health PT.  He has weakness to left ankle dorsiflexion and is causing trouble with ambulation.  His is putting him at risk for falling.  He denies any numbness and tingling in his arms.   Review of Systems  Constitutional: Negative.   All other systems reviewed and are negative.    Objective: Vital Signs: Ht 5\' 8"  (1.727 m)   Wt 122 lb (55.3 kg)   BMI 18.55 kg/m   Physical Exam Vitals and nursing note reviewed.  Constitutional:      Appearance: He is well-developed.  HENT:     Head: Normocephalic and atraumatic.  Eyes:     Pupils: Pupils are equal, round, and  reactive to light.  Pulmonary:     Effort: Pulmonary effort is normal.  Abdominal:     Palpations: Abdomen is soft.  Musculoskeletal:        General: Normal range of motion.     Cervical back: Neck supple.  Skin:    General: Skin is warm.  Neurological:     Mental Status: He is alert and oriented to person, place, and time.  Psychiatric:        Behavior: Behavior normal.        Thought Content: Thought content normal.        Judgment: Judgment normal.     Ortho Exam Examination of the upper back shows a fully healed surgical scar.  The spinal hardware is palpable underneath the skin but there is no skin compromise or impending skin problems.  His motor and sensory examination of the upper extremities and lower extremities are at baseline. Specialty Comments:  No specialty comments available.  Imaging: XR Thoracic Spine 2 View  Result Date: 02/16/2021 Status post T3-T7 posterior spinal fusion with instrumentation.  No acute abnormalities.    PMFS History: Patient Active Problem List   Diagnosis Date Noted  .  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 Epilepsy ([place]) 01/21/2021   Past Medical History:  Diagnosis Date  . 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 Seizures ([place])     Family History  Problem Relation Age of Onset  . Throat cancer Father   . Heart attack Maternal Grandmother   . Ovarian cancer Maternal Grandmother   . Stomach cancer Maternal Grandmother   . Heart disease Maternal Grandmother   . Prostate cancer Maternal Grandfather     Past Surgical History:  Procedure Laterality Date  . BACK SURGERY  2017  . CRANIOTOMY  2008, 2015, 12/21/20   Social History   Occupational History  . Not on file  Tobacco Use  . Smoking status: Former Smoker    Quit date: 2015    Years since quitting: 7.2  . Smokeless tobacco: Never Used  Vaping Use  . Vaping Use: Every day  Substance and Sexual Activity  . Alcohol use: Not Currently    Comment: over 2 years ago  . Drug use: Yes     Types: Marijuana    Comment: smokes daily per patient  . Sexual activity: Not Currently

## 2021-02-18 ENCOUNTER — Telehealth: Payer: Self-pay

## 2021-02-18 NOTE — Telephone Encounter (Signed)
Patients mom called regarding AFO device mom stated she needs a rx for the device to be sent to a medical supply store so she can pick it up call [phone]

## 2021-02-18 NOTE — Telephone Encounter (Signed)
It was AFO to [facility].  thanks

## 2021-02-18 NOTE — Telephone Encounter (Signed)
Please advise? Per last note you gave them Rx. Maybe they misplaced it. Rx for AFO? Counselling psychologist?

## 2021-02-19 ENCOUNTER — Telehealth: Payer: Self-pay | Admitting: Orthopaedic Surgery

## 2021-02-19 NOTE — Telephone Encounter (Signed)
Patient's Becky mother called requesting Kathlee Nations send script for AFO to a medical supply she recommends. Jacqlyn Larsen states the medical supply place does not carry what patient needs. Please call Becky back at [phone].

## 2021-02-19 NOTE — Telephone Encounter (Signed)
Called to let them know Rx ready for pick up. States they already have RX for AFO. Would like to know if she is able to take Rx to [facility] store since its closer to them. Cory Hayes she can and for any reason they can't do an AFO there to call us and we will fax RX to [place].    She will let us know.

## 2021-02-22 NOTE — Telephone Encounter (Signed)
FAXED. THEY WILL CONTACT PATIENT.

## 2021-02-23 ENCOUNTER — Telehealth: Payer: Self-pay | Admitting: Orthopaedic Surgery

## 2021-02-23 NOTE — Telephone Encounter (Signed)
Patient's mother Jacqlyn Larsen called requesting a call back from [place] pertaining to a brace script being sent to [facility]. Please call Becky at [phone].

## 2021-02-24 NOTE — Telephone Encounter (Signed)
Pts mother Cory Hayes called stating she spoke with a [name] at [facility] and she was informed the rx still wasn't faxed over; in addition to the rx [facility] is saying they need a demographics sheet and Dr. Notes where the AFO is discussed. [name] did leave a fax number and Cory Hayes would like [place] to try it and give her a call when we do.  Fax# [phone]

## 2021-02-24 NOTE — Telephone Encounter (Signed)
Cory Hayes Rx for AFO  was faxed to [facility] on the 11th

## 2021-02-25 NOTE — Telephone Encounter (Signed)
Faxed everything again to ATTN: [name]. Faxed Rx through [facility] email and faxed OV Note & demo through [place].

## 2021-03-08 ENCOUNTER — Telehealth: Payer: Self-pay | Admitting: Internal Medicine

## 2021-03-08 NOTE — Telephone Encounter (Signed)
Called to get verbal orders to continue OT re-evaluation for 1xwk 1.  Any questions, please call at [phone]

## 2021-03-09 NOTE — Telephone Encounter (Signed)
[place] and gave ok for verbal orders for OT re-evaluation.

## 2021-03-14 DIAGNOSIS — F419 Anxiety disorder, unspecified: Secondary | ICD-10-CM | POA: Insufficient documentation

## 2021-03-14 NOTE — Progress Notes (Deleted)
Patient did not keep initial appointment.

## 2021-03-15 ENCOUNTER — Ambulatory Visit: Payer: Medicare Other | Admitting: Pain Medicine

## 2021-03-15 ENCOUNTER — Other Ambulatory Visit: Payer: Self-pay | Admitting: Internal Medicine

## 2021-03-15 DIAGNOSIS — G894 Chronic pain syndrome: Secondary | ICD-10-CM | POA: Insufficient documentation

## 2021-03-15 DIAGNOSIS — Z79899 Other long term (current) drug therapy: Secondary | ICD-10-CM | POA: Insufficient documentation

## 2021-03-15 DIAGNOSIS — Z789 Other specified health status: Secondary | ICD-10-CM | POA: Insufficient documentation

## 2021-03-15 DIAGNOSIS — M899 Disorder of bone, unspecified: Secondary | ICD-10-CM | POA: Insufficient documentation

## 2021-03-15 DIAGNOSIS — Z981 Arthrodesis status: Secondary | ICD-10-CM | POA: Insufficient documentation

## 2021-03-15 MED ORDER — PANTOPRAZOLE SODIUM 40 MG PO TBEC: 40.0000 mg | DELAYED_RELEASE_TABLET | Freq: Every day | ORAL | 6 refills | Status: DC

## 2021-03-15 MED ORDER — MAGNESIUM OXIDE 400 MG PO TABS: 1.0000 | ORAL_TABLET | Freq: Two times a day (BID) | ORAL | 6 refills | Status: DC

## 2021-03-15 NOTE — Telephone Encounter (Signed)
Copied from [place] [id number]. Topic: Quick Communication - Rx Refill/Question >> Mar 15, 2021  9:32 AM Leward Quan A wrote: Medication: pantoprazole (PROTONIX) 40 MG tablet, magnesium oxide (MAG-OX) 400 MG tablet   Has the patient contacted their pharmacy? Yes.   (Agent: If no, request that the patient contact the pharmacy for the refill.) (Agent: If yes, when and what did the pharmacy advise?)  Preferred Pharmacy (with phone number or street name): [id number] - [place], [place]  Phone:  [phone] Fax:  [phone]     Agent: Please be advised that RX refills may take up to 3 business days. We ask that you follow-up with your pharmacy.

## 2021-03-15 NOTE — Telephone Encounter (Signed)
Pt last apt on 02/11/2021 next on 03/24/2021

## 2021-03-15 NOTE — Telephone Encounter (Signed)
   Notes to clinic: Medication filled by a historical provider  Review for refills   Requested Prescriptions  Pending Prescriptions Disp Refills   pantoprazole (PROTONIX) 40 MG tablet      Sig: Take 1 tablet (40 mg total) by mouth daily.      Gastroenterology: Proton Pump Inhibitors Passed - 03/15/2021  9:36 AM      Passed - Valid encounter within last 12 months    Recent Outpatient Visits           1 month ago    [facility] Vigg, Avanti, MD   1 month ago History of back surgery   [facility] Vigg, Avanti, MD       Future Appointments             In 1 week Vigg, Avanti, MD [facility]               magnesium oxide (MAG-OX) 400 MG tablet      Sig: Take 1 tablet (400 mg total) by mouth 2 (two) times daily.      There is no refill protocol information for this order

## 2021-03-23 ENCOUNTER — Encounter: Payer: Self-pay | Admitting: Pain Medicine

## 2021-03-24 ENCOUNTER — Telehealth: Payer: Medicare Other | Admitting: Internal Medicine

## 2021-03-30 ENCOUNTER — Ambulatory Visit: Payer: Medicare Other | Admitting: Internal Medicine

## 2021-04-26 ENCOUNTER — Telehealth: Payer: Self-pay

## 2021-04-26 ENCOUNTER — Telehealth: Payer: Self-pay | Admitting: Internal Medicine

## 2021-04-26 ENCOUNTER — Other Ambulatory Visit: Payer: Self-pay

## 2021-04-26 DIAGNOSIS — C719 Malignant neoplasm of brain, unspecified: Secondary | ICD-10-CM

## 2021-04-26 NOTE — Telephone Encounter (Signed)
Please advise 

## 2021-04-26 NOTE — Telephone Encounter (Signed)
Copied from [place] [id number]. Topic: General - Other >> Apr 26, 2021  3:07 PM Yvette Rack wrote: Reason for CRM: Pt mother called and requested to speak with Tammy. Cb# [phone]

## 2021-04-26 NOTE — Telephone Encounter (Signed)
Patein's mother was called, she stated that her son needs to have his platelets checked every 2 weeks for his Chemotherapy at [facility]. CBC with Diff was ordered as a standing order every 2 weeks. Patient was also informed that if the Cancer center required any more lab work completed every two weeks to please have them call us.

## 2021-04-26 NOTE — Telephone Encounter (Signed)
See previous message. Duplicate

## 2021-04-26 NOTE — Telephone Encounter (Signed)
Returned patients call, Patient's mother just wanted to let me know that she was unable to speak with her son's cancer physician about additional lab work. Lab appt scheduled for tomorrow at 940

## 2021-04-26 NOTE — Telephone Encounter (Signed)
Pt's mom called back to speak to Tammy again regarding his labs.  CB#  [phone]

## 2021-04-26 NOTE — Telephone Encounter (Signed)
Patient's mom called to ask that the patient get orders in for lab work which his oncologist suggested.  Patient is a cancer patient and needs to have his platelets watched.  Please call mom, Jacqlyn Larsen, to let her know when patient can come in for lab work.  CB# [phone]

## 2021-04-26 NOTE — Telephone Encounter (Signed)
Izora Gala at [facility] cancer called to request the patients CBC and CMP because they are looking for platlet information.  Please call to discuss at [phone] or fax information to [phone]

## 2021-04-27 ENCOUNTER — Other Ambulatory Visit: Payer: Medicare Other

## 2021-04-27 ENCOUNTER — Other Ambulatory Visit: Payer: Self-pay | Admitting: Internal Medicine

## 2021-04-27 ENCOUNTER — Telehealth: Payer: Self-pay

## 2021-04-27 ENCOUNTER — Other Ambulatory Visit: Payer: Self-pay

## 2021-04-27 DIAGNOSIS — C719 Malignant neoplasm of brain, unspecified: Secondary | ICD-10-CM

## 2021-04-27 NOTE — Telephone Encounter (Signed)
  Notes to clinic: Patient requesting a 90 day script    Requested Prescriptions  Pending Prescriptions Disp Refills   Potassium Chloride ER 20 MEQ TBCR [Pharmacy Med Name: POTASSIUM CL ER 20 MEQ TABLET] 360 tablet 3    Sig: TAKE 2 TABLETS (40 MEQ TOTAL) BY MOUTH 2 (TWO) TIMES DAILY WITH MEALS FOR 30 DAYS      Endocrinology:  Minerals - Potassium Supplementation Failed - 04/27/2021  8:31 AM      Failed - Cr in normal range and within 360 days    Creatinine, Ser  Date Value Ref Range Status  02/02/2021 0.75 (L) 0.76 - 1.27 mg/dL Final          Passed - K in normal range and within 360 days    Potassium  Date Value Ref Range Status  02/02/2021 5.0 3.5 - 5.2 mmol/L Final          Passed - Valid encounter within last 12 months    Recent Outpatient Visits           2 months ago [place], MD   3 months ago History of back surgery   [facility] Vigg, Loman [place], MD

## 2021-04-27 NOTE — Telephone Encounter (Signed)
Called patient and informed him that he is up to date on tetanus vaccination.

## 2021-04-27 NOTE — Telephone Encounter (Signed)
Pt would like lab results sent over to [facility] states that he is on chemo and has to get labs every 2 weeks

## 2021-04-28 LAB — CBC WITH DIFFERENTIAL
Basophils Absolute: 0 10*3/uL (ref 0.0–0.2)
Basos: 1 %
EOS (ABSOLUTE): 0.1 10*3/uL (ref 0.0–0.4)
Eos: 1 %
Hematocrit: 39 % (ref 37.5–51.0)
Hemoglobin: 13.3 g/dL (ref 13.0–17.7)
Immature Grans (Abs): 0 10*3/uL (ref 0.0–0.1)
Immature Granulocytes: 0 %
Lymphocytes Absolute: 2 10*3/uL (ref 0.7–3.1)
Lymphs: 50 %
MCH: 33.8 pg — ABNORMAL HIGH (ref 26.6–33.0)
MCHC: 34.1 g/dL (ref 31.5–35.7)
MCV: 99 fL — ABNORMAL HIGH (ref 79–97)
Monocytes Absolute: 0.4 10*3/uL (ref 0.1–0.9)
Monocytes: 9 %
Neutrophils Absolute: 1.6 10*3/uL (ref 1.4–7.0)
Neutrophils: 39 %
RBC: 3.94 x10E6/uL — ABNORMAL LOW (ref 4.14–5.80)
RDW: 14.3 % (ref 11.6–15.4)
WBC: 4.1 10*3/uL (ref 3.4–10.8)

## 2021-04-29 LAB — SPECIMEN STATUS REPORT

## 2021-04-29 LAB — PLATELET COUNT: Platelets: 224 10*3/uL (ref 150–450)

## 2021-05-11 ENCOUNTER — Other Ambulatory Visit: Payer: Medicare Other

## 2021-05-18 ENCOUNTER — Ambulatory Visit
Admission: RE | Admit: 2021-05-18 | Discharge: 2021-05-18 | Disposition: A | Discharge status: Home / Self Care | Payer: Medicare Other | Source: Ambulatory Visit | Attending: Internal Medicine | Admitting: Internal Medicine

## 2021-05-18 ENCOUNTER — Ambulatory Visit: Payer: Medicare Other | Admitting: Internal Medicine

## 2021-05-18 ENCOUNTER — Other Ambulatory Visit: Payer: Self-pay

## 2021-05-18 ENCOUNTER — Other Ambulatory Visit: Payer: Self-pay | Admitting: Internal Medicine

## 2021-05-18 ENCOUNTER — Telehealth: Payer: Medicare Other | Admitting: Internal Medicine

## 2021-05-18 ENCOUNTER — Telehealth: Payer: Self-pay

## 2021-05-18 ENCOUNTER — Ambulatory Visit
Admission: RE | Admit: 2021-05-18 | Discharge: 2021-05-18 | Disposition: A | Discharge status: Home / Self Care | Payer: Medicare Other | Attending: Internal Medicine | Admitting: Internal Medicine

## 2021-05-18 DIAGNOSIS — W19XXXA Unspecified fall, initial encounter: Secondary | ICD-10-CM | POA: Insufficient documentation

## 2021-05-18 DIAGNOSIS — C719 Malignant neoplasm of brain, unspecified: Secondary | ICD-10-CM

## 2021-05-18 DIAGNOSIS — M7989 Other specified soft tissue disorders: Secondary | ICD-10-CM | POA: Insufficient documentation

## 2021-05-18 NOTE — Telephone Encounter (Signed)
Pt Mother Miko Sirico is calling to request labs to be taken every 2 weeks since he is a cancer pt at [facility] note states no outside labs from another provider. Please advise  CB- [phone]

## 2021-05-18 NOTE — Telephone Encounter (Signed)
Chloe notified that we did get the care plan.  In providers folder for signature.

## 2021-05-18 NOTE — Telephone Encounter (Signed)
Copied from [place] [id number]. Topic: General - Other >> May 18, 2021  8:35 AM Rayann Heman wrote: Reason for CRM: Chloe calling with [facility] called and would like to know if we received care plan via fax

## 2021-05-18 NOTE — Telephone Encounter (Signed)
Please advise 

## 2021-05-18 NOTE — Telephone Encounter (Signed)
I thought it was a standing order

## 2021-05-18 NOTE — Telephone Encounter (Signed)
Spoke with patient mother informed her that the orders are standing and all she needs to do is make an appt.

## 2021-05-19 ENCOUNTER — Encounter: Payer: Self-pay | Admitting: Internal Medicine

## 2021-05-19 ENCOUNTER — Telehealth (INDEPENDENT_AMBULATORY_CARE_PROVIDER_SITE_OTHER): Payer: Medicare Other | Admitting: Internal Medicine

## 2021-05-19 DIAGNOSIS — M79642 Pain in left hand: Secondary | ICD-10-CM

## 2021-05-19 NOTE — Progress Notes (Addendum)
There were no vitals taken for this visit.   Subjective:    Patient ID: Cory Hayes, male    DOB: 1976/04/10, 45 y.o.   MRN: [id number]  Chief Complaint  Patient presents with   [place] on Saturday, and hurt his left wrist,hand.   swollen hand    HPI: Cory Hayes is a 45 y.o. male   This visit was completed via video visit through MyChart due to the restrictions of the COVID-19 pandemic. All issues as above were discussed and addressed. Physical exam was done as above through visual confirmation on video through MyChart. If it was felt that the patient should be evaluated in the office, they were directed there. The patient verbally consented to this visit. Location of the patient: home Location of the provider: work Those involved with this call:  Provider: Charlynne Cousins, MD CMA: Frazier Butt, [place] Desk/Registration: Jill Side  Time spent on call: 10 minutes with patient face to face via video conference. More than 50% of this time was spent in counseling and coordination of care. 10 minutes total spent in review of patient's record and preparation of their chart.    Fall Pertinent negatives include no numbness or tingling.  Hand Pain  The incident occurred 5 to 7 days ago. The pain is at a severity of 2/10. The pain is mild. The pain has been Fluctuating since the incident. Pertinent negatives include no chest pain, muscle weakness, numbness or tingling. The treatment provided moderate relief.  Swelling is looking a whole lot better, obtained results of Xray   Chief Complaint  Patient presents with   [place] on Saturday, and hurt his left wrist,hand.   swollen hand    Relevant past medical, surgical, family and social history reviewed and updated as indicated. Interim medical history since our last visit reviewed. Allergies and medications reviewed and updated.  Review of Systems  Cardiovascular:  Negative for chest pain.  Neurological:   Negative for tingling and numbness.   Per HPI unless specifically indicated above     Objective:    There were no vitals taken for this visit.  Wt Readings from Last 3 Encounters:  02/16/21 122 lb (55.3 kg)  02/11/21 119 lb (54 kg)  01/21/21 119 lb (54 kg)    Physical Exam Constitutional:      Appearance: Normal appearance. He is normal weight.  Skin:    Findings: Lesion present.     Comments: As a laceration sec to fall, skin exposed on the palm of the hand.no d/c noted, erythemaotous +  Neurological:     Mental Status: He is alert.   Results for orders placed or performed in visit on 04/27/21  CBC With Differential  Result Value Ref Range   WBC 4.1 3.4 - 10.8 x10E3/uL   RBC 3.94 (L) 4.14 - 5.80 x10E6/uL   Hemoglobin 13.3 13.0 - 17.7 g/dL   Hematocrit 39.0 37.5 - 51.0 %   MCV 99 (H) 79 - 97 fL   MCH 33.8 (H) 26.6 - 33.0 pg   MCHC 34.1 31.5 - 35.7 g/dL   RDW 14.3 11.6 - 15.4 %   Neutrophils 39 Not Estab. %   Lymphs 50 Not Estab. %   Monocytes 9 Not Estab. %   Eos 1 Not Estab. %   Basos 1 Not Estab. %   Neutrophils Absolute 1.6 1.4 - 7.0 x10E3/uL   Lymphocytes Absolute 2.0 0.7 - 3.1 x10E3/uL  Monocytes Absolute 0.4 0.1 - 0.9 x10E3/uL   EOS (ABSOLUTE) 0.1 0.0 - 0.4 x10E3/uL   Basophils Absolute 0.0 0.0 - 0.2 x10E3/uL   Immature Granulocytes 0 Not Estab. %   Immature Grans (Abs) 0.0 0.0 - 0.1 x10E3/uL  Platelet count  Result Value Ref Range   Platelets 224 150 - 450 x10E3/uL  Specimen status report  Result Value Ref Range   specimen status report Comment         Current Outpatient Medications:    cloBAZam (ONFI) 10 MG tablet, Take 1 tablet by mouth 2 (two) times daily., Disp: , Rfl:    divalproex (DEPAKOTE) 250 MG DR tablet, Take 5 tablets by mouth 2 (two) times daily., Disp: , Rfl:    fludrocortisone (FLORINEF) 0.1 MG tablet, Take 1 tablet by mouth daily., Disp: , Rfl:    gabapentin (NEURONTIN) 100 MG capsule, Take 100 mg by mouth daily., Disp: , Rfl:     levETIRAcetam (KEPPRA) 1000 MG tablet, Take 2,000 mg by mouth 2 (two) times daily., Disp: , Rfl:    magnesium oxide (MAG-OX) 400 MG tablet, Take 1 tablet (400 mg total) by mouth 2 (two) times daily., Disp: 30 tablet, Rfl: 6   melatonin 3 MG TABS tablet, Take 1 tablet by mouth daily as needed., Disp: , Rfl:    Multiple Vitamin (QUINTABS) TABS, Take 1 tablet by mouth daily., Disp: , Rfl:    ondansetron (ZOFRAN) 4 MG tablet, Take 4 mg by mouth every 8 (eight) hours as needed., Disp: , Rfl:    oxyCODONE (OXY IR/ROXICODONE) 5 MG immediate release tablet, Take 5 mg by mouth every 6 (six) hours as needed., Disp: , Rfl:    pantoprazole (PROTONIX) 40 MG tablet, Take 1 tablet (40 mg total) by mouth daily., Disp: 30 tablet, Rfl: 6   Potassium Chloride ER 20 MEQ TBCR, TAKE 2 TABLETS (40 MEQ TOTAL) BY MOUTH 2 (TWO) TIMES DAILY WITH MEALS FOR 30 DAYS, Disp: 180 tablet, Rfl: 3    Assessment & Plan:  Left hand/wrist swelling along with laceration to the left hand patient will need a Tdap vaccine tomorrow. Follow-up with me on the above. No x-ray evidence of fractures.  Probably a sprain per patient edema is getting better. Continue the RICE protocol patient verbalized understanding of the above will be here tomorrow.  Will need dressing of the above wound and receive vaccine then He says he does not Feel like coming into the office today secondary to his chronic medical problems.   Problem List Items Addressed This Visit   None    No orders of the defined types were placed in this encounter.    No orders of the defined types were placed in this encounter.    Follow up plan: No follow-ups on file. \

## 2021-05-19 NOTE — Progress Notes (Signed)
Pt informed of the above results, No fractures noted, RICE protocol initiated and pt verblaized understanding of such.  To fu with me reg this in office.  Adyen Bifulco

## 2021-05-20 ENCOUNTER — Other Ambulatory Visit: Payer: Self-pay

## 2021-05-20 ENCOUNTER — Ambulatory Visit: Payer: Medicare Other | Admitting: Internal Medicine

## 2021-05-20 ENCOUNTER — Ambulatory Visit (INDEPENDENT_AMBULATORY_CARE_PROVIDER_SITE_OTHER): Payer: Medicare Other | Admitting: Internal Medicine

## 2021-05-20 VITALS — BP 121/88 | HR 82 | Temp 98.9°F | Ht 67.99 in | Wt 133.0 lb

## 2021-05-20 DIAGNOSIS — Z23 Encounter for immunization: Secondary | ICD-10-CM

## 2021-05-20 DIAGNOSIS — T148XXA Other injury of unspecified body region, initial encounter: Secondary | ICD-10-CM

## 2021-05-20 NOTE — Progress Notes (Signed)
Patient came in today for a nurse visit to dress wound on left hand and to receive his Tdap vaccination per Dr. Neomia Dear.  Hand was washed with sterile water, dressed with antibacterial ointment, Telfa pad and guaze then placed back in wrist splint.

## 2021-05-22 ENCOUNTER — Other Ambulatory Visit: Payer: Self-pay | Admitting: Internal Medicine

## 2021-05-22 NOTE — Telephone Encounter (Signed)
Pt is requesting 90 day refills Last RF 04/27/21 #180 3 RF. Requested Prescriptions  Pending Prescriptions Disp Refills   Potassium Chloride ER 20 MEQ TBCR [Pharmacy Med Name: POTASSIUM CL ER 20 MEQ TABLET] 540 tablet 2    Sig: TAKE 2 TABLETS (40 MEQ TOTAL) BY MOUTH 2 (TWO) TIMES DAILY WITH MEALS FOR 30 DAYS      Endocrinology:  Minerals - Potassium Supplementation Failed - 05/22/2021  8:34 AM      Failed - Cr in normal range and within 360 days    Creatinine, Ser  Date Value Ref Range Status  02/02/2021 0.75 (L) 0.76 - 1.27 mg/dL Final          Passed - K in normal range and within 360 days    Potassium  Date Value Ref Range Status  02/02/2021 5.0 3.5 - 5.2 mmol/L Final          Passed - Valid encounter within last 12 months    Recent Outpatient Visits           2 days ago Wound discharge   [place] [facility], MD   3 days ago Pain of left hand   [facility] Vigg, Avanti, MD   3 months ago Anxiety   [facility] Vigg, Avanti, MD   4 months ago History of back surgery   [facility] Vigg, Loman [place], MD

## 2021-05-24 ENCOUNTER — Telehealth: Payer: Self-pay

## 2021-05-24 NOTE — Telephone Encounter (Signed)
CRM # [id number] Owner: None Status: Unresolved Priority: Routine Created on: 05/24/2021 12:37 PM By: Yvette Rack    Primary Information  Source Subject Topic  Cory Hayes, Cory Hayes (Patient) Cory Hayes, Cory Hayes (Patient) General - Other  Reason for CRM: Pt mother requests that Dr. Neomia Dear return the call to the pt. Pt mother stated pt had physical therapy and they were told to contact pcp to discuss a recent fall. Pt alsos stated pt has been going through chemo therapy and just would like Dr. Neomia Dear to return the call. Cb# [phone]

## 2021-05-24 NOTE — Telephone Encounter (Signed)
Copied from [place] [id number]. Topic: General - Other >> May 24, 2021 12:33 PM Tessa Lerner A wrote: Reason for CRM: Chloe with [facility] called to share that patient fell over the weekend and hit their head  Patient did not lose consciousness but has a minor abrasion on the top of their head  Patient is receiving chemo treatments and currently experiences nausea as a result of them   Chloe would like to know what patient's PCP recommends as far as following up post fall  Please contact patient to advise further

## 2021-05-24 NOTE — Telephone Encounter (Signed)
Lvm to make this apt with Dr.Vigg

## 2021-05-24 NOTE — Telephone Encounter (Signed)
Pt was seen 7/7 not showing f/u plan

## 2021-05-25 ENCOUNTER — Other Ambulatory Visit: Payer: Self-pay

## 2021-05-25 ENCOUNTER — Ambulatory Visit (INDEPENDENT_AMBULATORY_CARE_PROVIDER_SITE_OTHER): Payer: Medicare Other | Admitting: Internal Medicine

## 2021-05-25 ENCOUNTER — Encounter: Payer: Self-pay | Admitting: Internal Medicine

## 2021-05-25 VITALS — BP 131/86 | HR 78 | Temp 98.8°F | Ht 67.99 in | Wt 133.0 lb

## 2021-05-25 DIAGNOSIS — C719 Malignant neoplasm of brain, unspecified: Secondary | ICD-10-CM

## 2021-05-25 DIAGNOSIS — W19XXXD Unspecified fall, subsequent encounter: Secondary | ICD-10-CM | POA: Diagnosis not present

## 2021-05-25 NOTE — Progress Notes (Signed)
BP 131/86   Pulse 78   Temp 98.8 F (37.1 C) (Oral)   Ht 5' 7.99" (1.727 m)   Wt 133 lb (60.3 kg)   SpO2 98%   BMI 20.23 kg/m    Subjective:    Patient ID: Cory Hayes, male    DOB: 05-Nov-1976, 45 y.o.   MRN: [id number]  Chief Complaint  Patient presents with   Abrasion    On left top of head, happened on Saturday     HPI: Cory Hayes is a 45 y.o. male  Pt is here for a fall, was trying to pee in a bucket which was on the wheelchair  x this was  2 am and mom stayed up and son stayed up with pt.  No LOC and no seizures, took 3rd dose of chemo over the weekend dose upped now. Has these every 6 weeks to see me soon - to have total of 4 doses.    Chief Complaint  Patient presents with   Abrasion    On left top of head, happened on Saturday     Relevant past medical, surgical, family and social history reviewed and updated as indicated. Interim medical history since our last visit reviewed. Allergies and medications reviewed and updated.  Review of Systems  Per HPI unless specifically indicated above     Objective:    BP 131/86   Pulse 78   Temp 98.8 F (37.1 C) (Oral)   Ht 5' 7.99" (1.727 m)   Wt 133 lb (60.3 kg)   SpO2 98%   BMI 20.23 kg/m   Wt Readings from Last 3 Encounters:  05/25/21 133 lb (60.3 kg)  05/20/21 133 lb (60.3 kg)  02/16/21 122 lb (55.3 kg)    Physical Exam Constitutional:      Appearance: Normal appearance.  Musculoskeletal:        General: No swelling or deformity.  Skin:    Findings: Lesion present. No rash.     Comments: Area of superficial abrasion noted on the left hand.    Neurological:     Mental Status: He is alert.   Results for orders placed or performed in visit on 04/27/21  CBC With Differential  Result Value Ref Range   WBC 4.1 3.4 - 10.8 x10E3/uL   RBC 3.94 (L) 4.14 - 5.80 x10E6/uL   Hemoglobin 13.3 13.0 - 17.7 g/dL   Hematocrit 39.0 37.5 - 51.0 %   MCV 99 (H) 79 - 97 fL   MCH 33.8 (H) 26.6 - 33.0 pg    MCHC 34.1 31.5 - 35.7 g/dL   RDW 14.3 11.6 - 15.4 %   Neutrophils 39 Not Estab. %   Lymphs 50 Not Estab. %   Monocytes 9 Not Estab. %   Eos 1 Not Estab. %   Basos 1 Not Estab. %   Neutrophils Absolute 1.6 1.4 - 7.0 x10E3/uL   Lymphocytes Absolute 2.0 0.7 - 3.1 x10E3/uL   Monocytes Absolute 0.4 0.1 - 0.9 x10E3/uL   EOS (ABSOLUTE) 0.1 0.0 - 0.4 x10E3/uL   Basophils Absolute 0.0 0.0 - 0.2 x10E3/uL   Immature Granulocytes 0 Not Estab. %   Immature Grans (Abs) 0.0 0.0 - 0.1 x10E3/uL  Platelet count  Result Value Ref Range   Platelets 224 150 - 450 x10E3/uL  Specimen status report  Result Value Ref Range   specimen status report Comment         Current Outpatient Medications:    cloBAZam (ONFI) 10 MG  tablet, Take 1 tablet by mouth 2 (two) times daily., Disp: , Rfl:    cyanocobalamin 1000 MCG tablet, Take by mouth., Disp: , Rfl:    divalproex (DEPAKOTE) 250 MG DR tablet, Take 5 tablets by mouth 2 (two) times daily., Disp: , Rfl:    fludrocortisone (FLORINEF) 0.1 MG tablet, Take 1 tablet by mouth daily., Disp: , Rfl:    gabapentin (NEURONTIN) 100 MG capsule, Take 100 mg by mouth daily., Disp: , Rfl:    GLEOSTINE 100 MG capsule, Take by mouth., Disp: , Rfl:    GLEOSTINE 40 MG capsule, Take by mouth., Disp: , Rfl:    levETIRAcetam (KEPPRA) 1000 MG tablet, Take 2,000 mg by mouth 2 (two) times daily., Disp: , Rfl:    lomustine (CEENU) 40 MG capsule, Take 4 capsules (total 160 mg) by mouth at night on day 1 of each cycle of chemotherapy (1 dose per 6 week cycle)., Disp: , Rfl:    magnesium oxide (MAG-OX) 400 MG tablet, Take 1 tablet (400 mg total) by mouth 2 (two) times daily., Disp: 30 tablet, Rfl: 6   melatonin 3 MG TABS tablet, Take 1 tablet by mouth daily as needed., Disp: , Rfl:    Multiple Vitamin (QUINTABS) TABS, Take 1 tablet by mouth daily., Disp: , Rfl:    ondansetron (ZOFRAN) 4 MG tablet, Take 4 mg by mouth every 8 (eight) hours as needed., Disp: , Rfl:    oxyCODONE (OXY  IR/ROXICODONE) 5 MG immediate release tablet, Take 5 mg by mouth every 6 (six) hours as needed., Disp: , Rfl:    pantoprazole (PROTONIX) 40 MG tablet, Take 1 tablet (40 mg total) by mouth daily., Disp: 30 tablet, Rfl: 6   Potassium Chloride ER 20 MEQ TBCR, TAKE 2 TABLETS (40 MEQ TOTAL) BY MOUTH 2 (TWO) TIMES DAILY WITH MEALS FOR 30 DAYS, Disp: 540 tablet, Rfl: 2   sodium chloride 1 g tablet, Take 1 g by mouth daily., Disp: , Rfl:     Assessment & Plan:  Fall and abrasion on the right  Had walked through the room with her OT / PT person.  Scrape on the left hand  Healing, will need to keep area dry and to keep it aerated.  No signs of infection noted.   Problem List Items Addressed This Visit   None    No orders of the defined types were placed in this encounter.    No orders of the defined types were placed in this encounter.    Follow up plan: No follow-ups on file.

## 2021-05-26 NOTE — Telephone Encounter (Signed)
Pt came in for apt on 05/25/2021 ans spoke to Dr.Vigg

## 2021-06-07 ENCOUNTER — Other Ambulatory Visit: Payer: Self-pay

## 2021-06-07 ENCOUNTER — Other Ambulatory Visit: Payer: Medicare Other

## 2021-06-07 DIAGNOSIS — C719 Malignant neoplasm of brain, unspecified: Secondary | ICD-10-CM

## 2021-06-07 DIAGNOSIS — Z79899 Other long term (current) drug therapy: Secondary | ICD-10-CM

## 2021-06-08 LAB — COMP. METABOLIC PANEL (12)
AST: 15 IU/L (ref 0–40)
Albumin/Globulin Ratio: 1.7 (ref 1.2–2.2)
Albumin: 3.9 g/dL — ABNORMAL LOW (ref 4.0–5.0)
Alkaline Phosphatase: 70 IU/L (ref 44–121)
BUN/Creatinine Ratio: 7 — ABNORMAL LOW (ref 9–20)
BUN: 6 mg/dL (ref 6–24)
Bilirubin Total: 0.5 mg/dL (ref 0.0–1.2)
Calcium: 8.8 mg/dL (ref 8.7–10.2)
Chloride: 94 mmol/L — ABNORMAL LOW (ref 96–106)
Creatinine, Ser: 0.83 mg/dL (ref 0.76–1.27)
Globulin, Total: 2.3 g/dL (ref 1.5–4.5)
Glucose: 91 mg/dL (ref 65–99)
Potassium: 4.4 mmol/L (ref 3.5–5.2)
Sodium: 130 mmol/L — ABNORMAL LOW (ref 134–144)
Total Protein: 6.2 g/dL (ref 6.0–8.5)
eGFR: 111 mL/min/{1.73_m2} (ref >59)

## 2021-06-08 LAB — CBC WITH DIFFERENTIAL/PLATELET
Basophils Absolute: 0 10*3/uL (ref 0.0–0.2)
Basos: 0 %
EOS (ABSOLUTE): 0.1 10*3/uL (ref 0.0–0.4)
Eos: 2 %
Hematocrit: 37.3 % — ABNORMAL LOW (ref 37.5–51.0)
Hemoglobin: 13 g/dL (ref 13.0–17.7)
Immature Grans (Abs): 0 10*3/uL (ref 0.0–0.1)
Immature Granulocytes: 0 %
Lymphocytes Absolute: 1.5 10*3/uL (ref 0.7–3.1)
Lymphs: 27 %
MCH: 34.9 pg — ABNORMAL HIGH (ref 26.6–33.0)
MCHC: 34.9 g/dL (ref 31.5–35.7)
MCV: 100 fL — ABNORMAL HIGH (ref 79–97)
Monocytes Absolute: 0.6 10*3/uL (ref 0.1–0.9)
Monocytes: 11 %
Neutrophils Absolute: 3.3 10*3/uL (ref 1.4–7.0)
Neutrophils: 60 %
Platelets: 179 10*3/uL (ref 150–450)
RBC: 3.73 x10E6/uL — ABNORMAL LOW (ref 4.14–5.80)
RDW: 14.6 % (ref 11.6–15.4)
WBC: 5.5 10*3/uL (ref 3.4–10.8)

## 2021-06-22 ENCOUNTER — Other Ambulatory Visit: Payer: Medicare Other

## 2021-06-22 ENCOUNTER — Other Ambulatory Visit: Payer: Self-pay

## 2021-06-22 DIAGNOSIS — C719 Malignant neoplasm of brain, unspecified: Secondary | ICD-10-CM

## 2021-06-23 LAB — CBC WITH DIFFERENTIAL
Basophils Absolute: 0 10*3/uL (ref 0.0–0.2)
Basos: 0 %
EOS (ABSOLUTE): 0.1 10*3/uL (ref 0.0–0.4)
Eos: 3 %
Hematocrit: 38.1 % (ref 37.5–51.0)
Hemoglobin: 13.2 g/dL (ref 13.0–17.7)
Immature Grans (Abs): 0 10*3/uL (ref 0.0–0.1)
Immature Granulocytes: 0 %
Lymphocytes Absolute: 1.9 10*3/uL (ref 0.7–3.1)
Lymphs: 38 %
MCH: 35.4 pg — ABNORMAL HIGH (ref 26.6–33.0)
MCHC: 34.6 g/dL (ref 31.5–35.7)
MCV: 102 fL — ABNORMAL HIGH (ref 79–97)
Monocytes Absolute: 0.5 10*3/uL (ref 0.1–0.9)
Monocytes: 11 %
Neutrophils Absolute: 2.4 10*3/uL (ref 1.4–7.0)
Neutrophils: 48 %
RBC: 3.73 x10E6/uL — ABNORMAL LOW (ref 4.14–5.80)
RDW: 14.5 % (ref 11.6–15.4)
WBC: 5 10*3/uL (ref 3.4–10.8)

## 2021-06-25 ENCOUNTER — Telehealth: Payer: Self-pay | Admitting: Internal Medicine

## 2021-06-25 ENCOUNTER — Telehealth: Payer: Self-pay

## 2021-06-25 ENCOUNTER — Ambulatory Visit: Payer: Medicare Other

## 2021-06-25 NOTE — Telephone Encounter (Signed)
Left message with Chloe from PT informing her that Dr.Vigg is OK for patient to proceed with Physical Therapy for duration requested. Advised Chloe to give our office a call back if she has any questions or concerns regarding the patient.

## 2021-06-25 NOTE — Telephone Encounter (Signed)
Routing to provider for verbal orders 

## 2021-06-25 NOTE — Telephone Encounter (Signed)
This nurse called patient in regards to scheduled telephonic AWV. Patient states that this is not a good time due to having family in town. We rescheduled for 07/05/2021.

## 2021-06-25 NOTE — Telephone Encounter (Addendum)
Chloe PT will refax the plan of care and would like a verbal ok in meantime  the plan of care 2x8 for physical therapy  then 1x1 started on 06-14-2021

## 2021-06-25 NOTE — Telephone Encounter (Signed)
Ok to proceed with PT as requested thnx.

## 2021-06-27 ENCOUNTER — Other Ambulatory Visit: Payer: Self-pay | Admitting: Internal Medicine

## 2021-06-27 NOTE — Telephone Encounter (Signed)
Requested medication (s) are due for refill today: yes  Requested medication (s) are on the active medication list: yes  Last refill:  03/15/21 #30 6 RF  Future visit scheduled: no  Notes to clinic:  needs lab work (Magnesium)   Requested Prescriptions  Pending Prescriptions Disp Refills   magnesium oxide (MAG-OX) 400 MG tablet [Pharmacy Med Name: MAGNESIUM OXIDE 400 MG TABLET] 30 tablet 6    Sig: TAKE 1 TABLET BY MOUTH TWICE A DAY     Endocrinology:  [facility] - Magnesium Supplementation Failed - 06/27/2021 10:17 AM      Failed - Mg Level in normal range and within 360 days    No results found for: MG        Passed - Valid encounter within last 12 months    Recent Outpatient Visits           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

## 2021-06-28 ENCOUNTER — Other Ambulatory Visit: Payer: Medicare Other

## 2021-06-28 ENCOUNTER — Other Ambulatory Visit: Payer: Self-pay

## 2021-06-28 DIAGNOSIS — E871 Hypo-osmolality and hyponatremia: Secondary | ICD-10-CM

## 2021-06-28 DIAGNOSIS — G894 Chronic pain syndrome: Secondary | ICD-10-CM

## 2021-06-28 DIAGNOSIS — G40909 Epilepsy, unspecified, not intractable, without status epilepticus: Secondary | ICD-10-CM

## 2021-06-28 DIAGNOSIS — C719 Malignant neoplasm of brain, unspecified: Secondary | ICD-10-CM

## 2021-06-28 DIAGNOSIS — W19XXXA Unspecified fall, initial encounter: Secondary | ICD-10-CM

## 2021-06-28 NOTE — Telephone Encounter (Signed)
Patient last seen 05/25/21

## 2021-06-29 ENCOUNTER — Other Ambulatory Visit: Payer: Self-pay | Admitting: Family Medicine

## 2021-06-29 DIAGNOSIS — C719 Malignant neoplasm of brain, unspecified: Secondary | ICD-10-CM

## 2021-06-29 LAB — CBC WITH DIFFERENTIAL/PLATELET
Basophils Absolute: 0 10*3/uL (ref 0.0–0.2)
Basos: 0 %
EOS (ABSOLUTE): 0.3 10*3/uL (ref 0.0–0.4)
Eos: 6 %
Hematocrit: 38.4 % (ref 37.5–51.0)
Hemoglobin: 13.3 g/dL (ref 13.0–17.7)
Immature Grans (Abs): 0 10*3/uL (ref 0.0–0.1)
Immature Granulocytes: 1 %
Lymphocytes Absolute: 1.4 10*3/uL (ref 0.7–3.1)
Lymphs: 33 %
MCH: 35.4 pg — ABNORMAL HIGH (ref 26.6–33.0)
MCHC: 34.6 g/dL (ref 31.5–35.7)
MCV: 102 fL — ABNORMAL HIGH (ref 79–97)
Monocytes Absolute: 0.4 10*3/uL (ref 0.1–0.9)
Monocytes: 8 %
Neutrophils Absolute: 2.2 10*3/uL (ref 1.4–7.0)
Neutrophils: 52 %
Platelets: 129 10*3/uL — ABNORMAL LOW (ref 150–450)
RBC: 3.76 x10E6/uL — ABNORMAL LOW (ref 4.14–5.80)
RDW: 14.6 % (ref 11.6–15.4)
WBC: 4.2 10*3/uL (ref 3.4–10.8)

## 2021-06-29 LAB — CMP14+EGFR
ALT: 16 IU/L (ref 0–44)
AST: 16 IU/L (ref 0–40)
Albumin/Globulin Ratio: 1.5 (ref 1.2–2.2)
Albumin: 3.7 g/dL — ABNORMAL LOW (ref 4.0–5.0)
Alkaline Phosphatase: 73 IU/L (ref 44–121)
BUN/Creatinine Ratio: 12 (ref 9–20)
BUN: 11 mg/dL (ref 6–24)
Bilirubin Total: 0.4 mg/dL (ref 0.0–1.2)
CO2: 23 mmol/L (ref 20–29)
Calcium: 8.8 mg/dL (ref 8.7–10.2)
Chloride: 99 mmol/L (ref 96–106)
Creatinine, Ser: 0.9 mg/dL (ref 0.76–1.27)
Globulin, Total: 2.4 g/dL (ref 1.5–4.5)
Glucose: 83 mg/dL (ref 65–99)
Potassium: 4.7 mmol/L (ref 3.5–5.2)
Sodium: 132 mmol/L — ABNORMAL LOW (ref 134–144)
Total Protein: 6.1 g/dL (ref 6.0–8.5)
eGFR: 108 mL/min/{1.73_m2} (ref >59)

## 2021-06-29 NOTE — Progress Notes (Signed)
Patient was made aware of lab results and has appt to come in on 8/30 at 10:20 to recheck labs

## 2021-07-05 ENCOUNTER — Ambulatory Visit (INDEPENDENT_AMBULATORY_CARE_PROVIDER_SITE_OTHER): Payer: Medicare Other

## 2021-07-05 VITALS — Ht 68.0 in | Wt 130.0 lb

## 2021-07-05 DIAGNOSIS — Z0000 Encounter for general adult medical examination without abnormal findings: Secondary | ICD-10-CM

## 2021-07-05 NOTE — Patient Instructions (Signed)
[name] , Thank you for taking time to come for your Medicare Wellness Visit. I appreciate your ongoing commitment to your health goals. Please review the following plan we discussed and let me know if I can assist you in the future.   Screening recommendations/referrals: Colonoscopy: n/a Recommended yearly ophthalmology/optometry visit for glaucoma screening and checkup Recommended yearly dental visit for hygiene and checkup  Vaccinations: Influenza vaccine: due Pneumococcal vaccine: decline Tdap vaccine: completed 05/20/2021, due 05/21/2031 Shingles vaccine: n/a   Covid-19: 09/01/2020, 03/05/2020, 02/13/2020  Advanced directives: Please bring a copy of your POA (Power of Attorney) and/or Living Will to your next appointment.   Conditions/risks identified: none  Next appointment: Follow up in one year for your annual wellness visit   Preventive Care 40-64 Years, Male Preventive care refers to lifestyle choices and visits with your health care provider that can promote health and wellness. What does preventive care include? A yearly physical exam. This is also called an annual well check. Dental exams once or twice a year. Routine eye exams. Ask your health care provider how often you should have your eyes checked. Personal lifestyle choices, including: Daily care of your teeth and gums. Regular physical activity. Eating a healthy diet. Avoiding tobacco and drug use. Limiting alcohol use. Practicing safe sex. Taking low-dose aspirin every day starting at age 45. What happens during an annual well check? The services and screenings done by your health care provider during your annual well check will depend on your age, overall health, lifestyle risk factors, and family history of disease. Counseling  Your health care provider may ask you questions about your: Alcohol use. Tobacco use. Drug use. Emotional well-being. Home and relationship well-being. Sexual activity. Eating  habits. Work and work Statistician. Screening  You may have the following tests or measurements: Height, weight, and BMI. Blood pressure. Lipid and cholesterol levels. These may be checked every 5 years, or more frequently if you are over 94 years old. Skin check. Lung cancer screening. You may have this screening every year starting at age 98 if you have a 30-pack-year history of smoking and currently smoke or have quit within the past 15 years. Fecal occult blood test (FOBT) of the stool. You may have this test every year starting at age 36. Flexible sigmoidoscopy or colonoscopy. You may have a sigmoidoscopy every 5 years or a colonoscopy every 10 years starting at age 32. Prostate cancer screening. Recommendations will vary depending on your family history and other risks. Hepatitis C blood test. Hepatitis B blood test. Sexually transmitted disease (STD) testing. Diabetes screening. This is done by checking your blood sugar (glucose) after you have not eaten for a while (fasting). You may have this done every 1-3 years. Discuss your test results, treatment options, and if necessary, the need for more tests with your health care provider. Vaccines  Your health care provider may recommend certain vaccines, such as: Influenza vaccine. This is recommended every year. Tetanus, diphtheria, and acellular pertussis (Tdap, Td) vaccine. You may need a Td booster every 10 years. Zoster vaccine. You may need this after age 87. Pneumococcal 13-valent conjugate (PCV13) vaccine. You may need this if you have certain conditions and have not been vaccinated. Pneumococcal polysaccharide (PPSV23) vaccine. You may need one or two doses if you smoke cigarettes or if you have certain conditions. Talk to your health care provider about which screenings and vaccines you need and how often you need them. This information is not intended to replace advice  given to you by your health care provider. Make sure you  discuss any questions you have with your health care provider. Document Released: 11/27/2015 Document Revised: 07/20/2016 Document Reviewed: 09/01/2015 Elsevier Interactive Patient Education  2017 [employer] Prevention in the Home Falls can cause injuries. They can happen to people of all ages. There are many things you can do to make your home safe and to help prevent falls. What can I do on the outside of my home? Regularly fix the edges of walkways and driveways and fix any cracks. Remove anything that might make you trip as you walk through a door, such as a raised step or threshold. Trim any bushes or trees on the path to your home. Use bright outdoor lighting. Clear any walking paths of anything that might make someone trip, such as rocks or tools. Regularly check to see if handrails are loose or broken. Make sure that both sides of any steps have handrails. Any raised decks and porches should have guardrails on the edges. Have any leaves, snow, or ice cleared regularly. Use sand or salt on walking paths during winter. Clean up any spills in your garage right away. This includes oil or grease spills. What can I do in the bathroom? Use night lights. Install grab bars by the toilet and in the tub and shower. Do not use towel bars as grab bars. Use non-skid mats or decals in the tub or shower. If you need to sit down in the shower, use a plastic, non-slip stool. Keep the floor dry. Clean up any water that spills on the floor as soon as it happens. Remove soap buildup in the tub or shower regularly. Attach bath mats securely with double-sided non-slip rug tape. Do not have throw rugs and other things on the floor that can make you trip. What can I do in the bedroom? Use night lights. Make sure that you have a light by your bed that is easy to reach. Do not use any sheets or blankets that are too big for your bed. They should not hang down onto the floor. Have a firm chair  that has side arms. You can use this for support while you get dressed. Do not have throw rugs and other things on the floor that can make you trip. What can I do in the kitchen? Clean up any spills right away. Avoid walking on wet floors. Keep items that you use a lot in easy-to-reach places. If you need to reach something above you, use a strong step stool that has a grab bar. Keep electrical cords out of the way. Do not use floor polish or wax that makes floors slippery. If you must use wax, use non-skid floor wax. Do not have throw rugs and other things on the floor that can make you trip. What can I do with my stairs? Do not leave any items on the stairs. Make sure that there are handrails on both sides of the stairs and use them. Fix handrails that are broken or loose. Make sure that handrails are as long as the stairways. Check any carpeting to make sure that it is firmly attached to the stairs. Fix any carpet that is loose or worn. Avoid having throw rugs at the top or bottom of the stairs. If you do have throw rugs, attach them to the floor with carpet tape. Make sure that you have a light switch at the top of the stairs and the bottom of  the stairs. If you do not have them, ask someone to add them for you. What else can I do to help prevent falls? Wear shoes that: Do not have high heels. Have rubber bottoms. Are comfortable and fit you well. Are closed at the toe. Do not wear sandals. If you use a stepladder: Make sure that it is fully opened. Do not climb a closed stepladder. Make sure that both sides of the stepladder are locked into place. Ask someone to hold it for you, if possible. Clearly mark and make sure that you can see: Any grab bars or handrails. First and last steps. Where the edge of each step is. Use tools that help you move around (mobility aids) if they are needed. These include: Canes. Walkers. Scooters. Crutches. Turn on the lights when you go into a  dark area. Replace any light bulbs as soon as they burn out. Set up your furniture so you have a clear path. Avoid moving your furniture around. If any of your floors are uneven, fix them. If there are any pets around you, be aware of where they are. Review your medicines with your doctor. Some medicines can make you feel dizzy. This can increase your chance of falling. Ask your doctor what other things that you can do to help prevent falls. This information is not intended to replace advice given to you by your health care provider. Make sure you discuss any questions you have with your health care provider. Document Released: 08/27/2009 Document Revised: 04/07/2016 Document Reviewed: 12/05/2014 Elsevier Interactive Patient Education  2017 [employer].

## 2021-07-05 NOTE — Progress Notes (Signed)
I connected with Cory Hayes today by telephone and verified that I am speaking with the correct person using two identifiers. Location patient: home Location provider: work Persons participating in the virtual visit: Peirce Meshach, [place] LPN.   I discussed the limitations, risks, security and privacy concerns of performing an evaluation and management service by telephone and the availability of in person appointments. I also discussed with the patient that there may be a patient responsible charge related to this service. The patient expressed understanding and verbally consented to this telephonic visit.    Interactive audio and video telecommunications were attempted between this provider and patient, however failed, due to patient having technical difficulties OR patient did not have access to video capability.  We continued and completed visit with audio only.     Vital signs may be patient reported or missing.  Subjective:   Cory Hayes is a 45 y.o. male who presents for an Initial Medicare Annual Wellness Visit.  Review of Systems     Cardiac Risk Factors include: male gender     Objective:    Today's Vitals   07/05/21 0856  Weight: 130 lb (59 kg)  Height: '5\' 8"'$  (1.727 m)   Body mass index is 19.77 kg/m.  Advanced Directives 07/05/2021 11/22/2019  Does Patient Have a Medical Advance Directive? Yes No  Type of Paramedic of [place];Living will -  Copy of [place] in Chart? No - copy requested -  Would patient like information on creating a medical advance directive? - No - Patient declined    Current Medications (verified) Outpatient Encounter Medications as of 07/05/2021  Medication Sig   cloBAZam (ONFI) 10 MG tablet Take 1 tablet by mouth 2 (two) times daily.   cyanocobalamin 1000 MCG tablet Take by mouth.   divalproex (DEPAKOTE) 250 MG DR tablet Take 5 tablets by mouth 2 (two) times daily.   GLEOSTINE 100 MG  capsule Take by mouth.   GLEOSTINE 40 MG capsule Take by mouth.   levETIRAcetam (KEPPRA) 1000 MG tablet Take 2,000 mg by mouth 2 (two) times daily.   magnesium oxide (MAG-OX) 400 MG tablet TAKE 1 TABLET BY MOUTH TWICE A DAY   Multiple Vitamin (QUINTABS) TABS Take 1 tablet by mouth daily.   ondansetron (ZOFRAN) 4 MG tablet Take 4 mg by mouth every 8 (eight) hours as needed.   pantoprazole (PROTONIX) 40 MG tablet Take 1 tablet (40 mg total) by mouth daily.   Potassium Chloride ER 20 MEQ TBCR TAKE 2 TABLETS (40 MEQ TOTAL) BY MOUTH 2 (TWO) TIMES DAILY WITH MEALS FOR 30 DAYS   sodium chloride 1 g tablet Take 1 g by mouth daily.   fludrocortisone (FLORINEF) 0.1 MG tablet Take 1 tablet by mouth daily. (Patient not taking: Reported on 07/05/2021)   gabapentin (NEURONTIN) 100 MG capsule Take 100 mg by mouth daily. (Patient not taking: Reported on 07/05/2021)   lomustine (CEENU) 40 MG capsule Take 4 capsules (total 160 mg) by mouth at night on day 1 of each cycle of chemotherapy (1 dose per 6 week cycle). (Patient not taking: Reported on 07/05/2021)   melatonin 3 MG TABS tablet Take 1 tablet by mouth daily as needed. (Patient not taking: Reported on 07/05/2021)   oxyCODONE (OXY IR/ROXICODONE) 5 MG immediate release tablet Take 5 mg by mouth every 6 (six) hours as needed. (Patient not taking: Reported on 07/05/2021)   No facility-administered encounter medications on file as of 07/05/2021.  Allergies (verified) Patient has no known allergies.   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   Seizures [facility])    Past Surgical History:  Procedure Laterality Date   BACK SURGERY  2017   CRANIOTOMY  2008, 2015, 12/21/20   Family History  Problem Relation Age of Onset   Throat cancer Father    Heart attack Maternal Grandmother    Ovarian cancer Maternal Grandmother    Stomach cancer Maternal Grandmother    Heart disease Maternal Grandmother    Prostate cancer Maternal  Grandfather    Social History   Socioeconomic History   Marital status: Single    Spouse name: Not on file   Number of children: Not on file   Years of education: Not on file   Highest education level: Not on file  Occupational History   Not on file  Tobacco Use   Smoking status: Former    Types: Cigarettes    Quit date: 2015    Years since quitting: 7.6   Smokeless tobacco: Never  Vaping Use   Vaping Use: Every day  Substance and Sexual Activity   Alcohol use: Not Currently    Comment: over 2 years ago   Drug use: Yes    Types: Marijuana    Comment: smokes daily per patient   Sexual activity: Not Currently  Other Topics Concern   Not on file  Social History Narrative   Not on file   Social Determinants of Health   Financial Resource Strain: Medium Risk   Difficulty of Paying Living Expenses: Somewhat hard  Food Insecurity: Food Insecurity Present   Worried About Running Out of Food in the Last Year: Sometimes true   Ran Out of Food in the Last Year: Sometimes true  Transportation Needs: No Transportation Needs   Lack of Transportation (Medical): No   Lack of Transportation (Non-Medical): No  Physical Activity: Insufficiently Active   Days of Exercise per Week: 2 days   Minutes of Exercise per Session: 60 min  Stress: Stress Concern Present   Feeling of Stress : To some extent  Social Connections: Not on file    Tobacco Counseling Counseling given: Not Answered   Clinical Intake:  Pre-visit preparation completed: Yes  Pain : No/denies pain     Nutritional Status: BMI of 19-24  Normal Nutritional Risks: Nausea/ vomitting/ diarrhea (nausea due to chemo) Diabetes: No  How often do you need to have someone help you when you read instructions, pamphlets, or other written materials from your doctor or pharmacy?: 1 - Never What is the last grade level you completed in school?: some college  Diabetic? no  Interpreter Needed?: No  Information entered by  :: NAllen LPN   Activities of Daily Living In your present state of health, do you have any difficulty performing the following activities: 07/05/2021 01/21/2021  Hearing? N N  Vision? Y Y  Comment - left eye  Difficulty concentrating or making decisions? Tempie Donning  Walking or climbing stairs? Y Y  Dressing or bathing? Tempie Donning  Comment trouble getting in and out of bath tub -  Doing errands, shopping? Tempie Donning  Preparing Food and eating ? Y -  Using the Toilet? N -  In the past six months, have you accidently leaked urine? Y -  Do you have problems with loss of bowel control? Y -  Managing your Medications? Y -  Managing your Finances? Y -  Housekeeping or managing  your Housekeeping? Y -  Some recent data might be hidden    Patient Care Team: Charlynne Cousins, MD as PCP - General (Internal Medicine)  Indicate any recent Medical Services you may have received from other than [facility] providers in the past year (date may be approximate).     Assessment:   This is a routine wellness examination for Cory Hayes.  Hearing/Vision screen No results found.  Dietary issues and exercise activities discussed: Current Exercise Habits: Home exercise routine, Type of exercise: Other - see comments (PT sessions), Time (Minutes): 60, Frequency (Times/Week): 2, Weekly Exercise (Minutes/Week): 120   Goals Addressed             This Visit's Progress    Patient Stated       07/05/2021, get stronger, start cooking again       Depression Screen PHQ 2/9 Scores 07/05/2021 05/25/2021 05/19/2021 02/11/2021 01/21/2021  PHQ - 2 Score 3 0 0 1 1  PHQ- 9 Score 9 - - - -    Fall Risk Fall Risk  07/05/2021 05/25/2021 05/19/2021 02/11/2021 01/21/2021  Falls in the past year? 1 0 '1 1 1  '$ Comment trouble with left side control - - - -  Number falls in past yr: 1 0 '1 1 1  '$ Injury with Fall? 0 0 1 0 0  Risk for fall due to : Impaired balance/gait;Impaired mobility;Medication side effect No Fall Risks Impaired mobility - Impaired  mobility  Follow up Falls evaluation completed;Education provided;Falls prevention discussed Falls evaluation completed Falls evaluation completed - Falls evaluation completed    FALL RISK PREVENTION PERTAINING TO THE HOME:  Any stairs in or around the home? Yes  If so, are there any without handrails? No  Home free of loose throw rugs in walkways, pet beds, electrical cords, etc? Yes  Adequate lighting in your home to reduce risk of falls? Yes   ASSISTIVE DEVICES UTILIZED TO PREVENT FALLS:  Life alert? No  Use of a cane, walker or w/c? Yes  Grab bars in the bathroom? Yes  Shower chair or bench in shower? No  Elevated toilet seat or a handicapped toilet? Yes   TIMED UP AND GO:  Was the test performed? No .      Cognitive Function:     6CIT Screen 07/05/2021  What Year? 0 points  What month? 0 points  What time? 3 points  Count back from 20 0 points  Months in reverse 2 points  Repeat phrase 2 points  Total Score 7    Immunizations Immunization History  Administered Date(s) Administered   Influenza-Unspecified 09/01/2020   PFIZER(Purple Top)SARS-COV-2 Vaccination 02/13/2020, 03/05/2020, 09/01/2020   Tdap 05/20/2021    TDAP status: Up to date  Flu Vaccine status: Due, Education has been provided regarding the importance of this vaccine. Advised may receive this vaccine at local pharmacy or Health Dept. Aware to provide a copy of the vaccination record if obtained from local pharmacy or Health Dept. Verbalized acceptance and understanding.  Pneumococcal vaccine status: Declined,  Education has been provided regarding the importance of this vaccine but patient still declined. Advised may receive this vaccine at local pharmacy or Health Dept. Aware to provide a copy of the vaccination record if obtained from local pharmacy or Health Dept. Verbalized acceptance and understanding.   Covid-19 vaccine status: Completed vaccines  Qualifies for Shingles Vaccine? No    Zostavax completed No   Shingrix Completed?: n/a  Screening Tests Health Maintenance  Topic Date Due  COVID-19 Vaccine (4 - Booster for Pfizer series) 12/02/2020   INFLUENZA VACCINE  06/14/2021   Hepatitis C Screening  01/21/2022 (Originally 05/09/1995)   HIV Screening  01/21/2022 (Originally 05/08/1992)   Pneumococcal Vaccine 58-61 Years old (1 - PCV) 05/19/2022 (Originally 05/09/1983)   TETANUS/TDAP  05/21/2031   HPV VACCINES  Aged Out    Health Maintenance  Health Maintenance Due  Topic Date Due   COVID-19 Vaccine (4 - Booster for Pfizer series) 12/02/2020   INFLUENZA VACCINE  06/14/2021    Colorectal cancer screening: n/a  Lung Cancer Screening: (Low Dose CT Chest recommended if Age 73-80 years, 30 pack-year currently smoking OR have quit w/in 15years.) does not qualify.   Lung Cancer Screening Referral: no  Additional Screening:  Hepatitis C Screening: does qualify; decline  Vision Screening: Recommended annual ophthalmology exams for early detection of glaucoma and other disorders of the eye. Is the patient up to date with their annual eye exam?  No  Who is the provider or what is the name of the office in which the patient attends annual eye exams? none If pt is not established with a provider, would they like to be referred to a provider to establish care? No .   Dental Screening: Recommended annual dental exams for proper oral hygiene  Community Resource Referral / Chronic Care Management: CRR required this visit?  No   CCM required this visit?  No      Plan:     I have personally reviewed and noted the following in the patient's chart:   Medical and social history Use of alcohol, tobacco or illicit drugs  Current medications and supplements including opioid prescriptions. Patient is not currently taking opioid prescriptions. Functional ability and status Nutritional status Physical activity Advanced directives List of other  physicians Hospitalizations, surgeries, and ER visits in previous 12 months Vitals Screenings to include cognitive, depression, and falls Referrals and appointments  In addition, I have reviewed and discussed with patient certain preventive protocols, quality metrics, and best practice recommendations. A written personalized care plan for preventive services as well as general preventive health recommendations were provided to patient.     Kellie Simmering, LPN   [id number]   Nurse Notes:

## 2021-07-08 ENCOUNTER — Telehealth: Payer: Self-pay | Admitting: *Deleted

## 2021-07-08 NOTE — Telephone Encounter (Signed)
   Telephone encounter was:  Unsuccessful.  07/08/2021 Name: Cory Hayes MRN: [id number] DOB: 10-24-1976  Unsuccessful outbound call made today to assist with:  Food Insecurity  Outreach Attempt:  1st Attempt  A HIPAA compliant voice message was left requesting a return call.  Instructed patient to call back at   Instructed patient to call back at [phone]  at their earliest convenience. . [place], Care Management  [phone] [address] , [place] [address] Email : [email or web address] [email or web address]

## 2021-07-09 ENCOUNTER — Telehealth: Payer: Self-pay

## 2021-07-09 NOTE — Telephone Encounter (Signed)
Copied from [place] [id number]. Topic: Appointment Scheduling - Scheduling Inquiry for Clinic >> Jul 09, 2021  8:49 AM Tessa Lerner A wrote: Reason for CRM: Patient's mother received notice that the practice would like to reschedule patient's lab visit on the 30th   Patient's mother has stressed her desire for the patient to remain being tested roughly every 2 weeks and would like to keep the appt  Patient's mother would like to discuss this further with a member of clinical staff when possible  Please contact further when available >> Jul 09, 2021  9:36 AM Georgina Peer, CMA wrote: Converting to telephone encounter.     Called and spoke to patient's mother. Lab appointment kept for next week as patient has standing orders for labs.

## 2021-07-13 ENCOUNTER — Other Ambulatory Visit: Payer: Medicare Other

## 2021-07-13 ENCOUNTER — Telehealth: Payer: Self-pay | Admitting: *Deleted

## 2021-07-13 ENCOUNTER — Other Ambulatory Visit: Payer: Self-pay

## 2021-07-13 DIAGNOSIS — C719 Malignant neoplasm of brain, unspecified: Secondary | ICD-10-CM

## 2021-07-13 NOTE — Telephone Encounter (Signed)
   Telephone encounter was:  Successful.  07/13/2021 Name: Cory Hayes MRN: [id number] DOB: 1976-08-29  Cory Hayes is a 45 y.o. year old male who is a primary care patient of Vigg, Avanti, MD . The community resource team was consulted for assistance with Patient with brain cancer , has recently had surgery and is getting PT. on chemo., dr referred for financial  difficulties , patient has son requesting food assistance   Care guide performed the following interventions: Patient provided with information about care guide support team and interviewed to confirm resource needs Follow up call placed to community resources to determine status of patients referral.  Follow Up Plan:  Care guide will follow up with patient by phone over the next days  [place], Care Management  [phone] [address] , [place] [address] Email : Ashby Dawes. [email or web address]-moran [email or web address]

## 2021-07-14 LAB — BASIC METABOLIC PANEL
BUN/Creatinine Ratio: 10 (ref 9–20)
BUN: 9 mg/dL (ref 6–24)
CO2: 20 mmol/L (ref 20–29)
Calcium: 8.8 mg/dL (ref 8.7–10.2)
Chloride: 95 mmol/L — ABNORMAL LOW (ref 96–106)
Creatinine, Ser: 0.88 mg/dL (ref 0.76–1.27)
Glucose: 86 mg/dL (ref 65–99)
Potassium: 4.8 mmol/L (ref 3.5–5.2)
Sodium: 129 mmol/L — ABNORMAL LOW (ref 134–144)
eGFR: 109 mL/min/{1.73_m2} (ref >59)

## 2021-07-14 LAB — CBC WITH DIFFERENTIAL
Basophils Absolute: 0 x10E3/uL (ref 0.0–0.2)
Basos: 1 %
EOS (ABSOLUTE): 0.2 x10E3/uL (ref 0.0–0.4)
Eos: 7 %
Hematocrit: 36.7 % — ABNORMAL LOW (ref 37.5–51.0)
Hemoglobin: 13.1 g/dL (ref 13.0–17.7)
Immature Grans (Abs): 0 x10E3/uL (ref 0.0–0.1)
Immature Granulocytes: 0 %
Lymphocytes Absolute: 1.4 x10E3/uL (ref 0.7–3.1)
Lymphs: 51 %
MCH: 36.5 pg — ABNORMAL HIGH (ref 26.6–33.0)
MCHC: 35.7 g/dL (ref 31.5–35.7)
MCV: 102 fL — ABNORMAL HIGH (ref 79–97)
Monocytes Absolute: 0.2 x10E3/uL (ref 0.1–0.9)
Monocytes: 8 %
Neutrophils Absolute: 0.9 x10E3/uL — ABNORMAL LOW (ref 1.4–7.0)
Neutrophils: 33 %
RBC: 3.59 x10E6/uL — ABNORMAL LOW (ref 4.14–5.80)
RDW: 14.6 % (ref 11.6–15.4)
WBC: 2.7 x10E3/uL — ABNORMAL LOW (ref 3.4–10.8)

## 2021-07-20 ENCOUNTER — Telehealth: Payer: Self-pay | Admitting: *Deleted

## 2021-07-20 ENCOUNTER — Telehealth: Payer: Self-pay | Admitting: Internal Medicine

## 2021-07-20 NOTE — Telephone Encounter (Signed)
Patient's Mother, Cory Hayes, returned call.  Advised patient's mother that the assistance card is available for pickup and patient will need to be the person to pick the card up.

## 2021-07-20 NOTE — Telephone Encounter (Signed)
Received assistance card, called pt and left w/mom for pt to call the office back. Placed in completed forms folder.

## 2021-07-20 NOTE — Telephone Encounter (Signed)
   Telephone encounter was:  Successful.  07/20/2021 Name: Cory Hayes MRN: [id number] DOB: 1976-10-07  Cory Hayes is a 45 y.o. year old male who is a primary care patient of Vigg, Avanti, MD . The community resource team was consulted for assistance with Patient was provided a 300 [employer] gift card , mom is aware that card issued and will be sent to practice   Care guide performed the following Patient was provided a 300 [employer] gift card , mom is aware that card issued and will be sent to practice interventions: Patient provided with information about care guide support team and interviewed to confirm resource needs.  Follow Up Plan: No follow up needed  [place], Care Management  [phone] [address] , [place] [address] Email : Ashby [email or web address]. [email or web address]-moran [email or web address]

## 2021-07-21 NOTE — Telephone Encounter (Signed)
Patient came in and picked up assistance card.

## 2021-07-26 ENCOUNTER — Ambulatory Visit: Payer: Medicare Other | Admitting: Internal Medicine

## 2021-07-26 ENCOUNTER — Telehealth: Payer: Medicare Other | Admitting: Internal Medicine

## 2021-07-27 ENCOUNTER — Encounter: Payer: Self-pay | Admitting: Internal Medicine

## 2021-07-27 ENCOUNTER — Telehealth (INDEPENDENT_AMBULATORY_CARE_PROVIDER_SITE_OTHER): Payer: Medicare Other | Admitting: Internal Medicine

## 2021-07-27 ENCOUNTER — Other Ambulatory Visit: Payer: Self-pay

## 2021-07-27 DIAGNOSIS — E871 Hypo-osmolality and hyponatremia: Secondary | ICD-10-CM | POA: Diagnosis not present

## 2021-07-27 DIAGNOSIS — Z79899 Other long term (current) drug therapy: Secondary | ICD-10-CM

## 2021-07-27 DIAGNOSIS — G40909 Epilepsy, unspecified, not intractable, without status epilepticus: Secondary | ICD-10-CM

## 2021-07-27 DIAGNOSIS — C719 Malignant neoplasm of brain, unspecified: Secondary | ICD-10-CM

## 2021-07-27 DIAGNOSIS — Z789 Other specified health status: Secondary | ICD-10-CM

## 2021-07-27 NOTE — Progress Notes (Signed)
There were no vitals taken for this visit.   Subjective:    Patient ID: Cory Hayes, male    DOB: Sep 09, 1976, 45 y.o.   MRN: [id number]  Chief Complaint  Patient presents with   Astrocytoma    Patient states he is doing well, on his second round of chemo, has been staying out of bed longer,and  has even made dinner, has not fall or had any seizure activity. He states that PT is going great.     HPI: Cory Hayes is a 45 y.o. male   This visit was completed via telephone due to the restrictions of the COVID-19 pandemic. All issues as above were discussed and addressed but no physical exam was performed. If it was felt that the patient should be evaluated in the office, they were directed there. The patient verbally consented to this visit. Patient was unable to complete an audio/visual visit due to Technical difficulties. Due to the catastrophic nature of the COVID-19 pandemic, this visit was done through audio contact only. Location of the patient: home Location of the provider: work Those involved with this call:  Provider: Charlynne Cousins, MD CMA: Frazier Butt, [place] Desk/Registration: Roe Rutherford  Time spent on call: 10 minutes on the phone discussing health concers. 10 minutes total spent in review of patient's record and preparation of their chart. No more seizures no more falls per pt, PT is going well, going to the gym x 2 times a week,cooking again. More independent more. Chemo is going well, got sick after 4th dose -- took zofran helped . To fu with heme onc -        Chief Complaint  Patient presents with   Astrocytoma    Patient states he is doing well, on his second round of chemo, has been staying out of bed longer,and  has even made dinner, has not fall or had any seizure activity. He states that PT is going great.     Relevant past medical, surgical, family and social history reviewed and updated as indicated. Interim medical history since our last visit  reviewed. Allergies and medications reviewed and updated.  Review of Systems  Constitutional:  Negative for activity change, appetite change, chills, fatigue and fever.  HENT:  Negative for congestion, ear discharge, ear pain and facial swelling.   Eyes:  Negative for pain, discharge and itching.  Respiratory:  Negative for cough, chest tightness, shortness of breath and wheezing.   Cardiovascular:  Negative for chest pain, palpitations and leg swelling.  Gastrointestinal:  Negative for abdominal distention, abdominal pain, blood in stool, constipation, diarrhea, nausea and vomiting.  Endocrine: Negative for cold intolerance, heat intolerance, polydipsia, polyphagia and polyuria.  Genitourinary:  Negative for difficulty urinating, dysuria, flank pain, frequency, hematuria and urgency.  Musculoskeletal:  Negative for arthralgias, gait problem, joint swelling and myalgias.  Skin:  Negative for color change, rash and wound.  Neurological:  Negative for dizziness, tremors, speech difficulty, weakness, light-headedness, numbness and headaches.  Hematological:  Does not bruise/bleed easily.  Psychiatric/Behavioral:  Negative for agitation, confusion, decreased concentration, sleep disturbance and suicidal ideas.    Per HPI unless specifically indicated above     Objective:    There were no vitals taken for this visit.  Wt Readings from Last 3 Encounters:  07/05/21 130 lb (59 kg)  05/25/21 133 lb (60.3 kg)  05/20/21 133 lb (60.3 kg)    Physical Exam  Unable to peform sec to virtual visit.   Results for  orders placed or performed in visit on 00/17/49  Basic metabolic panel  Result Value Ref Range   Glucose 86 65 - 99 mg/dL   BUN 9 6 - 24 mg/dL   Creatinine, Ser 0.88 0.76 - 1.27 mg/dL   eGFR 109 >59 mL/min/1.73   BUN/Creatinine Ratio 10 9 - 20   Sodium 129 (L) 134 - 144 mmol/L   Potassium 4.8 3.5 - 5.2 mmol/L   Chloride 95 (L) 96 - 106 mmol/L   CO2 20 20 - 29 mmol/L   Calcium 8.8  8.7 - 10.2 mg/dL  CBC With Differential  Result Value Ref Range   WBC 2.7 (L) 3.4 - 10.8 x10E3/uL   RBC 3.59 (L) 4.14 - 5.80 x10E6/uL   Hemoglobin 13.1 13.0 - 17.7 g/dL   Hematocrit 36.7 (L) 37.5 - 51.0 %   MCV 102 (H) 79 - 97 fL   MCH 36.5 (H) 26.6 - 33.0 pg   MCHC 35.7 31.5 - 35.7 g/dL   RDW 14.6 11.6 - 15.4 %   Neutrophils 33 Not Estab. %   Lymphs 51 Not Estab. %   Monocytes 8 Not Estab. %   Eos 7 Not Estab. %   Basos 1 Not Estab. %   Neutrophils Absolute 0.9 (L) 1.4 - 7.0 x10E3/uL   Lymphocytes Absolute 1.4 0.7 - 3.1 x10E3/uL   Monocytes Absolute 0.2 0.1 - 0.9 x10E3/uL   EOS (ABSOLUTE) 0.2 0.0 - 0.4 x10E3/uL   Basophils Absolute 0.0 0.0 - 0.2 x10E3/uL   Immature Granulocytes 0 Not Estab. %   Immature Grans (Abs) 0.0 0.0 - 0.1 x10E3/uL   Hematology Comments: Note:         Current Outpatient Medications:    cloBAZam (ONFI) 10 MG tablet, Take 1 tablet by mouth 2 (two) times daily., Disp: , Rfl:    cyanocobalamin 1000 MCG tablet, Take by mouth., Disp: , Rfl:    divalproex (DEPAKOTE) 250 MG DR tablet, Take 5 tablets by mouth 2 (two) times daily., Disp: , Rfl:    GLEOSTINE 100 MG capsule, Take by mouth., Disp: , Rfl:    GLEOSTINE 40 MG capsule, Take by mouth., Disp: , Rfl:    levETIRAcetam (KEPPRA) 1000 MG tablet, Take 2,000 mg by mouth 2 (two) times daily., Disp: , Rfl:    lomustine (CEENU) 40 MG capsule, , Disp: , Rfl:    magnesium oxide (MAG-OX) 400 MG tablet, TAKE 1 TABLET BY MOUTH TWICE A DAY, Disp: 180 tablet, Rfl: 1   melatonin 3 MG TABS tablet, Take 1 tablet by mouth daily as needed., Disp: , Rfl:    Multiple Vitamin (QUINTABS) TABS, Take 1 tablet by mouth daily., Disp: , Rfl:    ondansetron (ZOFRAN) 4 MG tablet, Take 4 mg by mouth every 8 (eight) hours as needed., Disp: , Rfl:    pantoprazole (PROTONIX) 40 MG tablet, Take 1 tablet (40 mg total) by mouth daily., Disp: 30 tablet, Rfl: 6   Potassium Chloride ER 20 MEQ TBCR, TAKE 2 TABLETS (40 MEQ TOTAL) BY MOUTH 2 (TWO)  TIMES DAILY WITH MEALS FOR 30 DAYS, Disp: 540 tablet, Rfl: 2   sodium chloride 1 g tablet, Take 1 g by mouth daily., Disp: , Rfl:     Assessment & Plan:  Hyponatremia is on Sodium tablets for such  Na stable, to use the above and continue currnet mx  2. Neutropenia + noted on CBC s/p chemotherapy x 4 doses ot fu with hemeonc for such   3.  Seizure  disorder on Keppra and Depakote for such Chronic stable no recurrence   Problem List Items Addressed This Visit   None    No orders of the defined types were placed in this encounter.    No orders of the defined types were placed in this encounter.    Follow up plan: No follow-ups on file.

## 2021-07-30 ENCOUNTER — Other Ambulatory Visit: Payer: Self-pay

## 2021-07-30 ENCOUNTER — Ambulatory Visit: Payer: Medicare Other

## 2021-07-30 ENCOUNTER — Other Ambulatory Visit: Payer: Medicare Other

## 2021-07-30 DIAGNOSIS — Z79899 Other long term (current) drug therapy: Secondary | ICD-10-CM

## 2021-07-30 DIAGNOSIS — C719 Malignant neoplasm of brain, unspecified: Secondary | ICD-10-CM

## 2021-07-30 DIAGNOSIS — Z789 Other specified health status: Secondary | ICD-10-CM

## 2021-07-31 LAB — CBC WITH DIFFERENTIAL/PLATELET
Basophils Absolute: 0 10*3/uL (ref 0.0–0.2)
Basos: 0 %
EOS (ABSOLUTE): 0.1 10*3/uL (ref 0.0–0.4)
Eos: 3 %
Hematocrit: 37.4 % — ABNORMAL LOW (ref 37.5–51.0)
Hemoglobin: 13.4 g/dL (ref 13.0–17.7)
Immature Grans (Abs): 0 10*3/uL (ref 0.0–0.1)
Immature Granulocytes: 1 %
Lymphocytes Absolute: 1 10*3/uL (ref 0.7–3.1)
Lymphs: 28 %
MCH: 36.3 pg — ABNORMAL HIGH (ref 26.6–33.0)
MCHC: 35.8 g/dL — ABNORMAL HIGH (ref 31.5–35.7)
MCV: 101 fL — ABNORMAL HIGH (ref 79–97)
Monocytes Absolute: 0.4 10*3/uL (ref 0.1–0.9)
Monocytes: 11 %
Neutrophils Absolute: 2 10*3/uL (ref 1.4–7.0)
Neutrophils: 57 %
Platelets: 132 10*3/uL — ABNORMAL LOW (ref 150–450)
RBC: 3.69 x10E6/uL — ABNORMAL LOW (ref 4.14–5.80)
RDW: 14.3 % (ref 11.6–15.4)
WBC: 3.5 10*3/uL (ref 3.4–10.8)

## 2021-07-31 LAB — BASIC METABOLIC PANEL
BUN/Creatinine Ratio: 10 (ref 9–20)
BUN: 9 mg/dL (ref 6–24)
CO2: 21 mmol/L (ref 20–29)
Calcium: 8.9 mg/dL (ref 8.7–10.2)
Chloride: 95 mmol/L — ABNORMAL LOW (ref 96–106)
Creatinine, Ser: 0.9 mg/dL (ref 0.76–1.27)
Glucose: 79 mg/dL (ref 65–99)
Potassium: 4.6 mmol/L (ref 3.5–5.2)
Sodium: 130 mmol/L — ABNORMAL LOW (ref 134–144)
eGFR: 108 mL/min/{1.73_m2} (ref >59)

## 2021-08-05 ENCOUNTER — Telehealth: Payer: Self-pay

## 2021-08-05 NOTE — Telephone Encounter (Signed)
Copied from [place] [id number]. Topic: General - Inquiry >> Aug 05, 2021  9:35 AM Greggory Keen D wrote: Reason for CRM: Pt's mom called asking that a nurse call her back regarding son's visit at [facility] yesterday. [phone]

## 2021-08-06 NOTE — Telephone Encounter (Signed)
Patient mother Jacqlyn Larsen states her went to [facility] for an MRI and informed patient that they would like for his primary care physician to refer patient to see a Urologist. Patient mother also states that patient will be doing 2 more rounds of Chemo. Round 5 will be October 5th and then six weeks after that appointment he will have another round. Patient mother states they are requesting lab work to be performed every 2 weeks through his primary care. Please advise?

## 2021-08-10 NOTE — Telephone Encounter (Signed)
He has been doing this we can continue pl let them know

## 2021-08-17 ENCOUNTER — Encounter: Payer: Self-pay | Admitting: Internal Medicine

## 2021-08-17 ENCOUNTER — Telehealth (INDEPENDENT_AMBULATORY_CARE_PROVIDER_SITE_OTHER): Payer: Medicare Other | Admitting: Internal Medicine

## 2021-08-17 VITALS — Ht 68.0 in | Wt 130.0 lb

## 2021-08-17 DIAGNOSIS — C719 Malignant neoplasm of brain, unspecified: Secondary | ICD-10-CM

## 2021-08-17 DIAGNOSIS — E871 Hypo-osmolality and hyponatremia: Secondary | ICD-10-CM

## 2021-08-17 DIAGNOSIS — R350 Frequency of micturition: Secondary | ICD-10-CM | POA: Insufficient documentation

## 2021-08-17 NOTE — Progress Notes (Signed)
Ht 5\' 8"  (1.727 m)   Wt 130 lb (59 kg)   BMI 19.77 kg/m    Subjective:    Patient ID: Cory Hayes, male    DOB: 1976-10-29, 45 y.o.   MRN: [id number]  Chief Complaint  Patient presents with   Astrocytoma    HPI: Cory Hayes is a 45 y.o. male   This visit was completed via video visit through MyChart due to the restrictions of the COVID-19 pandemic. All issues as above were discussed and addressed. Physical exam was done as above through visual confirmation on video through MyChart. If it was felt that the patient should be evaluated in the office, they were directed there. The patient verbally consented to this visit. Location of the patient: home Location of the provider: work Those involved with this call:  Provider: 59, MD CMA: Loura Pardon, CMA Front Desk/Registration: Tristan Schroeder  Time spent on call: 10 minutes with patient face to face via video conference. More than 50% of this time was spent in counseling and coordination of care. 10 minutes total spent in review of patient's record and preparation of their chart.  Saw oncology @ [facility] to have 2 more rounds of chemo    [facility] , sees [facility] oncologist to fu with neurology per mom.  Last bw shows platelets were 132   Sodium continues to be low at 130 - no confusion or AMS  Chief Complaint  Patient presents with   Astrocytoma    Relevant past medical, surgical, family and social history reviewed and updated as indicated. Interim medical history since our last visit reviewed. Allergies and medications reviewed and updated.  Review of Systems  Constitutional:  Negative for activity change, appetite change, chills, fatigue and fever.  HENT:  Negative for congestion, ear discharge, ear pain and facial swelling.   Respiratory:  Negative for cough, chest tightness, shortness of breath and wheezing.   Cardiovascular:  Negative for chest pain, palpitations and leg  swelling.  Gastrointestinal:  Negative for abdominal distention, abdominal pain, diarrhea and nausea.  Genitourinary:  Negative for difficulty urinating, dysuria, flank pain, frequency, hematuria and urgency.  Musculoskeletal:  Negative for arthralgias, gait problem, joint swelling and myalgias.  Neurological:  Positive for weakness. Negative for dizziness, tremors, speech difficulty, light-headedness, numbness and headaches.  Hematological:  Does not bruise/bleed easily.   Per HPI unless specifically indicated above     Objective:    Ht 5\' 8"  (1.727 m)   Wt 130 lb (59 kg)   BMI 19.77 kg/m   Wt Readings from Last 3 Encounters:  08/17/21 130 lb (59 kg)  07/05/21 130 lb (59 kg)  05/25/21 133 lb (60.3 kg)    Physical Exam Constitutional:      General: He is not in acute distress.    Appearance: He is not ill-appearing, toxic-appearing or diaphoretic.  HENT:     Head: Normocephalic and atraumatic.     Nose: No congestion or rhinorrhea.  Skin:    Coloration: Skin is not pale.  Neurological:     Mental Status: He is alert.  Psychiatric:        Mood and Affect: Mood normal.        Behavior: Behavior normal.        Thought Content: Thought content normal.        Judgment: Judgment normal.    Results for orders placed or performed in visit on 07/30/21  Basic Metabolic Panel (BMET)  Result Value Ref Range   Glucose 79 65 - 99 mg/dL   BUN 9 6 - 24 mg/dL   Creatinine, Ser 0.90 0.76 - 1.27 mg/dL   eGFR 108 >59 mL/min/1.73   BUN/Creatinine Ratio 10 9 - 20   Sodium 130 (L) 134 - 144 mmol/L   Potassium 4.6 3.5 - 5.2 mmol/L   Chloride 95 (L) 96 - 106 mmol/L   CO2 21 20 - 29 mmol/L   Calcium 8.9 8.7 - 10.2 mg/dL  CBC with Differential/Platelet  Result Value Ref Range   WBC 3.5 3.4 - 10.8 x10E3/uL   RBC 3.69 (L) 4.14 - 5.80 x10E6/uL   Hemoglobin 13.4 13.0 - 17.7 g/dL   Hematocrit 37.4 (L) 37.5 - 51.0 %   MCV 101 (H) 79 - 97 fL   MCH 36.3 (H) 26.6 - 33.0 pg   MCHC 35.8 (H)  31.5 - 35.7 g/dL   RDW 14.3 11.6 - 15.4 %   Platelets 132 (L) 150 - 450 x10E3/uL   Neutrophils 57 Not Estab. %   Lymphs 28 Not Estab. %   Monocytes 11 Not Estab. %   Eos 3 Not Estab. %   Basos 0 Not Estab. %   Neutrophils Absolute 2.0 1.4 - 7.0 x10E3/uL   Lymphocytes Absolute 1.0 0.7 - 3.1 x10E3/uL   Monocytes Absolute 0.4 0.1 - 0.9 x10E3/uL   EOS (ABSOLUTE) 0.1 0.0 - 0.4 x10E3/uL   Basophils Absolute 0.0 0.0 - 0.2 x10E3/uL   Immature Granulocytes 1 Not Estab. %   Immature Grans (Abs) 0.0 0.0 - 0.1 x10E3/uL        Current Outpatient Medications:    cloBAZam (ONFI) 10 MG tablet, Take 1 tablet by mouth 2 (two) times daily., Disp: , Rfl:    cyanocobalamin 1000 MCG tablet, Take by mouth., Disp: , Rfl:    divalproex (DEPAKOTE) 250 MG DR tablet, Take 5 tablets by mouth 2 (two) times daily., Disp: , Rfl:    GLEOSTINE 100 MG capsule, Take by mouth., Disp: , Rfl:    levETIRAcetam (KEPPRA) 1000 MG tablet, Take 2,000 mg by mouth 2 (two) times daily., Disp: , Rfl:    lomustine (CEENU) 40 MG capsule, Every 6 weeks., Disp: , Rfl:    magnesium oxide (MAG-OX) 400 MG tablet, TAKE 1 TABLET BY MOUTH TWICE A DAY, Disp: 180 tablet, Rfl: 1   melatonin 3 MG TABS tablet, Take 1 tablet by mouth daily as needed., Disp: , Rfl:    Multiple Vitamin (QUINTABS) TABS, Take 1 tablet by mouth daily., Disp: , Rfl:    ondansetron (ZOFRAN) 4 MG tablet, Take 4 mg by mouth every 8 (eight) hours as needed., Disp: , Rfl:    pantoprazole (PROTONIX) 40 MG tablet, Take 1 tablet (40 mg total) by mouth daily., Disp: 30 tablet, Rfl: 6   Potassium Chloride ER 20 MEQ TBCR, TAKE 2 TABLETS (40 MEQ TOTAL) BY MOUTH 2 (TWO) TIMES DAILY WITH MEALS FOR 30 DAYS (Patient taking differently: Take 20 mEq by mouth daily.), Disp: 540 tablet, Rfl: 2   sodium chloride 1 g tablet, Take 1 g by mouth 2 (two) times daily with a meal., Disp: , Rfl:     Assessment & Plan:  Hyponatremia : is on sodium chloride 1gm bid for such  To fu with  nephrology ? 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   Problem  List Items Addressed This Visit       Other   Hyponatremia - Primary   Relevant Orders   Ambulatory referral to Nephrology   Urinary frequency   Relevant Orders   Urinalysis, Routine w reflex microscopic    Orders Placed This Encounter  Procedures   Urinalysis, Routine w reflex microscopic   Ambulatory referral to Nephrology     No orders of the defined types were placed in this encounter.    Follow up plan: Return in about 3 months (around 11/17/2021).

## 2021-08-18 ENCOUNTER — Other Ambulatory Visit: Payer: Self-pay

## 2021-08-18 ENCOUNTER — Other Ambulatory Visit: Payer: Medicare Other

## 2021-08-18 DIAGNOSIS — Z789 Other specified health status: Secondary | ICD-10-CM

## 2021-08-18 DIAGNOSIS — C719 Malignant neoplasm of brain, unspecified: Secondary | ICD-10-CM

## 2021-08-18 DIAGNOSIS — Z79899 Other long term (current) drug therapy: Secondary | ICD-10-CM

## 2021-08-19 LAB — CBC WITH DIFFERENTIAL/PLATELET
Basophils Absolute: 0 10*3/uL (ref 0.0–0.2)
Basos: 0 %
EOS (ABSOLUTE): 0.3 10*3/uL (ref 0.0–0.4)
Eos: 9 %
Hematocrit: 36.4 % — ABNORMAL LOW (ref 37.5–51.0)
Hemoglobin: 12.7 g/dL — ABNORMAL LOW (ref 13.0–17.7)
Immature Grans (Abs): 0 10*3/uL (ref 0.0–0.1)
Immature Granulocytes: 0 %
Lymphocytes Absolute: 1.2 10*3/uL (ref 0.7–3.1)
Lymphs: 31 %
MCH: 36.8 pg — ABNORMAL HIGH (ref 26.6–33.0)
MCHC: 34.9 g/dL (ref 31.5–35.7)
MCV: 106 fL — ABNORMAL HIGH (ref 79–97)
Monocytes Absolute: 0.4 10*3/uL (ref 0.1–0.9)
Monocytes: 10 %
Neutrophils Absolute: 1.9 10*3/uL (ref 1.4–7.0)
Neutrophils: 50 %
Platelets: 156 10*3/uL (ref 150–450)
RBC: 3.45 x10E6/uL — ABNORMAL LOW (ref 4.14–5.80)
RDW: 13.9 % (ref 11.6–15.4)
WBC: 3.8 10*3/uL (ref 3.4–10.8)

## 2021-08-19 LAB — BASIC METABOLIC PANEL
BUN/Creatinine Ratio: 11 (ref 9–20)
BUN: 10 mg/dL (ref 6–24)
CO2: 21 mmol/L (ref 20–29)
Calcium: 9 mg/dL (ref 8.7–10.2)
Chloride: 95 mmol/L — ABNORMAL LOW (ref 96–106)
Creatinine, Ser: 0.91 mg/dL (ref 0.76–1.27)
Glucose: 84 mg/dL (ref 70–99)
Potassium: 4.9 mmol/L (ref 3.5–5.2)
Sodium: 129 mmol/L — ABNORMAL LOW (ref 134–144)
eGFR: 107 mL/min/{1.73_m2} (ref >59)

## 2021-08-19 NOTE — Progress Notes (Signed)
Patient made aware of results and verbalized understanding.  

## 2021-08-19 NOTE — Progress Notes (Signed)
Pl let pt know platelets better sodium still low normal. Needs to continue increasing salt in diet.

## 2021-08-26 ENCOUNTER — Other Ambulatory Visit: Payer: Self-pay

## 2021-08-26 ENCOUNTER — Telehealth: Payer: Self-pay

## 2021-08-26 DIAGNOSIS — R350 Frequency of micturition: Secondary | ICD-10-CM

## 2021-08-26 LAB — URINALYSIS, ROUTINE W REFLEX MICROSCOPIC
Bilirubin, UA: NEGATIVE
Glucose, UA: NEGATIVE
Leukocytes,UA: NEGATIVE
Nitrite, UA: NEGATIVE
Protein,UA: NEGATIVE
RBC, UA: NEGATIVE
Specific Gravity, UA: 1.02 (ref 1.005–1.030)
Urobilinogen, Ur: 1 mg/dL (ref 0.2–1.0)
pH, UA: 7 (ref 5.0–7.5)

## 2021-08-26 NOTE — Telephone Encounter (Signed)
Urinary frequency

## 2021-08-27 NOTE — Telephone Encounter (Signed)
Pts mother called in wanting to get an update on the Urology referral that was supposed to be sent over, and wanted someone to reach out to her, please advise.

## 2021-08-27 NOTE — Telephone Encounter (Signed)
Returned patient's mother's call about patients urology referral. I gave Patient number to [facility] that was on referral. Mother stated that she would try that number.

## 2021-08-29 LAB — URINE CULTURE

## 2021-09-01 ENCOUNTER — Other Ambulatory Visit: Payer: Medicare Other

## 2021-09-02 ENCOUNTER — Other Ambulatory Visit: Payer: Medicare Other

## 2021-09-02 ENCOUNTER — Telehealth: Payer: Self-pay | Admitting: Internal Medicine

## 2021-09-02 ENCOUNTER — Other Ambulatory Visit: Payer: Self-pay

## 2021-09-02 DIAGNOSIS — C719 Malignant neoplasm of brain, unspecified: Secondary | ICD-10-CM

## 2021-09-02 DIAGNOSIS — Z789 Other specified health status: Secondary | ICD-10-CM

## 2021-09-02 DIAGNOSIS — Z79899 Other long term (current) drug therapy: Secondary | ICD-10-CM

## 2021-09-02 NOTE — Telephone Encounter (Signed)
Pt came in to see if we could check on his referral for him to go to [facility] b/c he has not heard anything from them and they told him to check with [place] to see if we can resend it.  It looks as if it was originally sent on 08/20/2021 and they told the pt they have not received it.

## 2021-09-02 NOTE — Telephone Encounter (Signed)
Spoke with Patient to inform him that the referral was just resent to [facility]

## 2021-09-03 LAB — CBC WITH DIFFERENTIAL/PLATELET
Basophils Absolute: 0 10*3/uL (ref 0.0–0.2)
Basos: 0 %
EOS (ABSOLUTE): 0.1 10*3/uL (ref 0.0–0.4)
Eos: 2 %
Hematocrit: 35.4 % — ABNORMAL LOW (ref 37.5–51.0)
Hemoglobin: 12.4 g/dL — ABNORMAL LOW (ref 13.0–17.7)
Immature Grans (Abs): 0 10*3/uL (ref 0.0–0.1)
Immature Granulocytes: 1 %
Lymphocytes Absolute: 1.1 10*3/uL (ref 0.7–3.1)
Lymphs: 38 %
MCH: 36.7 pg — ABNORMAL HIGH (ref 26.6–33.0)
MCHC: 35 g/dL (ref 31.5–35.7)
MCV: 105 fL — ABNORMAL HIGH (ref 79–97)
Monocytes Absolute: 0.3 10*3/uL (ref 0.1–0.9)
Monocytes: 9 %
Neutrophils Absolute: 1.4 10*3/uL (ref 1.4–7.0)
Neutrophils: 50 %
Platelets: 176 10*3/uL (ref 150–450)
RBC: 3.38 x10E6/uL — ABNORMAL LOW (ref 4.14–5.80)
RDW: 13.6 % (ref 11.6–15.4)
WBC: 2.8 10*3/uL — ABNORMAL LOW (ref 3.4–10.8)

## 2021-09-03 LAB — BASIC METABOLIC PANEL
BUN/Creatinine Ratio: 12 (ref 9–20)
BUN: 10 mg/dL (ref 6–24)
CO2: 21 mmol/L (ref 20–29)
Calcium: 8.7 mg/dL (ref 8.7–10.2)
Chloride: 96 mmol/L (ref 96–106)
Creatinine, Ser: 0.81 mg/dL (ref 0.76–1.27)
Glucose: 121 mg/dL — ABNORMAL HIGH (ref 70–99)
Potassium: 4.5 mmol/L (ref 3.5–5.2)
Sodium: 131 mmol/L — ABNORMAL LOW (ref 134–144)
eGFR: 111 mL/min/{1.73_m2} (ref >59)

## 2021-09-09 ENCOUNTER — Ambulatory Visit: Payer: Self-pay

## 2021-09-09 NOTE — Telephone Encounter (Signed)
Patient called, left VM to return the call to the office to discuss symptoms with a nurse.   Message from Parke Poisson sent at 09/09/2021  3:23 PM EDT  Pt's mother called that pt has had diarrhea for 2 weeks.He has been taking magnesium oxide 400 mg 2 x daily  She states that pt stools varies in hardness.Please advise

## 2021-09-09 NOTE — Telephone Encounter (Signed)
Unable to reach after 3 attempts by [facility] NT.  Pt's mother called that pt has had diarrhea for 2 weeks.He has been taking magnesium oxide 400 mg 2 x daily  She states that pt stools varies in hardness.Please advise

## 2021-09-10 NOTE — Telephone Encounter (Signed)
Called patient's mother, Jacqlyn Larsen, to follow up on concern, lmom.

## 2021-09-13 ENCOUNTER — Telehealth: Payer: Self-pay | Admitting: Internal Medicine

## 2021-09-13 DIAGNOSIS — R197 Diarrhea, unspecified: Secondary | ICD-10-CM

## 2021-09-13 NOTE — Telephone Encounter (Signed)
Pt mother called and stated that she would like a call back from the nurse. Pt mother stated that she was told my oncology that patient needed a stool sample done. Can we do this? Please advise

## 2021-09-13 NOTE — Telephone Encounter (Signed)
Sounds good thnx. Needs to get stool studies / culture - including Ova and parasites in the meanwhile.

## 2021-09-13 NOTE — Telephone Encounter (Signed)
Patient has been notified to come and pick up stool studies.

## 2021-09-13 NOTE — Addendum Note (Signed)
Addended by: Donzetta Kohut A on: 09/13/2021 03:06 PM   Modules accepted: Orders

## 2021-09-13 NOTE — Telephone Encounter (Signed)
Spoke with patient's mother, she stated that he is still currently taking Magnesium 400mg  BID. Informed her per PCP to have patient stop taking and see how things go, will check with them about this issue on 11/3 when they come in for lab tests.

## 2021-09-15 ENCOUNTER — Other Ambulatory Visit: Payer: Medicare Other

## 2021-09-15 DIAGNOSIS — R197 Diarrhea, unspecified: Secondary | ICD-10-CM

## 2021-09-16 ENCOUNTER — Encounter: Payer: Self-pay | Admitting: Internal Medicine

## 2021-09-16 ENCOUNTER — Other Ambulatory Visit: Payer: Medicare Other

## 2021-09-16 ENCOUNTER — Ambulatory Visit (INDEPENDENT_AMBULATORY_CARE_PROVIDER_SITE_OTHER): Payer: Medicare Other | Admitting: Internal Medicine

## 2021-09-16 ENCOUNTER — Other Ambulatory Visit: Payer: Self-pay

## 2021-09-16 VITALS — BP 114/80 | HR 81 | Temp 98.3°F | Ht 67.99 in | Wt 142.2 lb

## 2021-09-16 DIAGNOSIS — Z789 Other specified health status: Secondary | ICD-10-CM

## 2021-09-16 DIAGNOSIS — R110 Nausea: Secondary | ICD-10-CM | POA: Diagnosis not present

## 2021-09-16 DIAGNOSIS — R197 Diarrhea, unspecified: Secondary | ICD-10-CM | POA: Diagnosis not present

## 2021-09-16 DIAGNOSIS — Z79899 Other long term (current) drug therapy: Secondary | ICD-10-CM

## 2021-09-16 DIAGNOSIS — C719 Malignant neoplasm of brain, unspecified: Secondary | ICD-10-CM

## 2021-09-16 LAB — VERITOR FLU A/B WAIVED
Influenza A: NEGATIVE
Influenza B: NEGATIVE

## 2021-09-16 MED ORDER — LACTINEX PO CHEW: 1.0000 | CHEWABLE_TABLET | Freq: Three times a day (TID) | ORAL | 1 refills | Status: DC

## 2021-09-16 NOTE — Progress Notes (Signed)
BP 114/80   Pulse 81   Temp 98.3 F (36.8 C) (Oral)   Ht 5' 7.99" (1.727 m)   Wt 142 lb 3.2 oz (64.5 kg)   SpO2 98%   BMI 21.63 kg/m    Subjective:    Patient ID: Cory Hayes, male    DOB: 06-May-1976, 45 y.o.   MRN: [id number]  Chief Complaint  Patient presents with   Abdominal Pain    Has had diarrhea for past few weeks, has stopped magnesium as of Monday    nausea    Since yesterday     HPI: Cory Hayes is a 45 y.o. male  Abdominal Pain This is a new (has some discomfort x 1 month ago. 5th of october) problem. The current episode started in the past 7 days. The problem occurs intermittently. The pain is at a severity of 4/10. Associated symptoms include diarrhea and nausea. Pertinent negatives include no anorexia, arthralgias, belching, constipation, dysuria, fever, flatus, frequency, headaches, hematochezia, hematuria, melena, myalgias, vomiting or weight loss.  Diarrhea  This is a new problem. The current episode started 1 to 4 weeks ago. The problem occurs less than 2 times per day. The problem has been waxing and waning. Associated symptoms include abdominal pain and a URI. Pertinent negatives include no arthralgias, bloating, chills, coughing, fever, headaches, increased  flatus, myalgias, sweats, vomiting or weight loss. Associated symptoms comments: Pt is on chemo   Feels like he has normal allergies, son has had URI's sec to school. Had an inhaler for asthma .   Chief Complaint  Patient presents with   Abdominal Pain    Has had diarrhea for past few weeks, has stopped magnesium as of Monday    nausea    Since yesterday     Relevant past medical, surgical, family and social history reviewed and updated as indicated. Interim medical history since our last visit reviewed. Allergies and medications reviewed and updated.  Review of Systems  Constitutional:  Negative for chills, fever and weight loss.  Respiratory:  Negative for cough.   Gastrointestinal:   Positive for abdominal pain, diarrhea and nausea. Negative for anorexia, bloating, constipation, flatus, hematochezia, melena and vomiting.  Genitourinary:  Negative for dysuria, frequency and hematuria.  Musculoskeletal:  Negative for arthralgias and myalgias.  Neurological:  Negative for headaches.   Per HPI unless specifically indicated above     Objective:    BP 114/80   Pulse 81   Temp 98.3 F (36.8 C) (Oral)   Ht 5' 7.99" (1.727 m)   Wt 142 lb 3.2 oz (64.5 kg)   SpO2 98%   BMI 21.63 kg/m   Wt Readings from Last 3 Encounters:  09/16/21 142 lb 3.2 oz (64.5 kg)  08/17/21 130 lb (59 kg)  07/05/21 130 lb (59 kg)    Physical Exam Vitals and nursing note reviewed.  Constitutional:      General: He is not in acute distress.    Appearance: Normal appearance. He is not ill-appearing or diaphoretic.  HENT:     Right Ear: Tympanic membrane and external ear normal. There is no impacted cerumen.     Left Ear: External ear normal.     Nose: No congestion or rhinorrhea.     Mouth/Throat:     Pharynx: No posterior oropharyngeal erythema.  Eyes:     Conjunctiva/sclera: Conjunctivae normal.  Cardiovascular:     Rate and Rhythm: Normal rate and regular rhythm.     Heart sounds:  No friction rub. No gallop.  Pulmonary:     Effort: No respiratory distress.     Breath sounds: No stridor. No wheezing or rhonchi.  Abdominal:     General: Abdomen is flat. Bowel sounds are normal. There is no distension.     Palpations: Abdomen is soft. There is no mass.     Tenderness: There is no abdominal tenderness.  Musculoskeletal:     Cervical back: Normal range of motion and neck supple. No rigidity or tenderness.     Left lower leg: No edema.  Skin:    General: Skin is warm and dry.  Neurological:     Mental Status: He is alert.   Results for orders placed or performed in visit on 09/16/21  Novel Coronavirus, NAA ([facility])   Specimen: Nasopharyngeal(NP) swabs in vial transport medium   Result Value Ref Range   SARS-CoV-2, NAA Detected (A) Not Detected  SARS-COV-2, NAA 2 DAY TAT  Result Value Ref Range   SARS-CoV-2, NAA 2 DAY TAT Performed   Veritor Flu A/B Waived  Result Value Ref Range   Influenza A Negative Negative   Influenza B Negative Negative        Current Outpatient Medications:    cloBAZam (ONFI) 10 MG tablet, Take 1 tablet by mouth 2 (two) times daily., Disp: , Rfl:    cyanocobalamin 1000 MCG tablet, Take by mouth., Disp: , Rfl:    divalproex (DEPAKOTE) 250 MG DR tablet, Take 5 tablets by mouth 2 (two) times daily., Disp: , Rfl:    GLEOSTINE 100 MG capsule, Take by mouth., Disp: , Rfl:    lactobacillus acidophilus & bulgar (LACTINEX) chewable tablet, Chew 1 tablet by mouth 3 (three) times daily with meals., Disp: 30 tablet, Rfl: 1   levETIRAcetam (KEPPRA) 1000 MG tablet, Take 2,000 mg by mouth 2 (two) times daily., Disp: , Rfl:    lomustine (CEENU) 40 MG capsule, Every 6 weeks., Disp: , Rfl:    magnesium oxide (MAG-OX) 400 MG tablet, TAKE 1 TABLET BY MOUTH TWICE A DAY, Disp: 180 tablet, Rfl: 1   melatonin 3 MG TABS tablet, Take 1 tablet by mouth daily as needed., Disp: , Rfl:    Multiple Vitamin (QUINTABS) TABS, Take 1 tablet by mouth daily., Disp: , Rfl:    ondansetron (ZOFRAN) 4 MG tablet, Take 4 mg by mouth every 8 (eight) hours as needed., Disp: , Rfl:    pantoprazole (PROTONIX) 40 MG tablet, Take 1 tablet (40 mg total) by mouth daily., Disp: 30 tablet, Rfl: 6   Potassium Chloride ER 20 MEQ TBCR, TAKE 2 TABLETS (40 MEQ TOTAL) BY MOUTH 2 (TWO) TIMES DAILY WITH MEALS FOR 30 DAYS (Patient taking differently: Take 20 mEq by mouth daily.), Disp: 540 tablet, Rfl: 2   sodium chloride 1 g tablet, Take 1 g by mouth 2 (two) times daily with a meal., Disp: , Rfl:     Assessment & Plan:  Diarrhea:  Acute Diarrhea ? Sec to viral Gastroenteritis will need to check for COVID and FLU . Will need to use probiotics after every loose stool Pt advised a bland BRAT  diet today.  Advised to call the office or go to the ER if she develops further abdominal pain crampign, any new onset of bleeding / black stools or  fresh red blood from any orifice,.  Pt verbalized understanding of such.     Problem List Items Addressed This Visit   None Visit Diagnoses     Diarrhea, unspecified  type    -  Primary   Relevant Medications   lactobacillus acidophilus & bulgar (LACTINEX) chewable tablet   Other Relevant Orders   Comprehensive metabolic panel   Novel Coronavirus, NAA ([facility]) (Completed)   Veritor Flu A/B Waived (Completed)   SARS-COV-2, NAA 2 DAY TAT (Completed)   Nausea       Relevant Medications   lactobacillus acidophilus & bulgar (LACTINEX) chewable tablet   Other Relevant Orders   Comprehensive metabolic panel   Novel Coronavirus, NAA ([facility]) (Completed)   Veritor Flu A/B Waived (Completed)   SARS-COV-2, NAA 2 DAY TAT (Completed)        Orders Placed This Encounter  Procedures   Novel Coronavirus, NAA ([facility])   SARS-COV-2, NAA 2 DAY TAT   Comprehensive metabolic panel   Veritor Flu A/B Waived     Meds ordered this encounter  Medications   lactobacillus acidophilus & bulgar (LACTINEX) chewable tablet    Sig: Chew 1 tablet by mouth 3 (three) times daily with meals.    Dispense:  30 tablet    Refill:  1     Follow up plan: No follow-ups on file.

## 2021-09-17 ENCOUNTER — Telehealth: Payer: Self-pay | Admitting: Internal Medicine

## 2021-09-17 LAB — CBC WITH DIFFERENTIAL/PLATELET
Basophils Absolute: 0 10*3/uL (ref 0.0–0.2)
Basos: 0 %
EOS (ABSOLUTE): 0.1 10*3/uL (ref 0.0–0.4)
Eos: 2 %
Hematocrit: 35.2 % — ABNORMAL LOW (ref 37.5–51.0)
Hemoglobin: 12.3 g/dL — ABNORMAL LOW (ref 13.0–17.7)
Immature Grans (Abs): 0 10*3/uL (ref 0.0–0.1)
Immature Granulocytes: 0 %
Lymphocytes Absolute: 1 10*3/uL (ref 0.7–3.1)
Lymphs: 36 %
MCH: 37.4 pg — ABNORMAL HIGH (ref 26.6–33.0)
MCHC: 34.9 g/dL (ref 31.5–35.7)
MCV: 107 fL — ABNORMAL HIGH (ref 79–97)
Monocytes Absolute: 0.4 10*3/uL (ref 0.1–0.9)
Monocytes: 13 %
Neutrophils Absolute: 1.4 10*3/uL (ref 1.4–7.0)
Neutrophils: 49 %
Platelets: 124 10*3/uL — ABNORMAL LOW (ref 150–450)
RBC: 3.29 x10E6/uL — ABNORMAL LOW (ref 4.14–5.80)
RDW: 13.7 % (ref 11.6–15.4)
WBC: 2.8 10*3/uL — ABNORMAL LOW (ref 3.4–10.8)

## 2021-09-17 LAB — BASIC METABOLIC PANEL
BUN/Creatinine Ratio: 11 (ref 9–20)
BUN: 10 mg/dL (ref 6–24)
CO2: 21 mmol/L (ref 20–29)
Calcium: 8.7 mg/dL (ref 8.7–10.2)
Chloride: 94 mmol/L — ABNORMAL LOW (ref 96–106)
Creatinine, Ser: 0.95 mg/dL (ref 0.76–1.27)
Glucose: 91 mg/dL (ref 70–99)
Potassium: 4.6 mmol/L (ref 3.5–5.2)
Sodium: 126 mmol/L — ABNORMAL LOW (ref 134–144)
eGFR: 101 mL/min/{1.73_m2} (ref >59)

## 2021-09-17 NOTE — Telephone Encounter (Signed)
Copied from [place] [id number]. Topic: General - Other >> Sep 17, 2021  1:07 PM Valere Dross wrote:  [place] Oncology team looked at yesterday labs and that his sodium levels have dropped and wants PCP to increase his  sodium chloride 1 g tablet medication to 2 to 3 times a day so they can watch his levels to prepare for his 6 months of chemo. She also stated that he needs to get those labs order so they can be checked again, please advise. Pt does have appt 11/07.

## 2021-09-17 NOTE — Telephone Encounter (Signed)
Routing to provider to advise. Patient has appointment 09/20/21

## 2021-09-18 LAB — NOVEL CORONAVIRUS, NAA: SARS-CoV-2, NAA: DETECTED — AB

## 2021-09-18 LAB — SARS-COV-2, NAA 2 DAY TAT

## 2021-09-19 LAB — STOOL CULTURE: E coli, Shiga toxin Assay: NEGATIVE

## 2021-09-20 ENCOUNTER — Ambulatory Visit: Payer: Medicare Other | Admitting: Internal Medicine

## 2021-09-20 LAB — OVA AND PARASITE EXAMINATION

## 2021-09-20 NOTE — Progress Notes (Signed)
Pt is positive for COVID d/w his POA ie  his mom  Increase fluid intake.  To use tyelnol every 4-6 hrs prn and alternate this with ibubrufen 800 mg q 8 hrly. Sinus pressure: use steam inhalation.  OTC - Allegra / claritin.  5 days quarantine. Ok to rtw in 5 days if tests -ve follow  Pts mom  verbalized understanding of such Will check herself for COVID too as she Is the pts caregiver, her grandson was +Ve for symptoms last week and tested -ve for COVID.  Advcied to d/w their PCP regarding the above.

## 2021-09-20 NOTE — Progress Notes (Signed)
Please let pt know this was normal.

## 2021-09-29 DIAGNOSIS — R569 Unspecified convulsions: Secondary | ICD-10-CM | POA: Insufficient documentation

## 2021-09-30 ENCOUNTER — Other Ambulatory Visit: Payer: Self-pay

## 2021-09-30 ENCOUNTER — Other Ambulatory Visit (INDEPENDENT_AMBULATORY_CARE_PROVIDER_SITE_OTHER): Payer: Medicare Other

## 2021-09-30 DIAGNOSIS — C719 Malignant neoplasm of brain, unspecified: Secondary | ICD-10-CM

## 2021-09-30 DIAGNOSIS — Z79899 Other long term (current) drug therapy: Secondary | ICD-10-CM

## 2021-09-30 DIAGNOSIS — Z789 Other specified health status: Secondary | ICD-10-CM

## 2021-10-01 LAB — CBC WITH DIFFERENTIAL/PLATELET
Basophils Absolute: 0 10*3/uL (ref 0.0–0.2)
Basos: 0 %
EOS (ABSOLUTE): 0.1 10*3/uL (ref 0.0–0.4)
Eos: 5 %
Hematocrit: 34.7 % — ABNORMAL LOW (ref 37.5–51.0)
Hemoglobin: 12.1 g/dL — ABNORMAL LOW (ref 13.0–17.7)
Immature Grans (Abs): 0 10*3/uL (ref 0.0–0.1)
Immature Granulocytes: 0 %
Lymphocytes Absolute: 1.1 10*3/uL (ref 0.7–3.1)
Lymphs: 40 %
MCH: 36.9 pg — ABNORMAL HIGH (ref 26.6–33.0)
MCHC: 34.9 g/dL (ref 31.5–35.7)
MCV: 106 fL — ABNORMAL HIGH (ref 79–97)
Monocytes Absolute: 0.3 10*3/uL (ref 0.1–0.9)
Monocytes: 10 %
Neutrophils Absolute: 1.2 10*3/uL — ABNORMAL LOW (ref 1.4–7.0)
Neutrophils: 45 %
Platelets: 129 10*3/uL — ABNORMAL LOW (ref 150–450)
RBC: 3.28 x10E6/uL — ABNORMAL LOW (ref 4.14–5.80)
RDW: 13.7 % (ref 11.6–15.4)
WBC: 2.7 10*3/uL — ABNORMAL LOW (ref 3.4–10.8)

## 2021-10-01 LAB — BASIC METABOLIC PANEL
BUN/Creatinine Ratio: 13 (ref 9–20)
BUN: 10 mg/dL (ref 6–24)
CO2: 22 mmol/L (ref 20–29)
Calcium: 8.6 mg/dL — ABNORMAL LOW (ref 8.7–10.2)
Chloride: 96 mmol/L (ref 96–106)
Creatinine, Ser: 0.78 mg/dL (ref 0.76–1.27)
Glucose: 87 mg/dL (ref 70–99)
Potassium: 4.6 mmol/L (ref 3.5–5.2)
Sodium: 131 mmol/L — ABNORMAL LOW (ref 134–144)
eGFR: 113 mL/min/{1.73_m2} (ref >59)

## 2021-10-04 ENCOUNTER — Telehealth: Payer: Self-pay | Admitting: Internal Medicine

## 2021-10-04 NOTE — Telephone Encounter (Signed)
Routing to provider for lab results as I do not see a result note.    Also routing to [place] for billing/coding issue.

## 2021-10-04 NOTE — Telephone Encounter (Signed)
Copied from [place] [id number]. Topic: General - Other >> Oct 04, 2021 11:25 AM Alanda Slim E wrote: Reason for CRM: Pt was made aware of a claim being denied($495) by Medicare for DOS 3.10.22/ pts mom spoke with insurance and they advised the appt was coded wrong and needed a different procedure code/ it was put in as a routine visit / please advise  Pts mom also would like to know pts labs results today especially the sodium level /

## 2021-10-05 NOTE — Telephone Encounter (Signed)
Sodium is better at [address] let pt know. Thnx.

## 2021-10-05 NOTE — Telephone Encounter (Signed)
Patient's mother notified of result.

## 2021-10-14 ENCOUNTER — Other Ambulatory Visit: Payer: Self-pay

## 2021-10-14 ENCOUNTER — Other Ambulatory Visit: Payer: Medicare Other

## 2021-10-14 DIAGNOSIS — Z789 Other specified health status: Secondary | ICD-10-CM

## 2021-10-14 DIAGNOSIS — C719 Malignant neoplasm of brain, unspecified: Secondary | ICD-10-CM

## 2021-10-14 DIAGNOSIS — R110 Nausea: Secondary | ICD-10-CM

## 2021-10-14 DIAGNOSIS — Z79899 Other long term (current) drug therapy: Secondary | ICD-10-CM

## 2021-10-14 DIAGNOSIS — R197 Diarrhea, unspecified: Secondary | ICD-10-CM

## 2021-10-14 MED ORDER — LACTINEX PO CHEW: 1.0000 | CHEWABLE_TABLET | Freq: Three times a day (TID) | ORAL | 1 refills | Status: DC

## 2021-10-14 NOTE — Telephone Encounter (Signed)
[facility] requesting patient return my call directly at [phone] to discuss billing concern, Reviewed coding for DOS 01/21/21. Will send request to billing department to reprocess claim.

## 2021-10-15 LAB — CBC WITH DIFFERENTIAL/PLATELET
Basophils Absolute: 0 10*3/uL (ref 0.0–0.2)
Basos: 0 %
EOS (ABSOLUTE): 0.1 10*3/uL (ref 0.0–0.4)
Eos: 2 %
Hematocrit: 34.6 % — ABNORMAL LOW (ref 37.5–51.0)
Hemoglobin: 12.5 g/dL — ABNORMAL LOW (ref 13.0–17.7)
Immature Grans (Abs): 0 10*3/uL (ref 0.0–0.1)
Immature Granulocytes: 0 %
Lymphocytes Absolute: 1.1 10*3/uL (ref 0.7–3.1)
Lymphs: 42 %
MCH: 38.5 pg — ABNORMAL HIGH (ref 26.6–33.0)
MCHC: 36.1 g/dL — ABNORMAL HIGH (ref 31.5–35.7)
MCV: 107 fL — ABNORMAL HIGH (ref 79–97)
Monocytes Absolute: 0.2 10*3/uL (ref 0.1–0.9)
Monocytes: 9 %
Neutrophils Absolute: 1.2 10*3/uL — ABNORMAL LOW (ref 1.4–7.0)
Neutrophils: 47 %
Platelets: 174 10*3/uL (ref 150–450)
RBC: 3.25 x10E6/uL — ABNORMAL LOW (ref 4.14–5.80)
RDW: 13.3 % (ref 11.6–15.4)
WBC: 2.6 10*3/uL — ABNORMAL LOW (ref 3.4–10.8)

## 2021-10-15 LAB — BASIC METABOLIC PANEL
BUN/Creatinine Ratio: 15 (ref 9–20)
BUN: 13 mg/dL (ref 6–24)
CO2: 20 mmol/L (ref 20–29)
Calcium: 8.9 mg/dL (ref 8.7–10.2)
Chloride: 98 mmol/L (ref 96–106)
Creatinine, Ser: 0.87 mg/dL (ref 0.76–1.27)
Glucose: 85 mg/dL (ref 70–99)
Potassium: 4.7 mmol/L (ref 3.5–5.2)
Sodium: 132 mmol/L — ABNORMAL LOW (ref 134–144)
eGFR: 109 mL/min/{1.73_m2} (ref >59)

## 2021-10-25 ENCOUNTER — Other Ambulatory Visit: Payer: Self-pay

## 2021-10-25 ENCOUNTER — Other Ambulatory Visit: Payer: Medicare Other

## 2021-10-25 DIAGNOSIS — C719 Malignant neoplasm of brain, unspecified: Secondary | ICD-10-CM

## 2021-10-25 DIAGNOSIS — Z79899 Other long term (current) drug therapy: Secondary | ICD-10-CM

## 2021-10-25 DIAGNOSIS — Z789 Other specified health status: Secondary | ICD-10-CM

## 2021-10-26 LAB — CBC WITH DIFFERENTIAL/PLATELET
Basophils Absolute: 0 10*3/uL (ref 0.0–0.2)
Basos: 1 %
EOS (ABSOLUTE): 0 10*3/uL (ref 0.0–0.4)
Eos: 2 %
Hematocrit: 35.1 % — ABNORMAL LOW (ref 37.5–51.0)
Hemoglobin: 12.2 g/dL — ABNORMAL LOW (ref 13.0–17.7)
Immature Grans (Abs): 0 10*3/uL (ref 0.0–0.1)
Immature Granulocytes: 1 %
Lymphocytes Absolute: 0.7 10*3/uL (ref 0.7–3.1)
Lymphs: 34 %
MCH: 37.3 pg — ABNORMAL HIGH (ref 26.6–33.0)
MCHC: 34.8 g/dL (ref 31.5–35.7)
MCV: 107 fL — ABNORMAL HIGH (ref 79–97)
Monocytes Absolute: 0.3 10*3/uL (ref 0.1–0.9)
Monocytes: 12 %
Neutrophils Absolute: 1.1 10*3/uL — ABNORMAL LOW (ref 1.4–7.0)
Neutrophils: 50 %
Platelets: 166 10*3/uL (ref 150–450)
RBC: 3.27 x10E6/uL — ABNORMAL LOW (ref 4.14–5.80)
RDW: 14.3 % (ref 11.6–15.4)
WBC: 2.1 10*3/uL — CL (ref 3.4–10.8)

## 2021-10-26 LAB — BASIC METABOLIC PANEL
BUN/Creatinine Ratio: 9 (ref 9–20)
BUN: 8 mg/dL (ref 6–24)
CO2: 22 mmol/L (ref 20–29)
Calcium: 8.8 mg/dL (ref 8.7–10.2)
Chloride: 97 mmol/L (ref 96–106)
Creatinine, Ser: 0.9 mg/dL (ref 0.76–1.27)
Glucose: 90 mg/dL (ref 70–99)
Potassium: 4.6 mmol/L (ref 3.5–5.2)
Sodium: 132 mmol/L — ABNORMAL LOW (ref 134–144)
eGFR: 108 mL/min/{1.73_m2} (ref >59)

## 2021-10-26 NOTE — Progress Notes (Signed)
WBC count - 2.1 -- pl let him know

## 2021-10-27 ENCOUNTER — Other Ambulatory Visit: Payer: Self-pay | Admitting: Nurse Practitioner

## 2021-10-27 NOTE — Telephone Encounter (Signed)
Could you pl see why hes on this med jolene rx this last not sure why thnx

## 2021-10-27 NOTE — Telephone Encounter (Signed)
Requested medications are due for refill today.  A bit too soon  Requested medications are on the active medications list.  yes  Last refill. 06/28/2021  Future visit scheduled.   yes  Notes to clinic.  Failed protocol d/t missing labs.    Requested Prescriptions  Pending Prescriptions Disp Refills   magnesium oxide (MAG-OX) 400 (240 Mg) MG tablet [Pharmacy Med Name: MAGNESIUM OXIDE 400 MG TABLET] 180 tablet 1    Sig: TAKE 1 TABLET BY MOUTH TWICE A DAY     Endocrinology:  [facility] - Magnesium Supplementation Failed - 10/27/2021  1:33 AM      Failed - Mg Level in normal range and within 360 days    No results found for: MG        Passed - Valid encounter within last 12 months    Recent Outpatient Visits           1 month ago Diarrhea, unspecified type   [facility] Vigg, Avanti, MD   2 months ago Hyponatremia   [facility] Vigg, Avanti, MD   3 months ago 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[place])   [facility] Vigg, Avanti, MD   5 months ago 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[place])   [facility] Vigg, Avanti, MD   5 months ago Wound discharge   [facility], [place]       Future Appointments             In 3 weeks Vigg, Avanti, MD [facility]   In 8 months  [employer], [place]

## 2021-11-18 ENCOUNTER — Other Ambulatory Visit: Payer: Self-pay

## 2021-11-18 ENCOUNTER — Ambulatory Visit: Payer: Medicare Other | Admitting: Internal Medicine

## 2021-11-18 ENCOUNTER — Encounter: Payer: Self-pay | Admitting: Internal Medicine

## 2021-11-18 ENCOUNTER — Ambulatory Visit (INDEPENDENT_AMBULATORY_CARE_PROVIDER_SITE_OTHER): Payer: Medicare (Managed Care) | Admitting: Internal Medicine

## 2021-11-18 DIAGNOSIS — C719 Malignant neoplasm of brain, unspecified: Secondary | ICD-10-CM | POA: Diagnosis not present

## 2021-11-18 DIAGNOSIS — Z79899 Other long term (current) drug therapy: Secondary | ICD-10-CM | POA: Diagnosis not present

## 2021-11-18 DIAGNOSIS — Z789 Other specified health status: Secondary | ICD-10-CM

## 2021-11-18 DIAGNOSIS — R110 Nausea: Secondary | ICD-10-CM | POA: Diagnosis not present

## 2021-11-18 DIAGNOSIS — R197 Diarrhea, unspecified: Secondary | ICD-10-CM | POA: Insufficient documentation

## 2021-11-18 MED ORDER — LACTINEX PO CHEW: 1.0000 | CHEWABLE_TABLET | ORAL | 1 refills | Status: DC | PRN

## 2021-11-18 NOTE — Progress Notes (Signed)
BP 107/76    Pulse 86    Temp 98 F (36.7 C) (Oral)    Ht 5' 8.11" (1.73 m)    Wt 138 lb 3.2 oz (62.7 kg)    SpO2 98%    BMI 20.95 kg/m    Subjective:    Patient ID: Cory Hayes, male    DOB: 1975-12-26, 46 y.o.   MRN: [id number]  Chief Complaint  Patient presents with   Astrocytoma    HPI: Cory Hayes is a 46 y.o. male  Pt is here for a follow up. He has had chemo and doesn't feel well after the last round - WBC count was 2.1  Feels like he has some lack of sleep, naps during the day.  Diarrhea  This is a recurrent (has been to bathroom withdiarrhea x 2) problem. The current episode started today. Pertinent negatives include no abdominal pain, arthralgias, bloating, chills, coughing, fever, headaches, increased  flatus, myalgias, sweats, URI or vomiting.   Chief Complaint  Patient presents with   Astrocytoma    Relevant past medical, surgical, family and social history reviewed and updated as indicated. Interim medical history since our last visit reviewed. Allergies and medications reviewed and updated.  Review of Systems  Constitutional:  Negative for chills and fever.  Respiratory:  Negative for cough.   Gastrointestinal:  Positive for diarrhea. Negative for abdominal pain, bloating, flatus and vomiting.  Musculoskeletal:  Negative for arthralgias and myalgias.  Neurological:  Negative for headaches.   Per HPI unless specifically indicated above     Objective:    BP 107/76    Pulse 86    Temp 98 F (36.7 C) (Oral)    Ht 5' 8.11" (1.73 m)    Wt 138 lb 3.2 oz (62.7 kg)    SpO2 98%    BMI 20.95 kg/m   Wt Readings from Last 3 Encounters:  11/18/21 138 lb 3.2 oz (62.7 kg)  09/16/21 142 lb 3.2 oz (64.5 kg)  08/17/21 130 lb (59 kg)    Physical Exam Vitals and nursing note reviewed.  Constitutional:      General: He is not in acute distress.    Appearance: Normal appearance. He is not ill-appearing or diaphoretic.  HENT:     Head: Normocephalic and  atraumatic.     Right Ear: Tympanic membrane and external ear normal. There is no impacted cerumen.     Left Ear: External ear normal.     Nose: No congestion or rhinorrhea.     Mouth/Throat:     Pharynx: No oropharyngeal exudate or posterior oropharyngeal erythema.  Eyes:     Conjunctiva/sclera: Conjunctivae normal.     Pupils: Pupils are equal, round, and reactive to light.  Cardiovascular:     Rate and Rhythm: Normal rate and regular rhythm.     Heart sounds: No murmur heard.   No friction rub. No gallop.  Pulmonary:     Effort: No respiratory distress.     Breath sounds: No stridor.  Musculoskeletal:     Cervical back: Normal range of motion and neck supple. No rigidity or tenderness.     Left lower leg: No edema.  Skin:    General: Skin is warm and dry.  Neurological:     Mental Status: He is alert.     Motor: Weakness present.     Coordination: Coordination abnormal.     Gait: Gait abnormal.  Psychiatric:        Mood and Affect: Mood  normal.        Behavior: Behavior normal.    Results for orders placed or performed in visit on 55/97/41  Basic Metabolic Panel (BMET)  Result Value Ref Range   Glucose 90 70 - 99 mg/dL   BUN 8 6 - 24 mg/dL   Creatinine, Ser 0.90 0.76 - 1.27 mg/dL   eGFR 108 >59 mL/min/1.73   BUN/Creatinine Ratio 9 9 - 20   Sodium 132 (L) 134 - 144 mmol/L   Potassium 4.6 3.5 - 5.2 mmol/L   Chloride 97 96 - 106 mmol/L   CO2 22 20 - 29 mmol/L   Calcium 8.8 8.7 - 10.2 mg/dL  CBC with Differential/Platelet  Result Value Ref Range   WBC 2.1 (LL) 3.4 - 10.8 x10E3/uL   RBC 3.27 (L) 4.14 - 5.80 x10E6/uL   Hemoglobin 12.2 (L) 13.0 - 17.7 g/dL   Hematocrit 35.1 (L) 37.5 - 51.0 %   MCV 107 (H) 79 - 97 fL   MCH 37.3 (H) 26.6 - 33.0 pg   MCHC 34.8 31.5 - 35.7 g/dL   RDW 14.3 11.6 - 15.4 %   Platelets 166 150 - 450 x10E3/uL   Neutrophils 50 Not Estab. %   Lymphs 34 Not Estab. %   Monocytes 12 Not Estab. %   Eos 2 Not Estab. %   Basos 1 Not Estab. %    Neutrophils Absolute 1.1 (L) 1.4 - 7.0 x10E3/uL   Lymphocytes Absolute 0.7 0.7 - 3.1 x10E3/uL   Monocytes Absolute 0.3 0.1 - 0.9 x10E3/uL   EOS (ABSOLUTE) 0.0 0.0 - 0.4 x10E3/uL   Basophils Absolute 0.0 0.0 - 0.2 x10E3/uL   Immature Granulocytes 1 Not Estab. %   Immature Grans (Abs) 0.0 0.0 - 0.1 x10E3/uL        Current Outpatient Medications:    cloBAZam (ONFI) 10 MG tablet, Take 1 tablet by mouth 2 (two) times daily., Disp: , Rfl:    cyanocobalamin 1000 MCG tablet, Take by mouth., Disp: , Rfl:    divalproex (DEPAKOTE) 250 MG DR tablet, Take 5 tablets by mouth 2 (two) times daily., Disp: , Rfl:    GLEOSTINE 100 MG capsule, Take by mouth., Disp: , Rfl:    lactobacillus acidophilus & bulgar (LACTINEX) chewable tablet, Chew 1 tablet by mouth 3 (three) times daily with meals., Disp: 30 tablet, Rfl: 1   levETIRAcetam (KEPPRA) 1000 MG tablet, Take 2,000 mg by mouth 2 (two) times daily., Disp: , Rfl:    lomustine (CEENU) 40 MG capsule, Every 6 weeks., Disp: , Rfl:    magnesium oxide (MAG-OX) 400 MG tablet, TAKE 1 TABLET BY MOUTH TWICE A DAY, Disp: 180 tablet, Rfl: 1   melatonin 3 MG TABS tablet, Take 1 tablet by mouth daily as needed., Disp: , Rfl:    Multiple Vitamin (QUINTABS) TABS, Take 1 tablet by mouth daily., Disp: , Rfl:    ondansetron (ZOFRAN) 4 MG tablet, Take 4 mg by mouth every 8 (eight) hours as needed., Disp: , Rfl:    pantoprazole (PROTONIX) 40 MG tablet, Take 1 tablet (40 mg total) by mouth daily., Disp: 30 tablet, Rfl: 6   Potassium Chloride ER 20 MEQ TBCR, TAKE 2 TABLETS (40 MEQ TOTAL) BY MOUTH 2 (TWO) TIMES DAILY WITH MEALS FOR 30 DAYS (Patient taking differently: Take 20 mEq by mouth daily.), Disp: 540 tablet, Rfl: 2   sodium chloride 1 g tablet, Take 1 g by mouth 2 (two) times daily with a meal., Disp: , Rfl:  Assessment & Plan:   Assessment & Plan:  Hyponatremia : is on sodium chloride 1gm tid of such Hyponatremia Na 132 stable To fu with nephrology ?  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 Will find out next week. To have a repeat MRI scan for such  Stable fu with oncology for such  Standing orders in for CBC/ BMP for fu on the above.   3.Diarrhea recurrent :  Will start pt on probiotics  Problem List Items Addressed This Visit   None        Follow up plan: No follow-ups on file.

## 2021-11-19 ENCOUNTER — Telehealth: Payer: Self-pay | Admitting: Internal Medicine

## 2021-11-19 ENCOUNTER — Telehealth: Payer: Self-pay

## 2021-11-19 LAB — BASIC METABOLIC PANEL
BUN/Creatinine Ratio: 15 (ref 9–20)
BUN: 11 mg/dL (ref 6–24)
CO2: 22 mmol/L (ref 20–29)
Calcium: 8.6 mg/dL — ABNORMAL LOW (ref 8.7–10.2)
Chloride: 97 mmol/L (ref 96–106)
Creatinine, Ser: 0.74 mg/dL — ABNORMAL LOW (ref 0.76–1.27)
Glucose: 91 mg/dL (ref 70–99)
Potassium: 4.7 mmol/L (ref 3.5–5.2)
Sodium: 129 mmol/L — ABNORMAL LOW (ref 134–144)
eGFR: 115 mL/min/{1.73_m2} (ref >59)

## 2021-11-19 LAB — CBC WITH DIFFERENTIAL/PLATELET
Basophils Absolute: 0 10*3/uL (ref 0.0–0.2)
Basos: 0 %
EOS (ABSOLUTE): 0.1 10*3/uL (ref 0.0–0.4)
Eos: 3 %
Hematocrit: 32.9 % — ABNORMAL LOW (ref 37.5–51.0)
Hemoglobin: 12.4 g/dL — ABNORMAL LOW (ref 13.0–17.7)
Lymphocytes Absolute: 0.6 10*3/uL — ABNORMAL LOW (ref 0.7–3.1)
Lymphs: 15 %
MCH: 40.9 pg — ABNORMAL HIGH (ref 26.6–33.0)
MCHC: 37.7 g/dL — ABNORMAL HIGH (ref 31.5–35.7)
MCV: 109 fL — ABNORMAL HIGH (ref 79–97)
Monocytes Absolute: 0.2 10*3/uL (ref 0.1–0.9)
Monocytes: 5 %
Neutrophils Absolute: 3.2 10*3/uL (ref 1.4–7.0)
Neutrophils: 77 %
Platelets: 108 10*3/uL — ABNORMAL LOW (ref 150–450)
RBC: 3.03 x10E6/uL — ABNORMAL LOW (ref 4.14–5.80)
RDW: 14 % (ref 11.6–15.4)
WBC: 4.2 10*3/uL (ref 3.4–10.8)

## 2021-11-19 LAB — HEPATIC FUNCTION PANEL
ALT: 12 IU/L (ref 0–44)
AST: 15 IU/L (ref 0–40)
Albumin: 3.6 g/dL — ABNORMAL LOW (ref 4.0–5.0)
Alkaline Phosphatase: 59 IU/L (ref 44–121)
Bilirubin Total: 0.6 mg/dL (ref 0.0–1.2)
Bilirubin, Direct: 0.2 mg/dL (ref 0.00–0.40)
Total Protein: 6.2 g/dL (ref 6.0–8.5)

## 2021-11-19 LAB — TSH: TSH: 1.16 u[IU]/mL (ref 0.450–4.500)

## 2021-11-19 NOTE — Telephone Encounter (Signed)
Returned patients call, set up appt on Monday Jan 9 at 1:20pm for repeat labs, (CBC and CMP)

## 2021-11-19 NOTE — Telephone Encounter (Signed)
Copied from [place] [id number]. Topic: General - Other >> Nov 19, 2021 11:04 AM Yvette Rack wrote: Reason for CRM: Pt mother Jacqlyn Larsen requests return call from [place]. Cb# [phone]

## 2021-11-19 NOTE — Progress Notes (Signed)
Please let pt know this was normal.

## 2021-11-19 NOTE — Telephone Encounter (Signed)
Returned patient's mother's call. She said that the [facility] said to decrease his drinking. Mother will call [facility] about sodium as well

## 2021-11-19 NOTE — Telephone Encounter (Signed)
Caller name: Berland-Bieschke, Becky Relation to pt: mother  Call back number: [phone]   Reason for call:  Caller states patient recent  WBC is low. Caller reached out to [facility] oncology and they are requesting patient repeat CBC, CMP on Monday or Tuesday prior to patient Oncology appointment scheduled for Wednesday. Specialist advised caller to contact PCP to order labs. Caller would like a follow up call today

## 2021-11-22 ENCOUNTER — Other Ambulatory Visit: Payer: Medicare (Managed Care)

## 2021-11-22 ENCOUNTER — Other Ambulatory Visit: Payer: Self-pay

## 2021-11-22 DIAGNOSIS — C719 Malignant neoplasm of brain, unspecified: Secondary | ICD-10-CM

## 2021-11-22 DIAGNOSIS — Z79899 Other long term (current) drug therapy: Secondary | ICD-10-CM | POA: Diagnosis not present

## 2021-11-22 DIAGNOSIS — Z789 Other specified health status: Secondary | ICD-10-CM

## 2021-11-23 LAB — CBC WITH DIFFERENTIAL/PLATELET
Basophils Absolute: 0 10*3/uL (ref 0.0–0.2)
Basos: 0 %
EOS (ABSOLUTE): 0.1 10*3/uL (ref 0.0–0.4)
Eos: 5 %
Hematocrit: 33 % — ABNORMAL LOW (ref 37.5–51.0)
Hemoglobin: 12 g/dL — ABNORMAL LOW (ref 13.0–17.7)
Immature Grans (Abs): 0 10*3/uL (ref 0.0–0.1)
Immature Granulocytes: 0 %
Lymphocytes Absolute: 1.1 10*3/uL (ref 0.7–3.1)
Lymphs: 41 %
MCH: 39.9 pg — ABNORMAL HIGH (ref 26.6–33.0)
MCHC: 36.4 g/dL — ABNORMAL HIGH (ref 31.5–35.7)
MCV: 110 fL — ABNORMAL HIGH (ref 79–97)
Monocytes Absolute: 0.2 10*3/uL (ref 0.1–0.9)
Monocytes: 8 %
Neutrophils Absolute: 1.2 10*3/uL — ABNORMAL LOW (ref 1.4–7.0)
Neutrophils: 46 %
Platelets: 117 10*3/uL — ABNORMAL LOW (ref 150–450)
RBC: 3.01 x10E6/uL — ABNORMAL LOW (ref 4.14–5.80)
RDW: 14.2 % (ref 11.6–15.4)
WBC: 2.7 10*3/uL — ABNORMAL LOW (ref 3.4–10.8)

## 2021-11-23 LAB — COMP. METABOLIC PANEL (12)
AST: 15 IU/L (ref 0–40)
Albumin/Globulin Ratio: 1.3 (ref 1.2–2.2)
Albumin: 3.5 g/dL — ABNORMAL LOW (ref 4.0–5.0)
Alkaline Phosphatase: 61 IU/L (ref 44–121)
BUN/Creatinine Ratio: 13 (ref 9–20)
BUN: 12 mg/dL (ref 6–24)
Bilirubin Total: 0.5 mg/dL (ref 0.0–1.2)
Calcium: 8.7 mg/dL (ref 8.7–10.2)
Chloride: 97 mmol/L (ref 96–106)
Creatinine, Ser: 0.93 mg/dL (ref 0.76–1.27)
Globulin, Total: 2.7 g/dL (ref 1.5–4.5)
Glucose: 102 mg/dL — ABNORMAL HIGH (ref 70–99)
Potassium: 4.6 mmol/L (ref 3.5–5.2)
Sodium: 130 mmol/L — ABNORMAL LOW (ref 134–144)
Total Protein: 6.2 g/dL (ref 6.0–8.5)
eGFR: 104 mL/min/{1.73_m2} (ref >59)

## 2021-11-24 DIAGNOSIS — C719 Malignant neoplasm of brain, unspecified: Secondary | ICD-10-CM | POA: Diagnosis not present

## 2021-11-24 DIAGNOSIS — C713 Malignant neoplasm of parietal lobe: Secondary | ICD-10-CM | POA: Diagnosis not present

## 2021-11-26 ENCOUNTER — Telehealth: Payer: Self-pay

## 2021-11-26 NOTE — Telephone Encounter (Signed)
Copied from [place] [id number]. Topic: General - Other >> Nov 26, 2021  2:23 PM Valere Dross wrote: Reason for CRM: Pt called in requesting a call back from PCP nurse or Tammy, at there soonest, please advise.

## 2021-11-26 NOTE — Telephone Encounter (Signed)
Patient was seen at [facility] by his Oncology team wants patient to do another round of chemo and radiation due to scan. They found one spot that is concerning and want to be proactive and treat.  Mom was wanting to know if Dr. Neomia Dear would write letter that a emotional support dog would benefit patient. Then they would be able to get a dog for Patient.

## 2021-11-29 NOTE — Telephone Encounter (Signed)
Needs to d/w me at next appointment

## 2021-12-01 ENCOUNTER — Telehealth (INDEPENDENT_AMBULATORY_CARE_PROVIDER_SITE_OTHER): Payer: Medicare (Managed Care) | Admitting: Internal Medicine

## 2021-12-01 ENCOUNTER — Encounter: Payer: Self-pay | Admitting: Internal Medicine

## 2021-12-01 DIAGNOSIS — F32A Depression, unspecified: Secondary | ICD-10-CM | POA: Insufficient documentation

## 2021-12-01 DIAGNOSIS — F339 Major depressive disorder, recurrent, unspecified: Secondary | ICD-10-CM | POA: Diagnosis not present

## 2021-12-01 MED ORDER — CITALOPRAM HYDROBROMIDE 10 MG PO TABS: 10.0000 mg | ORAL_TABLET | Freq: Every day | ORAL | 1 refills | Status: DC

## 2021-12-01 MED ORDER — MIRTAZAPINE 15 MG PO TABS: 15.0000 mg | ORAL_TABLET | Freq: Every day | ORAL | 4 refills | Status: DC

## 2021-12-01 NOTE — Progress Notes (Addendum)
There were no vitals taken for this visit.   Subjective:    Patient ID: Cory Hayes, male    DOB: 06/19/1976, 46 y.o.   MRN: [id number]  No chief complaint on file.   This visit was completed via telephone due to the restrictions of the COVID-19 pandemic. All issues as above were discussed and addressed but no physical exam was performed. If it was felt that the patient should be evaluated in the office, they were directed there. The patient verbally consented to this visit. Patient was unable to complete an audio/visual visit due to Technical difficulties, Lack of internet. Due to the catastrophic nature of the COVID-19 pandemic, this visit was done through audio contact only.10 Location of the patient: home Location of the provider: work Those involved with this call:  Provider: Charlynne Cousins, MD CMA: Frazier Butt, [place] Desk/Registration: [employer]  Time spent on call:  10 minutes on the phone discussing health concerns. 10 minutes total spent in review of patient's record and preparation of their chart.  HPI: Cory Hayes is a 46 y.o. male  Needs a letter of recommendation for an emotional support animal which would be good for the family. Theres a security deposit to put in and its expensive.  The letter would waive the fees.  They have been watching their daughters dog and has been good for all three of them.  Per pts mom. Per pt - went to [facility] - and will need additional rounds of chemotherapy and possibly radiation. This news has really " deflated the pt's ballon"  Says he feels anxious and depressed.   Depression        This is a new problem.  Associated symptoms include decreased concentration, fatigue, hopelessness, insomnia, appetite change, body aches and sad.  Associated symptoms include no helplessness, not irritable, no restlessness, no decreased interest, no myalgias, no headaches, no indigestion and no suicidal ideas.( Sees himself detriorating eveyrthing  hurts when he gets out 14 yrs of fighting this cancer has made him worse.   S -- sleep , insomnia +    A --- appetite getting food in but not as he used to , stil has on and off diarrhea.   W --- stable.)  No chief complaint on file.   Relevant past medical, surgical, family and social history reviewed and updated as indicated. Interim medical history since our last visit reviewed. Allergies and medications reviewed and updated.  Review of Systems  Constitutional:  Positive for appetite change and fatigue.  Musculoskeletal:  Negative for myalgias.  Neurological:  Negative for headaches.  Psychiatric/Behavioral:  Positive for decreased concentration and depression. Negative for suicidal ideas. The patient has insomnia.    Per HPI unless specifically indicated above     Objective:    There were no vitals taken for this visit.  Wt Readings from Last 3 Encounters:  11/18/21 138 lb 3.2 oz (62.7 kg)  09/16/21 142 lb 3.2 oz (64.5 kg)  08/17/21 130 lb (59 kg)    Physical Exam Constitutional:      General: He is not irritable.   Results for orders placed or performed in visit on 11/22/21  Comp. Metabolic Panel (12)  Result Value Ref Range   Glucose 102 (H) 70 - 99 mg/dL   BUN 12 6 - 24 mg/dL   Creatinine, Ser 0.93 0.76 - 1.27 mg/dL   eGFR 104 >59 mL/min/1.73   BUN/Creatinine Ratio 13 9 - 20   Sodium 130 (L) 134 -  144 mmol/L   Potassium 4.6 3.5 - 5.2 mmol/L   Chloride 97 96 - 106 mmol/L   Calcium 8.7 8.7 - 10.2 mg/dL   Total Protein 6.2 6.0 - 8.5 g/dL   Albumin 3.5 (L) 4.0 - 5.0 g/dL   Globulin, Total 2.7 1.5 - 4.5 g/dL   Albumin/Globulin Ratio 1.3 1.2 - 2.2   Bilirubin Total 0.5 0.0 - 1.2 mg/dL   Alkaline Phosphatase 61 44 - 121 IU/L   AST 15 0 - 40 IU/L  CBC with Differential/Platelet  Result Value Ref Range   WBC 2.7 (L) 3.4 - 10.8 x10E3/uL   RBC 3.01 (L) 4.14 - 5.80 x10E6/uL   Hemoglobin 12.0 (L) 13.0 - 17.7 g/dL   Hematocrit 33.0 (L) 37.5 - 51.0 %   MCV 110 (H)  79 - 97 fL   MCH 39.9 (H) 26.6 - 33.0 pg   MCHC 36.4 (H) 31.5 - 35.7 g/dL   RDW 14.2 11.6 - 15.4 %   Platelets 117 (L) 150 - 450 x10E3/uL   Neutrophils 46 Not Estab. %   Lymphs 41 Not Estab. %   Monocytes 8 Not Estab. %   Eos 5 Not Estab. %   Basos 0 Not Estab. %   Neutrophils Absolute 1.2 (L) 1.4 - 7.0 x10E3/uL   Lymphocytes Absolute 1.1 0.7 - 3.1 x10E3/uL   Monocytes Absolute 0.2 0.1 - 0.9 x10E3/uL   EOS (ABSOLUTE) 0.1 0.0 - 0.4 x10E3/uL   Basophils Absolute 0.0 0.0 - 0.2 x10E3/uL   Immature Granulocytes 0 Not Estab. %   Immature Grans (Abs) 0.0 0.0 - 0.1 x10E3/uL        Current Outpatient Medications:    mirtazapine (REMERON) 15 MG tablet, Take 1 tablet (15 mg total) by mouth at bedtime., Disp: 30 tablet, Rfl: 4   cloBAZam (ONFI) 10 MG tablet, Take 1 tablet by mouth 2 (two) times daily., Disp: , Rfl:    cyanocobalamin 1000 MCG tablet, Take by mouth., Disp: , Rfl:    divalproex (DEPAKOTE) 250 MG DR tablet, Take 5 tablets by mouth 2 (two) times daily., Disp: , Rfl:    GLEOSTINE 100 MG capsule, Take by mouth., Disp: , Rfl:    lactobacillus acidophilus & bulgar (LACTINEX) chewable tablet, Chew 1 tablet by mouth 3 (three) times daily with meals., Disp: 30 tablet, Rfl: 1   lactobacillus acidophilus & bulgar (LACTINEX) chewable tablet, Chew 1 tablet by mouth as needed (take it after every stools)., Disp: 30 tablet, Rfl: 1   levETIRAcetam (KEPPRA) 1000 MG tablet, Take 2,000 mg by mouth 2 (two) times daily., Disp: , Rfl:    lomustine (CEENU) 40 MG capsule, Every 6 weeks., Disp: , Rfl:    melatonin 3 MG TABS tablet, Take 1 tablet by mouth daily as needed., Disp: , Rfl:    Multiple Vitamin (QUINTABS) TABS, Take 1 tablet by mouth daily., Disp: , Rfl:    ondansetron (ZOFRAN) 4 MG tablet, Take 4 mg by mouth every 8 (eight) hours as needed., Disp: , Rfl:    pantoprazole (PROTONIX) 40 MG tablet, Take 1 tablet (40 mg total) by mouth daily., Disp: 30 tablet, Rfl: 6   Potassium Chloride ER 20 MEQ  TBCR, TAKE 2 TABLETS (40 MEQ TOTAL) BY MOUTH 2 (TWO) TIMES DAILY WITH MEALS FOR 30 DAYS (Patient taking differently: Take 20 mEq by mouth daily.), Disp: 540 tablet, Rfl: 2   sodium chloride 1 g tablet, Take 1 g by mouth 2 (two) times daily with a meal., Disp: ,  Rfl:     Assessment & Plan:   Depression/ insomnia/ appetite loss Will start pt on remeron for all the above.  Fu with me x 3 weeks for a follow up virtually  Pt to have another round of chemo starting soon   Ok to provide letter for an emotional support animal for pt and his family.     Problem List Items Addressed This Visit       Other   Depression, recurrent ([place]) - Primary   Relevant Medications   mirtazapine (REMERON) 15 MG tablet     No orders of the defined types were placed in this encounter.    Meds ordered this encounter  Medications   DISCONTD: citalopram (CELEXA) 10 MG tablet    Sig: Take 1 tablet (10 mg total) by mouth daily.    Dispense:  30 tablet    Refill:  1   mirtazapine (REMERON) 15 MG tablet    Sig: Take 1 tablet (15 mg total) by mouth at bedtime.    Dispense:  30 tablet    Refill:  4     Follow up plan: Return in about 3 weeks (around 12/22/2021).

## 2021-12-02 ENCOUNTER — Other Ambulatory Visit: Payer: Medicare (Managed Care)

## 2021-12-06 ENCOUNTER — Other Ambulatory Visit: Payer: Medicare (Managed Care)

## 2021-12-07 ENCOUNTER — Other Ambulatory Visit: Payer: Medicare (Managed Care)

## 2021-12-07 ENCOUNTER — Other Ambulatory Visit: Payer: Self-pay

## 2021-12-07 DIAGNOSIS — Z789 Other specified health status: Secondary | ICD-10-CM | POA: Diagnosis not present

## 2021-12-07 DIAGNOSIS — Z79899 Other long term (current) drug therapy: Secondary | ICD-10-CM

## 2021-12-07 DIAGNOSIS — R110 Nausea: Secondary | ICD-10-CM | POA: Diagnosis not present

## 2021-12-07 DIAGNOSIS — R197 Diarrhea, unspecified: Secondary | ICD-10-CM

## 2021-12-07 DIAGNOSIS — C719 Malignant neoplasm of brain, unspecified: Secondary | ICD-10-CM | POA: Diagnosis not present

## 2021-12-08 ENCOUNTER — Ambulatory Visit: Payer: Self-pay

## 2021-12-08 ENCOUNTER — Telehealth: Payer: Self-pay | Admitting: Internal Medicine

## 2021-12-08 LAB — COMP. METABOLIC PANEL (12)
AST: 21 IU/L (ref 0–40)
Albumin/Globulin Ratio: 1.2 (ref 1.2–2.2)
Albumin: 3.7 g/dL — ABNORMAL LOW (ref 4.0–5.0)
Alkaline Phosphatase: 54 IU/L (ref 44–121)
BUN/Creatinine Ratio: 10 (ref 9–20)
BUN: 10 mg/dL (ref 6–24)
Bilirubin Total: 0.7 mg/dL (ref 0.0–1.2)
Calcium: 9.1 mg/dL (ref 8.7–10.2)
Chloride: 102 mmol/L (ref 96–106)
Creatinine, Ser: 1.02 mg/dL (ref 0.76–1.27)
Globulin, Total: 3 g/dL (ref 1.5–4.5)
Glucose: 120 mg/dL — ABNORMAL HIGH (ref 70–99)
Potassium: 4.7 mmol/L (ref 3.5–5.2)
Sodium: 140 mmol/L (ref 134–144)
Total Protein: 6.7 g/dL (ref 6.0–8.5)
eGFR: 93 mL/min/{1.73_m2} (ref >59)

## 2021-12-08 LAB — BASIC METABOLIC PANEL: CO2: 25 mmol/L (ref 20–29)

## 2021-12-08 NOTE — Telephone Encounter (Signed)
Patients mother called in for lab results , please call back.

## 2021-12-08 NOTE — Telephone Encounter (Signed)
Patients mother called in wantin to know should paitent be taking new md, citalopram  as well as the  mirtazapine. Please call back   Left message to call back.

## 2021-12-08 NOTE — Telephone Encounter (Signed)
Routing to provider for lab results.

## 2021-12-08 NOTE — Telephone Encounter (Signed)
Pt called, left VM to call office back. Unable to reach patient after 3 attempts by [facility] NT, routing to the provider for resolution per protocol.  Patients mother called in wantin to know should paitent be taking new md, citalopram  as well as the  mirtazapine. Please call back

## 2021-12-08 NOTE — Telephone Encounter (Signed)
2nd attempt, pt's mother called and left VM to call back to discuss with a nurse

## 2021-12-09 NOTE — Telephone Encounter (Signed)
Spoke with pts mother Reynolds Bowl and she stated that they dont need to make an appt. She stated she already spoke with Tammy about it.

## 2021-12-09 NOTE — Telephone Encounter (Signed)
Returned patients call, spoke with his mother, informed her that he should stop taking the Citalopram and to continue taking the mirtazapine.  She did want to let Dr. Neomia Dear know that the mirtazapine is working for the Depression and insomnia but is not working for his appetite. Mother wanted to know if there was something different that can be prescribed to help increase his appetite.

## 2021-12-09 NOTE — Telephone Encounter (Signed)
Would need appt with pcp to discuss

## 2021-12-10 ENCOUNTER — Other Ambulatory Visit: Payer: Medicare (Managed Care)

## 2021-12-10 ENCOUNTER — Other Ambulatory Visit: Payer: Self-pay

## 2021-12-10 DIAGNOSIS — Z789 Other specified health status: Secondary | ICD-10-CM

## 2021-12-10 DIAGNOSIS — Z79899 Other long term (current) drug therapy: Secondary | ICD-10-CM

## 2021-12-10 DIAGNOSIS — R197 Diarrhea, unspecified: Secondary | ICD-10-CM

## 2021-12-10 DIAGNOSIS — C719 Malignant neoplasm of brain, unspecified: Secondary | ICD-10-CM

## 2021-12-10 DIAGNOSIS — R110 Nausea: Secondary | ICD-10-CM

## 2021-12-10 NOTE — Telephone Encounter (Signed)
Will need to wait for PCP's return.

## 2021-12-13 ENCOUNTER — Other Ambulatory Visit: Payer: Medicare (Managed Care)

## 2021-12-13 ENCOUNTER — Ambulatory Visit (INDEPENDENT_AMBULATORY_CARE_PROVIDER_SITE_OTHER): Payer: Medicare (Managed Care) | Admitting: Nurse Practitioner

## 2021-12-13 ENCOUNTER — Ambulatory Visit: Payer: Self-pay | Admitting: *Deleted

## 2021-12-13 ENCOUNTER — Other Ambulatory Visit: Payer: Self-pay

## 2021-12-13 ENCOUNTER — Encounter: Payer: Self-pay | Admitting: Nurse Practitioner

## 2021-12-13 VITALS — BP 102/71 | HR 87 | Temp 98.2°F | Ht 68.0 in | Wt 138.0 lb

## 2021-12-13 DIAGNOSIS — L0591 Pilonidal cyst without abscess: Secondary | ICD-10-CM | POA: Diagnosis not present

## 2021-12-13 DIAGNOSIS — R197 Diarrhea, unspecified: Secondary | ICD-10-CM | POA: Diagnosis not present

## 2021-12-13 DIAGNOSIS — C719 Malignant neoplasm of brain, unspecified: Secondary | ICD-10-CM | POA: Diagnosis not present

## 2021-12-13 DIAGNOSIS — Z789 Other specified health status: Secondary | ICD-10-CM | POA: Diagnosis not present

## 2021-12-13 DIAGNOSIS — Z79899 Other long term (current) drug therapy: Secondary | ICD-10-CM | POA: Diagnosis not present

## 2021-12-13 DIAGNOSIS — R110 Nausea: Secondary | ICD-10-CM | POA: Diagnosis not present

## 2021-12-13 MED ORDER — MUPIROCIN 2 % EX OINT: 1.0000 "application " | TOPICAL_OINTMENT | Freq: Two times a day (BID) | CUTANEOUS | 0 refills | Status: DC

## 2021-12-13 MED ORDER — DOXYCYCLINE HYCLATE 100 MG PO TABS: 100.0000 mg | ORAL_TABLET | Freq: Two times a day (BID) | ORAL | 0 refills | Status: AC

## 2021-12-13 NOTE — Telephone Encounter (Signed)
Summary: cyst and medication issue   Pts mom asked to leave a message for DR. Vigg or her nurse to let them know that the Pt developed a cyst on his tailbone and he was put on an antidepressant and it is not well by him/ please advise     Attempted to call patient/mother- left message to call back

## 2021-12-13 NOTE — Assessment & Plan Note (Signed)
Acute to left coccyx area.  Will place urgent referral to general surgery as would benefit from ability to lay down comfortably for I&D procedure due to current weakness and chemo + in more sensitive area -- would benefit from general surgery.  Start on Doxycycline and Mupirocin due to infection concerns.  These are recurrent, monitor closely.

## 2021-12-13 NOTE — Patient Instructions (Signed)
Pilonidal Cyst A pilonidal cyst is a fluid-filled sac that forms beneath the skin near the tailbone, at the top of the crease of the buttocks (pilonidal area). If the cyst is not large and not infected, it may not cause any problems. If the cyst becomes irritated or infected, it may get larger and fill with pus. An infected cyst is called an abscess. A pilonidal abscess may cause pain and swelling, and it may need to be drained or removed. What are the causes? The cause of this condition is not always known. In some cases, a hair that grows into your skin (ingrown hair) may be the cause. What increases the risk? You are more likely to get a pilonidal cyst if you: Are male. Have lots of hair near the crease of the buttocks. Are overweight. Have a dimple near the crease of the buttocks. Wear tight clothing. Do not bathe or shower often. Sit for long periods of time. What are the signs or symptoms? Signs and symptoms of a pilonidal cyst may include pain, swelling, redness, and warmth in the pilonidal area. Depending on how big the cyst is, you may be able to feel a lump near your tailbone. If your cyst becomes infected, symptoms may include: Pus or fluid drainage. Fever. Pain, swelling, and redness getting worse. The lump getting bigger. How is this diagnosed? This condition may be diagnosed based on: Your symptoms and medical history. A physical exam. A blood test to check for infection. Testing a pus sample, if applicable. How is this treated? If your cyst does not cause symptoms, you may not need any treatment. If your cyst bothers you or is infected, you may need a procedure to drain or remove the cyst. Depending on the size, location, and severity of your cyst, your health care provider may: Make an incision in the cyst and drain it (incision and drainage). Open and drain the cyst, and then stitch the wound so that it stays open while it heals (marsupialization). You will be given  instructions about how to care for your open wound while it heals. Remove all or part of the cyst, and then close the wound (cyst removal). You may need to take antibiotic medicines before your procedure. Follow these instructions at home: Medicines Take over-the-counter and prescription medicines only as told by your health care provider. If you were prescribed an antibiotic medicine, take it as told by your health care provider. Do not stop taking the antibiotic even if you start to feel better. General instructions Keep the area around your pilonidal cyst clean and dry. If there is fluid or pus draining from your cyst: Cover the area with a clean bandage (dressing) as needed. Wash the area gently with soap and water. Pat the area dry with a clean towel. Do not rub the area because that may cause bleeding. Remove hair from the area around the cyst only if your health care provider tells you to do this. Do not wear tight pants or sit in one position for long periods at a time. Keep all follow-up visits as told by your health care provider. This is important. Contact a health care provider if you have: New redness, swelling, or pain. A fever. Severe pain. Summary A pilonidal cyst is a fluid-filled sac that forms beneath the skin near the tailbone, at the top of the crease of the buttocks (pilonidal area). If the cyst becomes irritated or infected, it may get larger and fill with pus. An infected  cyst is called an abscess. The cause of this condition is not always known. In some cases, a hair that grows into your skin (ingrown hair) may be the cause. If your cyst does not cause symptoms, you may not need any treatment. If your cyst bothers you or is infected, you may need a procedure to drain or remove the cyst. This information is not intended to replace advice given to you by your health care provider. Make sure you discuss any questions you have with your health care provider. Document  Revised: 09/10/2020 Document Reviewed: 09/10/2020 Elsevier Patient Education  2022 [employer].

## 2021-12-13 NOTE — Progress Notes (Signed)
BP 102/71    Pulse 87    Temp 98.2 F (36.8 C) (Oral)    Ht 5' 8"  (1.727 m)    Wt 138 lb (62.6 kg)    SpO2 98%    BMI 20.98 kg/m    Subjective:    Patient ID: Cory Hayes, male    DOB: Aug 23, 1976, 46 y.o.   MRN: [id number]  HPI: Damaree Sargent is a 46 y.o. male  Chief Complaint  Patient presents with   Cyst    Patient mother states the cyst came about on Thursday. Patient mother states it has been weeping and it has been draining. Patient states he still has some pain in the area. Patient denies trying any medication over the counter. Patient mother has been keeping the area clean with sterile water and bandages.    SKIN INFECTION Noticed cyst to bottom on Thursday -- right to tail bone.  He reports getting these quite a bit, as sister does too.  His father gets these as well. Duration: days Location: tailbone History of trauma in area: no Pain: yes Quality: yes Severity: 6/10 Redness: yes Swelling: yes Oozing: yes Pus: no Fevers: no Nausea/vomiting: no Status: stable Treatments attempted:warm compresses  Tetanus: UTD   Relevant past medical, surgical, family and social history reviewed and updated as indicated. Interim medical history since our last visit reviewed. Allergies and medications reviewed and updated.  Review of Systems  Constitutional:  Negative for activity change, diaphoresis, fatigue and fever.  Respiratory:  Negative for cough, chest tightness, shortness of breath and wheezing.   Cardiovascular:  Negative for chest pain, palpitations and leg swelling.  Gastrointestinal: Negative.   Skin:  Positive for wound.  Neurological: Negative.   Psychiatric/Behavioral: Negative.     Per HPI unless specifically indicated above     Objective:    BP 102/71    Pulse 87    Temp 98.2 F (36.8 C) (Oral)    Ht 5' 8"  (1.727 m)    Wt 138 lb (62.6 kg)    SpO2 98%    BMI 20.98 kg/m   Wt Readings from Last 3 Encounters:  12/13/21 138 lb (62.6 kg)  11/18/21 138 lb  3.2 oz (62.7 kg)  09/16/21 142 lb 3.2 oz (64.5 kg)    Physical Exam Vitals and nursing note reviewed.  Constitutional:      General: He is awake. He is not in acute distress.    Appearance: He is well-developed and well-groomed. He is not ill-appearing or toxic-appearing.  HENT:     Head: Normocephalic and atraumatic.     Right Ear: Hearing normal. No drainage.     Left Ear: Hearing normal. No drainage.  Eyes:     General: Lids are normal.        Right eye: No discharge.        Left eye: No discharge.     Conjunctiva/sclera: Conjunctivae normal.     Pupils: Pupils are equal, round, and reactive to light.  Neck:     Vascular: No carotid bruit.  Cardiovascular:     Rate and Rhythm: Normal rate and regular rhythm.     Heart sounds: Normal heart sounds, S1 normal and S2 normal. No murmur heard.   No gallop.  Pulmonary:     Effort: Pulmonary effort is normal. No accessory muscle usage or respiratory distress.     Breath sounds: Normal breath sounds.  Abdominal:     General: Bowel sounds are normal.  Palpations: Abdomen is soft. There is no hepatomegaly or splenomegaly.  Musculoskeletal:        General: Normal range of motion.     Cervical back: Normal range of motion and neck supple.     Right lower leg: No edema.     Left lower leg: No edema.  Skin:    General: Skin is warm and dry.     Capillary Refill: Capillary refill takes less than 2 seconds.     Findings: No rash.       Neurological:     Mental Status: He is alert and oriented to person, place, and time.     Deep Tendon Reflexes: Reflexes are normal and symmetric.  Psychiatric:        Attention and Perception: Attention normal.        Mood and Affect: Mood normal.        Speech: Speech normal.        Behavior: Behavior normal. Behavior is cooperative.        Thought Content: Thought content normal.    Results for orders placed or performed in visit on 12/07/21  Comp. Metabolic Panel (12)  Result Value Ref  Range   Glucose 120 (H) 70 - 99 mg/dL   BUN 10 6 - 24 mg/dL   Creatinine, Ser 1.02 0.76 - 1.27 mg/dL   eGFR 93 >59 mL/min/1.73   BUN/Creatinine Ratio 10 9 - 20   Sodium 140 134 - 144 mmol/L   Potassium 4.7 3.5 - 5.2 mmol/L   Chloride 102 96 - 106 mmol/L   Calcium 9.1 8.7 - 10.2 mg/dL   Total Protein 6.7 6.0 - 8.5 g/dL   Albumin 3.7 (L) 4.0 - 5.0 g/dL   Globulin, Total 3.0 1.5 - 4.5 g/dL   Albumin/Globulin Ratio 1.2 1.2 - 2.2   Bilirubin Total 0.7 0.0 - 1.2 mg/dL   Alkaline Phosphatase 54 44 - 121 IU/L   AST 21 0 - 40 IU/L  Basic Metabolic Panel (BMET)  Result Value Ref Range   CO2 25 20 - 29 mmol/L      Assessment & Plan:   Problem List Items Addressed This Visit       Musculoskeletal and Integument   Pilonidal cyst - Primary    Acute to left coccyx area.  Will place urgent referral to general surgery as would benefit from ability to lay down comfortably for I&D procedure due to current weakness and chemo + in more sensitive area -- would benefit from general surgery.  Start on Doxycycline and Mupirocin due to infection concerns.  These are recurrent, monitor closely.      Relevant Medications   doxycycline (VIBRA-TABS) 100 MG tablet   Other Relevant Orders   Ambulatory referral to General Surgery     Follow up plan: Return if symptoms worsen or fail to improve.

## 2021-12-13 NOTE — Telephone Encounter (Signed)
Reason for Disposition  [1] Spreading redness around the boil AND [2] no fever  Answer Assessment - Initial Assessment Questions 1. APPEARANCE of BOIL: "What does the boil look like?"      Cory Hayes, mother calling in.   It started as a hemorrhoid.  Now it's a cyst on his butt top of his crack.   Using a heating pad, warm compresses.   It has drained on and off over the weekend. 2. LOCATION: "Where is the boil located?"      Top of his butt crack 3. NUMBER: "How many boils are there?"      1 4. SIZE: "How big is the boil?" (e.g., inches, cm; compare to size of a coin or other object)     It's long oval shaped 1/2 inch long.   It's decreasing in size.   He has had these for 15 yrs on and off.    5. ONSET: "When did the boil start?"     Thur.   Swelling.   By Friday it was increasing in thickness, drainage began Sat.   Drainage is clear.  No pus.   6. PAIN: "Is there any pain?" If Yes, ask: "How bad is the pain?"   (Scale 1-10; or mild, moderate, severe)     Yes very painful.    He has to be positioned off of it.   7. FEVER: "Do you have a fever?" If Yes, ask: "What is it, how was it measured, and when did it start?"      No.       8. SOURCE: "Have you been around anyone with boils or other Staph infections?" "Have you ever had boils before?"     *No Answer* 9. OTHER SYMPTOMS: "Do you have any other symptoms?" (e.g., shaking chills, weakness, rash elsewhere on body)     *No Answer* 10. PREGNANCY: "Is there any chance you are pregnant?" "When was your last menstrual period?"       *No Answer*  Protocols used: Boil (Skin Abscess)-A-AH  Chief Complaint: Cyst at top of butt crack red, painful and draining.   The antidepressant Dr. Neomia Dear started him on is not helping his appetite and he is back to being up and down during the night again.   It helped the first 3 nights he took it. Symptoms: Cyst that is draining clear fluid, red and painful Frequency: Since Thur. Cyst started Pertinent Negatives:  Patient denies Fever. Disposition: [] ED /[] Urgent Care (no appt availability in office) / [x] Appointment(In office/virtual)/ []  [facility]/ [] Home Care/ [] Refused Recommended Disposition /[] [facility]/ []  Follow-up with PCP Additional Notes: I warm transferred the call into the office to I[place] who is talking with Cory Hayes, his mother getting him scheduled.   Dr. Neomia Dear is out of the office today.

## 2021-12-14 LAB — BASIC METABOLIC PANEL
BUN/Creatinine Ratio: 16 (ref 9–20)
BUN: 11 mg/dL (ref 6–24)
CO2: 23 mmol/L (ref 20–29)
Calcium: 8.7 mg/dL (ref 8.7–10.2)
Chloride: 100 mmol/L (ref 96–106)
Creatinine, Ser: 0.7 mg/dL — ABNORMAL LOW (ref 0.76–1.27)
Glucose: 87 mg/dL (ref 70–99)
Potassium: 4.3 mmol/L (ref 3.5–5.2)
Sodium: 137 mmol/L (ref 134–144)
eGFR: 117 mL/min/{1.73_m2} (ref >59)

## 2021-12-14 LAB — CBC WITH DIFFERENTIAL/PLATELET
Basophils Absolute: 0 10*3/uL (ref 0.0–0.2)
Basos: 0 %
EOS (ABSOLUTE): 0 10*3/uL (ref 0.0–0.4)
Eos: 1 %
Hematocrit: 33.7 % — ABNORMAL LOW (ref 37.5–51.0)
Hemoglobin: 11.9 g/dL — ABNORMAL LOW (ref 13.0–17.7)
Immature Grans (Abs): 0 10*3/uL (ref 0.0–0.1)
Immature Granulocytes: 1 %
Lymphocytes Absolute: 0.7 10*3/uL (ref 0.7–3.1)
Lymphs: 37 %
MCH: 38.6 pg — ABNORMAL HIGH (ref 26.6–33.0)
MCHC: 35.3 g/dL (ref 31.5–35.7)
MCV: 109 fL — ABNORMAL HIGH (ref 79–97)
Monocytes Absolute: 0.2 10*3/uL (ref 0.1–0.9)
Monocytes: 13 %
Neutrophils Absolute: 0.9 10*3/uL — ABNORMAL LOW (ref 1.4–7.0)
Neutrophils: 48 %
Platelets: 189 10*3/uL (ref 150–450)
RBC: 3.08 x10E6/uL — ABNORMAL LOW (ref 4.14–5.80)
RDW: 13.5 % (ref 11.6–15.4)
WBC: 1.8 10*3/uL — CL (ref 3.4–10.8)

## 2021-12-14 NOTE — Progress Notes (Signed)
WBC count very low today at 1.8 will need to fu with heme onc for such he is currently on chemo last dose 1/19.  Of note has had a cyst / abcess on his back which he is on abx for currently. He will be seeing gen surg for ? Drianage per Mom.  Pt advised to keep a mask on if he goes to any medical office to protect himself. Lolita Faulds

## 2021-12-15 ENCOUNTER — Telehealth: Payer: Self-pay | Admitting: Internal Medicine

## 2021-12-15 ENCOUNTER — Ambulatory Visit: Payer: Medicare (Managed Care) | Admitting: Surgery

## 2021-12-15 NOTE — Telephone Encounter (Signed)
Spoke with patient's mother and informed her that Dr. Neomia Dear wanted to refer to pateint's oncologist regarding his blood work and if he would need to have this redrawn. Mother also stated that patient ate a very good meal last night.

## 2021-12-15 NOTE — Telephone Encounter (Signed)
Copied from [place] [id number]. Topic: General - Other >> Dec 15, 2021  8:16 AM Valere Dross wrote: Reason for CRM: Pt mother called in stating if PCP or Tammy could give her a call back about pts Labs, please advise.

## 2021-12-16 ENCOUNTER — Ambulatory Visit: Payer: Medicare (Managed Care) | Admitting: Surgery

## 2021-12-16 ENCOUNTER — Other Ambulatory Visit: Payer: Self-pay

## 2021-12-16 ENCOUNTER — Other Ambulatory Visit: Payer: Medicare (Managed Care)

## 2021-12-16 DIAGNOSIS — Z79899 Other long term (current) drug therapy: Secondary | ICD-10-CM | POA: Diagnosis not present

## 2021-12-16 DIAGNOSIS — G9389 Other specified disorders of brain: Secondary | ICD-10-CM | POA: Diagnosis not present

## 2021-12-16 DIAGNOSIS — R197 Diarrhea, unspecified: Secondary | ICD-10-CM

## 2021-12-16 DIAGNOSIS — Z789 Other specified health status: Secondary | ICD-10-CM | POA: Diagnosis not present

## 2021-12-16 DIAGNOSIS — C719 Malignant neoplasm of brain, unspecified: Secondary | ICD-10-CM

## 2021-12-16 DIAGNOSIS — Z5189 Encounter for other specified aftercare: Secondary | ICD-10-CM | POA: Diagnosis not present

## 2021-12-16 DIAGNOSIS — R110 Nausea: Secondary | ICD-10-CM

## 2021-12-16 DIAGNOSIS — M899 Disorder of bone, unspecified: Secondary | ICD-10-CM | POA: Diagnosis not present

## 2021-12-17 ENCOUNTER — Telehealth: Payer: Self-pay | Admitting: Internal Medicine

## 2021-12-17 LAB — CMP14+EGFR
ALT: 13 IU/L (ref 0–44)
AST: 18 IU/L (ref 0–40)
Albumin/Globulin Ratio: 1.4 (ref 1.2–2.2)
Albumin: 3.4 g/dL — ABNORMAL LOW (ref 4.0–5.0)
Alkaline Phosphatase: 57 IU/L (ref 44–121)
BUN/Creatinine Ratio: 18 (ref 9–20)
BUN: 15 mg/dL (ref 6–24)
Bilirubin Total: 0.4 mg/dL (ref 0.0–1.2)
CO2: 24 mmol/L (ref 20–29)
Calcium: 8.5 mg/dL — ABNORMAL LOW (ref 8.7–10.2)
Chloride: 101 mmol/L (ref 96–106)
Creatinine, Ser: 0.83 mg/dL (ref 0.76–1.27)
Globulin, Total: 2.5 g/dL (ref 1.5–4.5)
Glucose: 110 mg/dL — ABNORMAL HIGH (ref 70–99)
Potassium: 4 mmol/L (ref 3.5–5.2)
Sodium: 136 mmol/L (ref 134–144)
Total Protein: 5.9 g/dL — ABNORMAL LOW (ref 6.0–8.5)
eGFR: 111 mL/min/{1.73_m2} (ref >59)

## 2021-12-17 LAB — CBC WITH DIFFERENTIAL/PLATELET
Basophils Absolute: 0 10*3/uL (ref 0.0–0.2)
Basos: 0 %
EOS (ABSOLUTE): 0 10*3/uL (ref 0.0–0.4)
Eos: 1 %
Hematocrit: 32.3 % — ABNORMAL LOW (ref 37.5–51.0)
Hemoglobin: 11.7 g/dL — ABNORMAL LOW (ref 13.0–17.7)
Immature Grans (Abs): 0 10*3/uL (ref 0.0–0.1)
Immature Granulocytes: 0 %
Lymphocytes Absolute: 1.1 10*3/uL (ref 0.7–3.1)
Lymphs: 45 %
MCH: 39.5 pg — ABNORMAL HIGH (ref 26.6–33.0)
MCHC: 36.2 g/dL — ABNORMAL HIGH (ref 31.5–35.7)
MCV: 109 fL — ABNORMAL HIGH (ref 79–97)
Monocytes Absolute: 0.3 10*3/uL (ref 0.1–0.9)
Monocytes: 12 %
Neutrophils Absolute: 1.1 10*3/uL — ABNORMAL LOW (ref 1.4–7.0)
Neutrophils: 42 %
Platelets: 193 10*3/uL (ref 150–450)
RBC: 2.96 x10E6/uL — ABNORMAL LOW (ref 4.14–5.80)
RDW: 13.3 % (ref 11.6–15.4)
WBC: 2.5 10*3/uL — CL (ref 3.4–10.8)

## 2021-12-17 NOTE — Telephone Encounter (Signed)
Copied from [place] [id number]. Topic: General - Other >> Dec 17, 2021  9:39 AM Tessa Lerner A wrote: Reason for CRM: The patient's other has requested to speak with a member of staff when possible about recent lab work from 12/16/21  Please contact further when available

## 2021-12-20 ENCOUNTER — Other Ambulatory Visit (INDEPENDENT_AMBULATORY_CARE_PROVIDER_SITE_OTHER): Payer: Medicare (Managed Care)

## 2021-12-20 ENCOUNTER — Other Ambulatory Visit: Payer: Self-pay

## 2021-12-20 DIAGNOSIS — Z789 Other specified health status: Secondary | ICD-10-CM

## 2021-12-20 DIAGNOSIS — Z79899 Other long term (current) drug therapy: Secondary | ICD-10-CM

## 2021-12-20 DIAGNOSIS — R110 Nausea: Secondary | ICD-10-CM | POA: Diagnosis not present

## 2021-12-20 DIAGNOSIS — C719 Malignant neoplasm of brain, unspecified: Secondary | ICD-10-CM

## 2021-12-20 DIAGNOSIS — R197 Diarrhea, unspecified: Secondary | ICD-10-CM

## 2021-12-20 NOTE — Telephone Encounter (Signed)
Patient was made aware of lab results from the [facility]. No further action taken

## 2021-12-21 LAB — CBC WITH DIFFERENTIAL/PLATELET
Basophils Absolute: 0 10*3/uL (ref 0.0–0.2)
Basos: 0 %
EOS (ABSOLUTE): 0 10*3/uL (ref 0.0–0.4)
Eos: 0 %
Hematocrit: 32.7 % — ABNORMAL LOW (ref 37.5–51.0)
Hemoglobin: 11.9 g/dL — ABNORMAL LOW (ref 13.0–17.7)
Immature Grans (Abs): 0 10*3/uL (ref 0.0–0.1)
Immature Granulocytes: 1 %
Lymphocytes Absolute: 0.9 10*3/uL (ref 0.7–3.1)
Lymphs: 27 %
MCH: 38.8 pg — ABNORMAL HIGH (ref 26.6–33.0)
MCHC: 36.4 g/dL — ABNORMAL HIGH (ref 31.5–35.7)
MCV: 107 fL — ABNORMAL HIGH (ref 79–97)
Monocytes Absolute: 0.4 10*3/uL (ref 0.1–0.9)
Monocytes: 12 %
Neutrophils Absolute: 2.1 10*3/uL (ref 1.4–7.0)
Neutrophils: 60 %
Platelets: 159 10*3/uL (ref 150–450)
RBC: 3.07 x10E6/uL — ABNORMAL LOW (ref 4.14–5.80)
RDW: 13.7 % (ref 11.6–15.4)
WBC: 3.4 10*3/uL (ref 3.4–10.8)

## 2021-12-21 LAB — BASIC METABOLIC PANEL
BUN/Creatinine Ratio: 13 (ref 9–20)
BUN: 13 mg/dL (ref 6–24)
CO2: 21 mmol/L (ref 20–29)
Calcium: 8.6 mg/dL — ABNORMAL LOW (ref 8.7–10.2)
Chloride: 102 mmol/L (ref 96–106)
Creatinine, Ser: 0.99 mg/dL (ref 0.76–1.27)
Glucose: 94 mg/dL (ref 70–99)
Potassium: 4.4 mmol/L (ref 3.5–5.2)
Sodium: 135 mmol/L (ref 134–144)
eGFR: 96 mL/min/{1.73_m2} (ref >59)

## 2021-12-22 ENCOUNTER — Encounter: Payer: Self-pay | Admitting: Internal Medicine

## 2021-12-22 ENCOUNTER — Telehealth (INDEPENDENT_AMBULATORY_CARE_PROVIDER_SITE_OTHER): Payer: Medicare (Managed Care) | Admitting: Internal Medicine

## 2021-12-22 DIAGNOSIS — L0591 Pilonidal cyst without abscess: Secondary | ICD-10-CM | POA: Diagnosis not present

## 2021-12-22 DIAGNOSIS — W19XXXD Unspecified fall, subsequent encounter: Secondary | ICD-10-CM | POA: Diagnosis not present

## 2021-12-22 DIAGNOSIS — W19XXXA Unspecified fall, initial encounter: Secondary | ICD-10-CM | POA: Insufficient documentation

## 2021-12-22 NOTE — Progress Notes (Signed)
There were no vitals taken for this visit.   Subjective:    Patient ID: Cory Hayes, male    DOB: Sep 17, 1976, 46 y.o.   MRN: [id number]  Chief Complaint  Patient presents with   [place] getting out of night    Pilonidal cyst    Was given  abx from [facility] for 7 days, still has it.    Diarrhea    Not watery but mushy    Appetite better    States that is a little better.     HPI: Cory Hayes is a 46 y.o. male   This visit was completed via video visit through MyChart due to the restrictions of the COVID-19 pandemic. All issues as above were discussed and addressed. Physical exam was done as above through visual confirmation on video through MyChart. If it was felt that the patient should be evaluated in the office, they were directed there. The patient verbally consented to this visit. Location of the patient: home Location of the provider: work Those involved with this call:  Provider: Charlynne Cousins, MD CMA: Frazier Butt, [place] Desk/Registration: [employer]  Time spent on call: 15 minutes with patient face to face via video conference. More than 50% of this time was spent in counseling and coordination of care. 15 minutes total spent in review of patient's record and preparation of their chart.    Fall Incident onset: new onset sunday evening when he was getting out of bed to go to the bathroom , has portable toilet now in bedroom left side is weak left arm hand leg and foot do not work. Fall occurred: MRI next wedensday.  Diarrhea  This is a recurrent (mushy stools. no blood in stools.) problem.   Chief Complaint  Patient presents with   [place] getting out of night    Pilonidal cyst    Was given  abx from [facility] for 7 days, still has it.    Diarrhea    Not watery but mushy    Appetite better    States that is a little better.     Relevant past medical, surgical, family and social history reviewed and updated as indicated. Interim  medical history since our last visit reviewed. Allergies and medications reviewed and updated.  Review of Systems  Gastrointestinal:  Positive for diarrhea.   Per HPI unless specifically indicated above     Objective:    There were no vitals taken for this visit.  Wt Readings from Last 3 Encounters:  12/23/21 141 lb (64 kg)  12/13/21 138 lb (62.6 kg)  11/18/21 138 lb 3.2 oz (62.7 kg)    Physical Exam Vitals and nursing note reviewed.  Constitutional:      General: He is not in acute distress.    Appearance: Normal appearance. He is not ill-appearing or diaphoretic.  HENT:     Head: Normocephalic and atraumatic.     Right Ear: Tympanic membrane and external ear normal. There is no impacted cerumen.     Left Ear: External ear normal.     Nose: No congestion or rhinorrhea.     Mouth/Throat:     Pharynx: No oropharyngeal exudate or posterior oropharyngeal erythema.  Eyes:     Conjunctiva/sclera: Conjunctivae normal.     Pupils: Pupils are equal, round, and reactive to light.  Cardiovascular:     Rate and Rhythm: Normal rate and regular rhythm.     Heart sounds: No  murmur heard.   No friction rub. No gallop.  Pulmonary:     Effort: No respiratory distress.     Breath sounds: No stridor. No wheezing or rhonchi.  Chest:     Chest wall: No tenderness.  Abdominal:     General: Abdomen is flat. Bowel sounds are normal.     Palpations: Abdomen is soft. There is no mass.     Tenderness: There is no abdominal tenderness.  Musculoskeletal:        General: No swelling, tenderness, deformity or signs of injury.     Cervical back: Normal range of motion and neck supple. No rigidity or tenderness.     Right lower leg: No edema.     Left lower leg: No edema.  Skin:    General: Skin is warm and dry.  Neurological:     Mental Status: He is alert.    Results for orders placed or performed in visit on 70/26/37  Basic Metabolic Panel (BMET)  Result Value Ref Range   Glucose 94 70 -  99 mg/dL   BUN 13 6 - 24 mg/dL   Creatinine, Ser 0.99 0.76 - 1.27 mg/dL   eGFR 96 >59 mL/min/1.73   BUN/Creatinine Ratio 13 9 - 20   Sodium 135 134 - 144 mmol/L   Potassium 4.4 3.5 - 5.2 mmol/L   Chloride 102 96 - 106 mmol/L   CO2 21 20 - 29 mmol/L   Calcium 8.6 (L) 8.7 - 10.2 mg/dL  CBC with Differential/Platelet  Result Value Ref Range   WBC 3.4 3.4 - 10.8 x10E3/uL   RBC 3.07 (L) 4.14 - 5.80 x10E6/uL   Hemoglobin 11.9 (L) 13.0 - 17.7 g/dL   Hematocrit 32.7 (L) 37.5 - 51.0 %   MCV 107 (H) 79 - 97 fL   MCH 38.8 (H) 26.6 - 33.0 pg   MCHC 36.4 (H) 31.5 - 35.7 g/dL   RDW 13.7 11.6 - 15.4 %   Platelets 159 150 - 450 x10E3/uL   Neutrophils 60 Not Estab. %   Lymphs 27 Not Estab. %   Monocytes 12 Not Estab. %   Eos 0 Not Estab. %   Basos 0 Not Estab. %   Neutrophils Absolute 2.1 1.4 - 7.0 x10E3/uL   Lymphocytes Absolute 0.9 0.7 - 3.1 x10E3/uL   Monocytes Absolute 0.4 0.1 - 0.9 x10E3/uL   EOS (ABSOLUTE) 0.0 0.0 - 0.4 x10E3/uL   Basophils Absolute 0.0 0.0 - 0.2 x10E3/uL   Immature Granulocytes 1 Not Estab. %   Immature Grans (Abs) 0.0 0.0 - 0.1 x10E3/uL        Current Outpatient Medications:    cloBAZam (ONFI) 10 MG tablet, Take 1 tablet by mouth 2 (two) times daily., Disp: , Rfl:    cyanocobalamin 1000 MCG tablet, Take by mouth., Disp: , Rfl:    divalproex (DEPAKOTE) 250 MG DR tablet, Take 5 tablets by mouth 2 (two) times daily., Disp: , Rfl:    GLEOSTINE 100 MG capsule, Take by mouth., Disp: , Rfl:    lactobacillus acidophilus & bulgar (LACTINEX) chewable tablet, Chew 1 tablet by mouth 3 (three) times daily with meals., Disp: 30 tablet, Rfl: 1   lactobacillus acidophilus & bulgar (LACTINEX) chewable tablet, Chew 1 tablet by mouth as needed (take it after every stools)., Disp: 30 tablet, Rfl: 1   levETIRAcetam (KEPPRA) 1000 MG tablet, Take 2,000 mg by mouth 2 (two) times daily., Disp: , Rfl:    lomustine (CEENU) 40 MG capsule, Every 6 weeks.,  Disp: , Rfl:    melatonin 3  MG TABS tablet, Take 1 tablet by mouth daily as needed., Disp: , Rfl:    mupirocin ointment (BACTROBAN) 2 %, Apply 1 application topically 2 (two) times daily., Disp: 22 g, Rfl: 0   ondansetron (ZOFRAN) 4 MG tablet, Take 4 mg by mouth every 8 (eight) hours as needed., Disp: , Rfl:    pantoprazole (PROTONIX) 40 MG tablet, Take 1 tablet (40 mg total) by mouth daily., Disp: 30 tablet, Rfl: 6   Potassium Chloride ER 20 MEQ TBCR, TAKE 2 TABLETS (40 MEQ TOTAL) BY MOUTH 2 (TWO) TIMES DAILY WITH MEALS FOR 30 DAYS (Patient taking differently: Take 20 mEq by mouth daily.), Disp: 540 tablet, Rfl: 2   sodium chloride 1 g tablet, Take 1 g by mouth 2 (two) times daily with a meal., Disp: , Rfl:    doxycycline (VIBRAMYCIN) 100 MG capsule, Take 100 mg by mouth 2 (two) times daily., Disp: , Rfl:    mirtazapine (REMERON) 15 MG tablet, TAKE 1 TABLET BY MOUTH EVERYDAY AT BEDTIME, Disp: 90 tablet, Rfl: 1    Assessment & Plan:  Pilonidal cyst s/p doxycycline No surg at current time. When they talked to heme onc they didn't recommend surg at this time.  Pt to fu with surg as cyst is recurrent and not healed yet.   Left sided weakness persistent x 2008 has been a gradual decline.  Sees PT for such  Will need to touch abse with neurology for such scheudled to have an MRI next week  Fu with me x 1 month   Problem List Items Addressed This Visit       Musculoskeletal and Integument   Pilonidal cyst - Primary     Other   Fall     No orders of the defined types were placed in this encounter.    No orders of the defined types were placed in this encounter.    Follow up plan: No follow-ups on file.

## 2021-12-23 ENCOUNTER — Encounter: Payer: Self-pay | Admitting: Surgery

## 2021-12-23 ENCOUNTER — Ambulatory Visit (INDEPENDENT_AMBULATORY_CARE_PROVIDER_SITE_OTHER): Payer: Medicare (Managed Care) | Admitting: Surgery

## 2021-12-23 ENCOUNTER — Other Ambulatory Visit: Payer: Self-pay

## 2021-12-23 VITALS — BP 109/76 | HR 101 | Temp 98.6°F | Ht 67.0 in | Wt 141.0 lb

## 2021-12-23 DIAGNOSIS — L0591 Pilonidal cyst without abscess: Secondary | ICD-10-CM

## 2021-12-23 NOTE — Patient Instructions (Signed)
Please call our office if you have any questions or concerns.  

## 2021-12-23 NOTE — Progress Notes (Signed)
Patient ID: Cory Hayes, male   DOB: 06/28/1976, 46 y.o.   MRN: [id number]  Chief Complaint: Question of pilonidal cyst/abscess  History of Present Illness Cory Hayes is a 46 y.o. male with a tailbone lesion with drainage noted late in January with associated pain, redness swelling and drainage.  Medications and included warm compresses, and currently in antibiotic ointment and dressing over the area.  No prior incision and drainage of the area.  No prior surgery to this area, no prior decubiti present here either.  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     cyanocobalamin 1000 MCG tablet Take by mouth.     divalproex (DEPAKOTE) 250 MG DR tablet Take 5 tablets by mouth 2 (two) times daily.     doxycycline (VIBRAMYCIN) 100 MG capsule Take 100 mg by mouth 2 (two) times daily.     GLEOSTINE 100 MG capsule Take by mouth.     lactobacillus acidophilus & bulgar (LACTINEX) chewable tablet Chew 1 tablet by mouth 3 (three) times daily with meals. 30 tablet 1   lactobacillus acidophilus & bulgar (LACTINEX) chewable tablet Chew 1 tablet by mouth as needed (take it after every stools). 30 tablet 1   levETIRAcetam (KEPPRA) 1000 MG tablet Take 2,000 mg by mouth 2 (two) times daily.     lomustine (CEENU) 40 MG capsule Every 6 weeks.     melatonin 3 MG TABS tablet Take 1 tablet by mouth daily as needed.     mirtazapine (REMERON) 15 MG tablet Take 1 tablet (15 mg total) by mouth at bedtime. 30 tablet 4   Multiple Vitamin  (QUINTABS) TABS Take 1 tablet by mouth daily.     mupirocin ointment (BACTROBAN) 2 % Apply 1 application topically 2 (two) times daily. 22 g 0   ondansetron (ZOFRAN) 4 MG tablet Take 4 mg by mouth every 8 (eight) hours as needed.     pantoprazole (PROTONIX) 40 MG tablet Take 1 tablet (40 mg total) by mouth daily. 30 tablet 6   Potassium Chloride ER 20 MEQ TBCR TAKE 2 TABLETS (40 MEQ TOTAL) BY MOUTH 2 (TWO) TIMES DAILY WITH MEALS FOR 30 DAYS (Patient taking differently: Take 20 mEq by mouth daily.) 540 tablet 2   sodium chloride 1 g tablet Take 1 g by mouth 2 (two) times daily with a meal.     No current facility-administered medications for this visit.    Family History Family History  Problem Relation Age of Onset   Throat cancer Father    Heart attack Maternal Grandmother    Ovarian cancer Maternal Grandmother    Stomach cancer Maternal Grandmother    Heart disease Maternal Grandmother    Prostate cancer Maternal Grandfather       Social History Social History   Tobacco Use   Smoking status: Former    Types: Cigarettes    Quit date: 2015    Years since quitting: 8.1  Smokeless tobacco: Never  Vaping Use   Vaping Use: Every day  Substance Use Topics   Alcohol use: Not Currently    Comment: over 2 years ago   Drug use: Yes    Types: Marijuana    Comment: smokes daily per patient           Physical Exam Blood pressure 109/76, pulse (!) 101, temperature 98.6 F (37 C), temperature source Oral, height 5\' 7"  (1.702 m), weight 141 lb (64 kg), SpO2 96 %. Last Weight  Most recent update: 12/23/2021  2:20 PM    Weight  64 kg (141 lb)             CONSTITUTIONAL: Wheelchair-bound, thin with left-sided neurological deficit, otherwise bright, pleasant appreciative and very cooperative along with being appropriately responsive and aware without distress.   EYES: Sclera non-icteric.   EARS, NOSE, MOUTH AND THROAT: Mask worn.    Hearing is intact to voice.  NECK: Trachea  is midline, and there is no jugular venous distension.  LYMPH NODES:  Lymph nodes in the neck are not enlarged. MUSCULOSKELETAL: Significant motor dysfunction/spasticity of the left side. SKIN: Skin turgor is normal. No pathologic skin lesions appreciated.  There is a cyst anterior to his right ear, there is another much smaller cyst on the cephalad aspect of his right buttock.  There is some near scarring appearance in the sacrococcygeal area, with a tubular area that may be scar.  At present there is no skin defect for drainage, I find no pits consistent with a pilonidal etiology.  The skin appears quite healthy without evidence of any pressure deterioration. NEUROLOGIC:  Motor and sensation appear grossly abnormal.  He seems to function looks much better with his right side  PSYCH:  Alert and oriented to person, place and time. Affect is appropriate for situation.  Gracious gentleman.  Data Reviewed I have personally reviewed what is currently available of the patient's imaging, recent labs and medical records.   Labs:  CBC Latest Ref Rng & Units 12/20/2021 12/16/2021 12/13/2021  WBC 3.4 - 10.8 x10E3/uL 3.4 2.5(LL) 1.8(LL)  Hemoglobin 13.0 - 17.7 g/dL 11.9(L) 11.7(L) 11.9(L)  Hematocrit 37.5 - 51.0 % 32.7(L) 32.3(L) 33.7(L)  Platelets 150 - 450 x10E3/uL 159 193 189   CMP Latest Ref Rng & Units 12/20/2021 12/16/2021 12/13/2021  Glucose 70 - 99 mg/dL 94 110(H) 87  BUN 6 - 24 mg/dL 13 15 11   Creatinine 0.76 - 1.27 mg/dL 0.99 0.83 0.70(L)  Sodium 134 - 144 mmol/L 135 136 137  Potassium 3.5 - 5.2 mmol/L 4.4 4.0 4.3  Chloride 96 - 106 mmol/L 102 101 100  CO2 20 - 29 mmol/L 21 24 23   Calcium 8.7 - 10.2 mg/dL 8.6(L) 8.5(L) 8.7  Total Protein 6.0 - 8.5 g/dL - 5.9(L) -  Total Bilirubin 0.0 - 1.2 mg/dL - 0.4 -  Alkaline Phos 44 - 121 IU/L - 57 -  AST 0 - 40 IU/L - 18 -  ALT 0 - 44 IU/L - 13 -      Imaging:  Within last 24 hrs: No results found.  Assessment    I am not certain what I should do  from a pilonidal cyst perspective, I may need to wait for its recurrence to intervene.  At present what ever management, topical or oral or what ever has enabled resolution of the apparent inflammatory changes that were previously noted. 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  I advised that I believe the course of topical antibiotics over that sacral area has done an adequate job been maintaining the dressings are not necessary.  I will be glad to see this gentleman again should any of these issues become troublesome.  Face-to-face time spent with the patient and accompanying care providers(if present) was 20 minutes, with more than 50% of the time spent counseling, educating, and coordinating care of the patient.    These notes generated with voice recognition software. I apologize for typographical errors.  Ronny Bacon M.D., FACS 12/23/2021, 2:26 PM

## 2021-12-24 ENCOUNTER — Telehealth: Payer: Self-pay | Admitting: Internal Medicine

## 2021-12-24 ENCOUNTER — Other Ambulatory Visit: Payer: Self-pay | Admitting: Internal Medicine

## 2021-12-24 NOTE — Telephone Encounter (Signed)
Caller wanted to inform PCP patient was seen at [facility] surgical for a cyst on tailbone and specialist advised to leave it alone and did not want to lancet at this moment.

## 2021-12-24 NOTE — Telephone Encounter (Signed)
Requested Prescriptions  Pending Prescriptions Disp Refills   mirtazapine (REMERON) 15 MG tablet [Pharmacy Med Name: MIRTAZAPINE 15 MG TABLET] 90 tablet 1    Sig: TAKE 1 TABLET BY MOUTH EVERYDAY AT BEDTIME     Psychiatry: Antidepressants - mirtazapine Passed - 12/24/2021  8:32 AM      Passed - Completed PHQ-2 or PHQ-9 in the last 360 days      Passed - Valid encounter within last 6 months    Recent Outpatient Visits          2 days ago Pilonidal cyst   [facility] Vigg, Avanti, MD   1 week ago Pilonidal cyst   [place], Henrine Screws T, NP   3 weeks ago Depression, recurrent ([place])   [facility] Vigg, Avanti, MD   1 month ago Diarrhea, unspecified type   [facility] Vigg, Avanti, MD   3 months ago Diarrhea, unspecified type   [place] Vigg, Avanti, MD      Future Appointments            In 6 months [place], [place]

## 2021-12-27 ENCOUNTER — Other Ambulatory Visit: Payer: Medicare (Managed Care)

## 2021-12-28 ENCOUNTER — Other Ambulatory Visit: Payer: Medicare (Managed Care)

## 2021-12-30 ENCOUNTER — Ambulatory Visit: Payer: Medicare (Managed Care) | Admitting: Internal Medicine

## 2022-01-06 ENCOUNTER — Ambulatory Visit: Payer: Medicare (Managed Care) | Admitting: Internal Medicine

## 2022-01-06 ENCOUNTER — Other Ambulatory Visit (INDEPENDENT_AMBULATORY_CARE_PROVIDER_SITE_OTHER): Payer: Medicare (Managed Care)

## 2022-01-06 ENCOUNTER — Other Ambulatory Visit: Payer: Self-pay

## 2022-01-06 ENCOUNTER — Other Ambulatory Visit: Payer: Medicare (Managed Care)

## 2022-01-06 DIAGNOSIS — R110 Nausea: Secondary | ICD-10-CM

## 2022-01-06 DIAGNOSIS — Z789 Other specified health status: Secondary | ICD-10-CM

## 2022-01-06 DIAGNOSIS — C719 Malignant neoplasm of brain, unspecified: Secondary | ICD-10-CM

## 2022-01-06 DIAGNOSIS — Z79899 Other long term (current) drug therapy: Secondary | ICD-10-CM

## 2022-01-06 DIAGNOSIS — R197 Diarrhea, unspecified: Secondary | ICD-10-CM

## 2022-01-07 LAB — CBC WITH DIFFERENTIAL/PLATELET
Basophils Absolute: 0 10*3/uL (ref 0.0–0.2)
Basos: 0 %
EOS (ABSOLUTE): 0.1 10*3/uL (ref 0.0–0.4)
Eos: 2 %
Hematocrit: 37.5 % (ref 37.5–51.0)
Hemoglobin: 12.9 g/dL — ABNORMAL LOW (ref 13.0–17.7)
Immature Grans (Abs): 0 10*3/uL (ref 0.0–0.1)
Immature Granulocytes: 0 %
Lymphocytes Absolute: 1 10*3/uL (ref 0.7–3.1)
Lymphs: 34 %
MCH: 37.8 pg — ABNORMAL HIGH (ref 26.6–33.0)
MCHC: 34.4 g/dL (ref 31.5–35.7)
MCV: 110 fL — ABNORMAL HIGH (ref 79–97)
Monocytes Absolute: 0.3 10*3/uL (ref 0.1–0.9)
Monocytes: 11 %
Neutrophils Absolute: 1.5 10*3/uL (ref 1.4–7.0)
Neutrophils: 53 %
Platelets: 109 10*3/uL — ABNORMAL LOW (ref 150–450)
RBC: 3.41 x10E6/uL — ABNORMAL LOW (ref 4.14–5.80)
RDW: 13.8 % (ref 11.6–15.4)
WBC: 2.9 10*3/uL — ABNORMAL LOW (ref 3.4–10.8)

## 2022-01-07 LAB — BASIC METABOLIC PANEL
BUN/Creatinine Ratio: 10 (ref 9–20)
BUN: 8 mg/dL (ref 6–24)
CO2: 23 mmol/L (ref 20–29)
Calcium: 9.1 mg/dL (ref 8.7–10.2)
Chloride: 103 mmol/L (ref 96–106)
Creatinine, Ser: 0.77 mg/dL (ref 0.76–1.27)
Glucose: 100 mg/dL — ABNORMAL HIGH (ref 70–99)
Potassium: 4.5 mmol/L (ref 3.5–5.2)
Sodium: 136 mmol/L (ref 134–144)
eGFR: 113 mL/min/{1.73_m2} (ref >59)

## 2022-01-19 DIAGNOSIS — C719 Malignant neoplasm of brain, unspecified: Secondary | ICD-10-CM | POA: Diagnosis not present

## 2022-01-19 DIAGNOSIS — C711 Malignant neoplasm of frontal lobe: Secondary | ICD-10-CM | POA: Diagnosis not present

## 2022-01-24 DIAGNOSIS — C711 Malignant neoplasm of frontal lobe: Secondary | ICD-10-CM | POA: Diagnosis not present

## 2022-01-27 DIAGNOSIS — C719 Malignant neoplasm of brain, unspecified: Secondary | ICD-10-CM | POA: Diagnosis not present

## 2022-02-08 DIAGNOSIS — E876 Hypokalemia: Secondary | ICD-10-CM | POA: Diagnosis not present

## 2022-02-08 DIAGNOSIS — Z87891 Personal history of nicotine dependence: Secondary | ICD-10-CM | POA: Diagnosis not present

## 2022-02-08 DIAGNOSIS — C719 Malignant neoplasm of brain, unspecified: Secondary | ICD-10-CM | POA: Diagnosis not present

## 2022-02-08 DIAGNOSIS — S22009S Unspecified fracture of unspecified thoracic vertebra, sequela: Secondary | ICD-10-CM | POA: Diagnosis not present

## 2022-02-08 DIAGNOSIS — E871 Hypo-osmolality and hyponatremia: Secondary | ICD-10-CM | POA: Diagnosis not present

## 2022-02-08 DIAGNOSIS — F1290 Cannabis use, unspecified, uncomplicated: Secondary | ICD-10-CM | POA: Diagnosis not present

## 2022-02-08 DIAGNOSIS — R1310 Dysphagia, unspecified: Secondary | ICD-10-CM | POA: Diagnosis not present

## 2022-02-08 DIAGNOSIS — G8929 Other chronic pain: Secondary | ICD-10-CM | POA: Diagnosis not present

## 2022-02-08 DIAGNOSIS — G40909 Epilepsy, unspecified, not intractable, without status epilepticus: Secondary | ICD-10-CM | POA: Diagnosis not present

## 2022-02-08 DIAGNOSIS — Z79899 Other long term (current) drug therapy: Secondary | ICD-10-CM | POA: Diagnosis not present

## 2022-02-08 DIAGNOSIS — F419 Anxiety disorder, unspecified: Secondary | ICD-10-CM | POA: Diagnosis not present

## 2022-02-08 DIAGNOSIS — G8194 Hemiplegia, unspecified affecting left nondominant side: Secondary | ICD-10-CM | POA: Diagnosis not present

## 2022-02-08 DIAGNOSIS — Z923 Personal history of irradiation: Secondary | ICD-10-CM | POA: Diagnosis not present

## 2022-02-24 ENCOUNTER — Other Ambulatory Visit: Payer: Medicare (Managed Care)

## 2022-02-24 DIAGNOSIS — R197 Diarrhea, unspecified: Secondary | ICD-10-CM

## 2022-02-24 DIAGNOSIS — M899 Disorder of bone, unspecified: Secondary | ICD-10-CM | POA: Diagnosis not present

## 2022-02-24 DIAGNOSIS — Z789 Other specified health status: Secondary | ICD-10-CM

## 2022-02-24 DIAGNOSIS — Z79899 Other long term (current) drug therapy: Secondary | ICD-10-CM | POA: Diagnosis not present

## 2022-02-24 DIAGNOSIS — Z5189 Encounter for other specified aftercare: Secondary | ICD-10-CM | POA: Diagnosis not present

## 2022-02-24 DIAGNOSIS — C719 Malignant neoplasm of brain, unspecified: Secondary | ICD-10-CM

## 2022-02-24 DIAGNOSIS — G9389 Other specified disorders of brain: Secondary | ICD-10-CM | POA: Diagnosis not present

## 2022-02-24 DIAGNOSIS — R110 Nausea: Secondary | ICD-10-CM

## 2022-02-25 LAB — CMP14+EGFR
ALT: 14 IU/L (ref 0–44)
AST: 21 IU/L (ref 0–40)
Albumin/Globulin Ratio: 1.4 (ref 1.2–2.2)
Albumin: 3.5 g/dL — ABNORMAL LOW (ref 4.0–5.0)
Alkaline Phosphatase: 76 IU/L (ref 44–121)
BUN/Creatinine Ratio: 13 (ref 9–20)
BUN: 12 mg/dL (ref 6–24)
Bilirubin Total: 0.6 mg/dL (ref 0.0–1.2)
CO2: 24 mmol/L (ref 20–29)
Calcium: 8.7 mg/dL (ref 8.7–10.2)
Chloride: 103 mmol/L (ref 96–106)
Creatinine, Ser: 0.91 mg/dL (ref 0.76–1.27)
Globulin, Total: 2.5 g/dL (ref 1.5–4.5)
Glucose: 107 mg/dL — ABNORMAL HIGH (ref 70–99)
Potassium: 4.5 mmol/L (ref 3.5–5.2)
Sodium: 138 mmol/L (ref 134–144)
Total Protein: 6 g/dL (ref 6.0–8.5)
eGFR: 107 mL/min/{1.73_m2} (ref >59)

## 2022-02-25 LAB — CBC WITH DIFFERENTIAL/PLATELET
Basophils Absolute: 0 10*3/uL (ref 0.0–0.2)
Basos: 0 %
EOS (ABSOLUTE): 0 10*3/uL (ref 0.0–0.4)
Eos: 0 %
Hematocrit: 35.3 % — ABNORMAL LOW (ref 37.5–51.0)
Hemoglobin: 12.7 g/dL — ABNORMAL LOW (ref 13.0–17.7)
Immature Grans (Abs): 0 10*3/uL (ref 0.0–0.1)
Immature Granulocytes: 0 %
Lymphocytes Absolute: 1.2 10*3/uL (ref 0.7–3.1)
Lymphs: 47 %
MCH: 39 pg — ABNORMAL HIGH (ref 26.6–33.0)
MCHC: 36 g/dL — ABNORMAL HIGH (ref 31.5–35.7)
MCV: 108 fL — ABNORMAL HIGH (ref 79–97)
Monocytes Absolute: 0.2 10*3/uL (ref 0.1–0.9)
Monocytes: 9 %
Neutrophils Absolute: 1.1 10*3/uL — ABNORMAL LOW (ref 1.4–7.0)
Neutrophils: 44 %
Platelets: 141 10*3/uL — ABNORMAL LOW (ref 150–450)
RBC: 3.26 x10E6/uL — ABNORMAL LOW (ref 4.14–5.80)
RDW: 13.5 % (ref 11.6–15.4)
WBC: 2.5 10*3/uL — CL (ref 3.4–10.8)

## 2022-02-28 NOTE — Progress Notes (Signed)
Pl let pt know he needs to contact his oncologist thnx. Wbc counts low again thnx

## 2022-03-07 DIAGNOSIS — C713 Malignant neoplasm of parietal lobe: Secondary | ICD-10-CM | POA: Diagnosis not present

## 2022-03-07 DIAGNOSIS — C719 Malignant neoplasm of brain, unspecified: Secondary | ICD-10-CM | POA: Diagnosis not present

## 2022-03-09 DIAGNOSIS — C719 Malignant neoplasm of brain, unspecified: Secondary | ICD-10-CM | POA: Diagnosis not present

## 2022-03-10 DIAGNOSIS — E876 Hypokalemia: Secondary | ICD-10-CM | POA: Diagnosis not present

## 2022-03-10 DIAGNOSIS — G8929 Other chronic pain: Secondary | ICD-10-CM | POA: Diagnosis not present

## 2022-03-10 DIAGNOSIS — F1290 Cannabis use, unspecified, uncomplicated: Secondary | ICD-10-CM | POA: Diagnosis not present

## 2022-03-10 DIAGNOSIS — Z79899 Other long term (current) drug therapy: Secondary | ICD-10-CM | POA: Diagnosis not present

## 2022-03-10 DIAGNOSIS — C719 Malignant neoplasm of brain, unspecified: Secondary | ICD-10-CM | POA: Diagnosis not present

## 2022-03-10 DIAGNOSIS — G8194 Hemiplegia, unspecified affecting left nondominant side: Secondary | ICD-10-CM | POA: Diagnosis not present

## 2022-03-10 DIAGNOSIS — Z87891 Personal history of nicotine dependence: Secondary | ICD-10-CM | POA: Diagnosis not present

## 2022-03-10 DIAGNOSIS — F419 Anxiety disorder, unspecified: Secondary | ICD-10-CM | POA: Diagnosis not present

## 2022-03-10 DIAGNOSIS — S22009S Unspecified fracture of unspecified thoracic vertebra, sequela: Secondary | ICD-10-CM | POA: Diagnosis not present

## 2022-03-10 DIAGNOSIS — G40909 Epilepsy, unspecified, not intractable, without status epilepticus: Secondary | ICD-10-CM | POA: Diagnosis not present

## 2022-03-10 DIAGNOSIS — Z923 Personal history of irradiation: Secondary | ICD-10-CM | POA: Diagnosis not present

## 2022-03-10 DIAGNOSIS — R1310 Dysphagia, unspecified: Secondary | ICD-10-CM | POA: Diagnosis not present

## 2022-03-10 DIAGNOSIS — E871 Hypo-osmolality and hyponatremia: Secondary | ICD-10-CM | POA: Diagnosis not present

## 2022-03-29 ENCOUNTER — Encounter: Payer: Self-pay | Admitting: Speech Pathology

## 2022-03-29 ENCOUNTER — Ambulatory Visit: Payer: Medicare (Managed Care)

## 2022-03-31 ENCOUNTER — Encounter: Payer: Self-pay | Admitting: Occupational Therapy

## 2022-03-31 ENCOUNTER — Ambulatory Visit: Payer: Medicare (Managed Care) | Attending: Nurse Practitioner | Admitting: Occupational Therapy

## 2022-03-31 DIAGNOSIS — I69923 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: Secondary | ICD-10-CM | POA: Diagnosis not present

## 2022-03-31 DIAGNOSIS — R41841 Cognitive communication deficit: Secondary | ICD-10-CM | POA: Insufficient documentation

## 2022-03-31 DIAGNOSIS — M6281 Muscle weakness (generalized): Secondary | ICD-10-CM | POA: Diagnosis not present

## 2022-03-31 DIAGNOSIS — R471 Dysarthria and anarthria: Secondary | ICD-10-CM | POA: Diagnosis not present

## 2022-03-31 DIAGNOSIS — R278 Other lack of coordination: Secondary | ICD-10-CM | POA: Insufficient documentation

## 2022-03-31 NOTE — Therapy (Signed)
[place] MAIN [facility] SERVICES [address] [place], [place], [address] Phone: [phone]   Fax:  [phone]  Occupational Therapy Evaluation  Patient Details  Name: Cory Hayes MRN: [id number] Date of Birth: 1976-10-11 Referring Provider (OT): Cheng-Ja Southerland   Encounter Date: 03/31/2022   OT End of Session - 03/31/22 1913     Visit Number 1    Number of Visits 24    Date for OT Re-Evaluation 06/23/22    Authorization Time Period progress reporting period starting 03/31/2022    OT Start Time 1100    OT Stop Time 1215    OT Time Calculation (min) 75 min    Activity Tolerance Patient tolerated treatment well    Behavior During Therapy [facility] for tasks assessed/performed             Past Medical History:  Diagnosis Date   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[place]) 2008   Seizures [facility])     Past Surgical History:  Procedure Laterality Date   BACK SURGERY  2017   CRANIOTOMY  2008, 2015, 12/21/20    There were no vitals filed for this visit.   Subjective Assessment - 03/31/22 1901     Subjective  Pt. was accomapnied by his mother    Patient is accompanied by: Family member    Pertinent History Pt. is a 46 y.o. 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 Pt. underwent  a craniotomy in 2015, and a resection in 12/2020. Pt. has received chemotherapy, and radiation. Pt. reports sustaining a fracture of the thoracic vertebrae in 2017. Pt. endorses a history of nmultiple falls, reporting approximately 20 in the last 6 months with his last fall being 2 days ago. Pt. resides with his mother, and70 y.o. son who assist pt. with daily care needs. Pt. enjoys reading, wathcing YouTube, cooking, and spending time with his family.    Patient Stated Goals To improve self-care    Currently in Pain? Yes    Pain Score 9     Pain Location Back    Pain  Orientation Mid    Pain Descriptors / Indicators Aching;Sore    Pain Type Chronic pain    Pain Onset More than a month ago    Multiple Pain Sites Yes    Pain Score 7    Pain Location Arm    Pain Orientation Left    Pain Descriptors / Indicators Aching;Sore;Sharp    Pain Type Acute pain    Pain Onset In the past 7 days    Pain Frequency Constant               [facility] OT Assessment - 03/31/22 0001       Assessment   Medical Diagnosis Right parietal Astrocytoma    Referring Provider (OT) Cheng-Ja Southerland    Hand Dominance Right    Next MD Visit 05/2022      Precautions   Precautions None    Required Braces or Orthoses --   LE     Restrictions   Weight Bearing Restrictions No      Balance Screen   Has the patient fallen in the past 6 months Yes    How many times? 20    Has the patient had a decrease in activity level because of a fear of falling?  Yes    Is the patient reluctant to leave their home because of  a fear of falling?  No      Home  Environment   Available Help at Discharge --   Mother, and son   Type of Home Aartment    Bathroom Shower/Tub Tub/Shower unit;Curtain    Home Equipment Bedside commode;Cane -quad;Walker - 4 wheels;Shower seat;Grab bars - tub/shower;Hand held shower head;Wheelchair - manual      Prior Function   Level of Independence Independent    Leisure Reading, YouTube, Spending Family      ADL   Eating/Feeding Independent   Difficulty cutting meat, Does not engage his left hand   Grooming Moderate assistance    Upper Body Bathing Maximal assistance    Lower Body Bathing Maximal assistance    Lower Body Dressing Maximal assistance    Toilet Transfer Moderate assistance;Maximal assistance    Toileting - Clothing Manipulation Moderate assistance;Maximal assistance    Toileting -  Hygiene Maximal assistance      IADL   Prior Level of Function Shopping Independent    Shopping Completely unable to shop    Prior Level of Function Light  Housekeeping Independent    Light Housekeeping Does not participate in any housekeeping tasks    Prior Level of Function Meal Prep Independent    Meal Prep Needs to have meals prepared and served    Prior Level of Function Scientist, research (physical sciences) Relies on family or friends for transportation    Prior Level of Function Medication Managment Independent    Medication Management Is not capable of dispensing or managing own medication    Prior Level of Function Therapist, sports financial matters independently (budgets, writes checks, pays rent, bills goes to bank), collects and keeps track of income      Written Expression   Dominant Hand Right    Handwriting 25% legible      Vision - History   Patient Visual Report Peripheral vision impairment      Activity Tolerance   Activity Tolerance Tolerates < 10 min activity with changes in vital signs      Cognition   Overall Cognitive Status Within Functional Limits for tasks assessed      Coordination   Gross Motor Movements are Fluid and Coordinated No    Fine Motor Movements are Fluid and Coordinated No      AROM   Overall AROM Comments RUE WFL, LUE TBA      Strength   Overall Strength --   RUE WFL, Left UE TBA     Hand Function   Right Hand Grip (lbs) 25    Right Hand Lateral Pinch 9 lbs    Left Hand Grip (lbs) 17    Left Hand Lateral Pinch 6 lbs                              OT Education - 03/31/22 1912     Education Details OT services, POC, goals    Person(s) Educated Patient;Parent(s)    Methods Explanation;Demonstration    Comprehension Verbalized understanding;Returned demonstration;Verbal cues required;Need further instruction;Tactile cues required              OT Short Term Goals - 03/31/22 1942       OT SHORT TERM GOAL #1   Title Pt. will improve FOTO to reflect pt. perceived progress with assessment specific  ADLs, and IADL tasks.    Baseline Eval: TBD  Time 12    Period Weeks    Status New    Target Date 05/12/22               OT Long Term Goals - 03/31/22 1944       OT LONG TERM GOAL #1   Title Pt. will require minA UE dressing skills    Baseline Eval: MaxA    Time 12    Period Weeks    Status New    Target Date 06/23/22      OT LONG TERM GOAL #2   Title Pt. will require modA LE dressing skills    Baseline Eval: MaxA    Time 12    Period Weeks    Status New    Target Date 06/23/22      OT LONG TERM GOAL #3   Title Pt. will demonstrate transfers to the [place] with minA    Baseline Eval: [place], when fatigued MaxA    Time 12    Period Weeks    Status New    Target Date 06/23/22      OT LONG TERM GOAL #4   Title Pt. will be independent with a HEP for the LUE    Baseline Eval: No current HEPs    Time 12    Period Weeks    Status New    Target Date 06/23/22      OT LONG TERM GOAL #5   Title Pt. will demonstrate visual compensatory strategies 100% of the time during ADLs, and IADLs    Baseline Eval: Currently does not utilize    Time 12    Period Weeks    Status New    Target Date 06/23/22      Long Term Additional Goals   Additional Long Term Goals Yes      OT LONG TERM GOAL #6   Title Pt. will improve left grip strength by 5#  in order to be able to hold, and stabilize items with ADLS.    Baseline Eval: eft grip strength: 17#    Time 12    Period Weeks    Status New    Target Date 06/23/22      OT LONG TERM GOAL #7   Title pt. will improve left lateral pinch strength by 3# to be able to hold, and stabilize a toothbush.    Baseline Eval: Left pinch strength 6#    Time 12    Period Weeks    Status New    Target Date 06/23/22                   Plan - 03/31/22 1921     Clinical Impression Statement Pt. is a 46 y.o. male who was diagnosed with a right parietal Grade 3  astrocytoma Grade 3 maglinant neoplasm. Pt. presents with impaired  LUE functioning, decreased left sided awareness, impaired vision, decreased safety awareness with multiple falls. Pt.'s family reports approximately 20 falls over the past 6 months with the most recent fall occurring 2 days ago when attempting to get out of bed to use the bedside commode during the night. Pt. reports having 9/10 back pain, and 7/10 left arm pain as pt.'s mother reports that his left scapular region his hit the table. Pt.'s mother reports a new bump at the left scapula which she noted was not there prior to the most recent fall 2 days ago. Pt., and caregiver were encouraged to make an appointment with his  physician for further evaulation, and assessment of his back, and shoulder. Pt. will benefit from OT services to work on improving LUE functioning, self-care, functional mobility,  and providing pt./caregiver education about posiitoning, left sided awareness, visual compensatory strategies, and safety awareness in order to maximize independence with ADLs, and IADL tasks.    OT Occupational Profile and History Comprehensive Assessment- Review of records and extensive additional review of physical, cognitive, psychosocial history related to current functional performance    Occupational performance deficits (Please refer to evaluation for details): IADL's;ADL's;Social Participation    Body Structure / Function / Physical Skills ADL;FMC;Endurance;Strength;ROM    Rehab Potential Good    Clinical Decision Making Multiple treatment options, significant modification of task necessary    Comorbidities Affecting Occupational Performance: Presence of comorbidities impacting occupational performance    Modification or Assistance to Complete Evaluation  Max significant modification of tasks or assist is necessary to complete    OT Frequency 2x / week    OT Duration 12 weeks    OT Treatment/Interventions Self-care/ADL training;Therapeutic exercise;Energy conservation;Neuromuscular education;DME and/or  AE instruction;Moist Heat;Therapeutic activities;Patient/family Educational psychologist;Contrast Bath;Passive range of motion;Cognitive remediation/compensation;Manual Therapy;Coping strategies training    Consulted and Agree with Plan of Care Family member/caregiver    Family Member Consulted Mother             Patient will benefit from skilled therapeutic intervention in order to improve the following deficits and impairments:   Body Structure / Function / Physical Skills: ADL, [place], Endurance, Strength, ROM       Visit Diagnosis: Muscle weakness (generalized)    Problem List Patient Active Problem List   Diagnosis Date Noted   Fall 12/22/2021   Pilonidal cyst 12/13/2021   Depression, recurrent ([place]) 12/01/2021   Diarrhea 11/18/2021   Nausea 11/18/2021   Unspecified convulsions ([place]) 09/29/2021   Urinary frequency 08/17/2021   Chronic pain syndrome 03/15/2021   Pharmacologic therapy 03/15/2021   Disorder of skeletal system 03/15/2021   Problems influencing health status 03/15/2021   Long term prescription benzodiazepine use ([place]ob[place]am) 03/15/2021   History of thoracic spinal fusion (T3 thru T7) 03/15/2021   Anxiety 03/14/2021   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[place]) 01/21/2021   Epilepsy ([place]) 01/21/2021   Hyponatremia 12/22/2020   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 [place], [place], [address] Phone: [phone]   Fax:  [phone]  Name: Cory Hayes MRN: [id number] Date of Birth: [birth year]

## 2022-04-01 ENCOUNTER — Encounter: Payer: Self-pay | Admitting: Family Medicine

## 2022-04-01 ENCOUNTER — Ambulatory Visit (INDEPENDENT_AMBULATORY_CARE_PROVIDER_SITE_OTHER): Payer: Medicare (Managed Care) | Admitting: Family Medicine

## 2022-04-01 VITALS — BP 104/72 | HR 75 | Temp 97.5°F

## 2022-04-01 DIAGNOSIS — L0591 Pilonidal cyst without abscess: Secondary | ICD-10-CM

## 2022-04-01 DIAGNOSIS — M545 Low back pain, unspecified: Secondary | ICD-10-CM

## 2022-04-01 DIAGNOSIS — M546 Pain in thoracic spine: Secondary | ICD-10-CM | POA: Diagnosis not present

## 2022-04-01 DIAGNOSIS — W19XXXD Unspecified fall, subsequent encounter: Secondary | ICD-10-CM | POA: Diagnosis not present

## 2022-04-01 MED ORDER — SULFAMETHOXAZOLE-TRIMETHOPRIM 800-160 MG PO TABS: 1.0000 | ORAL_TABLET | Freq: Two times a day (BID) | ORAL | 0 refills | Status: DC

## 2022-04-01 MED ORDER — METAXALONE 400 MG PO TABS: 200.0000 mg | ORAL_TABLET | Freq: Every day | ORAL | 2 refills | Status: DC

## 2022-04-01 MED ORDER — LIDOCAINE 5 % EX PTCH: 1.0000 | MEDICATED_PATCH | CUTANEOUS | 3 refills | Status: DC

## 2022-04-01 NOTE — Progress Notes (Signed)
BP 104/72   Pulse 75   Temp (!) 97.5 F (36.4 C)   SpO2 98%    Subjective:    Patient ID: Cory Hayes, male    DOB: 02-17-1976, 46 y.o.   MRN: [id number]  HPI: Cory Hayes is a 46 y.o. male  Chief Complaint  Patient presents with   Fall    Patient fell and hit the table next to his back on Wednesday morning. Patient has bruising on his back and a bulge on his left back.    Cyst    Patient has a cyst that has opened up 2 days ago and has been draining. Cyst is on the upper part of the buttock.    [place] out of bed 2x this week getting back from the commode. He did not bump his head or lose consciousness. He notes that he has some bruising and his PT noticed a lump on his back (this is his scapula which wings). He and his mother were concerned and brought him in to get checked out. He has been using marijuana to help with pain control and tylenol. He continues to have some home PT. He has not been having any radiating pain or point tenderness.   SKIN INFECTION Duration: about week, draining x 2 days  Location: nuchal cleft History of trauma in area: no Pain: yes Quality: tight and aching Severity: moderate Redness: yes Swelling: yes Oozing: yes Pus: yes Fevers: no Nausea/vomiting: no Status: worse Treatments attempted:warm compresses  Tetanus: UTD   Relevant past medical, surgical, family and social history reviewed and updated as indicated. Interim medical history since our last visit reviewed. Allergies and medications reviewed and updated.  Review of Systems  Constitutional: Negative.   Respiratory: Negative.    Cardiovascular: Negative.   Gastrointestinal: Negative.   Musculoskeletal:  Positive for back pain, gait problem and myalgias. Negative for arthralgias, joint swelling, neck pain and neck stiffness.  Skin:  Positive for color change and wound. Negative for pallor and rash.  Allergic/Immunologic: Negative.   Neurological:  Positive for weakness. Negative  for dizziness, tremors, seizures, syncope, facial asymmetry, speech difficulty, light-headedness, numbness and headaches.  Hematological: Negative.   Psychiatric/Behavioral: Negative.     Per HPI unless specifically indicated above     Objective:    BP 104/72   Pulse 75   Temp (!) 97.5 F (36.4 C)   SpO2 98%   Wt Readings from Last 3 Encounters:  04/07/22 123 lb (55.8 kg)  04/05/22 123 lb (55.8 kg)  12/23/21 141 lb (64 kg)    Physical Exam Vitals and nursing note reviewed.  Constitutional:      General: He is not in acute distress.    Appearance: Normal appearance. He is not ill-appearing, toxic-appearing or diaphoretic.     Comments: Wheelchair bound, L sided weakness  HENT:     Head: Normocephalic and atraumatic.     Right Ear: External ear normal.     Left Ear: External ear normal.     Nose: Nose normal.     Mouth/Throat:     Mouth: Mucous membranes are moist.     Pharynx: Oropharynx is clear.  Eyes:     General: No scleral icterus.       Right eye: No discharge.        Left eye: No discharge.     Extraocular Movements: Extraocular movements intact.     Conjunctiva/sclera: Conjunctivae normal.     Pupils: Pupils are equal, round, and  reactive to light.  Cardiovascular:     Rate and Rhythm: Normal rate and regular rhythm.     Pulses: Normal pulses.     Heart sounds: Normal heart sounds. No murmur heard.   No friction rub. No gallop.  Pulmonary:     Effort: Pulmonary effort is normal. No respiratory distress.     Breath sounds: Normal breath sounds. No stridor. No wheezing, rhonchi or rales.  Chest:     Chest wall: No tenderness.  Musculoskeletal:        General: Normal range of motion.     Cervical back: Normal range of motion and neck supple.  Skin:    General: Skin is warm and dry.     Capillary Refill: Capillary refill takes less than 2 seconds.     Coloration: Skin is not jaundiced or pale.     Findings: No bruising, erythema, lesion or rash.        Neurological:     General: No focal deficit present.     Mental Status: He is alert and oriented to person, place, and time. Mental status is at baseline.  Psychiatric:        Mood and Affect: Mood normal.        Behavior: Behavior normal.        Thought Content: Thought content normal.        Judgment: Judgment normal.    Results for orders placed or performed in visit on 02/24/22  CMP14+EGFR  Result Value Ref Range   Glucose 107 (H) 70 - 99 mg/dL   BUN 12 6 - 24 mg/dL   Creatinine, Ser 0.91 0.76 - 1.27 mg/dL   eGFR 107 >59 mL/min/1.73   BUN/Creatinine Ratio 13 9 - 20   Sodium 138 134 - 144 mmol/L   Potassium 4.5 3.5 - 5.2 mmol/L   Chloride 103 96 - 106 mmol/L   CO2 24 20 - 29 mmol/L   Calcium 8.7 8.7 - 10.2 mg/dL   Total Protein 6.0 6.0 - 8.5 g/dL   Albumin 3.5 (L) 4.0 - 5.0 g/dL   Globulin, Total 2.5 1.5 - 4.5 g/dL   Albumin/Globulin Ratio 1.4 1.2 - 2.2   Bilirubin Total 0.6 0.0 - 1.2 mg/dL   Alkaline Phosphatase 76 44 - 121 IU/L   AST 21 0 - 40 IU/L   ALT 14 0 - 44 IU/L  CBC with Differential/Platelet  Result Value Ref Range   WBC 2.5 (LL) 3.4 - 10.8 x10E3/uL   RBC 3.26 (L) 4.14 - 5.80 x10E6/uL   Hemoglobin 12.7 (L) 13.0 - 17.7 g/dL   Hematocrit 35.3 (L) 37.5 - 51.0 %   MCV 108 (H) 79 - 97 fL   MCH 39.0 (H) 26.6 - 33.0 pg   MCHC 36.0 (H) 31.5 - 35.7 g/dL   RDW 13.5 11.6 - 15.4 %   Platelets 141 (L) 150 - 450 x10E3/uL   Neutrophils 44 Not Estab. %   Lymphs 47 Not Estab. %   Monocytes 9 Not Estab. %   Eos 0 Not Estab. %   Basos 0 Not Estab. %   Neutrophils Absolute 1.1 (L) 1.4 - 7.0 x10E3/uL   Lymphocytes Absolute 1.2 0.7 - 3.1 x10E3/uL   Monocytes Absolute 0.2 0.1 - 0.9 x10E3/uL   EOS (ABSOLUTE) 0.0 0.0 - 0.4 x10E3/uL   Basophils Absolute 0.0 0.0 - 0.2 x10E3/uL   Immature Granulocytes 0 Not Estab. %   Immature Grans (Abs) 0.0 0.0 - 0.1 x10E3/uL  Assessment & Plan:   Problem List Items Addressed This Visit       Musculoskeletal and Integument    Pilonidal cyst - Primary    Will get him started on bactrim and get him into surgery ASAP. Await their input.        Relevant Orders   DG Lumbar Spine Complete (Completed)     Other   Fall    See discussion under back pain. Will check x-rays and treat. Call with any concerns.        Relevant Orders   DG Thoracic Spine W/Swimmers (Completed)   DG Lumbar Spine Complete (Completed)   Other Visit Diagnoses     Acute bilateral thoracic back pain       Will check x-ray to r/o fracture. Await results. Treat with lidoderm, skelaxin and tylenol. Call with any concerns. Continue to monitor.   Relevant Medications   metaxalone (SKELAXIN) 400 MG tablet   Other Relevant Orders   DG Thoracic Spine W/Swimmers (Completed)   Acute bilateral low back pain without sciatica       Will check x-ray to r/o fracture. Await results. Treat with lidoderm, skelaxin and tylenol. Call with any concerns. Continue to monitor.   Relevant Medications   metaxalone (SKELAXIN) 400 MG tablet        Follow up plan: Return if symptoms worsen or fail to improve.

## 2022-04-04 ENCOUNTER — Ambulatory Visit
Admission: RE | Admit: 2022-04-04 | Discharge: 2022-04-04 | Disposition: A | Discharge status: Home / Self Care | Payer: Medicare (Managed Care) | Attending: Family Medicine | Admitting: Family Medicine

## 2022-04-04 ENCOUNTER — Telehealth: Payer: Self-pay

## 2022-04-04 ENCOUNTER — Encounter: Payer: Self-pay | Admitting: Occupational Therapy

## 2022-04-04 ENCOUNTER — Ambulatory Visit: Payer: Medicare (Managed Care) | Admitting: Occupational Therapy

## 2022-04-04 ENCOUNTER — Ambulatory Visit
Admission: RE | Admit: 2022-04-04 | Discharge: 2022-04-04 | Disposition: A | Discharge status: Home / Self Care | Payer: Medicare (Managed Care) | Source: Ambulatory Visit | Attending: Family Medicine | Admitting: Family Medicine

## 2022-04-04 DIAGNOSIS — M549 Dorsalgia, unspecified: Secondary | ICD-10-CM | POA: Insufficient documentation

## 2022-04-04 DIAGNOSIS — M546 Pain in thoracic spine: Secondary | ICD-10-CM

## 2022-04-04 DIAGNOSIS — L0591 Pilonidal cyst without abscess: Secondary | ICD-10-CM | POA: Insufficient documentation

## 2022-04-04 DIAGNOSIS — M545 Low back pain, unspecified: Secondary | ICD-10-CM | POA: Diagnosis not present

## 2022-04-04 DIAGNOSIS — M6281 Muscle weakness (generalized): Secondary | ICD-10-CM

## 2022-04-04 DIAGNOSIS — W19XXXD Unspecified fall, subsequent encounter: Secondary | ICD-10-CM

## 2022-04-04 DIAGNOSIS — M4854XA Collapsed vertebra, not elsewhere classified, thoracic region, initial encounter for fracture: Secondary | ICD-10-CM | POA: Insufficient documentation

## 2022-04-04 DIAGNOSIS — R278 Other lack of coordination: Secondary | ICD-10-CM

## 2022-04-04 NOTE — Therapy (Signed)
[place] MAIN [facility] SERVICES [address] [place], [place], [address] Phone: [phone]   Fax:  [phone]  Occupational Therapy Treatment  Patient Details  Name: Cory Hayes MRN: [id number] Date of Birth: 1976-08-27 Referring Provider (OT): Cheng-Ja Southerland   Encounter Date: 04/04/2022   OT End of Session - 04/04/22 1743     Visit Number 2    Number of Visits 24    Date for OT Re-Evaluation 06/23/22    Authorization Time Period progress reporting period starting 03/31/2022    OT Start Time 1607    OT Stop Time 6962    OT Time Calculation (min) 38 min    Activity Tolerance Patient tolerated treatment well    Behavior During Therapy [facility] for tasks assessed/performed             Past Medical History:  Diagnosis Date   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[place]) 2008   Seizures [facility])     Past Surgical History:  Procedure Laterality Date   BACK SURGERY  2017   CRANIOTOMY  2008, 2015, 12/21/20    There were no vitals filed for this visit.   Subjective Assessment - 04/04/22 1741     Subjective  Pt. was accompanied by his mother    Patient is accompanied by: Family member    Pertinent History Pt. is a 46 y.o. 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 Pt. underwent  a craniotomy in 2015, and a resection in 12/2020. Pt. has received chemotherapy, and radiation. Pt. reports sustaining a fracture of the thoracic vertebrae in 2017. Pt. endorses a history of nmultiple falls, reporting approximately 20 in the last 6 months with his last fall being 2 days ago. Pt. resides with his mother, and54 y.o. son who assist pt. with daily care needs. Pt. enjoys reading, wathcing YouTube, cooking, and spending time with his family.    Currently in Pain? Yes    Pain Score 7     Pain Location Back    Pain Orientation Mid    Pain Descriptors /  Indicators Aching                [facility] OT Assessment - 04/04/22 1737       AROM   Overall AROM Comments Left shoulder flexion: 70(85), abduction: 56(95), Elbow -22 to 95 (-5 to140)      Strength   Overall Strength --   Left shoulder flexion, abduction, elbow flexion, abduction 3-/5, wrist extension 3+/5           Pt. had a follow-up appointment with his MD last week, and an x-ray of his shoulder/back today. Imaging revealed no acute fracture, and old compression fractures. Pt. reports less pain today 7/10 in mid back and LUE, Pt.'s FOTO score is 54, with a TR score of 56. Pt. was able to tolerate ROM today, and measurements were able to be obtained. Pt. continues to present with limited LUE functioning, decreased left sided awareness, visual impairments, as well as decreased head and neck control with them maintained in a forward flexion position. Pt. Is able to bring his head and neck to a neutral position with cues. Pt. Reports that he has been toileting at the [facility] independently since the last visit without any falls. Pt. Continues to work on improving UE functioning in order to improve,a nd maximize independence with ADLs, and IADL tasks.  OT Education - 04/04/22 1742     Education Details Left UE ROM, positioning    Person(s) Educated Patient;Parent(s)    Methods Explanation;Demonstration    Comprehension Verbalized understanding;Returned demonstration;Verbal cues required;Need further instruction;Tactile cues required              OT Short Term Goals - 03/31/22 1942       OT SHORT TERM GOAL #1   Title Pt. will improve FOTO to reflect pt. perceived progress with assessment specific ADLs, and IADL tasks.    Baseline Eval: TBD    Time 12    Period Weeks    Status New    Target Date 05/12/22               OT Long Term Goals - 03/31/22 1944       OT LONG TERM GOAL #1   Title Pt. will require minA UE dressing skills     Baseline Eval: MaxA    Time 12    Period Weeks    Status New    Target Date 06/23/22      OT LONG TERM GOAL #2   Title Pt. will require modA LE dressing skills    Baseline Eval: MaxA    Time 12    Period Weeks    Status New    Target Date 06/23/22      OT LONG TERM GOAL #3   Title Pt. will demonstrate transfers to the [place] with minA    Baseline Eval: [place], when fatigued MaxA    Time 12    Period Weeks    Status New    Target Date 06/23/22      OT LONG TERM GOAL #4   Title Pt. will be independent with a HEP for the LUE    Baseline Eval: No current HEPs    Time 12    Period Weeks    Status New    Target Date 06/23/22      OT LONG TERM GOAL #5   Title Pt. will demonstrate visual compensatory strategies 100% of the time during ADLs, and IADLs    Baseline Eval: Currently does not utilize    Time 12    Period Weeks    Status New    Target Date 06/23/22      Long Term Additional Goals   Additional Long Term Goals Yes      OT LONG TERM GOAL #6   Title Pt. will improve left grip strength by 5#  in order to be able to hold, and stabilize items with ADLS.    Baseline Eval: eft grip strength: 17#    Time 12    Period Weeks    Status New    Target Date 06/23/22      OT LONG TERM GOAL #7   Title pt. will improve left lateral pinch strength by 3# to be able to hold, and stabilize a toothbush.    Baseline Eval: Left pinch strength 6#    Time 12    Period Weeks    Status New    Target Date 06/23/22                   Plan - 04/04/22 1744     Clinical Impression Statement Pt. had a follow-up appointment with his MD last week, and an x-ray of his shoulder/back today. Imaging revealed no acute fracture, and old compression fractures. Pt. reports less pain today 7/10 in mid back and  LUE, Pt.'s FOTO score is 54, with a TR score of 56. Pt. was able to tolerate ROM today, and measurements were able to be obtained. Pt. continues to present with limited LUE  functioning, decreased left sided awareness, visual impairments, as well as decreased head and neck control with them maintained in a forward flexion position. Pt. Is able to bring his head and neck to a neutral position with cues. Pt. Reports that he has been toileting at the [facility] independently since the last visit without any falls. Pt. Continues to work on improving UE functioning in order to improve,a nd maximize independence with ADLs, and IADL tasks.    OT Occupational Profile and History Comprehensive Assessment- Review of records and extensive additional review of physical, cognitive, psychosocial history related to current functional performance    Occupational performance deficits (Please refer to evaluation for details): IADL's;ADL's;Social Participation    Body Structure / Function / Physical Skills ADL;FMC;Endurance;Strength;ROM    Rehab Potential Good    Clinical Decision Making Multiple treatment options, significant modification of task necessary    Comorbidities Affecting Occupational Performance: Presence of comorbidities impacting occupational performance    Modification or Assistance to Complete Evaluation  Max significant modification of tasks or assist is necessary to complete    OT Frequency 2x / week    OT Duration 12 weeks    OT Treatment/Interventions Self-care/ADL training;Therapeutic exercise;Energy conservation;Neuromuscular education;DME and/or AE instruction;Moist Heat;Therapeutic activities;Patient/family Educational psychologist;Contrast Bath;Passive range of motion;Cognitive remediation/compensation;Manual Therapy;Coping strategies training    Consulted and Agree with Plan of Care Family member/caregiver    Family Member Consulted Mother             Patient will benefit from skilled therapeutic intervention in order to improve the following deficits and impairments:   Body Structure / Function / Physical Skills: ADL, [place], Endurance, Strength, ROM        Visit Diagnosis: Muscle weakness (generalized)  Other lack of coordination    Problem List Patient Active Problem List   Diagnosis Date Noted   Fall 12/22/2021   Pilonidal cyst 12/13/2021   Depression, recurrent ([place]) 12/01/2021   Diarrhea 11/18/2021   Nausea 11/18/2021   Unspecified convulsions ([place]) 09/29/2021   Urinary frequency 08/17/2021   Chronic pain syndrome 03/15/2021   Pharmacologic therapy 03/15/2021   Disorder of skeletal system 03/15/2021   Problems influencing health status 03/15/2021   Long term prescription benzodiazepine use ([place]) 03/15/2021   History of thoracic spinal fusion (T3 thru T7) 03/15/2021   Anxiety 03/14/2021   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[place]) 01/21/2021   Epilepsy ([place]) 01/21/2021   Hyponatremia 12/22/2020   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

## 2022-04-04 NOTE — Telephone Encounter (Signed)
Spoke with patient mother Jacqlyn Larsen and let her know patient can be seen at [facility] on Tuesday May 23rd and they want the patient to be there by 4:15 pm. Patient mother verbalized understanding and has no further questions at this time. Patient mother was also provided with [facility] contact information in regards to patient's x-ray order.

## 2022-04-04 NOTE — Telephone Encounter (Signed)
-----   Message from Valerie Roys, DO sent at 04/01/2022  2:02 PM EDT ----- Can we see if Dr. [facility] can see him ASAP for a pilonidal cyst (has seen him for the same in February)

## 2022-04-05 ENCOUNTER — Encounter: Payer: Self-pay | Admitting: Surgery

## 2022-04-05 ENCOUNTER — Ambulatory Visit: Payer: Medicare (Managed Care) | Admitting: Speech Pathology

## 2022-04-05 ENCOUNTER — Ambulatory Visit (INDEPENDENT_AMBULATORY_CARE_PROVIDER_SITE_OTHER): Payer: Medicare (Managed Care) | Admitting: Surgery

## 2022-04-05 VITALS — BP 118/81 | HR 75 | Temp 98.0°F | Ht 68.0 in | Wt 123.0 lb

## 2022-04-05 DIAGNOSIS — L03319 Cellulitis of trunk, unspecified: Secondary | ICD-10-CM

## 2022-04-05 DIAGNOSIS — L02219 Cutaneous abscess of trunk, unspecified: Secondary | ICD-10-CM

## 2022-04-05 DIAGNOSIS — L929 Granulomatous disorder of the skin and subcutaneous tissue, unspecified: Secondary | ICD-10-CM | POA: Diagnosis not present

## 2022-04-05 DIAGNOSIS — R41841 Cognitive communication deficit: Secondary | ICD-10-CM

## 2022-04-05 DIAGNOSIS — R471 Dysarthria and anarthria: Secondary | ICD-10-CM

## 2022-04-05 DIAGNOSIS — M6281 Muscle weakness (generalized): Secondary | ICD-10-CM | POA: Diagnosis not present

## 2022-04-05 DIAGNOSIS — I69923 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: Secondary | ICD-10-CM

## 2022-04-05 NOTE — Therapy (Unsigned)
[place] MAIN [facility] SERVICES [address] [place], [place], [address] Phone: [phone]   Fax:  [phone]  Speech Language Pathology Evaluation  Patient Details  Name: Cory Hayes MRN: [id number] Date of Birth: 08/03/1976 No data recorded  Encounter Date: 04/05/2022   End of Session - 04/05/22 1758     Visit Number 1    Number of Visits 13    Date for SLP Re-Evaluation 07/04/22    SLP Start Time 2229   pt arrived late   SLP Stop Time  1500    SLP Time Calculation (min) 52 min    Activity Tolerance Patient tolerated treatment well             Past Medical History:  Diagnosis Date   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[place]) 2008   Seizures [facility])     Past Surgical History:  Procedure Laterality Date   BACK SURGERY  2017   CRANIOTOMY  2008, 2015, 12/21/20    There were no vitals filed for this visit.   Subjective Assessment - 04/05/22 1411     Subjective "My e-extensive brain surgeries has come with ra-rami-ramifications."    Patient is accompained by: Family member   Becky   Currently in Pain? Yes    Pain Score 7     Pain Location Back                SLP Evaluation [facility] - 04/05/22 1411       SLP Visit Information   SLP Received On 04/05/22    Medical Diagnosis astrocytoma             SUBJECTIVE:    PERTINENT HISTORY: Cory Hayes is a 46 y.o. 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 Underwent craniotomy in 2015; also s/p radiation, chemotherapy, and was admitted at [facility] for right parietal crani and tumor resection on 12/21/20. Post-operatively was evaluated for dysphagia by SLP, FEES on 12/24/20 with recommendation for NDD3/nectar liquids. Pt advanced to regular/thin by time of d/c from rehab.    FALLS: Has patient fallen in last 6 months?  Yes  LIVING ENVIRONMENT: Lives with: lives with their family Lives in: House/apartment  PLOF:   Level of assistance: Needed assistance with ADLs, Needed assistance with IADLS Employment: On disability   PATIENT GOALS : improve speech "enunciation" and cognition  OBJECTIVE:   DIAGNOSTIC FINDINGS: MRI brain 03/07/22: Postoperative changes of a right parietal craniotomy with subjacent resection cavity redemonstrated in the right parietal lobe. Surrounding increased signal on T2 weighted imaging is unchanged. No new or nodular contrast enhancement. Stable linear enhancement along the inferior margin of the resection cavity.   COGNITION: Overall cognitive status: {cognition:24006} Attention: {SLPattention:27086} Memory: {SLPmemory:27206} Awareness: {SLPawareness:27088} Executive function: {SLPEF:27090} Behavior: {behavior:24019} Functional deficits: ***  COGNITIVE COMMUNICATION Following directions: {commands:24018}  Auditory comprehension: {WFL-Impaired:25365} Verbal expression: {WFL-Impaired:25365} Functional communication: {WFL-Impaired:25365}  ORAL MOTOR EXAMINATION Facial : WFL Lingual: Strength Impaired: Impaired left, Impaired right Velum: WFL Mandible: WFL Cough: WFL Voice: Other: low vocal intensity  STANDARDIZED ASSESSMENTS: {SLPstandardizedassessment:27092}   PATIENT REPORTED OUTCOME MEASURES (PROM): Administered patient-reported outcome measure Multifactorial Memory Questionnaire (MMQ) Memory Mistakes: Raw score = ***, t score = ***, equivalent to *** (Below 20 "very low", 20-29 "low", 30-39 "below average", 40-60 "average", 60-70 "above average", 71-80 "high", above 80 "very high")          SLP Education - 04/05/22 1748     Education Details role of SLP, proposed  therapy goals    Person(s) Educated Patient;Parent(s)    Methods Explanation    Comprehension Verbalized understanding              SLP Short Term Goals - 04/05/22 1801       SLP SHORT TERM GOAL #1   Title Pt will completed standardized cognitive assessment and assessment of motor  speech.    Time 2    Period Weeks    Status New      SLP SHORT TERM GOAL #2   Title Pt will demonstrate HEP for motor speech >90% accuracy with min cues.    Time 6    Period Weeks    Status New      SLP SHORT TERM GOAL #3   Title Pt will initiate use of memory support system (external aid) and bring to 75% of therapy sessions.    Time 6    Period Weeks    Status New      SLP SHORT TERM GOAL #4   Title Patient will use intelligibility and fluency compensations in 5 minutes simple-mod complex conversation with rare min cues.    Time 6    Period Weeks    Status New              SLP Long Term Goals - 04/05/22 1805       SLP LONG TERM GOAL #1   Title Patient will report carryover of at least 3 memory strategies outside of ST.    Time 12    Period Weeks    Status New      SLP LONG TERM GOAL #2   Title Patient will increase vocal intensity in conversation to average >72 dB at 50 cm.    Baseline To be collected    Time 12    Period Weeks    Status New      SLP LONG TERM GOAL #3   Title Pt will use memory support system (external aid) to recall completed and upcoming appointments and birthdays >90% accuracy independently.    Time 12    Period Weeks    Status New      SLP LONG TERM GOAL #4   Title Pt will use intelligibility and fluency compensations effectively >90% of the time in 15 minutes mod complex- complex conversation.    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   Cellulitis and abscess of trunk 04/05/2022   Fall 12/22/2021   Pilonidal cyst 12/13/2021   Depression, recurrent ([place]) 12/01/2021    Diarrhea 11/18/2021   Nausea 11/18/2021   Unspecified convulsions ([place]) 09/29/2021   Urinary frequency 08/17/2021   Chronic pain syndrome 03/15/2021   Pharmacologic therapy 03/15/2021   Disorder of skeletal system 03/15/2021   Problems influencing health status 03/15/2021   Long term prescription benzodiazepine use ([place]) 03/15/2021   History of thoracic spinal fusion (T3 thru T7) 03/15/2021   Anxiety 03/14/2021   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[place]) 01/21/2021   Epilepsy ([place]) 01/21/2021   Hyponatremia 12/22/2020   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

## 2022-04-05 NOTE — Progress Notes (Signed)
Patient ID: Cory Hayes, male   DOB: Oct 31, 1976, 46 y.o.   MRN: [id number]  Chief Complaint: Question of pilonidal cyst/abscess  History of Present Illness Cory Hayes is a 46 y.o. male with a tailbone lesion with drainage noted again with associated pain, redness swelling and drainage.   No prior incision and drainage of the area.  No prior surgery to this area, no prior decubiti present here either.  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    Past Medical History Past Medical History:  Diagnosis Date   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[place]) 2008   Seizures [facility])       Past Surgical History:  Procedure Laterality Date   BACK SURGERY  2017   CRANIOTOMY  2008, 2015, 12/21/20    Allergies  Allergen Reactions   Lorazepam     High fever    Current Outpatient Medications  Medication Sig Dispense Refill   ascorbic acid (VITAMIN C) 500 MG tablet Take by mouth.     cloBAZam (ONFI) 10 MG tablet Take 1 tablet by mouth 2 (two) times daily.     cyanocobalamin 1000 MCG tablet Take by mouth.     divalproex (DEPAKOTE) 250 MG DR tablet Take 5 tablets by mouth 2 (two) times daily.     lactobacillus acidophilus & bulgar (LACTINEX) chewable tablet Chew 1 tablet by mouth 3 (three) times daily with meals. 30 tablet 1   lactobacillus acidophilus & bulgar (LACTINEX) chewable tablet Chew 1 tablet by mouth as needed (take it after every stools). 30 tablet 1   levETIRAcetam (KEPPRA) 1000 MG tablet Take 2,000 mg by mouth 2 (two) times daily.     lidocaine (LIDODERM) 5 % Place 1 patch onto the skin daily. Remove & Discard patch within 12 hours or as directed by MD 90 patch 3   lomustine (CEENU) 40 MG capsule Every 6 weeks.     melatonin 3 MG TABS tablet Take 1 tablet by mouth daily as needed.     metaxalone (SKELAXIN) 400 MG tablet Take 0.5-1 tablets (200-400 mg total) by mouth at bedtime. 30 tablet 2   ondansetron (ZOFRAN) 4 MG tablet Take 4 mg by mouth every 8  (eight) hours as needed.     pantoprazole (PROTONIX) 40 MG tablet Take 1 tablet (40 mg total) by mouth daily. 30 tablet 6   Potassium Chloride ER 20 MEQ TBCR TAKE 2 TABLETS (40 MEQ TOTAL) BY MOUTH 2 (TWO) TIMES DAILY WITH MEALS FOR 30 DAYS (Patient taking differently: Take 20 mEq by mouth daily.) 540 tablet 2   sodium chloride 1 g tablet Take 1 g by mouth 2 (two) times daily with a meal.     sulfamethoxazole-trimethoprim (BACTRIM DS) 800-160 MG tablet Take 1 tablet by mouth 2 (two) times daily. 14 tablet 0   No current facility-administered medications for this visit.    Family History Family History  Problem Relation Age of Onset   Throat cancer Father    Heart attack Maternal Grandmother    Ovarian cancer Maternal Grandmother    Stomach cancer Maternal Grandmother    Heart disease Maternal Grandmother    Prostate cancer Maternal Grandfather       Social History Social History   Tobacco Use   Smoking status: Former    Types: Cigarettes    Quit date: 2015    Years since quitting: 8.3   Smokeless tobacco: Never  Vaping Use   Vaping Use: Every day  Substance  Use Topics   Alcohol use: Not Currently    Comment: over 2 years ago   Drug use: Yes    Types: Marijuana    Comment: smokes daily per patient           Physical Exam Blood pressure 118/81, pulse 75, temperature 98 F (36.7 C), temperature source Oral, height '5\' 8"'$  (1.727 m), weight 123 lb (55.8 kg), SpO2 97 %. Last Weight  Most recent update: 04/05/2022  3:46 PM    Weight  55.8 kg (123 lb)             CONSTITUTIONAL: Wheelchair-bound, thin with left-sided neurological deficit, otherwise bright, pleasant appreciative and very cooperative along with being appropriately responsive and aware without distress.   EYES: Sclera non-icteric.   EARS, NOSE, MOUTH AND THROAT: Mask worn.    Hearing is intact to voice.  NECK: Trachea is midline, and there is no jugular venous distension.  LYMPH NODES:  Lymph nodes in  the neck are not enlarged. MUSCULOSKELETAL: Significant motor dysfunction/spasticity of the left side. SKIN: Skin turgor is normal. No pathologic skin lesions appreciated.  There is a cyst anterior to his right ear, there is another much smaller cyst on the cephalad aspect of his right buttock.  There is a punctate opening draining a fluctuant cavity of about 1-1/2 to 2 cm in the sacrococcygeal area.  We proceeded to open this area more widely with an 11 blade, I then cauterized the proud flesh within the cavity with silver nitrate.  I packed this incision open with half-inch packing strip and applied a cover dressing to it.   I find no pits consistent with a pilonidal etiology.  The skin appears quite healthy without evidence of any pressure deterioration. NEUROLOGIC:  Motor and sensation appear grossly abnormal.  He seems to function looks much better with his right side  PSYCH:  Alert and oriented to person, place and time. Affect is appropriate for situation.  Gracious gentleman.  Data Reviewed I have personally reviewed what is currently available of the patient's imaging, recent labs and medical records.   Labs:     Latest Ref Rng & Units 02/24/2022    1:46 PM 01/06/2022    2:38 PM 12/20/2021    1:50 PM  CBC  WBC 3.4 - 10.8 x10E3/uL 2.5   2.9   3.4    Hemoglobin 13.0 - 17.7 g/dL 12.7   12.9   11.9    Hematocrit 37.5 - 51.0 % 35.3   37.5   32.7    Platelets 150 - 450 x10E3/uL 141   109   159        Latest Ref Rng & Units 02/24/2022    1:36 PM 01/06/2022    2:38 PM 12/20/2021    1:50 PM  CMP  Glucose 70 - 99 mg/dL 107   100   94    BUN 6 - 24 mg/dL '12   8   13    '$ Creatinine 0.76 - 1.27 mg/dL 0.91   0.77   0.99    Sodium 134 - 144 mmol/L 138   136   135    Potassium 3.5 - 5.2 mmol/L 4.5   4.5   4.4    Chloride 96 - 106 mmol/L 103   103   102    CO2 20 - 29 mmol/L '24   23   21    '$ Calcium 8.7 - 10.2 mg/dL 8.7   9.1   8.6  Total Protein 6.0 - 8.5 g/dL 6.0      Total Bilirubin 0.0 - 1.2  mg/dL 0.6      Alkaline Phos 44 - 121 IU/L 76      AST 0 - 40 IU/L 21      ALT 0 - 44 IU/L 14          Imaging:  Within last 24 hrs: No results found.  Assessment    Incision and drainage of spontaneous draining sacral abscess. Patient Active Problem List   Diagnosis Date Noted   Fall 12/22/2021   Pilonidal cyst 12/13/2021   Depression, recurrent ([place]) 12/01/2021   Diarrhea 11/18/2021   Nausea 11/18/2021   Unspecified convulsions ([place]) 09/29/2021   Urinary frequency 08/17/2021   Chronic pain syndrome 03/15/2021   Pharmacologic therapy 03/15/2021   Disorder of skeletal system 03/15/2021   Problems influencing health status 03/15/2021   Long term prescription benzodiazepine use (Clob[place]am) 03/15/2021   History of thoracic spinal fusion (T3 thru T7) 03/15/2021   Anxiety 03/14/2021   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[place]) 01/21/2021   Epilepsy ([place]) 01/21/2021   Hyponatremia 12/22/2020   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  We will have him follow-up in 2 days for wound care.  Face-to-face time spent with the patient and accompanying care providers(if present) was 30 minutes, with more than 50% of the time spent counseling, educating, and coordinating care of the patient.    These notes generated with voice recognition software. I apologize for typographical errors.  Ronny Bacon M.D., FACS 04/05/2022, 4:17 PM

## 2022-04-05 NOTE — Patient Instructions (Signed)
Please see your follow up appointment listed below.  °

## 2022-04-07 ENCOUNTER — Ambulatory Visit (INDEPENDENT_AMBULATORY_CARE_PROVIDER_SITE_OTHER): Payer: Medicare (Managed Care) | Admitting: Surgery

## 2022-04-07 ENCOUNTER — Ambulatory Visit: Payer: Medicare (Managed Care) | Admitting: Speech Pathology

## 2022-04-07 ENCOUNTER — Ambulatory Visit: Payer: Medicare (Managed Care)

## 2022-04-07 ENCOUNTER — Encounter: Payer: Self-pay | Admitting: Family Medicine

## 2022-04-07 ENCOUNTER — Encounter: Payer: Self-pay | Admitting: Surgery

## 2022-04-07 VITALS — BP 116/84 | HR 76 | Ht 68.0 in | Wt 123.0 lb

## 2022-04-07 DIAGNOSIS — L03319 Cellulitis of trunk, unspecified: Secondary | ICD-10-CM

## 2022-04-07 DIAGNOSIS — L02219 Cutaneous abscess of trunk, unspecified: Secondary | ICD-10-CM

## 2022-04-07 NOTE — Assessment & Plan Note (Signed)
See discussion under back pain. Will check x-rays and treat. Call with any concerns.

## 2022-04-07 NOTE — Patient Instructions (Signed)
Repack the area every day. You may slightly wet the packing with saline so it goes in easier. You may change the top dressing more than once a day if it becomes saturated. If the packing is painful to pull out you may wet it with saline first.   Follow up here in 2 weeks. Call with any questions.  Watch out for any signs of infection, increasing redness, increasing pain, thick yellow discharge with a bad odor, fever over 100.

## 2022-04-07 NOTE — Assessment & Plan Note (Signed)
Will get him started on bactrim and get him into surgery ASAP. Await their input.

## 2022-04-07 NOTE — Progress Notes (Addendum)
They report they are pleased with how the area looks.  There was not any additional bleeding after the initial incision.  They have changed the cover dressing 3 times.  They deny any odor to the wound.  Tomorrow will be the last dose of antibiotics.  Returns today primarily to evaluate wound and give instructions to caregiver on wound care.  He has done well, his wound remained stable.  There is some residual exudative debris adherent to the lining of the parasacral abscess.  With topical 2% lidocaine jelly I attempted excisional debridement of this exudate in the soft tissues, skin and subcutaneous tissues.  This was completed.  The wound apparently measures 2 cm from cephalad to caudad, there is some caudal undermining of approximately 1 cm.  The depth is approximately 1 cm.  The skin appears healthy.  We then demonstrated wound packing to his caregiver, and I believe she will be competent to provide this daily over the next couple weeks.  She does want follow-up to ensure that she is doing it adequately and we will see him back in 2 to 3 weeks.  I see no role for additional antibiotics.

## 2022-04-12 ENCOUNTER — Ambulatory Visit: Payer: Medicare (Managed Care)

## 2022-04-12 ENCOUNTER — Ambulatory Visit: Payer: Medicare (Managed Care) | Admitting: Speech Pathology

## 2022-04-12 DIAGNOSIS — R471 Dysarthria and anarthria: Secondary | ICD-10-CM

## 2022-04-12 DIAGNOSIS — M6281 Muscle weakness (generalized): Secondary | ICD-10-CM | POA: Diagnosis not present

## 2022-04-12 DIAGNOSIS — I69923 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: Secondary | ICD-10-CM

## 2022-04-12 DIAGNOSIS — R41841 Cognitive communication deficit: Secondary | ICD-10-CM

## 2022-04-12 DIAGNOSIS — R278 Other lack of coordination: Secondary | ICD-10-CM

## 2022-04-13 NOTE — Therapy (Signed)
[place] MAIN [facility] SERVICES [address] [place], [place], [address] Phone: [phone]   Fax:  [phone]  Occupational Therapy Treatment  Patient Details  Name: Cory Hayes MRN: [id number] Date of Birth: [birth year] Referring Provider (OT): Cheng-Ja Southerland   Encounter Date: 04/12/2022   OT End of Session - 04/13/22 1238     Visit Number 3    Number of Visits 24    Date for OT Re-Evaluation 06/23/22    Authorization Time Period progress reporting period starting 03/31/2022    OT Start Time 1445    OT Stop Time 1515    OT Time Calculation (min) 30 min    Activity Tolerance Patient tolerated treatment well    Behavior During Therapy [facility] for tasks assessed/performed             Past Medical History:  Diagnosis Date   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[place]) 2008   Seizures [facility])     Past Surgical History:  Procedure Laterality Date   BACK SURGERY  2017   CRANIOTOMY  2008, 2015, 12/21/20    There were no vitals filed for this visit.   Subjective Assessment - 04/12/22 1233     Subjective  Pt reports being very tired today after being up a lot last night.    Patient is accompanied by: Family member    Pertinent History Pt. is a 46 y.o. 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 Pt. underwent  a craniotomy in 2015, and a resection in 12/2020. Pt. has received chemotherapy, and radiation. Pt. reports sustaining a fracture of the thoracic vertebrae in 2017. Pt. endorses a history of nmultiple falls, reporting approximately 20 in the last 6 months with his last fall being 2 days ago. Pt. resides with his mother, and18 y.o. son who assist pt. with daily care needs. Pt. enjoys reading, wathcing YouTube, cooking, and spending time with his family.    Patient Stated Goals To improve self-care    Currently in Pain? Yes    Pain Score 9      Pain Location Back    Pain Orientation Mid    Pain Descriptors / Indicators Aching    Pain Type Chronic pain    Pain Onset More than a month ago    Pain Frequency Constant    Aggravating Factors  sitting up    Pain Relieving Factors laying down    Effect of Pain on Daily Activities poor activity tolerance    Multiple Pain Sites Yes    Pain Score 7    Pain Location Arm    Pain Orientation Left    Pain Descriptors / Indicators Aching;Sore;Sharp    Pain Type Acute pain    Pain Onset In the past 7 days    Pain Frequency Constant            Occupational Therapy Treatment: Therapeutic Exercise: Mod A transfer wc<>mat table, mod A sit<>supine.  In supine, performed gentle bilat passive shoulder retraction and L shoulder depression stretch.  Instructed pt in BUE AAROM for increasing LUE strength.  Pt completed bilat chest press, shoulder flex, elbow flex, tricep extension at shoulder level, horiz abd/add, ER to top of head.  OT provided support to LUE through all ranges noted above, and provided initial demo and vc and tactile cues throughout for adjusting positioning and technique.  Handout printed for home program and issued to  mother.  Educated mother on need to support LUE throughout these exercises.  Mother verbalized understanding and was present for all.   Response to Treatment: Shortened tx session this day d/t pt having to use the bathroom upon OT arrival.  Mother assisted.  Performed exercises in supine this day d/t pt reporting 9/10 pain in back sitting up in wc, and much less in supine.  Pt tolerated all therapeutic exercises well this day.  Mother and pt eager to perform exercises at home, so handout issued this day and OT provided instruction for technique throughout session.  OT provided multiple cues on LUE positioning to prevent injury when pt was up in chair, as pt often had L arm hanging.  Pt will continue to benefit from skilled OT for increasing functional use of LUE and  maximizing indep with ADLs.    OT Education - 04/12/22 1237     Education Details BUE AAROM with cane in supine    Person(s) Educated Patient;Parent(s)    Methods Explanation;Demonstration;Verbal cues;Tactile cues;Handout    Comprehension Verbalized understanding;Returned demonstration;Verbal cues required;Need further instruction;Tactile cues required              OT Short Term Goals - 03/31/22 1942       OT SHORT TERM GOAL #1   Title Pt. will improve FOTO to reflect pt. perceived progress with assessment specific ADLs, and IADL tasks.    Baseline Eval: TBD    Time 12    Period Weeks    Status New    Target Date 05/12/22               OT Long Term Goals - 03/31/22 1944       OT LONG TERM GOAL #1   Title Pt. will require minA UE dressing skills    Baseline Eval: MaxA    Time 12    Period Weeks    Status New    Target Date 06/23/22      OT LONG TERM GOAL #2   Title Pt. will require modA LE dressing skills    Baseline Eval: MaxA    Time 12    Period Weeks    Status New    Target Date 06/23/22      OT LONG TERM GOAL #3   Title Pt. will demonstrate transfers to the [place] with minA    Baseline Eval: [place], when fatigued MaxA    Time 12    Period Weeks    Status New    Target Date 06/23/22      OT LONG TERM GOAL #4   Title Pt. will be independent with a HEP for the LUE    Baseline Eval: No current HEPs    Time 12    Period Weeks    Status New    Target Date 06/23/22      OT LONG TERM GOAL #5   Title Pt. will demonstrate visual compensatory strategies 100% of the time during ADLs, and IADLs    Baseline Eval: Currently does not utilize    Time 12    Period Weeks    Status New    Target Date 06/23/22      Long Term Additional Goals   Additional Long Term Goals Yes      OT LONG TERM GOAL #6   Title Pt. will improve left grip strength by 5#  in order to be able to hold, and stabilize items with ADLS.    Baseline Eval: eft  grip strength: 17#     Time 12    Period Weeks    Status New    Target Date 06/23/22      OT LONG TERM GOAL #7   Title pt. will improve left lateral pinch strength by 3# to be able to hold, and stabilize a toothbush.    Baseline Eval: Left pinch strength 6#    Time 12    Period Weeks    Status New    Target Date 06/23/22              Plan - 04/12/22 1355     Clinical Impression Statement Shortened tx session this day d/t pt having to use the bathroom upon OT arrival.  Mother assisted.  Performed exercises in supine this day d/t pt reporting 9/10 pain in back sitting up in wc, and much less in supine.  Pt tolerated all therapeutic exercises well this day.  Mother and pt eager to perform exercises at home, so handout issued this day and OT provided instruction for technique throughout session.  OT provided multiple cues on LUE positioning to prevent injury when pt was up in chair, as pt often had L arm hanging.  Pt will continue to benefit from skilled OT for increasing functional use of LUE and maximizing indep with ADLs.    OT Occupational Profile and History Comprehensive Assessment- Review of records and extensive additional review of physical, cognitive, psychosocial history related to current functional performance    Occupational performance deficits (Please refer to evaluation for details): IADL's;ADL's;Social Participation    Body Structure / Function / Physical Skills ADL;FMC;Endurance;Strength;ROM    Rehab Potential Good    Clinical Decision Making Multiple treatment options, significant modification of task necessary    Comorbidities Affecting Occupational Performance: Presence of comorbidities impacting occupational performance    Modification or Assistance to Complete Evaluation  Max significant modification of tasks or assist is necessary to complete    OT Frequency 2x / week    OT Duration 12 weeks    OT Treatment/Interventions Self-care/ADL training;Therapeutic exercise;Energy  conservation;Neuromuscular education;DME and/or AE instruction;Moist Heat;Therapeutic activities;Patient/family Educational psychologist;Contrast Bath;Passive range of motion;Cognitive remediation/compensation;Manual Therapy;Coping strategies training    Consulted and Agree with Plan of Care Family member/caregiver    Family Member Consulted Mother             Patient will benefit from skilled therapeutic intervention in order to improve the following deficits and impairments:   Body Structure / Function / Physical Skills: ADL, [place], Endurance, Strength, ROM       Visit Diagnosis: Muscle weakness (generalized)  Other lack of coordination    Problem List Patient Active Problem List   Diagnosis Date Noted   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   Cellulitis and abscess of trunk 04/05/2022   Fall 12/22/2021   Pilonidal cyst 12/13/2021   Depression, recurrent ([place]) 12/01/2021   Diarrhea 11/18/2021   Unspecified convulsions ([place]) 09/29/2021   Chronic pain syndrome 03/15/2021   Pharmacologic therapy 03/15/2021   Disorder of skeletal system 03/15/2021   Problems influencing health status 03/15/2021   Long term prescription benzodiazepine use ([place]) 03/15/2021   History of thoracic spinal fusion (T3 thru T7) 03/15/2021   Anxiety 03/14/2021   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[place]) 01/21/2021   Epilepsy ([place]) 01/21/2021   Hyponatremia 12/22/2020   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

## 2022-04-13 NOTE — Therapy (Signed)
[place] MAIN [facility] SERVICES [address] [place], [place], [address] Phone: [phone]   Fax:  [phone]  Speech Language Pathology Treatment  Patient Details  Name: Cory Hayes MRN: [id number] Date of Birth: June 20, 1976 No data recorded  Encounter Date: 04/12/2022   End of Session - 04/13/22 2025     Visit Number 2    Number of Visits 13    Date for SLP Re-Evaluation 07/04/22    SLP Start Time 4270    SLP Stop Time  1600    SLP Time Calculation (min) 45 min    Activity Tolerance Patient tolerated treatment well             Past Medical History:  Diagnosis Date   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[place]) 2008   Seizures [facility])     Past Surgical History:  Procedure Laterality Date   BACK SURGERY  2017   CRANIOTOMY  2008, 2015, 12/21/20    There were no vitals filed for this visit.          ADULT SLP TREATMENT - 04/13/22 0926       General Information   Behavior/Cognition Alert;Pleasant mood;Cooperative    HPI Cory Hayes is a 46 y.o. 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 Underwent craniotomy in 2015; also s/p radiation, chemotherapy, and was admitted at [facility] for right parietal crani and tumor resection on 12/21/20. Post-operatively was evaluated for dysphagia by SLP, FEES on 12/24/20 with recommendation for NDD3/nectar liquids. Pt advanced to regular/thin by time of d/c from rehab.      Treatment Provided   Treatment provided Cognitive-Linquistic      Pain Assessment   Pain Assessment No/denies pain      Cognitive-Linquistic Treatment   Treatment focused on Cognition;Dysarthria    Skilled Treatment Completed cognitive testing and motor speech assessment. See details below.      Assessment / Recommendations / Plan   Plan Continue with current plan of care      Progression Toward Goals   Progression toward goals Progressing toward goals             STANDARDIZED ASSESSMENTS: Addenbrooke's Cognitive Examination - ACE III The Addenbrooke's Cognitive Examination-III (ACE-III) is a brief cognitive test that assesses five cognitive domains. The total score is 100 with higher scores indicating better cognitive functioning. Cut off scores of 88 and 82 are recommended for suspicion of dementia (88 has sensitivity of 1.00 and specificity of 0.96, 82 has sensitivity of 0.93 and specificity of 1.00). American Version B   Attention 11/18  Memory 12/26  Fluency 8/14  Language 22/26  Visuospatial 8/16  TOTAL ACE- III Score 66/100   Motor Speech evaluation Maximum phonation time for sustained "ah": 12.9 Mean intensity during sustained "ah": 69 dB  Mean fundamental frequency during sustained "ah": 138 Hz (0.3 STD below average of 145 Hz +/- 23 for age and gender) Mean intensity sustained during conversational speech: 61 dB Habitual pitch: 121 Hz Highest dynamic pitch when altering pitch from a low note to a high note: 177 Hz Lowest dynamic pitch when altering from a high note to a low note: 97 Hz Highest dynamic pitch during conversational speech: 366 Hz Lowest dynamic pitch during conversational speech: 62 Hz  Speech is characterized by Imprecise consonants, Irregular articulatory breakdowns, Dysfluency, Hypophonia, and Other: reduced breath support. For trained listener in quiet environment with context, intelligibility was 75%.   Patient able to improve all  parameters with model ("Loud like me") Stimulability: Improved vocal quality with loud voice (75 dB) for sustained vowel, able to increase intensity in short conversational responses with model and mod cues.       SLP Education - 04/13/22 [phone]     Education Details LOUD voice, posture/breath support    Person(s) Educated Patient;Parent(s)    Methods Explanation;Verbal cues    Comprehension Verbalized understanding;Verbal cues required              SLP Short Term Goals  - 04/05/22 1801       SLP SHORT TERM GOAL #1   Title Pt will completed standardized cognitive assessment and assessment of motor speech.    Time 2    Period Weeks    Status New      SLP SHORT TERM GOAL #2   Title Pt will demonstrate HEP for motor speech >90% accuracy with min cues.    Time 6    Period Weeks    Status New      SLP SHORT TERM GOAL #3   Title Pt will initiate use of memory support system (external aid) and bring to 75% of therapy sessions.    Time 6    Period Weeks    Status New      SLP SHORT TERM GOAL #4   Title Patient will use intelligibility and fluency compensations in 5 minutes simple-mod complex conversation with rare min cues.    Time 6    Period Weeks    Status New              SLP Long Term Goals - 04/05/22 1805       SLP LONG TERM GOAL #1   Title Patient will report carryover of at least 3 memory strategies outside of ST.    Time 12    Period Weeks    Status New      SLP LONG TERM GOAL #2   Title Patient will increase vocal intensity in conversation to average >72 dB at 50 cm.    Baseline To be collected    Time 12    Period Weeks    Status New      SLP LONG TERM GOAL #3   Title Pt will use memory support system (external aid) to recall completed and upcoming appointments and birthdays >90% accuracy independently.    Time 12    Period Weeks    Status New      SLP LONG TERM GOAL #4   Title Pt will use intelligibility and fluency compensations effectively >90% of the time in 15 minutes mod complex- complex conversation.    Time 12    Period Weeks    Status New              Plan - 04/13/22 1751     Clinical Impression Statement Patient presents with moderate cognitive communication impairments, stuttering and mild dysarthria; these appear to be chronic impairments exacerbated by most recent craniotomy and tumor resection in February 2022. Presents with deficits in word recall, attention, and short-term memory, as seen in  cognitive examination and per pt report (difficulty losing/misplacing items). Impairments in attention and memory affect Pts higher level cognitive functioning skills. Pts demonstrates low vocal intensity with reduced breath support. Decreased volume greatly impacts intelligibility and causes communication breakdowns with family, as reported by Pt. Pt had weak postural support which worsened with concentration on cognitive tasks and affected voice intensity and intelligibility. Recommend ST to  train Pt in compensatory cognitive strategies for memory, attention, and recall as well as exercises to improve vocal strength and breath support as well to improve communication with family.    Speech Therapy Frequency 1x /week    Duration 12 weeks    Treatment/Interventions Cognitive reorganization;Compensatory strategies;Multimodal communcation approach;Internal/external aids;Functional tasks;SLP instruction and feedback;Patient/family education    Potential to Achieve Goals Good    Potential Considerations Other (comment)   time post onset   Consulted and Agree with Plan of Care Patient;Family member/caregiver             Patient will benefit from skilled therapeutic intervention in order to improve the following deficits and impairments:   Cognitive communication deficit  Dysarthria and anarthria  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    Problem List Patient Active Problem List   Diagnosis Date Noted   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   Cellulitis and abscess of trunk 04/05/2022   Fall 12/22/2021   Pilonidal cyst 12/13/2021   Depression, recurrent ([place]) 12/01/2021   Diarrhea 11/18/2021   Unspecified convulsions ([place]) 09/29/2021   Chronic pain syndrome 03/15/2021   Pharmacologic therapy 03/15/2021   Disorder of skeletal system 03/15/2021   Problems influencing health status 03/15/2021   Long term prescription benzodiazepine use ([place]) 03/15/2021   History of  thoracic spinal fusion (T3 thru T7) 03/15/2021   Anxiety 03/14/2021   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[place]) 01/21/2021   Epilepsy ([place]) 01/21/2021   Hyponatremia 12/22/2020   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 [place], [place], [address] Phone: [phone]   Fax:  [phone]   Name: Zohaib Heeney MRN: [id number] Date of Birth: 02-Sep-1976

## 2022-04-14 ENCOUNTER — Ambulatory Visit: Payer: Medicare (Managed Care)

## 2022-04-16 ENCOUNTER — Telehealth: Payer: Self-pay | Admitting: Internal Medicine

## 2022-04-16 DIAGNOSIS — R110 Nausea: Secondary | ICD-10-CM

## 2022-04-16 DIAGNOSIS — C719 Malignant neoplasm of brain, unspecified: Secondary | ICD-10-CM

## 2022-04-16 DIAGNOSIS — R197 Diarrhea, unspecified: Secondary | ICD-10-CM

## 2022-04-18 NOTE — Telephone Encounter (Signed)
Requested Prescriptions  Pending Prescriptions Disp Refills  . lactobacillus acidophilus & bulgar (LACTINEX) chewable tablet [Pharmacy Med Name: LACTINEX CHEWABLE TABLET] 50 tablet 1    Sig: CHEW 1 TABLET BY MOUTH AS NEEDED (TAKE IT AFTER EVERY STOOLS).     Endocrinology:  Nutritional Agents Passed - 04/16/2022  1:26 AM      Passed - Valid encounter within last 12 months    Recent Outpatient Visits          2 weeks ago Pilonidal cyst   [place], Megan P, DO   3 months ago Pilonidal cyst   [place] Vigg, Avanti, MD   4 months ago Pilonidal cyst   [place], Henrine Screws T, NP   4 months ago Depression, recurrent ([place])   [facility] Vigg, Avanti, MD   5 months ago Diarrhea, unspecified type   [place], MD      Future Appointments            In 2 months [place], [place]

## 2022-04-19 ENCOUNTER — Ambulatory Visit: Payer: Medicare (Managed Care) | Attending: Nurse Practitioner | Admitting: Speech Pathology

## 2022-04-19 ENCOUNTER — Ambulatory Visit: Payer: Medicare (Managed Care) | Admitting: Occupational Therapy

## 2022-04-19 DIAGNOSIS — R41841 Cognitive communication deficit: Secondary | ICD-10-CM | POA: Diagnosis not present

## 2022-04-19 DIAGNOSIS — M6281 Muscle weakness (generalized): Secondary | ICD-10-CM | POA: Insufficient documentation

## 2022-04-19 DIAGNOSIS — R278 Other lack of coordination: Secondary | ICD-10-CM | POA: Diagnosis not present

## 2022-04-19 DIAGNOSIS — R471 Dysarthria and anarthria: Secondary | ICD-10-CM | POA: Diagnosis not present

## 2022-04-19 NOTE — Therapy (Signed)
[place] MAIN [facility] SERVICES [address] [place], [place], [address] Phone: [phone]   Fax:  [phone]  Speech Language Pathology Treatment  Patient Details  Name: Cory Hayes MRN: [id number] Date of Birth: 24-Dec-1975 No data recorded  Encounter Date: 04/19/2022   End of Session - 04/19/22 1908     Visit Number 3    Number of Visits 13    Date for SLP Re-Evaluation 07/04/22    SLP Start Time 1600    SLP Stop Time  1700    SLP Time Calculation (min) 60 min    Activity Tolerance Patient tolerated treatment well             Past Medical History:  Diagnosis Date   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[place]) 2008   Seizures [facility])     Past Surgical History:  Procedure Laterality Date   BACK SURGERY  2017   CRANIOTOMY  2008, 2015, 12/21/20    There were no vitals filed for this visit.   Subjective Assessment - 04/19/22 1859     Subjective Pt reports 2 falls since last session    Patient is accompained by: Family member   mother   Currently in Pain? Yes    Pain Score 9     Pain Location Back                   ADULT SLP TREATMENT - 04/19/22 1900       General Information   Behavior/Cognition Alert;Pleasant mood;Cooperative    HPI Cory Hayes is a 46 y.o. 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 Underwent craniotomy in 2015; also s/p radiation, chemotherapy, and was admitted at [facility] for right parietal crani and tumor resection on 12/21/20. Post-operatively was evaluated for dysphagia by SLP, FEES on 12/24/20 with recommendation for NDD3/nectar liquids. Pt advanced to regular/thin by time of d/c from rehab.      Treatment Provided   Treatment provided Cognitive-Linquistic      Cognitive-Linquistic Treatment   Treatment focused on Cognition;Dysarthria    Skilled Treatment Given report of recent falls, SLP suggested use of bed alert system to prevent  falls due to pt attempting to get out of bed for toileting unassisted. Initiated training in home exercise program for motor speech. Pt required initial moderate cues (verbal, demonstration) for loud /a/ averaging 77dB at 50 cm. Cues necessary for breath support and vocal fade. Generated 10 personally relevant phrases for home practice with loudness to promote carryover. When reading phrases, patient required initial usual mod cues for increased vocal intensity; able to fade support to occasional min-mod A; pt used appropriate vocal intensity during second attempt 70% of the time (average 72 dB). In carryover task (word-level responses), pt required usual mod-max cues; initial responses averaged 62 dB, increased to 70 dB with cues.      Assessment / Recommendations / Plan   Plan Continue with current plan of care              SLP Education - 04/19/22 1907     Education Details HEP for motor speech, bed alarm for safety    Person(s) Educated Patient;Parent(s)    Methods Explanation    Comprehension Verbalized understanding              SLP Short Term Goals - 04/05/22 1801       SLP SHORT TERM GOAL #1   Title Pt will completed standardized cognitive  assessment and assessment of motor speech.    Time 2    Period Weeks    Status New      SLP SHORT TERM GOAL #2   Title Pt will demonstrate HEP for motor speech >90% accuracy with min cues.    Time 6    Period Weeks    Status New      SLP SHORT TERM GOAL #3   Title Pt will initiate use of memory support system (external aid) and bring to 75% of therapy sessions.    Time 6    Period Weeks    Status New      SLP SHORT TERM GOAL #4   Title Patient will use intelligibility and fluency compensations in 5 minutes simple-mod complex conversation with rare min cues.    Time 6    Period Weeks    Status New              SLP Long Term Goals - 04/05/22 1805       SLP LONG TERM GOAL #1   Title Patient will report carryover of  at least 3 memory strategies outside of ST.    Time 12    Period Weeks    Status New      SLP LONG TERM GOAL #2   Title Patient will increase vocal intensity in conversation to average >72 dB at 50 cm.    Baseline To be collected    Time 12    Period Weeks    Status New      SLP LONG TERM GOAL #3   Title Pt will use memory support system (external aid) to recall completed and upcoming appointments and birthdays >90% accuracy independently.    Time 12    Period Weeks    Status New      SLP LONG TERM GOAL #4   Title Pt will use intelligibility and fluency compensations effectively >90% of the time in 15 minutes mod complex- complex conversation.    Time 12    Period Weeks    Status New              Plan - 04/19/22 1908     Clinical Impression Statement Patient presents with moderate cognitive communication impairments, stuttering and mild dysarthria; these appear to be chronic impairments exacerbated by most recent craniotomy and tumor resection in February 2022. Impairments in attention and memory affect Pts higher level cognitive functioning skills. Pts demonstrates low vocal intensity with reduced breath support. Decreased volume greatly impacts intelligibility and causes communication breakdowns with family. Patient stimulable for improved intelligibility with intensity-based cues; increased cuing necessary with cognitive load. Recommend ST to train Pt in compensatory cognitive strategies for memory, attention, and recall as well as exercises to improve vocal strength and breath support as well to improve communication with family.    Speech Therapy Frequency 1x /week    Duration 12 weeks    Treatment/Interventions Cognitive reorganization;Compensatory strategies;Multimodal communcation approach;Internal/external aids;Functional tasks;SLP instruction and feedback;Patient/family education    Potential to Achieve Goals Good    Potential Considerations Other (comment)   time post  onset   Consulted and Agree with Plan of Care Patient;Family member/caregiver             Patient will benefit from skilled therapeutic intervention in order to improve the following deficits and impairments:   Dysarthria and anarthria  Cognitive communication deficit    Problem List Patient Active Problem List   Diagnosis Date Noted  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   Cellulitis and abscess of trunk 04/05/2022   Fall 12/22/2021   Pilonidal cyst 12/13/2021   Depression, recurrent ([place]) 12/01/2021   Diarrhea 11/18/2021   Unspecified convulsions ([place]) 09/29/2021   Chronic pain syndrome 03/15/2021   Pharmacologic therapy 03/15/2021   Disorder of skeletal system 03/15/2021   Problems influencing health status 03/15/2021   Long term prescription benzodiazepine use ([place]) 03/15/2021   History of thoracic spinal fusion (T3 thru T7) 03/15/2021   Anxiety 03/14/2021   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[place]) 01/21/2021   Epilepsy ([place]) 01/21/2021   Hyponatremia 12/22/2020   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

## 2022-04-19 NOTE — Patient Instructions (Addendum)
Home exercises: twice a day  1) Say "ah" with your loud, good quality voice (use your abs). Aim for effort level 7 or 8. Hold for as long as you can at a [place].   5 times  2) Read aloud from your list of 10 phrases in your loud, good quality voice.   I love you.   2.  Thank you!  3.  I'm sorry.  4.  Nehemiah, can you help me?  5.  Mom, I'm hungry.  6.  I'm thirsty.  7.  Can somebody help me?  8.  What did you do in school?  9. What's for dinner?  10. I'm trying!

## 2022-04-20 ENCOUNTER — Encounter: Payer: Self-pay | Admitting: Occupational Therapy

## 2022-04-20 NOTE — Therapy (Signed)
[place] MAIN [facility] SERVICES [address] [place], [place], [address] Phone: [phone]   Fax:  [phone]  Occupational Therapy Treatment  Patient Details  Name: Cory Hayes MRN: [id number] Date of Birth: 12/03/1975 Referring Provider (OT): Cheng-Ja Southerland   Encounter Date: 04/19/2022   OT End of Session - 04/20/22 0853     Visit Number 4    Number of Visits 24    Date for OT Re-Evaluation 06/23/22    Authorization Time Period progress reporting period starting 03/31/2022    OT Start Time 1515    OT Stop Time 1600    OT Time Calculation (min) 45 min    Activity Tolerance Patient tolerated treatment well    Behavior During Therapy [place] [facility] for tasks assessed/performed             Past Medical History:  Diagnosis Date   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[place]) 2008   Seizures [facility])     Past Surgical History:  Procedure Laterality Date   BACK SURGERY  2017   CRANIOTOMY  2008, 2015, 12/21/20    There were no vitals filed for this visit.   Subjective Assessment - 04/20/22 0852     Subjective  Pt reports having had several falls this weekend.   Patient is accompanied by: Family member    Pertinent History Pt. is a 46 y.o. 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 Pt. underwent  a craniotomy in 2015, and a resection in 12/2020. Pt. has received chemotherapy, and radiation. Pt. reports sustaining a fracture of the thoracic vertebrae in 2017. Pt. endorses a history of nmultiple falls, reporting approximately 20 in the last 6 months with his last fall being 2 days ago. Pt. resides with his mother, and47 y.o. son who assist pt. with daily care needs. Pt. enjoys reading, wathcing YouTube, cooking, and spending time with his family.    Patient Stated Goals To improve self-care    Currently in Pain? Yes    Pain Score 8     Pain Location  Back    Pain Orientation Mid    Pain Descriptors / Indicators Aching    Pain Onset More than a month ago            OT Treatment  Pt. and caregiver education was provided about positioning of the left UE, and proper ROM techniques for the LUE. Pt. tolerated slow prolonged gentle stretching of the bilateral pectoral musculature. Pt. performed A/AAAROM, and PROM to the LUE, wrist, and hand. Pt.'s caregiver was able to demonstrate proper technique  with verbal, and tactile  cues for pectoral, and LUE stretches. Pt. and caregiver were assisted with problem solving through night time safety options, including bed alarms, as pt. reports having 2 additional falls over the weekend when trying to transfer to the commode by himself.  Pt. Currently uses a whistle to notify his Mother, and son, however is reluctant to use it. Pt. Continues to work on improving Left UE functioning, and safety with ADLs, and IADLs.                      OT Education - 04/20/22 0853     Education Details BUE AAROM with cane in supine    Person(s) Educated Patient;Parent(s)    Methods Explanation;Demonstration;Verbal cues;Tactile cues;Handout    Comprehension Verbalized understanding;Returned demonstration;Verbal cues required;Need further instruction;Tactile cues required  OT Short Term Goals - 03/31/22 1942       OT SHORT TERM GOAL #1   Title Pt. will improve FOTO to reflect pt. perceived progress with assessment specific ADLs, and IADL tasks.    Baseline Eval: TBD    Time 12    Period Weeks    Status New    Target Date 05/12/22               OT Long Term Goals - 03/31/22 1944       OT LONG TERM GOAL #1   Title Pt. will require minA UE dressing skills    Baseline Eval: MaxA    Time 12    Period Weeks    Status New    Target Date 06/23/22      OT LONG TERM GOAL #2   Title Pt. will require modA LE dressing skills    Baseline Eval: MaxA    Time 12    Period  Weeks    Status New    Target Date 06/23/22      OT LONG TERM GOAL #3   Title Pt. will demonstrate transfers to the [place] with minA    Baseline Eval: [place], when fatigued MaxA    Time 12    Period Weeks    Status New    Target Date 06/23/22      OT LONG TERM GOAL #4   Title Pt. will be independent with a HEP for the LUE    Baseline Eval: No current HEPs    Time 12    Period Weeks    Status New    Target Date 06/23/22      OT LONG TERM GOAL #5   Title Pt. will demonstrate visual compensatory strategies 100% of the time during ADLs, and IADLs    Baseline Eval: Currently does not utilize    Time 12    Period Weeks    Status New    Target Date 06/23/22      Long Term Additional Goals   Additional Long Term Goals Yes      OT LONG TERM GOAL #6   Title Pt. will improve left grip strength by 5#  in order to be able to hold, and stabilize items with ADLS.    Baseline Eval: eft grip strength: 17#    Time 12    Period Weeks    Status New    Target Date 06/23/22      OT LONG TERM GOAL #7   Title pt. will improve left lateral pinch strength by 3# to be able to hold, and stabilize a toothbush.    Baseline Eval: Left pinch strength 6#    Time 12    Period Weeks    Status New    Target Date 06/23/22                   Plan - 04/20/22 0854     Clinical Impression Statement Pt. and caregiver education was provided about positioning of the left UE, and proper ROM techniques for the LUE. Pt. tolerated slow prolonged gentle stretching of the bilateral pectoral musculature. Pt. performed A/AAAROM, and PROM to the LUE, wrist, and hand. Pt.'s caregiver was able to demonstrate proper technique  with verbal, and tactile  cues for pectoral, and LUE stretches. Pt. and caregiver were assisted with problem solving through night time safety options, including bed alarms, as pt. reports having 2 additional falls over the weekend when  trying to transfer to the commode by himself.  Pt.  Currently uses a whistle to notify his Mother, and son, however is reluctant to use it. Pt. Continues to work on improving Left UE functioning, and safety with ADLs, and IADLs.     OT Occupational Profile and History Comprehensive Assessment- Review of records and extensive additional review of physical, cognitive, psychosocial history related to current functional performance    Occupational performance deficits (Please refer to evaluation for details): IADL's;ADL's;Social Participation    Body Structure / Function / Physical Skills ADL;FMC;Endurance;Strength;ROM    Rehab Potential Good    Clinical Decision Making Multiple treatment options, significant modification of task necessary    Comorbidities Affecting Occupational Performance: Presence of comorbidities impacting occupational performance    Modification or Assistance to Complete Evaluation  Max significant modification of tasks or assist is necessary to complete    OT Frequency 2x / week    OT Duration 12 weeks    OT Treatment/Interventions Self-care/ADL training;Therapeutic exercise;Energy conservation;Neuromuscular education;DME and/or AE instruction;Moist Heat;Therapeutic activities;Patient/family Educational psychologist;Contrast Bath;Passive range of motion;Cognitive remediation/compensation;Manual Therapy;Coping strategies training    Consulted and Agree with Plan of Care Family member/caregiver    Family Member Consulted Mother             Patient will benefit from skilled therapeutic intervention in order to improve the following deficits and impairments:   Body Structure / Function / Physical Skills: ADL, [place], Endurance, Strength, ROM       Visit Diagnosis: Muscle weakness (generalized)  Other lack of coordination    Problem List Patient Active Problem List   Diagnosis Date Noted   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   Cellulitis and abscess of trunk 04/05/2022   Fall 12/22/2021   Pilonidal cyst 12/13/2021    Depression, recurrent ([place]) 12/01/2021   Diarrhea 11/18/2021   Unspecified convulsions ([place]) 09/29/2021   Chronic pain syndrome 03/15/2021   Pharmacologic therapy 03/15/2021   Disorder of skeletal system 03/15/2021   Problems influencing health status 03/15/2021   Long term prescription benzodiazepine use ([place]) 03/15/2021   History of thoracic spinal fusion (T3 thru T7) 03/15/2021   Anxiety 03/14/2021   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[place]) 01/21/2021   Epilepsy ([place]) 01/21/2021   Hyponatremia 12/22/2020   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

## 2022-04-21 ENCOUNTER — Ambulatory Visit: Payer: Medicare (Managed Care) | Admitting: Occupational Therapy

## 2022-04-21 ENCOUNTER — Other Ambulatory Visit: Payer: Self-pay

## 2022-04-21 ENCOUNTER — Ambulatory Visit: Payer: Medicare (Managed Care)

## 2022-04-21 ENCOUNTER — Encounter: Payer: Self-pay | Admitting: Surgery

## 2022-04-21 ENCOUNTER — Ambulatory Visit (INDEPENDENT_AMBULATORY_CARE_PROVIDER_SITE_OTHER): Payer: Medicare (Managed Care) | Admitting: Surgery

## 2022-04-21 VITALS — BP 108/75 | HR 69 | Temp 98.1°F | Ht 68.0 in | Wt 123.0 lb

## 2022-04-21 DIAGNOSIS — Z09 Encounter for follow-up examination after completed treatment for conditions other than malignant neoplasm: Secondary | ICD-10-CM

## 2022-04-21 DIAGNOSIS — L02219 Cutaneous abscess of trunk, unspecified: Secondary | ICD-10-CM | POA: Diagnosis not present

## 2022-04-21 DIAGNOSIS — L0591 Pilonidal cyst without abscess: Secondary | ICD-10-CM

## 2022-04-21 NOTE — Patient Instructions (Signed)
Pilonidal Cyst  A pilonidal cyst is a fluid-filled sac that forms beneath the skin near the tailbone, at the top of the crease of the buttocks (pilonidal area). If the cyst is not large and not infected, it may not cause any problems. If the cyst becomes irritated or infected, it may get larger and fill with pus. An infected cyst is called an abscess. A pilonidal abscess may cause pain and swelling, and it may need to be drained or removed. What are the causes? The cause of this condition is not always known. In some cases, a hair that grows into your skin (ingrown hair) may be the cause. What increases the risk? You are more likely to get a pilonidal cyst if you: Are male. Have lots of hair near the crease of the buttocks. Are overweight. Have a dimple near the crease of the buttocks. Wear tight clothing. Do not bathe or shower often. Sit for long periods of time. What are the signs or symptoms? Signs and symptoms of a pilonidal cyst may include pain, swelling, redness, and warmth in the pilonidal area. Depending on how big the cyst is, you may be able to feel a lump near your tailbone. If your cyst becomes infected, symptoms may include: Pus or fluid drainage. Fever. Pain, swelling, and redness getting worse. The lump getting bigger. How is this diagnosed? This condition may be diagnosed based on: Your symptoms and medical history. A physical exam. A blood test to check for infection. Testing a pus sample, if applicable. How is this treated? If your cyst does not cause symptoms, you may not need any treatment. If your cyst bothers you or is infected, you may need a procedure to drain or remove the cyst. Depending on the size, location, and severity of your cyst, your health care provider may: Make an incision in the cyst and drain it (incision and drainage). Open and drain the cyst, and then stitch the wound so that it stays open while it heals (marsupialization). You will be given  instructions about how to care for your open wound while it heals. Remove all or part of the cyst, and then close the wound (cyst removal). You may need to take antibiotic medicines before your procedure. Follow these instructions at home: Medicines Take over-the-counter and prescription medicines only as told by your health care provider. If you were prescribed an antibiotic medicine, take it as told by your health care provider. Do not stop taking the antibiotic even if you start to feel better. General instructions Keep the area around your pilonidal cyst clean and dry. If there is fluid or pus draining from your cyst: Cover the area with a clean bandage (dressing) as needed. Wash the area gently with soap and water. Pat the area dry with a clean towel. Do not rub the area because that may cause bleeding. Remove hair from the area around the cyst only if your health care provider tells you to do this. Do not wear tight pants or sit in one position for long periods at a time. Keep all follow-up visits as told by your health care provider. This is important. Contact a health care provider if you have: New redness, swelling, or pain. A fever. Severe pain. Summary A pilonidal cyst is a fluid-filled sac that forms beneath the skin near the tailbone, at the top of the crease of the buttocks (pilonidal area). If the cyst becomes irritated or infected, it may get larger and fill with pus. An  infected cyst is called an abscess. The cause of this condition is not always known. In some cases, a hair that grows into your skin (ingrown hair) may be the cause. If your cyst does not cause symptoms, you may not need any treatment. If your cyst bothers you or is infected, you may need a procedure to drain or remove the cyst. This information is not intended to replace advice given to you by your health care provider. Make sure you discuss any questions you have with your health care provider. Document  Revised: 09/10/2020 Document Reviewed: 09/10/2020 Elsevier Patient Education  [place].

## 2022-04-21 NOTE — Progress Notes (Signed)
They report they are performing daily dressing changes usually in the evenings.   They deny any odor to the wound.   Returns today primarily to evaluate wound and give instructions to caregiver on wound care.  He has done well, his wound remained stable.  There is some residual exudative discharge on the packing strip that was within the dressing but not in the wound upon dressing removal.  No repeat excisional debridement was attempted.  The wound apparently measures 2 cm from cephalad to caudad, there is some caudal undermining of approximately 1 cm.  The depth is approximately 1 cm.  Wound size has not diminished much, if at all.  The skin appears healthy.  We then demonstrated wound packing to his caregiver, and I believe she will be competent to provide this at least twice weekly over the next couple months.  We have changed over to Aquacel Ag rope packing, which I believe will aid in diminishing frequency of wound care and assist in healing.  She does want follow-up to ensure that she is doing it adequately and we will see him back in 2 months.

## 2022-04-25 ENCOUNTER — Ambulatory Visit: Payer: Medicare (Managed Care) | Admitting: Occupational Therapy

## 2022-04-25 ENCOUNTER — Ambulatory Visit: Payer: Self-pay | Admitting: *Deleted

## 2022-04-25 NOTE — Telephone Encounter (Signed)
Per agent: "Pts mother is calling to report that pt has diarhea. Pt previously had this dx and was prescribed lactobacillus acidophilus & bulgar (LACTINEX) chewable tablet [[id number]] . Wanting to know if needing to return to lactobacillus acidophilus & bulgar (LACTINEX) chewable tablet [[id number]] . Please advise "   Chief Complaint: Diarrhea Symptoms: "Mostly liquid stools, some clumping" 4 episodes in 24 hours.Completed course of antibiotics 04/08/22. Increased fatigue "But had round of radiation recently." Urine dark in AMs, "Better during day as he drinks."  Frequency: Saturday Pertinent Negatives: Patient denies abdominal pain,fever Disposition: '[]'$ ED /'[]'$ Urgent Care (no appt availability in office) / '[x]'$ Appointment(In office/virtual)/ '[]'$  [place] [facility]/ '[]'$ Home Care/ '[]'$ Refused Recommended Disposition /'[]'$ [place] [facility]/ '[]'$  Follow-up with PCP Additional Notes: Appt secured for tomorrow per pts schedule. Mother requesting labs as well, "If due, last in April at [facility]." States they initiated [employer]. [place] if pt should start   lactobacillus acidophilus & bulgar (LACTINEX) chewable tablet as prescribed previously.  Reason for Disposition  [1] MODERATE diarrhea (e.g., 4-6 times / day more than normal) AND [2] present > 48 hours (2 days)  Answer Assessment - Initial Assessment Questions 1. DIARRHEA SEVERITY: "How bad is the diarrhea?" "How many more stools have you had in the past 24 hours than normal?"    - NO DIARRHEA (SCALE 0)   - MILD (SCALE 1-3): Few loose or mushy BMs; increase of 1-3 stools over normal daily number of stools; mild increase in ostomy output.   -  MODERATE (SCALE 4-7): Increase of 4-6 stools daily over normal; moderate increase in ostomy output. * SEVERE (SCALE 8-10; OR 'WORST POSSIBLE'): Increase of 7 or more stools daily over normal; moderate increase in ostomy output; incontinence.     4 in last 24 hours 2. ONSET: "When did the diarrhea begin?"       Saturday "Mush now liquid like" 3. BM CONSISTENCY: "How loose or watery is the diarrhea?"      Mostly watery "With clumps' 4. VOMITING: "Are you also vomiting?" If Yes, ask: "How many times in the past 24 hours?"      No 5. ABDOMINAL PAIN: "Are you having any abdominal pain?" If Yes, ask: "What does it feel like?" (e.g., crampy, dull, intermittent, constant)      No 6. ABDOMINAL PAIN SEVERITY: If present, ask: "How bad is the pain?"  (e.g., Scale 1-10; mild, moderate, or severe)   - MILD (1-3): doesn't interfere with normal activities, abdomen soft and not tender to touch    - MODERATE (4-7): interferes with normal activities or awakens from sleep, abdomen tender to touch    - SEVERE (8-10): excruciating pain, doubled over, unable to do any normal activities       none 7. ORAL INTAKE: If vomiting, "Have you been able to drink liquids?" "How much liquids have you had in the past 24 hours?"     "Drinking Gatorade, water" 8. HYDRATION: "Any signs of dehydration?" (e.g., dry mouth [not just dry lips], too weak to stand, dizziness, new weight loss) "When did you last urinate?"     Increased fatigue 9. EXPOSURE: "Have you traveled to a foreign country recently?" "Have you been exposed to anyone with diarrhea?" "Could you have eaten any food that was spoiled?"     no 10. ANTIBIOTIC USE: "Are you taking antibiotics now or have you taken antibiotics in the past 2 months?"       Completed ATBs 04/08/2022 11. OTHER SYMPTOMS: "Do you have any  other symptoms?" (e.g., fever, blood in stool)       No  Protocols used: Diarrhea-A-AH

## 2022-04-26 ENCOUNTER — Encounter: Payer: Medicare (Managed Care) | Admitting: Speech Pathology

## 2022-04-26 ENCOUNTER — Ambulatory Visit (INDEPENDENT_AMBULATORY_CARE_PROVIDER_SITE_OTHER): Payer: Medicare (Managed Care) | Admitting: Internal Medicine

## 2022-04-26 ENCOUNTER — Encounter: Payer: Self-pay | Admitting: Internal Medicine

## 2022-04-26 VITALS — BP 125/83 | HR 80 | Temp 98.6°F

## 2022-04-26 DIAGNOSIS — F32A Depression, unspecified: Secondary | ICD-10-CM

## 2022-04-26 DIAGNOSIS — R110 Nausea: Secondary | ICD-10-CM

## 2022-04-26 DIAGNOSIS — C719 Malignant neoplasm of brain, unspecified: Secondary | ICD-10-CM

## 2022-04-26 DIAGNOSIS — R197 Diarrhea, unspecified: Secondary | ICD-10-CM

## 2022-04-26 LAB — VERITOR FLU A/B WAIVED
Influenza A: NEGATIVE
Influenza B: NEGATIVE

## 2022-04-26 MED ORDER — LACTINEX PO CHEW: 1.0000 | CHEWABLE_TABLET | ORAL | 1 refills | Status: DC | PRN

## 2022-04-26 NOTE — Progress Notes (Signed)
BP 125/83   Pulse 80   Temp 98.6 F (37 C) (Oral)   SpO2 98%    Subjective:    Patient ID: Cory Hayes, male    DOB: 1976/11/09, 46 y.o.   MRN: [id number]  Chief Complaint  Patient presents with   Diarrhea    Was given abx by Dr. Wynetta Emery on 5/19, ended on 5/26. Diarrhea started again on 04/22/22. Diarrhea was better from last in January on probiotic.    labwork    Would like to have lab work for sodium and potassium levels    HPI: Cory Hayes is a 46 y.o. male  Patient presents with: Diarrhea: Was given abx by Dr. Wynetta Emery on 5/19, ended on 5/26. Diarrhea started again on 04/22/22. Diarrhea was better from last in January on probiotic.  labwork: Would like to have lab work for sodium and potassium levels    Diarrhea  This is a recurrent (this weekend was liquid x this weekend 5 times x whole wekeend.) problem. The current episode started in the past 7 days (january was covid +ve . january chemotherapy ended.). Episode frequency: 5 rounds of radiation in march.done for now on chemo / radiation. The problem has been waxing and waning. The stool consistency is described as Watery. Pertinent negatives include no abdominal pain, arthralgias, bloating, chills, coughing, fever, headaches, increased  flatus, myalgias, sweats, URI, vomiting or weight loss.  Depression        This is a chronic (was seeing a therpist via zoom) problem.  The current episode started more than 1 month ago (> 6 months).   Associated symptoms include fatigue, insomnia and sad.  Associated symptoms include no decreased concentration, no helplessness, no hopelessness, not irritable, no restlessness, no decreased interest, no appetite change, no body aches, no myalgias, no headaches, no indigestion and no suicidal ideas.( Sad about his disability feels low. )   Chief Complaint  Patient presents with   Diarrhea    Was given abx by Dr. Wynetta Emery on 5/19, ended on 5/26. Diarrhea started again on 04/22/22. Diarrhea was  better from last in January on probiotic.    labwork    Would like to have lab work for sodium and potassium levels    Relevant past medical, surgical, family and social history reviewed and updated as indicated. Interim medical history since our last visit reviewed. Allergies and medications reviewed and updated.  Review of Systems  Constitutional:  Positive for fatigue. Negative for appetite change, chills, fever and weight loss.  Respiratory:  Negative for cough.   Gastrointestinal:  Positive for diarrhea. Negative for abdominal pain, bloating, flatus and vomiting.  Musculoskeletal:  Negative for arthralgias and myalgias.  Neurological:  Negative for headaches.  Psychiatric/Behavioral:  Positive for depression. Negative for decreased concentration and suicidal ideas. The patient has insomnia.     Per HPI unless specifically indicated above     Objective:    BP 125/83   Pulse 80   Temp 98.6 F (37 C) (Oral)   SpO2 98%   Wt Readings from Last 3 Encounters:  04/21/22 123 lb (55.8 kg)  04/07/22 123 lb (55.8 kg)  04/05/22 123 lb (55.8 kg)    Physical Exam Vitals and nursing note reviewed.  Constitutional:      General: He is not irritable.He is not in acute distress.    Appearance: Normal appearance. He is not ill-appearing or diaphoretic.  HENT:     Head: Normocephalic and atraumatic.     Right Ear:  Tympanic membrane and external ear normal. There is no impacted cerumen.     Left Ear: External ear normal.     Nose: No congestion or rhinorrhea.     Mouth/Throat:     Pharynx: No oropharyngeal exudate or posterior oropharyngeal erythema.  Eyes:     Conjunctiva/sclera: Conjunctivae normal.     Pupils: Pupils are equal, round, and reactive to light.  Cardiovascular:     Rate and Rhythm: Normal rate and regular rhythm.     Heart sounds: No murmur heard.    No friction rub. No gallop.  Pulmonary:     Effort: No respiratory distress.     Breath sounds: No stridor. No  wheezing or rhonchi.  Chest:     Chest wall: No tenderness.  Abdominal:     General: Abdomen is flat. Bowel sounds are normal.     Palpations: Abdomen is soft. There is no mass.     Tenderness: There is no abdominal tenderness.  Musculoskeletal:     Cervical back: Normal range of motion and neck supple. No rigidity or tenderness.     Left lower leg: No edema.  Skin:    General: Skin is warm and dry.  Neurological:     Mental Status: He is alert.     Results for orders placed or performed in visit on 02/24/22  CMP14+EGFR  Result Value Ref Range   Glucose 107 (H) 70 - 99 mg/dL   BUN 12 6 - 24 mg/dL   Creatinine, Ser 0.91 0.76 - 1.27 mg/dL   eGFR 107 >59 mL/min/1.73   BUN/Creatinine Ratio 13 9 - 20   Sodium 138 134 - 144 mmol/L   Potassium 4.5 3.5 - 5.2 mmol/L   Chloride 103 96 - 106 mmol/L   CO2 24 20 - 29 mmol/L   Calcium 8.7 8.7 - 10.2 mg/dL   Total Protein 6.0 6.0 - 8.5 g/dL   Albumin 3.5 (L) 4.0 - 5.0 g/dL   Globulin, Total 2.5 1.5 - 4.5 g/dL   Albumin/Globulin Ratio 1.4 1.2 - 2.2   Bilirubin Total 0.6 0.0 - 1.2 mg/dL   Alkaline Phosphatase 76 44 - 121 IU/L   AST 21 0 - 40 IU/L   ALT 14 0 - 44 IU/L  CBC with Differential/Platelet  Result Value Ref Range   WBC 2.5 (LL) 3.4 - 10.8 x10E3/uL   RBC 3.26 (L) 4.14 - 5.80 x10E6/uL   Hemoglobin 12.7 (L) 13.0 - 17.7 g/dL   Hematocrit 35.3 (L) 37.5 - 51.0 %   MCV 108 (H) 79 - 97 fL   MCH 39.0 (H) 26.6 - 33.0 pg   MCHC 36.0 (H) 31.5 - 35.7 g/dL   RDW 13.5 11.6 - 15.4 %   Platelets 141 (L) 150 - 450 x10E3/uL   Neutrophils 44 Not Estab. %   Lymphs 47 Not Estab. %   Monocytes 9 Not Estab. %   Eos 0 Not Estab. %   Basos 0 Not Estab. %   Neutrophils Absolute 1.1 (L) 1.4 - 7.0 x10E3/uL   Lymphocytes Absolute 1.2 0.7 - 3.1 x10E3/uL   Monocytes Absolute 0.2 0.1 - 0.9 x10E3/uL   EOS (ABSOLUTE) 0.0 0.0 - 0.4 x10E3/uL   Basophils Absolute 0.0 0.0 - 0.2 x10E3/uL   Immature Granulocytes 0 Not Estab. %   Immature Grans (Abs) 0.0  0.0 - 0.1 x10E3/uL        Current Outpatient Medications:    acetaminophen (TYLENOL) 500 MG tablet, Take 500 mg by mouth  every 6 (six) hours as needed., Disp: , Rfl:    ascorbic acid (VITAMIN C) 500 MG tablet, Take by mouth., Disp: , Rfl:    cloBAZam (ONFI) 10 MG tablet, Take 1 tablet by mouth 2 (two) times daily., Disp: , Rfl:    cyanocobalamin 1000 MCG tablet, Take by mouth., Disp: , Rfl:    lactobacillus acidophilus & bulgar (LACTINEX) chewable tablet, Chew 1 tablet by mouth 3 (three) times daily with meals., Disp: 30 tablet, Rfl: 1   levETIRAcetam (KEPPRA) 1000 MG tablet, Take 2,000 mg by mouth 2 (two) times daily., Disp: , Rfl:    lidocaine (LIDODERM) 5 %, Place 1 patch onto the skin daily. Remove & Discard patch within 12 hours or as directed by MD, Disp: 90 patch, Rfl: 3   lomustine (CEENU) 40 MG capsule, Every 6 weeks., Disp: , Rfl:    melatonin 3 MG TABS tablet, Take 1 tablet by mouth daily as needed., Disp: , Rfl:    metaxalone (SKELAXIN) 400 MG tablet, Take 0.5-1 tablets (200-400 mg total) by mouth at bedtime., Disp: 30 tablet, Rfl: 2   ondansetron (ZOFRAN) 4 MG tablet, Take 4 mg by mouth every 8 (eight) hours as needed., Disp: , Rfl:    pantoprazole (PROTONIX) 40 MG tablet, Take 1 tablet (40 mg total) by mouth daily., Disp: 30 tablet, Rfl: 6   Potassium Chloride ER 20 MEQ TBCR, TAKE 2 TABLETS (40 MEQ TOTAL) BY MOUTH 2 (TWO) TIMES DAILY WITH MEALS FOR 30 DAYS (Patient taking differently: Take 20 mEq by mouth daily.), Disp: 540 tablet, Rfl: 2   sodium chloride 1 g tablet, Take 1 g by mouth 2 (two) times daily with a meal., Disp: , Rfl:    sulfamethoxazole-trimethoprim (BACTRIM DS) 800-160 MG tablet, Take 1 tablet by mouth 2 (two) times daily., Disp: 14 tablet, Rfl: 0   divalproex (DEPAKOTE) 250 MG DR tablet, Take 5 tablets by mouth 2 (two) times daily., Disp: , Rfl:    lactobacillus acidophilus & bulgar (LACTINEX) chewable tablet, Chew 1 tablet by mouth as needed (take it after every  stools)., Disp: 50 tablet, Rfl: 1    Assessment & Plan:  Depression reluctant to start meds Will need therapy agreeable for such  To touch base with heme onc team at [facility] for resources thorugh the cancer team Will need to fu with me virtually in 2 weeks/  Diarrhea Check labs as below Start probiotics Check Flu and COVID today  Problem List Items Addressed This Visit       Other   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[place])   Relevant Medications   lactobacillus acidophilus & bulgar (LACTINEX) chewable tablet   Diarrhea   Relevant Medications   lactobacillus acidophilus & bulgar (LACTINEX) chewable tablet   Nausea   Relevant Medications   lactobacillus acidophilus & bulgar (LACTINEX) chewable tablet   Depression - Primary   Relevant Orders   AMB Referral to [place]     Orders Placed This Encounter  Procedures   AMB Referral to [facility] ordered this encounter  Medications   lactobacillus acidophilus & bulgar (LACTINEX) chewable tablet    Sig: Chew 1 tablet by mouth as needed (take it after every stools).    Dispense:  50 tablet    Refill:  1     Follow up plan: Return in about 4 weeks (around 05/24/2022).

## 2022-04-27 ENCOUNTER — Telehealth: Payer: Self-pay

## 2022-04-27 ENCOUNTER — Ambulatory Visit: Payer: Medicare (Managed Care) | Admitting: Physical Therapy

## 2022-04-27 ENCOUNTER — Ambulatory Visit: Payer: Medicare (Managed Care) | Admitting: Occupational Therapy

## 2022-04-27 ENCOUNTER — Ambulatory Visit: Payer: Medicare (Managed Care) | Admitting: Speech Pathology

## 2022-04-27 DIAGNOSIS — R197 Diarrhea, unspecified: Secondary | ICD-10-CM | POA: Diagnosis not present

## 2022-04-27 LAB — CBC WITH DIFFERENTIAL/PLATELET
Basophils Absolute: 0 10*3/uL (ref 0.0–0.2)
Basos: 0 %
EOS (ABSOLUTE): 0 10*3/uL (ref 0.0–0.4)
Eos: 1 %
Hematocrit: 36.2 % — ABNORMAL LOW (ref 37.5–51.0)
Hemoglobin: 12.6 g/dL — ABNORMAL LOW (ref 13.0–17.7)
Immature Grans (Abs): 0 10*3/uL (ref 0.0–0.1)
Immature Granulocytes: 1 %
Lymphocytes Absolute: 1.1 10*3/uL (ref 0.7–3.1)
Lymphs: 42 %
MCH: 37.5 pg — ABNORMAL HIGH (ref 26.6–33.0)
MCHC: 34.8 g/dL (ref 31.5–35.7)
MCV: 108 fL — ABNORMAL HIGH (ref 79–97)
Monocytes Absolute: 0.3 10*3/uL (ref 0.1–0.9)
Monocytes: 10 %
Neutrophils Absolute: 1.2 10*3/uL — ABNORMAL LOW (ref 1.4–7.0)
Neutrophils: 46 %
Platelets: 140 10*3/uL — ABNORMAL LOW (ref 150–450)
RBC: 3.36 x10E6/uL — ABNORMAL LOW (ref 4.14–5.80)
RDW: 13.3 % (ref 11.6–15.4)
WBC: 2.6 10*3/uL — ABNORMAL LOW (ref 3.4–10.8)

## 2022-04-27 LAB — COMPREHENSIVE METABOLIC PANEL
ALT: 11 IU/L (ref 0–44)
AST: 15 IU/L (ref 0–40)
Albumin/Globulin Ratio: 1.3 (ref 1.2–2.2)
Albumin: 3.3 g/dL — ABNORMAL LOW (ref 4.0–5.0)
Alkaline Phosphatase: 66 IU/L (ref 44–121)
BUN/Creatinine Ratio: 17 (ref 9–20)
BUN: 13 mg/dL (ref 6–24)
Bilirubin Total: 0.4 mg/dL (ref 0.0–1.2)
CO2: 22 mmol/L (ref 20–29)
Calcium: 8.9 mg/dL (ref 8.7–10.2)
Chloride: 104 mmol/L (ref 96–106)
Creatinine, Ser: 0.75 mg/dL — ABNORMAL LOW (ref 0.76–1.27)
Globulin, Total: 2.6 g/dL (ref 1.5–4.5)
Glucose: 109 mg/dL — ABNORMAL HIGH (ref 70–99)
Potassium: 4.2 mmol/L (ref 3.5–5.2)
Sodium: 139 mmol/L (ref 134–144)
Total Protein: 5.9 g/dL — ABNORMAL LOW (ref 6.0–8.5)
eGFR: 114 mL/min/{1.73_m2} (ref >59)

## 2022-04-27 NOTE — Chronic Care Management (AMB) (Signed)
  Care Management   Outreach Note  04/27/2022 Name: Cory Hayes MRN: [id number] DOB: [birth year]  Referred by: Charlynne Cousins, MD Reason for referral : Care Coordination (Outreach to schedule referral with LCSW )   An unsuccessful telephone outreach was attempted today. The patient was referred to the case management team for assistance with care management and care coordination.   Follow Up Plan:  A HIPAA compliant phone message was left for the patient providing contact information and requesting a return call.  The care management team will reach out to the patient again over the next 2 days.  If patient returns call to provider office, please advise to call [place] * at [phone]*  Noreene Larsson, [place], [place] [facility]  [place], [place] [address] Direct Dial: [phone] [email or web address]

## 2022-04-28 ENCOUNTER — Telehealth: Payer: Self-pay

## 2022-04-28 LAB — NOVEL CORONAVIRUS, NAA: SARS-CoV-2, NAA: NOT DETECTED

## 2022-04-28 NOTE — Telephone Encounter (Signed)
Called and informed patient's mother that lab tests were negativef

## 2022-04-28 NOTE — Telephone Encounter (Signed)
Pt's mother Jacqlyn Larsen), called and wanted to know the results of the pt's COVID test. The result is in, but needs provider review so the results can be released to pt. Please advise pt.

## 2022-05-02 ENCOUNTER — Telehealth: Payer: Self-pay | Admitting: Internal Medicine

## 2022-05-02 NOTE — Telephone Encounter (Signed)
Copied from [place] [id number]. Topic: General - Other >> May 02, 2022  9:16 AM Marcellus Scott wrote: Reason for CRM: Pt mother is requesting a call back to discuss recent labs.

## 2022-05-02 NOTE — Telephone Encounter (Signed)
Pts mother is calling back anxiously awaiting the lab results. Concerned levels are evaluated.

## 2022-05-02 NOTE — Telephone Encounter (Signed)
Routing to provider for results.

## 2022-05-02 NOTE — Chronic Care Management (AMB) (Signed)
  Care Management   Note  05/02/2022 Name: Cory Hayes MRN: [id number] DOB: 08-Jun-1976  [name] is a 46 y.o. year old male who is a primary care patient of Vigg, Avanti, MD. I reached out to [employer] by phone today offer care coordination services.   [name] was given information about care management services today including:  Care management services include personalized support from designated clinical staff supervised by his physician, including individualized plan of care and coordination with other care providers 24/7 contact phone numbers for assistance for urgent and routine care needs. The patient may stop care management services at any time by phone call to the office staff.  Patient agreed to services and verbal consent obtained.   Follow up plan: Telephone appointment with care management team member scheduled for:05/09/2022  SIGNATURE

## 2022-05-03 ENCOUNTER — Ambulatory Visit: Payer: Self-pay | Admitting: *Deleted

## 2022-05-03 ENCOUNTER — Other Ambulatory Visit: Payer: Self-pay | Admitting: Internal Medicine

## 2022-05-03 ENCOUNTER — Ambulatory Visit: Payer: Medicare (Managed Care) | Admitting: Speech Pathology

## 2022-05-03 ENCOUNTER — Ambulatory Visit: Payer: Medicare (Managed Care) | Admitting: Occupational Therapy

## 2022-05-03 LAB — CDIFF NAA+O+P+STOOL CULTURE
E coli, Shiga toxin Assay: NEGATIVE
Toxigenic C. Difficile by PCR: NEGATIVE

## 2022-05-03 MED ORDER — PANTOPRAZOLE SODIUM 40 MG PO TBEC: 40.0000 mg | DELAYED_RELEASE_TABLET | Freq: Every day | ORAL | 1 refills | Status: AC

## 2022-05-03 NOTE — Telephone Encounter (Signed)
Medication Refill - Medication:pantoprazole (PROTONIX) 40 MG tablet   Has the patient contacted their pharmacy? yes (Agent: If no, request that the patient contact the pharmacy for the refill. If patient does not wish to contact the pharmacy document the reason why and proceed with request.) (Agent: If yes, when and what did the pharmacy advise?)contact pcp  Preferred Pharmacy (with phone number or street name[facility]  [id number] [place]  [phone] Fax:  [phone]    Has the patient been seen for an appointment in the last year OR does the patient have an upcoming appointment? yes  Agent: Please be advised that RX refills may take up to 3 business days. We ask that you follow-up with your pharmacy.

## 2022-05-03 NOTE — Telephone Encounter (Signed)
Please advise on patient's lab results ASAP.

## 2022-05-03 NOTE — Telephone Encounter (Signed)
Patient's mother notified of results.

## 2022-05-03 NOTE — Telephone Encounter (Signed)
This is not our patient. Thanks.

## 2022-05-03 NOTE — Telephone Encounter (Signed)
Pts mother checking status of return call for lab results  Please advise

## 2022-05-03 NOTE — Telephone Encounter (Signed)
  Chief Complaint: Mother called in for son's lab results.  No interpretation has been released.   Requesting to be called about results. Symptoms: Son had a questionable seizure on Sat. And yesterday morning.   EMS came and he was ok.   She is concerned about his sodium and potassium Frequency: N/A Pertinent Negatives: Patient denies N/A Disposition: '[]'$ ED /'[]'$ Urgent Care (no appt availability in office) / '[]'$ Appointment(In office/virtual)/ '[]'$  [facility]/ '[]'$ Home Care/ '[]'$ Refused Recommended Disposition /'[]'$ [place] [facility]/ '[x]'$  Follow-up with PCP Additional Notes: Message sent to Dr. Neomia Dear for interpretation of lab results.

## 2022-05-03 NOTE — Telephone Encounter (Signed)
Reason for Disposition  Caller requesting lab results  (Exception: Routine or non-urgent lab result.)  Answer Assessment - Initial Assessment Questions 1. REASON FOR CALL or QUESTION: "What is your reason for calling today?" or "How can I best help you?" or "What question do you have that I can help answer?"     Mother [facility] called in requesting the results of son's lab results and stool sample.  The results are back but no interpretation from provider has been released to the nurses.  Mother would like a call back at [phone].  Protocols used: Information Only Call - No Triage-A-AH, PCP Call - No Triage-A-AH

## 2022-05-03 NOTE — Telephone Encounter (Signed)
Requested medication (s) are due for refill today: yes  Requested medication (s) are on the active medication list: yes  Last refill:  03/15/21 #30 6 RF  Future visit scheduled: no  Notes to clinic:  do not see an OV where med was mentioned - please review   Requested Prescriptions  Pending Prescriptions Disp Refills   pantoprazole (PROTONIX) 40 MG tablet 30 tablet 6    Sig: Take 1 tablet (40 mg total) by mouth daily.     Gastroenterology: Proton Pump Inhibitors Passed - 05/03/2022  9:02 AM      Passed - Valid encounter within last 12 months    Recent Outpatient Visits           1 week ago Depression, unspecified depression type   [facility] Vigg, Avanti, MD   1 month ago Pilonidal cyst   [place], Megan P, DO   4 months ago Pilonidal cyst   [place] Vigg, Avanti, MD   4 months ago Pilonidal cyst   [place], Henrine Screws T, NP   5 months ago Depression, recurrent [facility])   [place], MD       Future Appointments             In 3 weeks Kathrine Haddock, NP [place], [place]   In 2 months  [employer], [place]

## 2022-05-03 NOTE — Telephone Encounter (Signed)
Medication Refill - Medication:LACTINEX CHEWABLE TABLET Patient's mother [facility] but [facility] has the alternative Lactoballus that they have to order Has the patient contacted their pharmacy? yes (Agent: If no, request that the patient contact the pharmacy for the refill. If patient does not wish to contact the pharmacy document the reason why and proceed with request.) (Agent: If yes, when and what did the pharmacy advise?)contact pcp  Preferred Pharmacy (with phone number or street name): [facility]  [place], [place], [place] [address] [phone] Has the patient been seen for an appointment in the last year OR does the patient have an upcoming appointment? no  Agent: Please be advised that RX refills may take up to 3 business days. We ask that you follow-up with your pharmacy.

## 2022-05-04 ENCOUNTER — Ambulatory Visit: Payer: Self-pay | Admitting: *Deleted

## 2022-05-04 NOTE — Telephone Encounter (Signed)
Summary: Call bk to mother re pt diarrhea   Pt mother states must talk to the nurse as her son is still having terrible diarrhea. She is expecting a call from his oncologist and pls leave a message if the line is busy as states this is very important to speak to the nurse. FU at [phone]      Reason for Disposition  [1] MODERATE diarrhea (e.g., 4-6 times / day more than normal) AND [2] present > 48 hours (2 days)  Answer Assessment - Initial Assessment Questions 1. DIARRHEA SEVERITY: "How bad is the diarrhea?" "How many more stools have you had in the past 24 hours than normal?"    - NO DIARRHEA (SCALE 0)   - MILD (SCALE 1-3): Few loose or mushy BMs; increase of 1-3 stools over normal daily number of stools; mild increase in ostomy output.   -  MODERATE (SCALE 4-7): Increase of 4-6 stools daily over normal; moderate increase in ostomy output. * SEVERE (SCALE 8-10; OR 'WORST POSSIBLE'): Increase of 7 or more stools daily over normal; moderate increase in ostomy output; incontinence.     4 stools today 2. ONSET: "When did the diarrhea begin?"      Ongoing- medication seemed to help a lot-lactobacillus acidophilus & bulgar (LACTINEX) chewable tablet      Sig: Chew 1 tablet by mouth as needed (take it after every stools). But now having random loose stools- whole pill and green stool passed today. Concerned about rapid digestion and liver function. Patient is having trouble eating-appetite comes and goes using BRATS diet and replacing electrolytes       3. BM CONSISTENCY: "How loose or watery is the diarrhea?"      First BM today- mushy- now loose to liquid 4. VOMITING: "Are you also vomiting?" If Yes, ask: "How many times in the past 24 hours?"      no 5. ABDOMINAL PAIN: "Are you having any abdominal pain?" If Yes, ask: "What does it feel like?" (e.g., crampy, dull, intermittent, constant)      no 6. ABDOMINAL PAIN SEVERITY: If present, ask: "How bad is the pain?"  (e.g., Scale 1-10; mild,  moderate, or severe)   - MILD (1-3): doesn't interfere with normal activities, abdomen soft and not tender to touch    - MODERATE (4-7): interferes with normal activities or awakens from sleep, abdomen tender to touch    - SEVERE (8-10): excruciating pain, doubled over, unable to do any normal activities       no 7. ORAL INTAKE: If vomiting, "Have you been able to drink liquids?" "How much liquids have you had in the past 24 hours?"     Patient is drinking Gatorade, water, tea- not able to tell how much 8. HYDRATION: "Any signs of dehydration?" (e.g., dry mouth [not just dry lips], too weak to stand, dizziness, new weight loss) "When did you last urinate?"     Ongoing fatigue 9. EXPOSURE: "Have you traveled to a foreign country recently?" "Have you been exposed to anyone with diarrhea?" "Could you have eaten any food that was spoiled?"     Recent cancer treatment 10. ANTIBIOTIC USE: "Are you taking antibiotics now or have you taken antibiotics in the past 2 months?"        Just finished antibiotic in May 11. OTHER SYMPTOMS: "Do you have any other symptoms?" (e.g., fever, blood in stool)       no 12. PREGNANCY: "Is there any chance you are pregnant?" "When was  your last menstrual period?"  Protocols used: [facility]

## 2022-05-04 NOTE — Telephone Encounter (Signed)
FYI to provider and CMA.

## 2022-05-05 ENCOUNTER — Telehealth: Payer: Self-pay | Admitting: Internal Medicine

## 2022-05-05 ENCOUNTER — Ambulatory Visit: Payer: Medicare (Managed Care) | Admitting: Physical Therapy

## 2022-05-05 ENCOUNTER — Ambulatory Visit: Payer: Medicare (Managed Care) | Admitting: Occupational Therapy

## 2022-05-05 ENCOUNTER — Encounter: Payer: Self-pay | Admitting: Internal Medicine

## 2022-05-05 ENCOUNTER — Telehealth (INDEPENDENT_AMBULATORY_CARE_PROVIDER_SITE_OTHER): Payer: Medicare (Managed Care) | Admitting: Internal Medicine

## 2022-05-05 ENCOUNTER — Encounter: Payer: Medicare (Managed Care) | Admitting: Internal Medicine

## 2022-05-05 DIAGNOSIS — C719 Malignant neoplasm of brain, unspecified: Secondary | ICD-10-CM | POA: Insufficient documentation

## 2022-05-05 DIAGNOSIS — R197 Diarrhea, unspecified: Secondary | ICD-10-CM | POA: Diagnosis not present

## 2022-05-05 NOTE — Telephone Encounter (Signed)
Copied from [place] [id number]. Topic: General - Other >> May 05, 2022  9:10 AM Leilani Able wrote: Reason for CRM: Please call for this appt, pt mother can not do MyChart [phone]

## 2022-05-05 NOTE — Progress Notes (Signed)
There were no vitals taken for this visit.   Subjective:    Patient ID: Cory Hayes, male    DOB: 1976/01/29, 46 y.o.   MRN: [id number]  Chief Complaint  Patient presents with   Diarrhea    Had restless night up about 3 times, has not had a solid bm in awhile.  Needs referral to GI    HPI: Cory Hayes is a 46 y.o. male   This visit was completed via video visit through MyChart due to the restrictions of the COVID-19 pandemic. All issues as above were discussed and addressed. Physical exam was done as above through visual confirmation on video through MyChart. If it was felt that the patient should be evaluated in the office, they were directed there. The patient verbally consented to this visit. Location of the patient: home Location of the provider: work Those involved with this call:  Provider: Charlynne Cousins, MD CMA: Frazier Butt, [place] Desk/Registration: [employer]  Time spent on call: 10 minutes with patient face to face via video conference. More than 50% of this time was spent in counseling and coordination of care. 10 minutes total spent in review of patient's record and preparation of their chart.    Diarrhea  This is a recurrent (appetite wnl. eats dinner and lunch ok, breakfast) problem. The current episode started in the past 7 days. Pertinent negatives include no abdominal pain, arthralgias, bloating, chills, coughing, fever, headaches, increased  flatus, myalgias, sweats, URI, vomiting or weight loss.    Chief Complaint  Patient presents with   Diarrhea    Had restless night up about 3 times, has not had a solid bm in awhile.  Needs referral to GI    Relevant past medical, surgical, family and social history reviewed and updated as indicated. Interim medical history since our last visit reviewed. Allergies and medications reviewed and updated.  Review of Systems  Constitutional:  Negative for chills, fever and weight loss.  Respiratory:  Negative for  cough.   Gastrointestinal:  Positive for diarrhea. Negative for abdominal pain, bloating, flatus and vomiting.  Musculoskeletal:  Negative for arthralgias and myalgias.  Neurological:  Negative for headaches.    Per HPI unless specifically indicated above     Objective:    There were no vitals taken for this visit.  Wt Readings from Last 3 Encounters:  04/21/22 123 lb (55.8 kg)  04/07/22 123 lb (55.8 kg)  04/05/22 123 lb (55.8 kg)    Physical Exam  Results for orders placed or performed in visit on 04/26/22  Cdiff NAA+O+P+Stool Culture   Specimen: Stool   ST  Result Value Ref Range   Salmonella/Shigella Screen Final report    Stool Culture result 1 (RSASHR) Comment    Campylobacter Culture Final report    Stool Culture result 1 (CMPCXR) Comment    E coli, Shiga toxin Assay Negative Negative   OVA + PARASITE EXAM Final report    O&P result 1 Comment    Toxigenic C. Difficile by PCR Negative Negative  Novel Coronavirus, NAA ([facility])   Specimen: Nasopharyngeal(NP) swabs in vial transport medium  Result Value Ref Range   SARS-CoV-2, NAA Not Detected Not Detected  CBC with Differential/Platelet  Result Value Ref Range   WBC 2.6 (L) 3.4 - 10.8 x10E3/uL   RBC 3.36 (L) 4.14 - 5.80 x10E6/uL   Hemoglobin 12.6 (L) 13.0 - 17.7 g/dL   Hematocrit 36.2 (L) 37.5 - 51.0 %   MCV 108 (H) 79 -  97 fL   MCH 37.5 (H) 26.6 - 33.0 pg   MCHC 34.8 31.5 - 35.7 g/dL   RDW 13.3 11.6 - 15.4 %   Platelets 140 (L) 150 - 450 x10E3/uL   Neutrophils 46 Not Estab. %   Lymphs 42 Not Estab. %   Monocytes 10 Not Estab. %   Eos 1 Not Estab. %   Basos 0 Not Estab. %   Neutrophils Absolute 1.2 (L) 1.4 - 7.0 x10E3/uL   Lymphocytes Absolute 1.1 0.7 - 3.1 x10E3/uL   Monocytes Absolute 0.3 0.1 - 0.9 x10E3/uL   EOS (ABSOLUTE) 0.0 0.0 - 0.4 x10E3/uL   Basophils Absolute 0.0 0.0 - 0.2 x10E3/uL   Immature Granulocytes 1 Not Estab. %   Immature Grans (Abs) 0.0 0.0 - 0.1 x10E3/uL  Comprehensive metabolic  panel  Result Value Ref Range   Glucose 109 (H) 70 - 99 mg/dL   BUN 13 6 - 24 mg/dL   Creatinine, Ser 0.75 (L) 0.76 - 1.27 mg/dL   eGFR 114 >59 mL/min/1.73   BUN/Creatinine Ratio 17 9 - 20   Sodium 139 134 - 144 mmol/L   Potassium 4.2 3.5 - 5.2 mmol/L   Chloride 104 96 - 106 mmol/L   CO2 22 20 - 29 mmol/L   Calcium 8.9 8.7 - 10.2 mg/dL   Total Protein 5.9 (L) 6.0 - 8.5 g/dL   Albumin 3.3 (L) 4.0 - 5.0 g/dL   Globulin, Total 2.6 1.5 - 4.5 g/dL   Albumin/Globulin Ratio 1.3 1.2 - 2.2   Bilirubin Total 0.4 0.0 - 1.2 mg/dL   Alkaline Phosphatase 66 44 - 121 IU/L   AST 15 0 - 40 IU/L   ALT 11 0 - 44 IU/L  Veritor Flu A/B Waived  Result Value Ref Range   Influenza A Negative Negative   Influenza B Negative Negative        Current Outpatient Medications:    acetaminophen (TYLENOL) 500 MG tablet, Take 500 mg by mouth every 6 (six) hours as needed., Disp: , Rfl:    ascorbic acid (VITAMIN C) 500 MG tablet, Take by mouth., Disp: , Rfl:    cloBAZam (ONFI) 10 MG tablet, Take 1 tablet by mouth 2 (two) times daily., Disp: , Rfl:    cyanocobalamin 1000 MCG tablet, Take by mouth., Disp: , Rfl:    lactobacillus acidophilus & bulgar (LACTINEX) chewable tablet, Chew 1 tablet by mouth 3 (three) times daily with meals., Disp: 30 tablet, Rfl: 1   lactobacillus acidophilus & bulgar (LACTINEX) chewable tablet, Chew 1 tablet by mouth as needed (take it after every stools)., Disp: 50 tablet, Rfl: 1   levETIRAcetam (KEPPRA) 1000 MG tablet, Take 2,000 mg by mouth 2 (two) times daily., Disp: , Rfl:    lidocaine (LIDODERM) 5 %, Place 1 patch onto the skin daily. Remove & Discard patch within 12 hours or as directed by MD, Disp: 90 patch, Rfl: 3   lomustine (CEENU) 40 MG capsule, Every 6 weeks., Disp: , Rfl:    melatonin 3 MG TABS tablet, Take 1 tablet by mouth daily as needed., Disp: , Rfl:    metaxalone (SKELAXIN) 400 MG tablet, Take 0.5-1 tablets (200-400 mg total) by mouth at bedtime., Disp: 30 tablet,  Rfl: 2   ondansetron (ZOFRAN) 4 MG tablet, Take 4 mg by mouth every 8 (eight) hours as needed., Disp: , Rfl:    pantoprazole (PROTONIX) 40 MG tablet, Take 1 tablet (40 mg total) by mouth daily., Disp: 90 tablet, Rfl: 1  Potassium Chloride ER 20 MEQ TBCR, TAKE 2 TABLETS (40 MEQ TOTAL) BY MOUTH 2 (TWO) TIMES DAILY WITH MEALS FOR 30 DAYS (Patient taking differently: Take 20 mEq by mouth daily.), Disp: 540 tablet, Rfl: 2   sodium chloride 1 g tablet, Take 1 g by mouth 2 (two) times daily with a meal., Disp: , Rfl:    divalproex (DEPAKOTE) 250 MG DR tablet, Take 5 tablets by mouth 2 (two) times daily., Disp: , Rfl:     Assessment & Plan:  Diarrhea recurrent -ve for stool culture / c diff O and P ,  COVID and FLU . Check CT abdomen and pelvis. Will refer to GI for such  Will need to use probiotics after every loose stool  imodium prn.  Pt advised a bland BRAT diet today.  Advised to call the office or go to the ER if she develops further abdominal pain crampign, any new onset of bleeding / black stools or  fresh red blood from any orifice,.  Pt verbalized understanding of such.  Problem List Items Addressed This Visit       Other   Diarrhea - Primary   Relevant Orders   Ambulatory referral to Gastroenterology   CT Abdomen Pelvis Wo Contrast     Orders Placed This Encounter  Procedures   CT Abdomen Pelvis Wo Contrast   Ambulatory referral to Gastroenterology     No orders of the defined types were placed in this encounter.    Follow up plan: No follow-ups on file.

## 2022-05-09 ENCOUNTER — Telehealth: Payer: Self-pay | Admitting: *Deleted

## 2022-05-09 ENCOUNTER — Ambulatory Visit (INDEPENDENT_AMBULATORY_CARE_PROVIDER_SITE_OTHER): Payer: Medicare (Managed Care) | Admitting: Licensed Clinical Social Worker

## 2022-05-09 DIAGNOSIS — F339 Major depressive disorder, recurrent, unspecified: Secondary | ICD-10-CM

## 2022-05-09 DIAGNOSIS — F419 Anxiety disorder, unspecified: Secondary | ICD-10-CM

## 2022-05-09 DIAGNOSIS — G894 Chronic pain syndrome: Secondary | ICD-10-CM

## 2022-05-10 ENCOUNTER — Ambulatory Visit: Payer: Medicare (Managed Care) | Admitting: Occupational Therapy

## 2022-05-10 ENCOUNTER — Encounter: Payer: Self-pay | Admitting: Occupational Therapy

## 2022-05-10 DIAGNOSIS — R471 Dysarthria and anarthria: Secondary | ICD-10-CM | POA: Diagnosis not present

## 2022-05-10 DIAGNOSIS — M6281 Muscle weakness (generalized): Secondary | ICD-10-CM

## 2022-05-11 ENCOUNTER — Encounter: Payer: Medicare (Managed Care) | Admitting: Speech Pathology

## 2022-05-11 ENCOUNTER — Telehealth (INDEPENDENT_AMBULATORY_CARE_PROVIDER_SITE_OTHER): Payer: Medicare (Managed Care) | Admitting: Internal Medicine

## 2022-05-11 ENCOUNTER — Encounter: Payer: Self-pay | Admitting: Internal Medicine

## 2022-05-11 DIAGNOSIS — R197 Diarrhea, unspecified: Secondary | ICD-10-CM

## 2022-05-11 DIAGNOSIS — L0591 Pilonidal cyst without abscess: Secondary | ICD-10-CM

## 2022-05-11 NOTE — Progress Notes (Signed)
There were no vitals taken for this visit.   Subjective:    Patient ID: Cory Hayes, male    DOB: 1976-01-03, 46 y.o.   MRN: [id number]  Chief Complaint  Patient presents with  . Diarrhea    Getting better.    HPI: Cory Hayes is a 46 y.o. male   This visit was completed via video visit through MyChart due to the restrictions of the COVID-19 pandemic. All issues as above were discussed and addressed. Physical exam was done as above through visual confirmation on video through MyChart. If it was felt that the patient should be evaluated in the office, they were directed there. The patient verbally consented to this visit. Location of the patient: home Location of the provider: work Those involved with this call:  Provider: Charlynne Cousins, MD CMA: Frazier Butt, [place] Desk/Registration: [employer]  Time spent on call: 15 minutes with patient face to face via video conference. More than 50% of this time was spent in counseling and coordination of care. 15 minutes total spent in review of patient's record and preparation of their chart.    Diarrhea  Chronicity: pt has been better now, more standard stools per his verbal record. The problem has been gradually improving. Pertinent negatives include no abdominal pain, arthralgias, bloating, chills, coughing, fever, headaches, increased  flatus, myalgias, sweats, URI, vomiting or weight loss.    Chief Complaint  Patient presents with  . Diarrhea    Getting better.    Relevant past medical, surgical, family and social history reviewed and updated as indicated. Interim medical history since our last visit reviewed. Allergies and medications reviewed and updated.  Review of Systems  Constitutional:  Negative for chills, fever and weight loss.  Respiratory:  Negative for cough.   Gastrointestinal:  Positive for diarrhea. Negative for abdominal pain, bloating, flatus and vomiting.  Musculoskeletal:  Negative for arthralgias  and myalgias.  Neurological:  Negative for headaches.    Per HPI unless specifically indicated above     Objective:    There were no vitals taken for this visit.  Wt Readings from Last 3 Encounters:  04/21/22 123 lb (55.8 kg)  04/07/22 123 lb (55.8 kg)  04/05/22 123 lb (55.8 kg)    Physical Exam Unable to peform sec to virtual visit.   Results for orders placed or performed in visit on 04/26/22  Cdiff NAA+O+P+Stool Culture   Specimen: Stool   ST  Result Value Ref Range   Salmonella/Shigella Screen Final report    Stool Culture result 1 (RSASHR) Comment    Campylobacter Culture Final report    Stool Culture result 1 (CMPCXR) Comment    E coli, Shiga toxin Assay Negative Negative   OVA + PARASITE EXAM Final report    O&P result 1 Comment    Toxigenic C. Difficile by PCR Negative Negative  Novel Coronavirus, NAA ([facility])   Specimen: Nasopharyngeal(NP) swabs in vial transport medium  Result Value Ref Range   SARS-CoV-2, NAA Not Detected Not Detected  CBC with Differential/Platelet  Result Value Ref Range   WBC 2.6 (L) 3.4 - 10.8 x10E3/uL   RBC 3.36 (L) 4.14 - 5.80 x10E6/uL   Hemoglobin 12.6 (L) 13.0 - 17.7 g/dL   Hematocrit 36.2 (L) 37.5 - 51.0 %   MCV 108 (H) 79 - 97 fL   MCH 37.5 (H) 26.6 - 33.0 pg   MCHC 34.8 31.5 - 35.7 g/dL   RDW 13.3 11.6 - 15.4 %   Platelets  140 (L) 150 - 450 x10E3/uL   Neutrophils 46 Not Estab. %   Lymphs 42 Not Estab. %   Monocytes 10 Not Estab. %   Eos 1 Not Estab. %   Basos 0 Not Estab. %   Neutrophils Absolute 1.2 (L) 1.4 - 7.0 x10E3/uL   Lymphocytes Absolute 1.1 0.7 - 3.1 x10E3/uL   Monocytes Absolute 0.3 0.1 - 0.9 x10E3/uL   EOS (ABSOLUTE) 0.0 0.0 - 0.4 x10E3/uL   Basophils Absolute 0.0 0.0 - 0.2 x10E3/uL   Immature Granulocytes 1 Not Estab. %   Immature Grans (Abs) 0.0 0.0 - 0.1 x10E3/uL  Comprehensive metabolic panel  Result Value Ref Range   Glucose 109 (H) 70 - 99 mg/dL   BUN 13 6 - 24 mg/dL   Creatinine, Ser 0.75 (L) 0.76  - 1.27 mg/dL   eGFR 114 >59 mL/min/1.73   BUN/Creatinine Ratio 17 9 - 20   Sodium 139 134 - 144 mmol/L   Potassium 4.2 3.5 - 5.2 mmol/L   Chloride 104 96 - 106 mmol/L   CO2 22 20 - 29 mmol/L   Calcium 8.9 8.7 - 10.2 mg/dL   Total Protein 5.9 (L) 6.0 - 8.5 g/dL   Albumin 3.3 (L) 4.0 - 5.0 g/dL   Globulin, Total 2.6 1.5 - 4.5 g/dL   Albumin/Globulin Ratio 1.3 1.2 - 2.2   Bilirubin Total 0.4 0.0 - 1.2 mg/dL   Alkaline Phosphatase 66 44 - 121 IU/L   AST 15 0 - 40 IU/L   ALT 11 0 - 44 IU/L  Veritor Flu A/B Waived  Result Value Ref Range   Influenza A Negative Negative   Influenza B Negative Negative        Current Outpatient Medications:  .  acetaminophen (TYLENOL) 500 MG tablet, Take 500 mg by mouth every 6 (six) hours as needed., Disp: , Rfl:  .  ascorbic acid (VITAMIN C) 500 MG tablet, Take by mouth., Disp: , Rfl:  .  cloBAZam (ONFI) 10 MG tablet, Take 1 tablet by mouth 2 (two) times daily., Disp: , Rfl:  .  cyanocobalamin 1000 MCG tablet, Take by mouth., Disp: , Rfl:  .  lactobacillus acidophilus & bulgar (LACTINEX) chewable tablet, Chew 1 tablet by mouth 3 (three) times daily with meals., Disp: 30 tablet, Rfl: 1 .  lactobacillus acidophilus & bulgar (LACTINEX) chewable tablet, Chew 1 tablet by mouth as needed (take it after every stools)., Disp: 50 tablet, Rfl: 1 .  levETIRAcetam (KEPPRA) 1000 MG tablet, Take 2,000 mg by mouth 2 (two) times daily., Disp: , Rfl:  .  lidocaine (LIDODERM) 5 %, Place 1 patch onto the skin daily. Remove & Discard patch within 12 hours or as directed by MD, Disp: 90 patch, Rfl: 3 .  lomustine (CEENU) 40 MG capsule, Every 6 weeks., Disp: , Rfl:  .  melatonin 3 MG TABS tablet, Take 1 tablet by mouth daily as needed., Disp: , Rfl:  .  metaxalone (SKELAXIN) 400 MG tablet, Take 0.5-1 tablets (200-400 mg total) by mouth at bedtime., Disp: 30 tablet, Rfl: 2 .  ondansetron (ZOFRAN) 4 MG tablet, Take 4 mg by mouth every 8 (eight) hours as needed., Disp: , Rfl:   .  pantoprazole (PROTONIX) 40 MG tablet, Take 1 tablet (40 mg total) by mouth daily., Disp: 90 tablet, Rfl: 1 .  Potassium Chloride ER 20 MEQ TBCR, TAKE 2 TABLETS (40 MEQ TOTAL) BY MOUTH 2 (TWO) TIMES DAILY WITH MEALS FOR 30 DAYS (Patient taking differently: Take 20  mEq by mouth daily.), Disp: 540 tablet, Rfl: 2 .  sodium chloride 1 g tablet, Take 1 g by mouth 2 (two) times daily with a meal., Disp: , Rfl:  .  divalproex (DEPAKOTE) 250 MG DR tablet, Take 5 tablets by mouth 2 (two) times daily., Disp: , Rfl:     Assessment & Plan:  Diarrhea : stable  Will need to fu with GI for a follow up sooner rather than later.   2. Cyst / pilonidal cysts   To see gen surg for his abscess in his buttock   Problem List Items Addressed This Visit       Other   Diarrhea - Primary     No orders of the defined types were placed in this encounter.    No orders of the defined types were placed in this encounter.    Follow up plan: No follow-ups on file.

## 2022-05-12 ENCOUNTER — Encounter: Payer: Self-pay | Admitting: Surgery

## 2022-05-12 ENCOUNTER — Ambulatory Visit: Payer: Medicare (Managed Care) | Admitting: Physical Therapy

## 2022-05-12 ENCOUNTER — Ambulatory Visit (INDEPENDENT_AMBULATORY_CARE_PROVIDER_SITE_OTHER): Payer: Medicare (Managed Care) | Admitting: Surgery

## 2022-05-12 ENCOUNTER — Ambulatory Visit: Payer: Medicare (Managed Care) | Admitting: Occupational Therapy

## 2022-05-12 ENCOUNTER — Telehealth: Payer: Self-pay

## 2022-05-12 ENCOUNTER — Ambulatory Visit: Payer: Medicare (Managed Care) | Admitting: Speech Pathology

## 2022-05-12 VITALS — BP 116/82 | HR 73 | Temp 98.2°F | Wt 117.0 lb

## 2022-05-12 DIAGNOSIS — L02219 Cutaneous abscess of trunk, unspecified: Secondary | ICD-10-CM

## 2022-05-12 NOTE — Progress Notes (Signed)
This encounter was created in error - please disregard.

## 2022-05-12 NOTE — Patient Instructions (Signed)
If you have any concerns or questions, please feel free to call our office.   Pilonidal Cyst Drainage, Care After This sheet gives you information about how to care for yourself after your procedure. Your health care provider may also give you more specific instructions. If you have problems or questions, contact your health care provider. What can I expect after the procedure? After the procedure, it is common to have: Pain that gets better when you take medicine. Some fluid or blood coming from your wound. Follow these instructions at home: Medicines Take over-the-counter and prescription medicines only as told by your health care provider. If you were prescribed an antibiotic medicine, take it as told by your health care provider. Do not stop taking the antibiotic even if you start to feel better. Lifestyle Do not do activities that irritate or put pressure on your buttocks for about 2 weeks, or as long as told by your health care provider. These activities include bike riding, running, and anything that involves a twisting motion. Do not sit for long periods at a time without getting up to move around. Sleep on your side instead of your back. Avoid wearing tight underwear and tight pants. Bathing Do not take baths or showers, swim, or use a hot tub until your health care provider approves. This depends on the type of wound you have from surgery. While bathing, clean your buttocks area gently with soap and water. After bathing: Pat the area dry with a soft, clean towel. Cover the area with a clean bandage (dressing), if told to by your health care provider. General instructions  If you are taking prescription pain medicine, take actions to prevent or treat constipation. Your health care provider may recommend that you: Drink enough fluid to keep your urine pale yellow. Eat foods that are high in fiber, such as fresh fruits and vegetables, whole grains, and beans. Limit foods that are  high in fat and processed sugars, such as fried or sweet foods. Take an over-the-counter or prescription medicine for constipation. You will need to have a caregiver help you manage wound care and dressing changes. Your caregiver should: Wash his or her hands with soap and water before changing your dressing. If soap and water are not available, your caregiver should use hand sanitizer. Check your wound every day for signs of infection, such as: Redness, swelling, or more pain. More fluid or blood. Warmth. Pus or a bad smell. Follow any additional instructions from your health care provider on how to care for your wound, such as wound cleaning, wound flushing (irrigation), or packing your wound with a dressing. Keep all follow-up visits as told by your health care provider. This is important. If you had incision and drainage with wound packing: Return to your health care provider as instructed to have your packing material changed or removed. Keep the area dry until your packing has been removed. After the packing has been removed, you may start taking showers. If you had marsupialization: You may start taking showers the day after surgery, or when your health care provider approves. Remove your dressing before you shower, but let the water from the shower moisten your dressing before you remove it. This will make it easier to remove. Ask your health care provider when you can stop using a dressing. If you had incision and drainage without wound packing: Change your dressing as directed. Leave stitches (sutures), skin glue, or adhesive strips in place. These skin closures may need to stay  in place for 2 weeks or longer. If adhesive strip edges start to loosen and curl up, you may trim the loose edges. Do not remove adhesive strips completely unless your health care provider tells you to do that. Contact a health care provider if: You have redness, swelling, or more pain around your wound. You  have more fluid or blood coming from your wound. You have new bleeding from your wound. Your wound feels warm to the touch. There is pus or a bad smell coming from your wound. You have pain that does not get better with medicine. You have a fever or chills. You have muscle aches. You are dizzy. You feel generally sick. Summary After a procedure to drain a pilonidal cyst, it is common to have some fluid or blood coming from your wound. If you were prescribed an antibiotic medicine, take it as told by your health care provider. Do not stop taking the antibiotic even if you start to feel better. Return to your health care provider as instructed to have any packing material changed or removed. This information is not intended to replace advice given to you by your health care provider. Make sure you discuss any questions you have with your health care provider. Document Revised: 09/10/2020 Document Reviewed: 09/10/2020 Elsevier Patient Education  [place].

## 2022-05-12 NOTE — Telephone Encounter (Signed)
Copied from [place] [phone]. Topic: General - Other >> May 12, 2022 10:55 AM Cyndi Bender wrote: Reason for CRM: Pt stated he has 3 missed calls from the office yesterday so he was calling back to see why he was being contacted.

## 2022-05-12 NOTE — Chronic Care Management (AMB) (Signed)
Chronic Care Management    Clinical Social Work Note  05/12/2022 Name: Cory Hayes MRN: [id number] DOB: 05-30-1976  Cory Hayes is a 46 y.o. year old male who is a primary care patient of Vigg, Avanti, MD. The CCM team was consulted to assist the patient with chronic disease management and/or care coordination needs related to: [place] and Resources.   Engaged with patient's mother by telephone for initial visit in response to provider referral for social work chronic care management and care coordination services.   Consent to Services:  The patient was given the following information about Chronic Care Management services today, agreed to services, and gave verbal consent: 1. CCM service includes personalized support from designated clinical staff supervised by the primary care provider, including individualized plan of care and coordination with other care providers 2. 24/7 contact phone numbers for assistance for urgent and routine care needs. 3. Service will only be billed when office clinical staff spend 20 minutes or more in a month to coordinate care. 4. Only one practitioner may furnish and bill the service in a calendar month. 5.The patient may stop CCM services at any time (effective at the end of the month) by phone call to the office staff. 6. The patient will be responsible for cost sharing (co-pay) of up to 20% of the service fee (after annual deductible is met). Patient agreed to services and consent obtained.  Patient agreed to services and consent obtained.   Assessment: Review of patient past medical history, allergies, medications, and health status, including review of relevant consultants reports was performed today as part of a comprehensive evaluation and provision of chronic care management and care coordination services.     SDOH (Social Determinants of Health) assessments and interventions performed:    Advanced Directives Status: Not addressed in  this encounter.  CCM Care Plan  Allergies  Allergen Reactions   Lorazepam     High fever    Outpatient Encounter Medications as of 05/09/2022  Medication Sig Note   acetaminophen (TYLENOL) 500 MG tablet Take 500 mg by mouth every 6 (six) hours as needed.    ascorbic acid (VITAMIN C) 500 MG tablet Take by mouth.    cloBAZam (ONFI) 10 MG tablet Take 1 tablet by mouth 2 (two) times daily. 01/21/2021: Must come from [facility]   cyanocobalamin 1000 MCG tablet Take by mouth.    divalproex (DEPAKOTE) 250 MG DR tablet Take 5 tablets by mouth 2 (two) times daily.    lactobacillus acidophilus & bulgar (LACTINEX) chewable tablet Chew 1 tablet by mouth 3 (three) times daily with meals.    lactobacillus acidophilus & bulgar (LACTINEX) chewable tablet Chew 1 tablet by mouth as needed (take it after every stools).    levETIRAcetam (KEPPRA) 1000 MG tablet Take 2,000 mg by mouth 2 (two) times daily.    lidocaine (LIDODERM) 5 % Place 1 patch onto the skin daily. Remove & Discard patch within 12 hours or as directed by MD    lomustine (CEENU) 40 MG capsule Every 6 weeks.    melatonin 3 MG TABS tablet Take 1 tablet by mouth daily as needed.    metaxalone (SKELAXIN) 400 MG tablet Take 0.5-1 tablets (200-400 mg total) by mouth at bedtime.    ondansetron (ZOFRAN) 4 MG tablet Take 4 mg by mouth every 8 (eight) hours as needed.    pantoprazole (PROTONIX) 40 MG tablet Take 1 tablet (40 mg total) by mouth daily.    Potassium Chloride ER 20  MEQ TBCR TAKE 2 TABLETS (40 MEQ TOTAL) BY MOUTH 2 (TWO) TIMES DAILY WITH MEALS FOR 30 DAYS (Patient taking differently: Take 20 mEq by mouth daily.)    sodium chloride 1 g tablet Take 1 g by mouth 2 (two) times daily with a meal.    No facility-administered encounter medications on file as of 05/09/2022.    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   Cellulitis and abscess of trunk 04/05/2022    Fall 12/22/2021   Pilonidal cyst 12/13/2021   Depression 12/01/2021   Diarrhea 11/18/2021   Nausea 11/18/2021   Unspecified convulsions (HCC) 09/29/2021   Chronic pain syndrome 03/15/2021   Pharmacologic therapy 03/15/2021   Disorder of skeletal system 03/15/2021   Problems influencing health status 03/15/2021   Long term prescription benzodiazepine use (Clobazam) 03/15/2021   History of thoracic spinal fusion (T3 thru T7) 03/15/2021   Anxiety 03/14/2021   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HCC) 01/21/2021   Epilepsy (HCC) 01/21/2021   Hyponatremia 12/22/2020   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   Research exam 11/26/2020    Conditions to be addressed/monitored: Anxiety and Depression; ADL IADL limitations and Mental Health Concerns   Care Plan : LCSW Plan of Care  Updates made by Lewis, Jasmine D, LCSW since 05/12/2022 12:00 AM     Problem: Coping Skills (General Plan of Care)      Long-Range Goal: Coping Skills Enhanced   Start Date: 05/09/2022  Expected End Date: 07/14/2022  This Visit's Progress: On track  Priority: High  Note:   Current Barriers:  Mental Health Concerns    CSW Clinical Goal(s):  Patient  and Caregiver  will demonstrate a reduction in symptoms related to :Depression: anxiety and Chronic Pain Syndrome  through collaboration with Clinical Social Worker, provider, and care team.   Interventions: Pt has difficulty managing mental health symptoms triggered by chronic health conditions. Denies SI/HI Healthy coping skills to assist with stress management identified  Reviewed upcoming appointments [employer] will only cover family counseling under hospice care. CCM LCSW encouraged pt's mother to inquire about coverage regarding individual counseling noting that a counselor could include family in future sessions. Pt's mother has also contacted prior counselor, with [facility], to inquire about additional sessions and financial assistance Pt receives strong support from family Physical Therapy through  [facility]. Conducts Occupational and Speech therapy through [facility] Caregiver identified her triggers and identified coping skills to assist with stress management 1:1 collaboration with primary care provider regarding development and update of comprehensive plan of care as evidenced by provider attestation and co-signature Inter-disciplinary care team collaboration (see longitudinal plan of care) Evaluation of current treatment plan related to  self management and patient's adherence to plan as established by provider Active listening / Reflection utilized  Emotional Support Provided Quality of sleep assessed & Sleep Hygiene techniques promoted  Caregiver stress acknowledged  Verbalization of feelings encouraged   Task & activities to accomplish goals: Attend scheduled provider appts Utilize healthy coping skills and resources discussed Contact PCP office with any questions or concerns       Follow Up Plan: SW will follow up with patient by phone over the next 4-6 weeks      Jasmine Lewis, MSW, LCSW [facility] Care Management [place]  [facility] [email or web address] Phone [phone] 10:03 AM    

## 2022-05-12 NOTE — Patient Instructions (Signed)
Visit Information  Thank you for taking time to visit with me today. Please don't hesitate to contact me if I can be of assistance to you before our next scheduled telephone appointment.  Following are the goals we discussed today:  Task & activities to accomplish goals: Attend scheduled provider appts Utilize healthy coping skills and resources discussed Contact PCP office with any questions or concerns  Our next appointment is by telephone on 06/06/22 at 10:45 AM  Please call the care guide team at [phone] if you need to cancel or reschedule your appointment.   If you are experiencing a Mental Health or [place] or need someone to talk to, please call the Suicide and Crisis Lifeline: 988 call 911   Following is a copy of your full plan of care:  Care Plan : LCSW Plan of Care  Updates made by Cory Chesterfield, LCSW since 05/12/2022 12:00 AM     Problem: Coping Skills (General Plan of Care)      Long-Range Goal: Coping Skills Enhanced   Start Date: 05/09/2022  Expected End Date: 07/14/2022  This Visit's Progress: On track  Priority: High  Note:   Current Barriers:  Mental Health Concerns    CSW Clinical Goal(s):  Patient  and Caregiver  will demonstrate a reduction in symptoms related to :Depression: anxiety and Chronic Pain Syndrome  through collaboration with Clinical Social Worker, provider, and care team.   Interventions: Pt has difficulty managing mental health symptoms triggered by chronic health conditions. Denies SI/HI Healthy coping skills to assist with stress management identified  Reviewed upcoming appointments Christella Scheuermann will only cover family counseling under hospice care. CCM LCSW encouraged pt's mother to inquire about coverage regarding individual counseling noting that a counselor could include family in future sessions. Pt's mother has also contacted prior counselor, with [facility], to inquire about additional sessions and financial assistance Pt  receives strong support from family Physical Therapy through [facility]. Conducts Occupational and Speech therapy through [facility] identified her triggers and identified coping skills to assist with stress management 1:1 collaboration with primary care provider regarding development and update of comprehensive plan of care as evidenced by provider attestation and co-signature Inter-disciplinary care team collaboration (Hayes longitudinal plan of care) Evaluation of current treatment plan related to  self management and patient's adherence to plan as established by provider Active listening / Reflection utilized  Emotional Support Provided Quality of sleep assessed & Sleep Hygiene techniques promoted  Caregiver stress acknowledged  Verbalization of feelings encouraged   Task & activities to accomplish goals: Attend scheduled provider appts Utilize healthy coping skills and resources discussed Contact PCP office with any questions or concerns      Mr. Mauch was given information about Care Management services by the embedded care coordination team including:  Care Management services include personalized support from designated clinical staff supervised by his physician, including individualized plan of care and coordination with other care providers 24/7 contact phone numbers for assistance for urgent and routine care needs. The patient may stop CCM services at any time (effective at the end of the month) by phone call to the office staff.  Patient agreed to services and verbal consent obtained.   The patient verbalized understanding of instructions, educational materials, and care plan provided today and DECLINED offer to receive copy of patient instructions, educational materials, and care plan.   Cory Hayes, MSW, [email or web address] Phone [phone] 9:59 AM

## 2022-05-12 NOTE — Progress Notes (Signed)
[facility] POST-OP OFFICE VISIT  05/12/2022  HPI: Cory Hayes is a 46 y.o. male returning in follow-up of a sacral wound, and a potentially new buttock dermal cyst lesion.  His care provider noted some drainage.  Some scarring in the area of concern.  She has been changing the packing every other day with some mucousy discharge present.  No new nausea, vomiting, fevers or chills.  Vital signs: BP 116/82   Pulse 73   Temp 98.2 F (36.8 C) (Oral)   Wt 117 lb (53.1 kg)   SpO2 99%   BMI 17.79 kg/m    Physical Exam: Constitutional: He appears at baseline.  Admits he may have gained some weight Skin: The sacral wound appears to be quite clean and without evidence of inflammatory changes.  There is a centimeter or more of inferiorly oriented undermining, that needs to be addressed with his packing.  There is a small punctum on the left buttock with some associated scarring, but no apparent residual cyst worthy of surgical pursuit.  Assessment/Plan: This is a 46 y.o. male who remains remarkably debilitated secondary to the neurological tumor and the treatment.  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/2022   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   Cellulitis and abscess of trunk 04/05/2022   Fall 12/22/2021   Pilonidal cyst 12/13/2021   Depression 12/01/2021   Diarrhea 11/18/2021   Nausea 11/18/2021   Unspecified convulsions ([place]) 09/29/2021   Chronic pain syndrome 03/15/2021   Pharmacologic therapy 03/15/2021   Disorder of skeletal system 03/15/2021   Problems influencing health status 03/15/2021   Long term prescription benzodiazepine use (Clobazam) 03/15/2021   History of thoracic spinal fusion (T3 thru T7) 03/15/2021   Anxiety 03/14/2021   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[place]) 01/21/2021   Epilepsy ([place]) 01/21/2021   Hyponatremia 12/22/2020   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  And reassurance for the newly identified dermal lesion.  We remain readily available to follow-up.   Ronny Bacon M.D., FACS 05/12/2022, 11:42 AM

## 2022-05-12 NOTE — Chronic Care Management (AMB) (Deleted)
Care Management Clinical Social Work Note  05/12/2022 Name: Cory Hayes MRN: [id number] DOB: [birth year]  Cory Hayes is a 46 y.o. year old male who is a primary care patient of Vigg, Avanti, MD.  The Care Management team was consulted for assistance with chronic disease management and coordination needs.  Engaged with patient's mother by telephone for initial visit in response to provider referral for social work chronic care management and care coordination services  Consent to Services:  Mr. Krach was given information about Care Management services today including:  Care Management services includes personalized support from designated clinical staff supervised by his physician, including individualized plan of care and coordination with other care providers 24/7 contact phone numbers for assistance for urgent and routine care needs. The patient may stop case management services at any time by phone call to the office staff.  Patient agreed to services and consent obtained.   Assessment: Review of patient past medical history, allergies, medications, and health status, including review of relevant consultants reports was performed today as part of a comprehensive evaluation and provision of chronic care management and care coordination services.  SDOH (Social Determinants of Health) assessments and interventions performed:    Advanced Directives Status: Not addressed in this encounter.  Care Plan  Allergies  Allergen Reactions   Lorazepam     High fever    Outpatient Encounter Medications as of 05/09/2022  Medication Sig Note   acetaminophen (TYLENOL) 500 MG tablet Take 500 mg by mouth every 6 (six) hours as needed.    ascorbic acid (VITAMIN C) 500 MG tablet Take by mouth.    cloBAZam (ONFI) 10 MG tablet Take 1 tablet by mouth 2 (two) times daily. 01/21/2021: Must come from [employer]   cyanocobalamin 1000 MCG tablet Take by mouth.    divalproex (DEPAKOTE) 250 MG DR tablet Take 5  tablets by mouth 2 (two) times daily.    lactobacillus acidophilus & bulgar (LACTINEX) chewable tablet Chew 1 tablet by mouth 3 (three) times daily with meals.    lactobacillus acidophilus & bulgar (LACTINEX) chewable tablet Chew 1 tablet by mouth as needed (take it after every stools).    levETIRAcetam (KEPPRA) 1000 MG tablet Take 2,000 mg by mouth 2 (two) times daily.    lidocaine (LIDODERM) 5 % Place 1 patch onto the skin daily. Remove & Discard patch within 12 hours or as directed by MD    lomustine (CEENU) 40 MG capsule Every 6 weeks.    melatonin 3 MG TABS tablet Take 1 tablet by mouth daily as needed.    metaxalone (SKELAXIN) 400 MG tablet Take 0.5-1 tablets (200-400 mg total) by mouth at bedtime.    ondansetron (ZOFRAN) 4 MG tablet Take 4 mg by mouth every 8 (eight) hours as needed.    pantoprazole (PROTONIX) 40 MG tablet Take 1 tablet (40 mg total) by mouth daily.    Potassium Chloride ER 20 MEQ TBCR TAKE 2 TABLETS (40 MEQ TOTAL) BY MOUTH 2 (TWO) TIMES DAILY WITH MEALS FOR 30 DAYS (Patient taking differently: Take 20 mEq by mouth daily.)    sodium chloride 1 g tablet Take 1 g by mouth 2 (two) times daily with a meal.    No facility-administered encounter medications on file as of 05/09/2022.    Patient Active Problem List   Diagnosis Date Noted   Anaplastic astrocytoma ([place]) 05/05/2022   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   Cellulitis and abscess of trunk 04/05/2022   Fall 12/22/2021   Pilonidal cyst 12/13/2021  Depression 12/01/2021   Diarrhea 11/18/2021   Nausea 11/18/2021   Unspecified convulsions ([place]) 09/29/2021   Chronic pain syndrome 03/15/2021   Pharmacologic therapy 03/15/2021   Disorder of skeletal system 03/15/2021   Problems influencing health status 03/15/2021   Long term prescription benzodiazepine use (Clobazam) 03/15/2021   History of thoracic spinal fusion (T3 thru T7) 03/15/2021   Anxiety 03/14/2021   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[place]) 01/21/2021   Epilepsy ([place]) 01/21/2021    Hyponatremia 12/22/2020   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   Research exam 11/26/2020    Conditions to be addressed/monitored: Anxiety and Depression; ADL IADL limitations and Mental Health Concerns   Care Plan : LCSW Plan of Care  Updates made by Rebekah Chesterfield, LCSW since 05/12/2022 12:00 AM     Problem: Coping Skills (General Plan of Care)      Long-Range Goal: Coping Skills Enhanced   Start Date: 05/09/2022  Expected End Date: 07/14/2022  This Visit's Progress: On track  Priority: High  Note:   Current Barriers:  Mental Health Concerns    CSW Clinical Goal(s):  Patient  and Caregiver  will demonstrate a reduction in symptoms related to :Depression: anxiety and Chronic Pain Syndrome  through collaboration with Clinical Social Worker, provider, and care team.   Interventions: Pt has difficulty managing mental health symptoms triggered by chronic health conditions. Denies SI/HI Healthy coping skills to assist with stress management identified  Reviewed upcoming appointments [facility] Scheu[facility] will only cover family counseling under hospice care. CCM LCSW encouraged pt's mother to inquire about coverage regarding individual counseling noting that a counselor could include family in future sessions. Pt's mother has also contacted prior counselor, with [facility], to inquire about additional sessions and financial assistance Pt receives strong support from family Physical Therapy through [facility]. Conducts Occupational and Speech therapy through [facility] identified her triggers and identified coping skills to assist with stress management 1:1 collaboration with primary care provider regarding development and update of comprehensive plan of care as evidenced by provider attestation and co-signature Inter-disciplinary care team collaboration (see longitudinal plan of care) Evaluation of current treatment plan related to  self management and patient's adherence to plan as established by  provider Active listening / Reflection utilized  Emotional Support Provided Quality of sleep assessed & Sleep Hygiene techniques promoted  Caregiver stress acknowledged  Verbalization of feelings encouraged   Task & activities to accomplish goals: Attend scheduled provider appts Utilize healthy coping skills and resources discussed Contact PCP office with any questions or concerns       Follow Up Plan: SW will follow up with patient by phone over the next 4-6 weeks  Christa See, MSW, [email or web address] Phone [phone] 9:59 AM

## 2022-05-13 ENCOUNTER — Ambulatory Visit
Admission: RE | Admit: 2022-05-13 | Discharge: 2022-05-13 | Disposition: A | Discharge status: Home / Self Care | Payer: Medicare (Managed Care) | Source: Ambulatory Visit | Attending: Internal Medicine | Admitting: Internal Medicine

## 2022-05-13 DIAGNOSIS — K769 Liver disease, unspecified: Secondary | ICD-10-CM | POA: Insufficient documentation

## 2022-05-13 DIAGNOSIS — I700 Atherosclerosis of aorta: Secondary | ICD-10-CM | POA: Diagnosis not present

## 2022-05-13 DIAGNOSIS — R197 Diarrhea, unspecified: Secondary | ICD-10-CM | POA: Diagnosis not present

## 2022-05-13 DIAGNOSIS — K7689 Other specified diseases of liver: Secondary | ICD-10-CM | POA: Diagnosis not present

## 2022-05-13 DIAGNOSIS — F339 Major depressive disorder, recurrent, unspecified: Secondary | ICD-10-CM

## 2022-05-13 DIAGNOSIS — N3289 Other specified disorders of bladder: Secondary | ICD-10-CM | POA: Diagnosis not present

## 2022-05-15 ENCOUNTER — Encounter: Payer: Self-pay | Admitting: Nurse Practitioner

## 2022-05-15 DIAGNOSIS — I700 Atherosclerosis of aorta: Secondary | ICD-10-CM | POA: Insufficient documentation

## 2022-05-15 NOTE — Progress Notes (Signed)
Good morning Cory Hayes, this is Dr. Neomia Dear patient -- she ordered CT on her on 05/11/22.  Please let her know results.  FYI for [name], you see her 05/24/22 (may need to order MRI plus is in need of colonoscopy I see).  Cory Hayes let her know: Good day Cory Hayes, your imaging has returned.  This showed no acute findings, but did show cysts to liver area with recommendation for MRI if ongoing issues or based on clinical exam.  I see you are scheduled to see [name] in office 05/24/22, please maintain this appointment.  Dr. Neomia Dear also recommend you schedule with GI, have you done this?  I would also recommend you let your oncologist know about this imaging to see if any recommendations from them.  Any questions?

## 2022-05-18 ENCOUNTER — Telehealth: Payer: Self-pay | Admitting: Internal Medicine

## 2022-05-18 DIAGNOSIS — K769 Liver disease, unspecified: Secondary | ICD-10-CM

## 2022-05-18 DIAGNOSIS — K7689 Other specified diseases of liver: Secondary | ICD-10-CM

## 2022-05-18 DIAGNOSIS — C719 Malignant neoplasm of brain, unspecified: Secondary | ICD-10-CM | POA: Diagnosis not present

## 2022-05-18 DIAGNOSIS — R197 Diarrhea, unspecified: Secondary | ICD-10-CM | POA: Diagnosis not present

## 2022-05-18 NOTE — Telephone Encounter (Signed)
Requesting a MRI of the abdomin please place orders at [place] location. Patient had a cat scan on 05/13/2022 that detected a cyst on his liver and gallstones.    Requesting another GI referral because [facility] can not see patient until  09/2022.  Caller would like a follow up call.

## 2022-05-19 ENCOUNTER — Ambulatory Visit: Payer: Medicare (Managed Care) | Admitting: Occupational Therapy

## 2022-05-19 ENCOUNTER — Ambulatory Visit: Payer: Medicare (Managed Care) | Admitting: Speech Pathology

## 2022-05-19 NOTE — Telephone Encounter (Signed)
Routing to provider and referral coordinator.   Jolene- can we go ahead and order the MRI or does the patient need to wait until he sees [name]?  Evelena Peat- is it possible to refer the patient to another location who may be able to see the patient sooner?

## 2022-05-24 ENCOUNTER — Telehealth: Payer: Self-pay

## 2022-05-24 ENCOUNTER — Ambulatory Visit (INDEPENDENT_AMBULATORY_CARE_PROVIDER_SITE_OTHER): Payer: Medicare (Managed Care) | Admitting: Unknown Physician Specialty

## 2022-05-24 ENCOUNTER — Ambulatory Visit: Payer: Medicare (Managed Care) | Admitting: Physical Therapy

## 2022-05-24 ENCOUNTER — Ambulatory Visit: Payer: Medicare (Managed Care) | Admitting: Occupational Therapy

## 2022-05-24 ENCOUNTER — Encounter: Payer: Self-pay | Admitting: Unknown Physician Specialty

## 2022-05-24 VITALS — BP 125/89 | HR 77 | Temp 98.6°F | Ht 67.99 in | Wt 117.0 lb

## 2022-05-24 DIAGNOSIS — R64 Cachexia: Secondary | ICD-10-CM | POA: Diagnosis not present

## 2022-05-24 DIAGNOSIS — K769 Liver disease, unspecified: Secondary | ICD-10-CM

## 2022-05-24 DIAGNOSIS — R197 Diarrhea, unspecified: Secondary | ICD-10-CM

## 2022-05-24 DIAGNOSIS — G894 Chronic pain syndrome: Secondary | ICD-10-CM | POA: Diagnosis not present

## 2022-05-24 DIAGNOSIS — Z981 Arthrodesis status: Secondary | ICD-10-CM | POA: Diagnosis not present

## 2022-05-24 NOTE — Progress Notes (Signed)
BP 125/89   Pulse 77   Temp 98.6 F (37 C) (Oral)   Ht 5' 7.99" (1.727 m)   Wt 117 lb (53.1 kg)   SpO2 98%   BMI 17.79 kg/m    Subjective:    Patient ID: Cory Hayes, male    DOB: 18-Nov-1975, 46 y.o.   MRN: [id number]  HPI: Cory Hayes is a 46 y.o. male  Chief Complaint  Patient presents with   Diarrhea    F/U on chronic Diarrhea, was sent for CT scan of ABD, found lesions on liver   Back Pain   Urine incont.    Having a hard time to control bladder.    Chronic diarrhea treated by Dr. Neomia Dear 6/22 with labs and abdominal  CT done.  Cysts on liver noted and a MRI has been ordered.  Due on 7/19.  He is also under the care of a oncologist due to a history of a neurological tumor. Still under the care of oncology.    Diarrhea is better off of probiotics and Vitamin C.    Ongoing back pain upper back following a seizure when he found himself at the bottom of the stairs.  He is currently having constant pain mid back. X-rays were done 2 months ago and no acute changes thoracic spine with old compression fracture and spinal hardware.    Was seen for back pain 5/19 and treated with lidoderm, skelaxin, and Tylenol.  This is getting worse and is not incontinent  Liver lesions: Oncologist wants him to be seen urgently to evaluate liver lesions  Relevant past medical, surgical, family and social history reviewed and updated as indicated. Interim medical history since our last visit reviewed. Allergies and medications reviewed and updated.  Review of Systems  Per HPI unless specifically indicated above     Objective:    BP 125/89   Pulse 77   Temp 98.6 F (37 C) (Oral)   Ht 5' 7.99" (1.727 m)   Wt 117 lb (53.1 kg)   SpO2 98%   BMI 17.79 kg/m   Wt Readings from Last 3 Encounters:  05/24/22 117 lb (53.1 kg)  05/12/22 117 lb (53.1 kg)  04/21/22 123 lb (55.8 kg)    Physical Exam Constitutional:      General: He is not in acute distress.    Appearance: He is  well-developed. He is cachectic. He is ill-appearing.  HENT:     Head: Normocephalic and atraumatic.  Eyes:     General: Lids are normal. No scleral icterus.       Right eye: No discharge.        Left eye: No discharge.     Conjunctiva/sclera: Conjunctivae normal.  Neck:     Vascular: No carotid bruit or JVD.  Cardiovascular:     Rate and Rhythm: Normal rate and regular rhythm.     Heart sounds: Normal heart sounds.  Pulmonary:     Effort: Pulmonary effort is normal. No respiratory distress.     Breath sounds: Normal breath sounds.  Abdominal:     Palpations: There is no hepatomegaly or splenomegaly.  Musculoskeletal:        General: Normal range of motion.     Cervical back: Normal range of motion and neck supple.  Skin:    General: Skin is warm and dry.     Coloration: Skin is not pale.     Findings: No rash.  Neurological:     Mental Status: He is  alert and oriented to person, place, and time.  Psychiatric:        Behavior: Behavior normal.        Thought Content: Thought content normal.        Judgment: Judgment normal.     Results for orders placed or performed in visit on 04/26/22  Cdiff NAA+O+P+Stool Culture   Specimen: Stool   ST  Result Value Ref Range   Salmonella/Shigella Screen Final report    Stool Culture result 1 (RSASHR) Comment    Campylobacter Culture Final report    Stool Culture result 1 (CMPCXR) Comment    E coli, Shiga toxin Assay Negative Negative   OVA + PARASITE EXAM Final report    O&P result 1 Comment    Toxigenic C. Difficile by PCR Negative Negative  Novel Coronavirus, NAA ([facility])   Specimen: Nasopharyngeal(NP) swabs in vial transport medium  Result Value Ref Range   SARS-CoV-2, NAA Not Detected Not Detected  CBC with Differential/Platelet  Result Value Ref Range   WBC 2.6 (L) 3.4 - 10.8 x10E3/uL   RBC 3.36 (L) 4.14 - 5.80 x10E6/uL   Hemoglobin 12.6 (L) 13.0 - 17.7 g/dL   Hematocrit 36.2 (L) 37.5 - 51.0 %   MCV 108 (H) 79 - 97 fL    MCH 37.5 (H) 26.6 - 33.0 pg   MCHC 34.8 31.5 - 35.7 g/dL   RDW 13.3 11.6 - 15.4 %   Platelets 140 (L) 150 - 450 x10E3/uL   Neutrophils 46 Not Estab. %   Lymphs 42 Not Estab. %   Monocytes 10 Not Estab. %   Eos 1 Not Estab. %   Basos 0 Not Estab. %   Neutrophils Absolute 1.2 (L) 1.4 - 7.0 x10E3/uL   Lymphocytes Absolute 1.1 0.7 - 3.1 x10E3/uL   Monocytes Absolute 0.3 0.1 - 0.9 x10E3/uL   EOS (ABSOLUTE) 0.0 0.0 - 0.4 x10E3/uL   Basophils Absolute 0.0 0.0 - 0.2 x10E3/uL   Immature Granulocytes 1 Not Estab. %   Immature Grans (Abs) 0.0 0.0 - 0.1 x10E3/uL  Comprehensive metabolic panel  Result Value Ref Range   Glucose 109 (H) 70 - 99 mg/dL   BUN 13 6 - 24 mg/dL   Creatinine, Ser 0.75 (L) 0.76 - 1.27 mg/dL   eGFR 114 >59 mL/min/1.73   BUN/Creatinine Ratio 17 9 - 20   Sodium 139 134 - 144 mmol/L   Potassium 4.2 3.5 - 5.2 mmol/L   Chloride 104 96 - 106 mmol/L   CO2 22 20 - 29 mmol/L   Calcium 8.9 8.7 - 10.2 mg/dL   Total Protein 5.9 (L) 6.0 - 8.5 g/dL   Albumin 3.3 (L) 4.0 - 5.0 g/dL   Globulin, Total 2.6 1.5 - 4.5 g/dL   Albumin/Globulin Ratio 1.3 1.2 - 2.2   Bilirubin Total 0.4 0.0 - 1.2 mg/dL   Alkaline Phosphatase 66 44 - 121 IU/L   AST 15 0 - 40 IU/L   ALT 11 0 - 44 IU/L  Veritor Flu A/B Waived  Result Value Ref Range   Influenza A Negative Negative   Influenza B Negative Negative      Assessment & Plan:   Problem List Items Addressed This Visit       Unprioritized   Chronic pain syndrome (Chronic)    Chronic secondary to failed back and sarcopenia.  Will refer to neurosurgery with pt request.  Will need to continue working with PT and OT      Relevant Orders  Ambulatory referral to Neurosurgery   History of thoracic spinal fusion (T3 thru T7) (Chronic)   Relevant Orders   Ambulatory referral to Neurosurgery   Cachexia [facility])    Appetite seems to be returning.  Taking a "break" from cancer treatments following a positive MRI      Diarrhea   Other Visit  Diagnoses     Liver lesion    -  Primary   Multiple lesions.  Refer to GI ASAP per oncology request.     Relevant Orders   Ambulatory referral to Gastroenterology        Follow up plan: Return in about 6 weeks (around 07/05/2022).

## 2022-05-24 NOTE — Telephone Encounter (Signed)
We have received a referral from [facility] for Chronic pain syndrome. Please schedule with Danielle. No PT, ESIs or MRI.

## 2022-05-24 NOTE — Assessment & Plan Note (Signed)
Appetite seems to be returning.  Taking a "break" from cancer treatments following a positive MRI

## 2022-05-24 NOTE — Assessment & Plan Note (Signed)
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  Will refer to neurosurgery with pt request.  Will need to continue working with PT and OT

## 2022-05-26 ENCOUNTER — Ambulatory Visit: Payer: Medicare (Managed Care) | Attending: Nurse Practitioner | Admitting: Speech Pathology

## 2022-05-26 ENCOUNTER — Ambulatory Visit: Payer: Medicare (Managed Care) | Admitting: Physical Therapy

## 2022-05-26 ENCOUNTER — Ambulatory Visit: Payer: Medicare (Managed Care) | Admitting: Occupational Therapy

## 2022-05-26 ENCOUNTER — Encounter: Payer: Self-pay | Admitting: Occupational Therapy

## 2022-05-26 DIAGNOSIS — R41841 Cognitive communication deficit: Secondary | ICD-10-CM | POA: Diagnosis not present

## 2022-05-26 DIAGNOSIS — R471 Dysarthria and anarthria: Secondary | ICD-10-CM | POA: Diagnosis not present

## 2022-05-26 DIAGNOSIS — R278 Other lack of coordination: Secondary | ICD-10-CM | POA: Diagnosis not present

## 2022-05-26 DIAGNOSIS — M6281 Muscle weakness (generalized): Secondary | ICD-10-CM

## 2022-05-26 DIAGNOSIS — C719 Malignant neoplasm of brain, unspecified: Secondary | ICD-10-CM | POA: Insufficient documentation

## 2022-05-26 NOTE — Therapy (Signed)
OUTPATIENT SPEECH LANGUAGE PATHOLOGY TREATMENT NOTE   Patient Name: Cory Hayes MRN: [id number] DOB:1976-10-02, 46 y.o., male Today's Date: 05/26/2022   REFERRING PROVIDER: Alpha Gula, NP   End of Session - 05/26/22 1707     Visit Number 4    Number of Visits 13    Date for SLP Re-Evaluation 07/04/22    SLP Start Time 1600    SLP Stop Time  1700    SLP Time Calculation (min) 60 min    Activity Tolerance Patient tolerated treatment well             Past Medical History:  Diagnosis Date   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[place]) 2008   Seizures [facility])    Past Surgical History:  Procedure Laterality Date   BACK SURGERY  2017   CRANIOTOMY  2008, 2015, 12/21/20   Patient Active Problem List   Diagnosis Date Noted   Cachexia ([place]) 05/24/2022   Aortic atherosclerosis ([place]) 05/15/2022   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   Cellulitis and abscess of trunk 04/05/2022   Fall 12/22/2021   Pilonidal cyst 12/13/2021   Depression 12/01/2021   Diarrhea 11/18/2021   Nausea 11/18/2021   Unspecified convulsions ([place]) 09/29/2021   Chronic pain syndrome 03/15/2021   Pharmacologic therapy 03/15/2021   Disorder of skeletal system 03/15/2021   Problems influencing health status 03/15/2021   Long term prescription benzodiazepine use ([place]) 03/15/2021   History of thoracic spinal fusion (T3 thru T7) 03/15/2021   Anxiety 03/14/2021   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[place]) 01/21/2021   Epilepsy ([place]) 01/21/2021   Hyponatremia 12/22/2020   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 Underwent craniotomy in 2015; also s/p radiation, chemotherapy, and was admitted at [facility] for right parietal crani and tumor  resection on 12/21/20. Post-operatively was evaluated for dysphagia by SLP, FEES on 12/24/20 with recommendation for NDD3/nectar liquids. Pt advanced to regular/thin by time of d/c from rehab.    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 No     OBJECTIVE:   TODAY'S TREATMENT: Patient has not been seen in [place] since 04/19/22 due to illness and other medical appointments; has MRI abdomen scheduled next week. Pt reports he is feeling better and motivated to attend future appointments. Reviewed goals which remain appropriate and in line with pt/family goals. Due to pt's reports of frustration with memory, SLP worked with pt on assessing and determining an appropriate memory compensation system. Due to difficulty writing, attempted use of pt's phone. Visual and alternating attention as well as dexterity deficits limit this from being a functional or usable system at this time. Attempted use of voice activation, however due to low vocal intensity pt's speech is not recognized by his device. Collaborated with pt and mother to generate daily schedule with space to add specific details for each day. Pt, mother instructed in updating this schedule daily and for pt to interact with schedule by checking off completed items.   PATIENT EDUCATION: Education details: use of schedule Person educated: Patient and Parent Education method: Explanation, Demonstration, and Verbal cues Education comprehension: verbalized  understanding and needs further education   SLP Short Term Goals - 05/26/22 1709       SLP SHORT TERM GOAL #1   Title Pt will completed standardized cognitive assessment and assessment of motor speech.    Time 2    Period Weeks    Status New      SLP SHORT TERM GOAL #2   Title  Pt will demonstrate HEP for motor speech >90% accuracy with min cues.    Time 6    Period Weeks    Status New      SLP SHORT TERM GOAL #3   Title Pt will initiate use of memory support system (external aid) and bring to 75% of therapy sessions.    Time 6    Period Weeks    Status New      SLP SHORT TERM GOAL #4   Title Patient will use intelligibility and fluency compensations in 5 minutes simple-mod complex conversation with rare min cues.    Time 6    Period Weeks    Status New              SLP Long Term Goals - 04/05/22 1805       SLP LONG TERM GOAL #1   Title Patient will report carryover of at least 3 memory strategies outside of ST.    Time 12    Period Weeks    Status New      SLP LONG TERM GOAL #2   Title Patient will increase vocal intensity in conversation to average >72 dB at 50 cm.    Baseline To be collected    Time 12    Period Weeks    Status New      SLP LONG TERM GOAL #3   Title Pt will use memory support system (external aid) to recall completed and upcoming appointments and birthdays >90% accuracy independently.    Time 12    Period Weeks    Status New      SLP LONG TERM GOAL #4   Title Pt will use intelligibility and fluency compensations effectively >90% of the time in 15 minutes mod complex- complex conversation.    Time 12    Period Weeks    Status New              Plan - 05/26/22 1708     Clinical Impression Statement Patient presents with moderate cognitive communication impairments, stuttering and mild dysarthria; these appear to be chronic impairments exacerbated by most recent craniotomy and tumor resection in February 2022. Impairments in attention and memory affect Pts higher level cognitive functioning skills. Pts demonstrates low vocal intensity with reduced breath support. Decreased volume greatly impacts intelligibility and causes communication breakdowns with family. Initiated training in use of memory compensation system  today; pt requires mod-max assist. Consistent attendance necessary in order to meet above goals as stated. Recommend ST to train Pt in compensatory cognitive strategies for memory, attention, and recall as well as exercises to improve vocal strength and breath support as well to improve communication with family.   Speech Therapy Frequency 1x /week    Duration 12 weeks    Treatment/Interventions Cognitive reorganization;Compensatory strategies;Multimodal communcation approach;Internal/external aids;Functional tasks;SLP instruction and feedback;Patient/family education    Potential to Achieve Goals Good    Potential Considerations Other (comment)   time post onset   Consulted and Agree with Plan of Care Patient;Family member/caregiver  Deneise Lever, [place], [employer] Pathologist [phone]   Aliene Altes, [place] 05/26/2022, 5:09 PM

## 2022-05-26 NOTE — Therapy (Signed)
OUTPATIENT OCCUPATIONAL THERAPY TREATMENT NOTE   Patient Name: Cory Hayes MRN: [id number] DOB:Sep 04, 1976, 46 y.o., male Today's Date: 05/26/2022  PCP: Charlynne Cousins, MD REFERRING PROVIDER: Charolette Forward, Jenny Reichmann, NP   OT End of Session - 05/26/22 1831     Visit Number 6    Number of Visits 24    Date for OT Re-Evaluation 06/23/22    Authorization Time Period progress reporting period starting 03/31/2022    OT Start Time 1515    OT Stop Time 1600    OT Time Calculation (min) 45 min    Activity Tolerance Patient tolerated treatment well    Behavior During Therapy [facility] for tasks assessed/performed             Past Medical History:  Diagnosis Date   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[place]) 2008   Seizures [facility])    Past Surgical History:  Procedure Laterality Date   BACK SURGERY  2017   CRANIOTOMY  2008, 2015, 12/21/20   Patient Active Problem List   Diagnosis Date Noted   Cachexia ([place]) 05/24/2022   Aortic atherosclerosis ([place]) 05/15/2022   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   Cellulitis and abscess of trunk 04/05/2022   Fall 12/22/2021   Pilonidal cyst 12/13/2021   Depression 12/01/2021   Diarrhea 11/18/2021   Nausea 11/18/2021   Unspecified convulsions ([place]) 09/29/2021   Chronic pain syndrome 03/15/2021   Pharmacologic therapy 03/15/2021   Disorder of skeletal system 03/15/2021   Problems influencing health status 03/15/2021   Long term prescription benzodiazepine use ([place]) 03/15/2021   History of thoracic spinal fusion (T3 thru T7) 03/15/2021   Anxiety 03/14/2021   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[place]) 01/21/2021   Epilepsy ([place]) 01/21/2021   Hyponatremia 12/22/2020   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 is a 46 y.o. 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 Pt. underwent  a craniotomy in 2015, and a resection in 12/2020. Pt. has received chemotherapy, and radiation. Pt. reports sustaining a fracture of the thoracic vertebrae in 2017. Pt. endorses a history of nmultiple falls, reporting approximately 20 in the last 6 months with his last fall being 2 days ago. Pt. resides with his mother, and66 y.o. son who assist pt. with daily care needs. Pt. enjoys reading, wathcing YouTube, cooking, and spending time with his family  PRECAUTIONS: Fall  SUBJECTIVE:  Pt. Reports being scheduled for a CT scan of his abdomen this week.  PAIN:  Are you having pain? Yes: 8/10","Pain location: scarum,"Pain description: wound pain     OBJECTIVE:   TODAY'S TREATMENT:    Pt. Reports that he has a referral for an orthopedic  physician for his back. Pt. Reports that that his diarrhea is improving. Pt. and caregiver education was provided about positioning of the left UE, and proper ROM techniques for the LUE. Pt. tolerated slow prolonged gentle stretching of the bilateral pectoral musculature.  Pt. performed A/AAAROM, and PROM to the LUE, wrist, digit, and thumb radial, and palmar abduction Pt.'s caregiver was able to demonstrate proper technique  with verbal, and tactile cues for pectoral, and LUE stretches secondary to excessive forward head/neck flexion when sitting.  Pt. Presented with less lateral leaning today. Pt. Continues to work on improving LUE functioning in order to work towards  increasing engagement during ADLS, and IADL tasks.      Left: Grip strength: 15# Lateral pinch: 7#             3pt. Pinch: 4#      PATIENT EDUCATION: Education details: LUE functioning Person educated: Patient and Caregiver Education method: Explanation, Demonstration, Tactile cues, and Verbal cues Education comprehension: verbalized understanding, returned demonstration,  verbal cues required, tactile cues required, and needs further education   HOME EXERCISE PROGRAM Continue with ongoing HEP with updating as needed.    OT Short Term Goals - 03/31/22 1942       OT SHORT TERM GOAL #1   Title Pt. will improve FOTO to reflect pt. perceived progress with assessment specific ADLs, and IADL tasks.    Baseline Eval: TBD    Time 12    Period Weeks    Status New    Target Date 05/12/22              OT Long Term Goals - 03/31/22 1944       OT LONG TERM GOAL #1   Title Pt. will require minA UE dressing skills    Baseline Eval: MaxA    Time 12    Period Weeks    Status New    Target Date 06/23/22      OT LONG TERM GOAL #2   Title Pt. will require modA LE dressing skills    Baseline Eval: MaxA    Time 12    Period Weeks    Status New    Target Date 06/23/22      OT LONG TERM GOAL #3   Title Pt. will demonstrate transfers to the [place] with minA    Baseline Eval: [place], when fatigued MaxA    Time 12    Period Weeks    Status New    Target Date 06/23/22      OT LONG TERM GOAL #4   Title Pt. will be independent with a HEP for the LUE    Baseline Eval: No current HEPs    Time 12    Period Weeks    Status New    Target Date 06/23/22      OT LONG TERM GOAL #5   Title Pt. will demonstrate visual compensatory strategies 100% of the time during ADLs, and IADLs    Baseline Eval: Currently does not utilize    Time 12    Period Weeks    Status New    Target Date 06/23/22      Long Term Additional Goals   Additional Long Term Goals Yes      OT LONG TERM GOAL #6   Title Pt. will improve left grip strength by 5#  in order to be able to hold, and stabilize items with ADLS.    Baseline Eval: eft grip strength: 17#    Time 12    Period Weeks    Status New    Target Date 06/23/22      OT LONG TERM GOAL #7   Title pt. will improve left lateral pinch strength by 3# to be able to hold, and stabilize a toothbush.    Baseline Eval: Left  pinch strength 6#    Time 12    Period Weeks    Status New    Target Date 06/23/22              Plan - 05/10/22 1823     Clinical Impression Statement Pt. Reports  that he has a referral for an orthopedic  physician for his back. Pt. Reports that that his diarrhea is improving. Pt. and caregiver education was provided about positioning of the left UE, and proper ROM techniques for the LUE. Pt. tolerated slow prolonged gentle stretching of the bilateral pectoral musculature.  Pt. performed A/AAAROM, and PROM to the LUE, wrist, digit, and thumb radial, and palmar abduction Pt.'s caregiver was able to demonstrate proper technique  with verbal, and tactile cues for pectoral, and LUE stretches secondary to excessive forward head/neck flexion when sitting.  Pt. Presented with less lateral leaning today. Pt. Continues to work on improving LUE functioning in order to work towards increasing engagement during [employer], and IADL tasks.    .     OT Occupational Profile and History Comprehensive Assessment- Review of records and extensive additional review of physical, cognitive, psychosocial history related to current functional performance    Occupational performance deficits (Please refer to evaluation for details): IADL's;ADL's;Social Participation    Body Structure / Function / Physical Skills ADL;FMC;Endurance;Strength;ROM    Rehab Potential Good    Clinical Decision Making Multiple treatment options, significant modification of task necessary    Comorbidities Affecting Occupational Performance: Presence of comorbidities impacting occupational performance    Modification or Assistance to Complete Evaluation  Max significant modification of tasks or assist is necessary to complete    OT Frequency 2x / week    OT Duration 12 weeks    OT Treatment/Interventions Self-care/ADL training;Therapeutic exercise;Energy conservation;Neuromuscular education;DME and/or AE instruction;Moist Heat;Therapeutic  activities;Patient/family Educational psychologist;Contrast Bath;Passive range of motion;Cognitive remediation/compensation;Manual Therapy;Coping strategies training    Consulted and Agree with Plan of Care Family member/caregiver    Family Member Consulted Mother            Harrel Carina, MS, OTR/L   Harrel Carina, [place] 05/26/2022, 6:33 PM

## 2022-05-30 ENCOUNTER — Encounter: Payer: Self-pay | Admitting: Physician Assistant

## 2022-05-30 NOTE — Telephone Encounter (Signed)
Please schedule with Danielle. 

## 2022-05-31 ENCOUNTER — Ambulatory Visit: Payer: Medicare (Managed Care) | Admitting: Physical Therapy

## 2022-05-31 ENCOUNTER — Encounter: Payer: Medicare (Managed Care) | Admitting: Speech Pathology

## 2022-05-31 ENCOUNTER — Ambulatory Visit: Payer: Medicare (Managed Care) | Admitting: Occupational Therapy

## 2022-05-31 ENCOUNTER — Encounter: Payer: Self-pay | Admitting: Occupational Therapy

## 2022-05-31 DIAGNOSIS — M6281 Muscle weakness (generalized): Secondary | ICD-10-CM

## 2022-05-31 DIAGNOSIS — R278 Other lack of coordination: Secondary | ICD-10-CM

## 2022-05-31 DIAGNOSIS — R41841 Cognitive communication deficit: Secondary | ICD-10-CM | POA: Diagnosis not present

## 2022-05-31 NOTE — Therapy (Signed)
OUTPATIENT OCCUPATIONAL THERAPY TREATMENT NOTE   Patient Name: Cory Hayes MRN: [id number] DOB:[birth year], 46 y.o., male Today's Date: 05/31/2022  PCP: Charlynne Cousins, MD REFERRING PROVIDER: Charolette Forward, Jenny Reichmann, NP   OT End of Session - 05/31/22 1557     Visit Number 7    Number of Visits 24    Date for OT Re-Evaluation 06/23/22    Authorization Time Period progress reporting period starting 03/31/2022    OT Start Time 1435    OT Stop Time 1515    OT Time Calculation (min) 40 min    Activity Tolerance Patient tolerated treatment well    Behavior During Therapy [facility] for tasks assessed/performed             Past Medical History:  Diagnosis Date   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[place]) 2008   Seizures [facility])    Past Surgical History:  Procedure Laterality Date   BACK SURGERY  2017   CRANIOTOMY  2008, 2015, 12/21/20   Patient Active Problem List   Diagnosis Date Noted   Cachexia ([place]) 05/24/2022   Aortic atherosclerosis ([place]) 05/15/2022   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   Cellulitis and abscess of trunk 04/05/2022   Fall 12/22/2021   Pilonidal cyst 12/13/2021   Depression 12/01/2021   Diarrhea 11/18/2021   Nausea 11/18/2021   Unspecified convulsions ([place]) 09/29/2021   Chronic pain syndrome 03/15/2021   Pharmacologic therapy 03/15/2021   Disorder of skeletal system 03/15/2021   Problems influencing health status 03/15/2021   Long term prescription benzodiazepine use ([place]am) 03/15/2021   History of thoracic spinal fusion (T3 thru T7) 03/15/2021   Anxiety 03/14/2021   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[place]) 01/21/2021   Epilepsy ([place]) 01/21/2021   Hyponatremia 12/22/2020   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 is a 46 y.o. 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 Pt. underwent  a craniotomy in 2015, and a resection in 12/2020. Pt. has received chemotherapy, and radiation. Pt. reports sustaining a fracture of the thoracic vertebrae in 2017. Pt. endorses a history of nmultiple falls, reporting approximately 20 in the last 6 months with his last fall being 2 days ago. Pt. resides with his mother, and26 y.o. son who assist pt. with daily care needs. Pt. enjoys reading, wathcing YouTube, cooking, and spending time with his family  PRECAUTIONS: Fall  SUBJECTIVE:  Pt. Reports being scheduled for a CT scan of his abdomen this week.  PAIN:  Are you having pain? Yes: 8/10","Pain location: scarum,"Pain description: wound pain     OBJECTIVE:   TODAY'S TREATMENT:    Pt. and caregiver education was provided about positioning of the left UE, and proper ROM techniques for the LUE. Pt. tolerated slow prolonged gentle stretching of the bilateral pectoral musculature.  Pt. performed A/AAAROM,  and PROM for scapular elevation, depression, abduction following moist heat modality to the shoulder, and scapula, LUE, wrist, digit, and thumb radial, and palmar abduction. Pt.'s caregiver was able to demonstrate proper technique with verbal, and tactile cues for pectoral, and LUE stretches secondary to excessive forward head/neck flexion when sitting. Pt. presented with less lateral leaning today.Pt. tolerated soft tissue massage to the left scapular musculature secondary to tightness in preparation for ROM. Manual therapy was performed  independent of there. Ex. ROM. Pt. Worked on grasping 1" resistive cubes using a 3pt grasp pattern. Pt. then worked on pressing them back into place with isolated 2nd digit extension. Pt. Continues to work on improving LUE functioning in order to work towards increasing engagement during [employer], and IADL tasks.       Left: Grip strength: 15# Lateral pinch: 7#             3pt. Pinch: 4#      PATIENT EDUCATION: Education details: LUE functioning Person educated: Patient and Caregiver Education method: Explanation, Demonstration, Tactile cues, and Verbal cues Education comprehension: verbalized understanding, returned demonstration, verbal cues required, tactile cues required, and needs further education   HOME EXERCISE PROGRAM Continue with ongoing HEP with updating as needed.    OT Short Term Goals - 03/31/22 1942       OT SHORT TERM GOAL #1   Title Pt. will improve FOTO to reflect pt. perceived progress with assessment specific ADLs, and IADL tasks.    Baseline Eval: TBD    Time 12    Period Weeks    Status New    Target Date 05/12/22              OT Long Term Goals - 03/31/22 1944       OT LONG TERM GOAL #1   Title Pt. will require minA UE dressing skills    Baseline Eval: MaxA    Time 12    Period Weeks    Status New    Target Date 06/23/22      OT LONG TERM GOAL #2   Title Pt. will require modA LE dressing skills    Baseline Eval: MaxA    Time 12    Period Weeks    Status New    Target Date 06/23/22      OT LONG TERM GOAL #3   Title Pt. will demonstrate transfers to the [place] with minA    Baseline Eval: [place], when fatigued MaxA    Time 12    Period Weeks    Status New    Target Date 06/23/22      OT LONG TERM GOAL #4   Title Pt. will be independent with a HEP for the LUE    Baseline Eval: No current HEPs    Time 12    Period Weeks    Status New    Target Date 06/23/22      OT LONG TERM GOAL #5   Title Pt. will demonstrate visual compensatory strategies 100% of the time during ADLs, and IADLs    Baseline Eval: Currently does not utilize    Time 12    Period Weeks    Status New    Target Date 06/23/22      Long Term Additional Goals   Additional Long Term Goals Yes      OT LONG TERM GOAL #6   Title Pt. will improve left grip strength by 5#   in order to be able to hold, and stabilize items with ADLS.    Baseline Eval: eft grip strength: 17#    Time 12    Period Weeks    Status New    Target Date 06/23/22      OT LONG TERM GOAL #7   Title pt. will improve left lateral pinch strength by 3# to be able to hold, and stabilize a toothbush.    Baseline Eval: Left pinch strength 6#  Time 12    Period Weeks    Status New    Target Date 06/23/22              Plan - 05/10/22 1823     Clinical Impression Statement Pt. reports 8/10 pain in his back. Pt. and caregiver education was provided about positioning of the left UE, and proper ROM tecPhniques, and [facility] skills for the LUE. Pt. tolerate slow prolonged gentle stretching of the bilateral pectoral musculature, moist heat, and manual soft tissue massage. Pt. performed A/AAAROM, and PROM to the LUE, wrist, digit, and thumb radial, and palmar abduction. Pt. requires forearm support when grasping resistive cubes, and isolating then 2nd digit when pressing then back in place. Pt.'s caregiver was able to demonstrate proper technique with verbal, and tactile cues for pectoral, and LUE stretches secondary to excessive forward head/neck flexion when sitting. Pt. Continues to work on improving LUE functioning in order to work towards increasing engagement during [employer], and IADL tasks.    .     OT Occupational Profile and History Comprehensive Assessment- Review of records and extensive additional review of physical, cognitive, psychosocial history related to current functional performance    Occupational performance deficits (Please refer to evaluation for details): IADL's;ADL's;Social Participation    Body Structure / Function / Physical Skills ADL;FMC;Endurance;Strength;ROM    Rehab Potential Good    Clinical Decision Making Multiple treatment options, significant modification of task necessary    Comorbidities Affecting Occupational Performance: Presence of comorbidities impacting  occupational performance    Modification or Assistance to Complete Evaluation  Max significant modification of tasks or assist is necessary to complete    OT Frequency 2x / week    OT Duration 12 weeks    OT Treatment/Interventions Self-care/ADL training;Therapeutic exercise;Energy conservation;Neuromuscular education;DME and/or AE instruction;Moist Heat;Therapeutic activities;Patient/family Educational psychologist;Contrast Bath;Passive range of motion;Cognitive remediation/compensation;Manual Therapy;Coping strategies training    Consulted and Agree with Plan of Care Family member/caregiver    Family Member Consulted Mother            Harrel Carina, MS, OTR/L   Harrel Carina, [place] 05/31/2022, 4:00 PM

## 2022-06-01 ENCOUNTER — Ambulatory Visit
Admission: RE | Admit: 2022-06-01 | Discharge: 2022-06-01 | Disposition: A | Discharge status: Home / Self Care | Payer: Medicare (Managed Care) | Source: Ambulatory Visit | Attending: Nurse Practitioner | Admitting: Nurse Practitioner

## 2022-06-01 DIAGNOSIS — K7689 Other specified diseases of liver: Secondary | ICD-10-CM | POA: Diagnosis not present

## 2022-06-01 DIAGNOSIS — N281 Cyst of kidney, acquired: Secondary | ICD-10-CM | POA: Diagnosis not present

## 2022-06-01 MED ORDER — GADOBUTROL 1 MMOL/ML IV SOLN
5.0000 mL | Freq: Once | INTRAVENOUS | Status: AC | PRN
Start: 2022-06-01 — End: 2022-06-01
  Administered 2022-06-01: 5 mL via INTRAVENOUS

## 2022-06-01 NOTE — Progress Notes (Signed)
Please alert Cory Hayes that his MRI has returned, this shows no concerning liver masses, but does show multiple simple liver cysts + a few kidney cysts. Recommend to continue to follow with oncology and we will collaborate with them + monitor labs with them.  Overall reassuring follow-up imaging.  Any questions?  Have a wonderful day!!

## 2022-06-02 ENCOUNTER — Encounter: Payer: Self-pay | Admitting: Occupational Therapy

## 2022-06-02 ENCOUNTER — Ambulatory Visit: Payer: Medicare (Managed Care) | Admitting: Occupational Therapy

## 2022-06-02 ENCOUNTER — Ambulatory Visit: Payer: Medicare (Managed Care) | Admitting: Physical Therapy

## 2022-06-02 ENCOUNTER — Ambulatory Visit: Payer: Medicare (Managed Care) | Admitting: Speech Pathology

## 2022-06-02 DIAGNOSIS — R471 Dysarthria and anarthria: Secondary | ICD-10-CM

## 2022-06-02 DIAGNOSIS — R41841 Cognitive communication deficit: Secondary | ICD-10-CM | POA: Diagnosis not present

## 2022-06-02 DIAGNOSIS — M6281 Muscle weakness (generalized): Secondary | ICD-10-CM

## 2022-06-02 NOTE — Therapy (Addendum)
OUTPATIENT OCCUPATIONAL THERAPY TREATMENT NOTE   Patient Name: Cory Hayes MRN: [id number] DOB:1976-06-12, 46 y.o., male Today's Date: 06/02/2022  PCP: Charlynne Cousins, MD REFERRING PROVIDER: Charolette Forward, Jenny Reichmann, NP   OT End of Session - 06/02/22 1747     Visit Number 8    Number of Visits 24    Date for OT Re-Evaluation 06/23/22    Authorization Time Period progress reporting period starting 03/31/2022    OT Start Time 1520    OT Stop Time 1600    OT Time Calculation (min) 40 min    Activity Tolerance Patient tolerated treatment well    Behavior During Therapy [facility] for tasks assessed/performed             Past Medical History:  Diagnosis Date   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[place]) 2008   Seizures [facility])    Past Surgical History:  Procedure Laterality Date   BACK SURGERY  2017   CRANIOTOMY  2008, 2015, 12/21/20   Patient Active Problem List   Diagnosis Date Noted   Cachexia ([place]) 05/24/2022   Aortic atherosclerosis ([place]) 05/15/2022   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   Cellulitis and abscess of trunk 04/05/2022   Fall 12/22/2021   Pilonidal cyst 12/13/2021   Depression 12/01/2021   Diarrhea 11/18/2021   Nausea 11/18/2021   Unspecified convulsions ([place]) 09/29/2021   Chronic pain syndrome 03/15/2021   Pharmacologic therapy 03/15/2021   Disorder of skeletal system 03/15/2021   Problems influencing health status 03/15/2021   Long term prescription benzodiazepine use ([place]) 03/15/2021   History of thoracic spinal fusion (T3 thru T7) 03/15/2021   Anxiety 03/14/2021   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[place]) 01/21/2021   Epilepsy ([place]) 01/21/2021   Hyponatremia 12/22/2020   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 is a 46 y.o. 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 Pt. underwent  a craniotomy in 2015, and a resection in 12/2020. Pt. has received chemotherapy, and radiation. Pt. reports sustaining a fracture of the thoracic vertebrae in 2017. Pt. endorses a history of nmultiple falls, reporting approximately 20 in the last 6 months with his last fall being 2 days ago. Pt. resides with his mother, and82 y.o. son who assist pt. with daily care needs. Pt. enjoys reading, wathcing YouTube, cooking, and spending time with his family  PRECAUTIONS: Fall  SUBJECTIVE:  Pt. Reports feeling better today.   PAIN:  Are you having pain? Yes: 8/10","Pain location: scarum,"Pain description: wound pain     OBJECTIVE:   TODAY'S TREATMENT:    Pt. and caregiver education was provided about positioning of the left UE, and proper ROM techniques for the LUE. Pt. tolerated slow prolonged gentle stretching of the bilateral pectoral musculature.  Pt. performed A/AAAROM,  and PROM for scapular elevation, depression, abduction following moist heat modality to the shoulder, and scapula, LUE, wrist, digit, and thumb radial, and palmar abduction. Pt. worked on left biceps strengthening with yellow theraband for 1 set 10 reps, and triceps for 1 set 20 reps.  Pt. Worked on wrist extension with a 1# weight for 1 set 10 reps each. Pt. Worked on reaching with the LUE using the [employer] moving the rings through 4 horiizontal rungs of progressively increasing height. Pt. Required support at the left  elbow, and wrist when reaching.     Left: Grip strength: 15# Lateral pinch: 7#             3pt. Pinch: 4#      PATIENT EDUCATION: Education details: LUE functioning, strengthening exercises Person educated: Patient and Caregiver Education method: Explanation, Demonstration, Tactile cues, and Verbal cues Education comprehension: verbalized understanding, returned demonstration, verbal  cues required, tactile cues required, and needs further education   HOME EXERCISE PROGRAM Bicep/tricep strengthening with yellow theraband, wrist extension with1#  hand weight.   OT Short Term Goals - 03/31/22 1942       OT SHORT TERM GOAL #1   Title Pt. will improve FOTO to reflect pt. perceived progress with assessment specific ADLs, and IADL tasks.    Baseline Eval: TBD    Time 12    Period Weeks    Status New    Target Date 05/12/22              OT Long Term Goals - 03/31/22 1944       OT LONG TERM GOAL #1   Title Pt. will require minA UE dressing skills    Baseline Eval: MaxA    Time 12    Period Weeks    Status New    Target Date 06/23/22      OT LONG TERM GOAL #2   Title Pt. will require modA LE dressing skills    Baseline Eval: MaxA    Time 12    Period Weeks    Status New    Target Date 06/23/22      OT LONG TERM GOAL #3   Title Pt. will demonstrate transfers to the [place] with minA    Baseline Eval: [place], when fatigued MaxA    Time 12    Period Weeks    Status New    Target Date 06/23/22      OT LONG TERM GOAL #4   Title Pt. will be independent with a HEP for the LUE    Baseline Eval: No current HEPs    Time 12    Period Weeks    Status New    Target Date 06/23/22      OT LONG TERM GOAL #5   Title Pt. will demonstrate visual compensatory strategies 100% of the time during ADLs, and IADLs    Baseline Eval: Currently does not utilize    Time 12    Period Weeks    Status New    Target Date 06/23/22      Long Term Additional Goals   Additional Long Term Goals Yes      OT LONG TERM GOAL #6   Title Pt. will improve left grip strength by 5#  in order to be able to hold, and stabilize items with ADLS.    Baseline Eval: eft grip strength: 17#    Time 12    Period Weeks    Status New    Target Date 06/23/22      OT LONG TERM GOAL #7   Title pt. will improve left lateral pinch strength by 3# to be able to hold, and stabilize a toothbush.     Baseline Eval: Left pinch strength 6#    Time 12    Period Weeks    Status New    Target Date 06/23/22              Plan - 05/10/22 1823     Clinical Impression Statement Pt.  Continues to present with back pain. Pt. Was able to maintain his head, and neck in midline for longer reps today during the session. Pt. Tolerated the exercises well, with cues for proper technique, and hand posiiton. Pt. Was able to tolerate reaching to  increased heights. Pt. Continues to work on improving LUE ROM, strength, and [place] skills in order to work to improving LUE functioning,a nd maximizing independence with ADLs, and IADLs.   .     OT Occupational Profile and History Comprehensive Assessment- Review of records and extensive additional review of physical, cognitive, psychosocial history related to current functional performance    Occupational performance deficits (Please refer to evaluation for details): IADL's;ADL's;Social Participation    Body Structure / Function / Physical Skills ADL;FMC;Endurance;Strength;ROM    Rehab Potential Good    Clinical Decision Making Multiple treatment options, significant modification of task necessary    Comorbidities Affecting Occupational Performance: Presence of comorbidities impacting occupational performance    Modification or Assistance to Complete Evaluation  Max significant modification of tasks or assist is necessary to complete    OT Frequency 2x / week    OT Duration 12 weeks    OT Treatment/Interventions Self-care/ADL training;Therapeutic exercise;Energy conservation;Neuromuscular education;DME and/or AE instruction;Moist Heat;Therapeutic activities;Patient/family Educational psychologist;Contrast Bath;Passive range of motion;Cognitive remediation/compensation;Manual Therapy;Coping strategies training    Consulted and Agree with Plan of Care Family member/caregiver    Family Member Consulted Mother            Harrel Carina, MS, OTR/L    Harrel Carina, [place] 06/02/2022, 5:49 PM

## 2022-06-02 NOTE — Therapy (Signed)
OUTPATIENT SPEECH LANGUAGE PATHOLOGY TREATMENT NOTE   Patient Name: Cory Hayes MRN: [id number] DOB:1976-01-17, 46 y.o., male Today's Date: 06/02/2022   REFERRING PROVIDER: Alpha Gula, NP   End of Session - 06/02/22 1604     Visit Number 5    Number of Visits 13    Date for SLP Re-Evaluation 07/04/22    SLP Start Time 1406    SLP Stop Time  1500    SLP Time Calculation (min) 54 min    Activity Tolerance Patient tolerated treatment well             Past Medical History:  Diagnosis Date   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[place]) 2008   Seizures [facility])    Past Surgical History:  Procedure Laterality Date   BACK SURGERY  2017   CRANIOTOMY  2008, 2015, 12/21/20   Patient Active Problem List   Diagnosis Date Noted   Cachexia ([place]) 05/24/2022   Aortic atherosclerosis ([place]) 05/15/2022   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   Cellulitis and abscess of trunk 04/05/2022   Fall 12/22/2021   Pilonidal cyst 12/13/2021   Depression 12/01/2021   Diarrhea 11/18/2021   Nausea 11/18/2021   Unspecified convulsions ([place]) 09/29/2021   Chronic pain syndrome 03/15/2021   Pharmacologic therapy 03/15/2021   Disorder of skeletal system 03/15/2021   Problems influencing health status 03/15/2021   Long term prescription benzodiazepine use ([place]) 03/15/2021   History of thoracic spinal fusion (T3 thru T7) 03/15/2021   Anxiety 03/14/2021   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[place]) 01/21/2021   Epilepsy ([place]) 01/21/2021   Hyponatremia 12/22/2020   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 Underwent craniotomy in 2015; also s/p radiation, chemotherapy, and was admitted at [facility] for right parietal crani and tumor  resection on 12/21/20. Post-operatively was evaluated for dysphagia by SLP, FEES on 12/24/20 with recommendation for NDD3/nectar liquids. Pt advanced to regular/thin by time of d/c from rehab.    THERAPY DIAG:  Cognitive communication deficit  Dysarthria and anarthria  Rationale for Evaluation and Treatment Rehabilitation  SUBJECTIVE: "What are we doing today?" Pt accompanied by:  mother PAIN:  Are you having pain? No     OBJECTIVE:   TODAY'S TREATMENT: Self care/home mgmt: Pt and Pt's mother reported looking at daily schedule created with SLP last session, however have not been adding novel events or writing the date. SLP reinforced purpose of using compensations and acknowledged barriers of implementing use of external aid implementation for memory and recall; encouraged pt's mother to work schedule use into daily routine (for pt orientation/recall vs following a strict schedule). Will evaluate effectiveness/carryover and revise goals as needed. Speech tx: Targeted vocal intensity for improved intelligibility with loud "ah" focusing on adequate breath support; 6x average 15.23 sec and 78 dB with visual aid (mirror) and frequent models. Prompted vocalizing HEP personally relevant phrases with increased breath support/intensity 30x initially without models average 69 dB, with models 75 dB with mod-max cues/reminders for posture and deeper breaths/pausing. Prompted Pt to repeat phrases 30% of the time. Facilitated additional 10-15x functional phrases with focus on intensity and breath support, average 73 dB with mod cues. Provided education on the role breath support has with increasing intensity for intelligibility versus increasing intensity without adequate air causing  strain.  PATIENT EDUCATION: Education details: breath support and pacing Person educated: Patient and Parent Education method: Explanation, Demonstration, and Verbal cues Education comprehension: verbalized understanding and  needs further education   SLP Short Term Goals - 05/26/22 1709       SLP SHORT TERM GOAL #1   Title Pt will completed standardized cognitive assessment and assessment of motor speech.    Time 2    Period Weeks    Status New      SLP SHORT TERM GOAL #2   Title Pt will demonstrate HEP for motor speech >90% accuracy with min cues.    Time 6    Period Weeks    Status New      SLP SHORT TERM GOAL #3   Title Pt will initiate use of memory support system (external aid) and bring to 75% of therapy sessions.    Time 6    Period Weeks    Status New      SLP SHORT TERM GOAL #4   Title Patient will use intelligibility and fluency compensations in 5 minutes simple-mod complex conversation with rare min cues.    Time 6    Period Weeks    Status New              SLP Long Term Goals - 05/26/22 1709       SLP LONG TERM GOAL #1   Title Patient will report carryover of at least 3 memory strategies outside of ST.    Time 12    Period Weeks    Status New      SLP LONG TERM GOAL #2   Title Patient will increase vocal intensity in conversation to average >72 dB at 50 cm.    Baseline To be collected    Time 12    Period Weeks    Status New      SLP LONG TERM GOAL #3   Title Pt will use memory support system (external aid) to recall completed and upcoming appointments and birthdays >90% accuracy independently.    Time 12    Period Weeks    Status New      SLP LONG TERM GOAL #4   Title Pt will use intelligibility and fluency compensations effectively >90% of the time in 15 minutes mod complex- complex conversation.    Time 12    Period Weeks    Status New              Plan - 05/26/22 1708     Clinical Impression Statement Patient presents with moderate cognitive communication impairments, stuttering and mild dysarthria; these appear to be chronic impairments exacerbated by most recent craniotomy and tumor resection in February 2022. Pt demonstrates low vocal intensity  with reduced breath support in conversation and demonstrates improved intensity in HEP. Decreased volume continues to greatly impact intelligibility and causes communication breakdowns with family. Encouraged Pt's mother to help Pt identity opportunities for communication repairs by increasing volume and breath support. Plan to continue targeting cognitive strategies for memory and recall. Recommend ST to train Pt in compensatory cognitive strategies for memory, attention, and recall as well as exercises to improve vocal strength and breath support as well to improve communication with family.   Speech Therapy Frequency 1x /week    Duration 12 weeks    Treatment/Interventions Cognitive reorganization;Compensatory strategies;Multimodal communcation approach;Internal/external aids;Functional tasks;SLP instruction and feedback;Patient/family education    Potential to Achieve Goals Good    Potential Considerations Other (comment)  time post onset   Consulted and Agree with Plan of Care Patient;Family member/caregiver               Glennie Hawk, [place] 06/02/2022, 4:05 PM

## 2022-06-06 ENCOUNTER — Ambulatory Visit: Payer: Medicare (Managed Care) | Admitting: Licensed Clinical Social Worker

## 2022-06-07 ENCOUNTER — Encounter: Payer: Self-pay | Admitting: Occupational Therapy

## 2022-06-07 ENCOUNTER — Ambulatory Visit: Payer: Medicare (Managed Care) | Admitting: Occupational Therapy

## 2022-06-07 ENCOUNTER — Ambulatory Visit: Payer: Medicare (Managed Care) | Admitting: Speech Pathology

## 2022-06-07 ENCOUNTER — Ambulatory Visit: Payer: Medicare (Managed Care) | Admitting: Physical Therapy

## 2022-06-07 DIAGNOSIS — R41841 Cognitive communication deficit: Secondary | ICD-10-CM

## 2022-06-07 DIAGNOSIS — M6281 Muscle weakness (generalized): Secondary | ICD-10-CM

## 2022-06-07 DIAGNOSIS — R471 Dysarthria and anarthria: Secondary | ICD-10-CM

## 2022-06-07 NOTE — Therapy (Signed)
OUTPATIENT OCCUPATIONAL THERAPY TREATMENT NOTE   Patient Name: Cory Hayes MRN: [id number] DOB:10/26/1976, 46 y.o., male Today's Date: 06/07/2022  PCP: Charlynne Cousins, MD REFERRING PROVIDER: Charolette Forward, Jenny Reichmann, NP   OT End of Session - 06/07/22 1525     Visit Number 9    Number of Visits 24    Date for OT Re-Evaluation 06/23/22    Authorization Time Period progress reporting period starting 03/31/2022    OT Start Time 1350    OT Stop Time 1430    OT Time Calculation (min) 40 min    Activity Tolerance Patient tolerated treatment well    Behavior During Therapy [facility] for tasks assessed/performed             Past Medical History:  Diagnosis Date   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[place]) 2008   Seizures [facility])    Past Surgical History:  Procedure Laterality Date   BACK SURGERY  2017   CRANIOTOMY  2008, 2015, 12/21/20   Patient Active Problem List   Diagnosis Date Noted   Cachexia ([place]) 05/24/2022   Aortic atherosclerosis ([place]) 05/15/2022   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   Cellulitis and abscess of trunk 04/05/2022   Fall 12/22/2021   Pilonidal cyst 12/13/2021   Depression 12/01/2021   Diarrhea 11/18/2021   Nausea 11/18/2021   Unspecified convulsions ([place]) 09/29/2021   Chronic pain syndrome 03/15/2021   Pharmacologic therapy 03/15/2021   Disorder of skeletal system 03/15/2021   Problems influencing health status 03/15/2021   Long term prescription benzodiazepine use (Clobazam) 03/15/2021   History of thoracic spinal fusion (T3 thru T7) 03/15/2021   Anxiety 03/14/2021   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[place]) 01/21/2021   Epilepsy ([place]) 01/21/2021   Hyponatremia 12/22/2020   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 is a 46 y.o. 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 Pt. underwent  a craniotomy in 2015, and a resection in 12/2020. Pt. has received chemotherapy, and radiation. Pt. reports sustaining a fracture of the thoracic vertebrae in 2017. Pt. endorses a history of nmultiple falls, reporting approximately 20 in the last 6 months with his last fall being 2 days ago. Pt. resides with his mother, and37 y.o. son who assist pt. with daily care needs. Pt. enjoys reading, wathcing YouTube, cooking, and spending time with his family  PRECAUTIONS: Fall  SUBJECTIVE:  Pt. Reports feeling better today.   PAIN:  Are you having pain? Yes: 8/10","Pain location: Back      OBJECTIVE:   TODAY'S TREATMENT:    Pt. and caregiver education was provided about positioning of the left UE, and proper ROM techniques for the LUE. Pt. tolerated slow prolonged gentle stretching of the bilateral pectoral musculature.  Pt. performed A/AAAROM,  and PROM for scapular elevation, depression, abduction following moist heat modality to the shoulder.  Pt. Performed active resistive exercises for left elbow flexion, extension, forearm supination, wrist extension, digit extension at the tabletop.  Pt. Worked on [facility] skills grasping 1" sticks with the left from a shallow dish, and threading them through a small collar stabilized with the right hand to simulate threading beads in preparation for jewelry making tasks.   Pt. Continues to present with  back pain. Pt. Continues to be able to maintain his head, and  neck in midline for longer reps today during the session. Pt. tolerated the exercises well, with cues for proper technique, and hand posiiton.Pt. Was able to hold with resistance for increased reps. Pt. Was able to initiate [facility] skills with support and the left wrist, and elbow. Pt. Continues to work on improving LUE ROM, strength, and [place] skills in order to work to improve LUE functioning, and  maximizing independence with ADLs, and IADLs.   .  Left: Grip strength: 15# Lateral pinch: 7#             3pt. Pinch: 4#      PATIENT EDUCATION: Education details: LUE functioning, strengthening exercises Person educated: Patient and Caregiver Education method: Explanation, Demonstration, Tactile cues, and Verbal cues Education comprehension: verbalized understanding, returned demonstration, verbal cues required, tactile cues required, and needs further education   HOME EXERCISE PROGRAM Bicep/tricep strengthening with yellow theraband, wrist extension with1#  hand weight.   OT Short Term Goals - 03/31/22 1942       OT SHORT TERM GOAL #1   Title Pt. will improve FOTO to reflect pt. perceived progress with assessment specific ADLs, and IADL tasks.    Baseline Eval: TBD    Time 12    Period Weeks    Status New    Target Date 05/12/22              OT Long Term Goals - 03/31/22 1944       OT LONG TERM GOAL #1   Title Pt. will require minA UE dressing skills    Baseline Eval: MaxA    Time 12    Period Weeks    Status New    Target Date 06/23/22      OT LONG TERM GOAL #2   Title Pt. will require modA LE dressing skills    Baseline Eval: MaxA    Time 12    Period Weeks    Status New    Target Date 06/23/22      OT LONG TERM GOAL #3   Title Pt. will demonstrate transfers to the [place] with minA    Baseline Eval: [place], when fatigued MaxA    Time 12    Period Weeks    Status New    Target Date 06/23/22      OT LONG TERM GOAL #4   Title Pt. will be independent with a HEP for the LUE    Baseline Eval: No current HEPs    Time 12    Period Weeks    Status New    Target Date 06/23/22      OT LONG TERM GOAL #5   Title Pt. will demonstrate visual compensatory strategies 100% of the time during ADLs, and IADLs    Baseline Eval: Currently does not utilize    Time 12    Period Weeks    Status New    Target Date 06/23/22      Long Term Additional Goals    Additional Long Term Goals Yes      OT LONG TERM GOAL #6   Title Pt. will improve left grip strength by 5#  in order to be able to hold, and stabilize items with ADLS.    Baseline Eval: eft grip strength: 17#    Time 12    Period Weeks    Status New    Target Date 06/23/22      OT LONG TERM GOAL #7   Title pt. will improve left  lateral pinch strength by 3# to be able to hold, and stabilize a toothbush.    Baseline Eval: Left pinch strength 6#    Time 12    Period Weeks    Status New    Target Date 06/23/22              Plan - 05/10/22 1823     Clinical Impression Statement Pt. Continues to present with  back pain. Pt. Continues to be able to maintain his head, and neck in midline for longer reps today during the session. Pt. tolerated the exercises well, with cues for proper technique, and hand posiiton.Pt. Was able to hold with resistance for increased reps. Pt. Was able to initiate [facility] skills with support and the left wrist, and elbow. Pt. Continues to work on improving LUE ROM, strength, and [place] skills in order to work to improve LUE functioning, and maximizing independence with ADLs, and IADLs.   .     OT Occupational Profile and History Comprehensive Assessment- Review of records and extensive additional review of physical, cognitive, psychosocial history related to current functional performance    Occupational performance deficits (Please refer to evaluation for details): IADL's;ADL's;Social Participation    Body Structure / Function / Physical Skills ADL;FMC;Endurance;Strength;ROM    Rehab Potential Good    Clinical Decision Making Multiple treatment options, significant modification of task necessary    Comorbidities Affecting Occupational Performance: Presence of comorbidities impacting occupational performance    Modification or Assistance to Complete Evaluation  Max significant modification of tasks or assist is necessary to complete    OT Frequency 2x / week    OT  Duration 12 weeks    OT Treatment/Interventions Self-care/ADL training;Therapeutic exercise;Energy conservation;Neuromuscular education;DME and/or AE instruction;Moist Heat;Therapeutic activities;Patient/family Educational psychologist;Contrast Bath;Passive range of motion;Cognitive remediation/compensation;Manual Therapy;Coping strategies training    Consulted and Agree with Plan of Care Family member/caregiver    Family Member Consulted Mother            Harrel Carina, MS, OTR/L   Harrel Carina, OT 06/07/2022, 3:28 PM

## 2022-06-07 NOTE — Therapy (Unsigned)
OUTPATIENT SPEECH LANGUAGE PATHOLOGY TREATMENT NOTE   Patient Name: Cory Hayes MRN: [id number] DOB:[birth year], 46 y.o., male Today's Date: 06/07/2022   REFERRING PROVIDER: Alpha Gula, NP   End of Session - 06/07/22 1307     Visit Number 6    Number of Visits 13    Date for SLP Re-Evaluation 07/04/22    SLP Start Time 1300    SLP Stop Time  1345    SLP Time Calculation (min) 45 min    Activity Tolerance Patient tolerated treatment well             Past Medical History:  Diagnosis Date   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[place]) 2008   Seizures [facility])    Past Surgical History:  Procedure Laterality Date   BACK SURGERY  2017   CRANIOTOMY  2008, 2015, 12/21/20   Patient Active Problem List   Diagnosis Date Noted   Cachexia ([place]) 05/24/2022   Aortic atherosclerosis ([place]) 05/15/2022   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   Cellulitis and abscess of trunk 04/05/2022   Fall 12/22/2021   Pilonidal cyst 12/13/2021   Depression 12/01/2021   Diarrhea 11/18/2021   Nausea 11/18/2021   Unspecified convulsions ([place]) 09/29/2021   Chronic pain syndrome 03/15/2021   Pharmacologic therapy 03/15/2021   Disorder of skeletal system 03/15/2021   Problems influencing health status 03/15/2021   Long term prescription benzodiazepine use (Clobazam) 03/15/2021   History of thoracic spinal fusion (T3 thru T7) 03/15/2021   Anxiety 03/14/2021   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[place]) 01/21/2021   Epilepsy ([place]) 01/21/2021   Hyponatremia 12/22/2020   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 Underwent craniotomy in 2015; also s/p radiation, chemotherapy, and was admitted at [facility] for right parietal crani and tumor  resection on 12/21/20. Post-operatively was evaluated for dysphagia by SLP, FEES on 12/24/20 with recommendation for NDD3/nectar liquids. Pt advanced to regular/thin by time of d/c from rehab.    THERAPY DIAG:  Cognitive communication deficit  Dysarthria and anarthria  Rationale for Evaluation and Treatment Rehabilitation  SUBJECTIVE: Pt reports recently using a planner to schedule daily routines Pt accompanied by:  mother PAIN:  Are you having pain? No     OBJECTIVE:   TODAY'S TREATMENT: Pt reports getting a planner and using it to recall daily tasks and goals (making phone calls, moving positions/standing). SLP encouraged Pt to bring planner next session to review accessibility and modify external aid as needed. SLP prompted Pt to identify ways planner could aid memory and planning; 1x item (budgeting). Targeted vocal intensity for improved intelligibility with loud "ah" focusing on adequate breath support; >10 reps average 61 dB (max 80 dB) with mod-max cues (visual aids and frequent models). Despite visual and auditory cues (recording) to increase awareness of fading intensity, strain, timing of breath support; patient was able to imitate or adjust production <50% of the time. SLP encouraged focusing on shorter "ah" with consistent intensity versus fading longer "ah". SLP modeled 50 short phrases 3-4x with max to mod cues. Required frequent modification of instructions for abdominal breathing and increasing/maintaining intensity without vocal strain [employer]" as a model). Pt straightened posture to aid vocal exercises 8x with modified independence. Provided education on using adequate breath support during HEP stopping vocal exercises  when feeling vocal strain.  PATIENT EDUCATION: Education details: breath support and vocal strain Person educated: Patient and Parent Education method: Explanation, Media planner, and Verbal cues Education comprehension: verbalized understanding and needs  further education   SLP Short Term Goals - 05/26/22 1709       SLP SHORT TERM GOAL #1   Title Pt will completed standardized cognitive assessment and assessment of motor speech.    Time 2    Period Weeks    Status New      SLP SHORT TERM GOAL #2   Title Pt will demonstrate HEP for motor speech >90% accuracy with min cues.    Time 6    Period Weeks    Status New      SLP SHORT TERM GOAL #3   Title Pt will initiate use of memory support system (external aid) and bring to 75% of therapy sessions.    Time 6    Period Weeks    Status New      SLP SHORT TERM GOAL #4   Title Patient will use intelligibility and fluency compensations in 5 minutes simple-mod complex conversation with rare min cues.    Time 6    Period Weeks    Status New              SLP Long Term Goals - 05/26/22 1709       SLP LONG TERM GOAL #1   Title Patient will report carryover of at least 3 memory strategies outside of ST.    Time 12    Period Weeks    Status New      SLP LONG TERM GOAL #2   Title Patient will increase vocal intensity in conversation to average >72 dB at 50 cm.    Baseline To be collected    Time 12    Period Weeks    Status New      SLP LONG TERM GOAL #3   Title Pt will use memory support system (external aid) to recall completed and upcoming appointments and birthdays >90% accuracy independently.    Time 12    Period Weeks    Status New      SLP LONG TERM GOAL #4   Title Pt will use intelligibility and fluency compensations effectively >90% of the time in 15 minutes mod complex- complex conversation.    Time 12    Period Weeks    Status New              Plan - 05/26/22 1708     Clinical Impression Statement Patient presents with moderate cognitive communication impairments, stuttering and mild dysarthria; these appear to be chronic impairments exacerbated by most recent craniotomy and tumor resection in February 2022. Pt demonstrates low vocal intensity with  reduced breath support in conversation with improved intensity in isolated "ah", however noted to strain due to inconsistent breath support. Minimal carry over of increased intensity in conversation after vocal exercises, low volume continues to greatly impact intelligibility. Encouraged Pt's mother to help Pt identify opportunities for communication repairs by increasing volume and breath support, and noting moments of strain during HEP. Due to cognitive impairments, anticipate pt will require cues/family support on an ongoing basis for carryover/implementation of trained strategies. Recommend ST to train Pt and family (cuing) in compensatory cognitive strategies for memory, attention, and recall as well as exercises to improve vocal strength and breath support as well to improve communication with family.   Speech Therapy Frequency 1x /  week    Duration 12 weeks    Treatment/Interventions Cognitive reorganization;Compensatory strategies;Multimodal communcation approach;Internal/external aids;Functional tasks;SLP instruction and feedback;Patient/family education    Potential to Achieve Goals Good    Potential Considerations Other (comment)   time post onset   Consulted and Agree with Plan of Care Patient;Family member/caregiver               Glennie Hawk, [place] 06/07/2022, 2:53 PM

## 2022-06-09 ENCOUNTER — Encounter: Payer: Medicare (Managed Care) | Admitting: Speech Pathology

## 2022-06-09 ENCOUNTER — Ambulatory Visit: Payer: Medicare (Managed Care) | Admitting: Physical Therapy

## 2022-06-09 ENCOUNTER — Ambulatory Visit: Payer: Medicare (Managed Care) | Admitting: Occupational Therapy

## 2022-06-10 ENCOUNTER — Encounter: Payer: Self-pay | Admitting: Family

## 2022-06-14 ENCOUNTER — Ambulatory Visit: Payer: Medicare (Managed Care) | Admitting: Physical Therapy

## 2022-06-14 ENCOUNTER — Ambulatory Visit: Payer: Medicare (Managed Care) | Admitting: Occupational Therapy

## 2022-06-14 ENCOUNTER — Encounter: Payer: Medicare (Managed Care) | Admitting: Speech Pathology

## 2022-06-16 ENCOUNTER — Ambulatory Visit: Payer: Medicare (Managed Care) | Admitting: Occupational Therapy

## 2022-06-16 ENCOUNTER — Ambulatory Visit: Payer: Medicare (Managed Care) | Admitting: Physical Therapy

## 2022-06-16 ENCOUNTER — Ambulatory Visit: Payer: Medicare (Managed Care) | Admitting: Speech Pathology

## 2022-06-20 NOTE — Chronic Care Management (AMB) (Signed)
    Clinical Social Work  Care Management   Phone Outreach    06/20/2022 Name: Cory Hayes MRN: [id number] DOB: 10-13-1976  Cory Hayes is a 46 y.o. year old male who is a primary care patient of Practice, [place] .   Reason for referral: Mental Health Counseling and Resources.    F/U phone call today to assess needs, progress and barriers with care plan goals.   Pt's mother unable to keep phone appointment today and requested to reschedule.  Plan: LCSW will follow up with pt at a later date  Review of patient status, including review of consultants reports, relevant laboratory and other test results, and collaboration with appropriate care team members and the patient's provider was performed as part of comprehensive patient evaluation and provision of care management services.    Christa See, MSW, [email or web address] Phone [phone] 6:40 AM

## 2022-06-21 ENCOUNTER — Ambulatory Visit: Payer: Medicare (Managed Care) | Admitting: Occupational Therapy

## 2022-06-21 ENCOUNTER — Ambulatory Visit: Payer: Medicare (Managed Care) | Admitting: Physical Therapy

## 2022-06-21 ENCOUNTER — Ambulatory Visit: Payer: Medicare (Managed Care) | Attending: Nurse Practitioner | Admitting: Speech Pathology

## 2022-06-21 DIAGNOSIS — M6281 Muscle weakness (generalized): Secondary | ICD-10-CM | POA: Diagnosis not present

## 2022-06-21 DIAGNOSIS — R471 Dysarthria and anarthria: Secondary | ICD-10-CM | POA: Diagnosis not present

## 2022-06-21 DIAGNOSIS — R41841 Cognitive communication deficit: Secondary | ICD-10-CM | POA: Diagnosis not present

## 2022-06-21 NOTE — Patient Instructions (Signed)
Communication tips for wordfinding  Say one thing at a time Don't  rush - slow down, be patient Talk face to face Reduce background noise Relax - be natural Use pen and paper Write down key words Draw diagrams or pictures Don't pretend you understand Ask what helps Recap - check you both understand Be a partner, not a therapist   Aphasia does not affect intelligence, only language. The person with aphasia can still: make decisions, have opinions, and socialize.   Describing words  What group does it belong to?  What do I use it for?  Where can I find it?  What does it LOOK like?  What other words go with it?  What is the 1st sound of the word?   Many Ways to Communicate  Describe it Write it Draw it Gesture it Use related words  [place] with Dr. Leo Rod at [facility] - email [email or web address]  [facility] Therapy app by [place] on [employer] website National Aphasia Association - naa.aphasia.org Aphasia Recovery Connection - aphasiarecoveryconnection.org [employer]actus therapy apps [employer] Therapy

## 2022-06-21 NOTE — Therapy (Addendum)
Occupational Therapy Progress/Recertification Note  Dates of reporting period  03/31/2022   to   06/21/2022    Patient Name: Cory Hayes MRN: [id number] DOB:[birth year], 46 y.o., male Today's Date: 06/21/2022  PCP: Charlynne Cousins, MD REFERRING PROVIDER: Charolette Forward, Jenny Reichmann, NP   OT End of Session - 06/21/22 1418     Visit Number 10    Number of Visits 24    Date for OT Re-Evaluation 09/13/22    Authorization Time Period progress reporting period starting 03/31/2022    OT Start Time 1405    OT Stop Time 1430    OT Time Calculation (min) 25 min    Activity Tolerance Patient tolerated treatment well    Behavior During Therapy [facility] for tasks assessed/performed             Past Medical History:  Diagnosis Date   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[place]) 2008   Seizures [facility])    Past Surgical History:  Procedure Laterality Date   BACK SURGERY  2017   CRANIOTOMY  2008, 2015, 12/21/20   Patient Active Problem List   Diagnosis Date Noted   Cachexia ([place]) 05/24/2022   Aortic atherosclerosis ([place]) 05/15/2022   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   Cellulitis and abscess of trunk 04/05/2022   Fall 12/22/2021   Pilonidal cyst 12/13/2021   Depression 12/01/2021   Diarrhea 11/18/2021   Nausea 11/18/2021   Unspecified convulsions ([place]) 09/29/2021   Chronic pain syndrome 03/15/2021   Pharmacologic therapy 03/15/2021   Disorder of skeletal system 03/15/2021   Problems influencing health status 03/15/2021   Long term prescription benzodiazepine use (Clobazam) 03/15/2021   History of thoracic spinal fusion (T3 thru T7) 03/15/2021   Anxiety 03/14/2021   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[place]) 01/21/2021   Epilepsy ([place]) 01/21/2021   Hyponatremia 12/22/2020   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 is a 46 y.o.  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 Pt. underwent  a craniotomy in 2015, and a resection in 12/2020. Pt. has received chemotherapy, and radiation. Pt. reports sustaining a fracture of the thoracic vertebrae in 2017. Pt. endorses a history of nmultiple falls, reporting approximately 20 in the last 6 months with his last fall being 2 days ago. Pt. resides with his mother, and6 y.o. son who assist pt. with daily care needs. Pt. enjoys reading, wathcing YouTube, cooking, and spending time with his family  PRECAUTIONS: Fall  SUBJECTIVE:  Pt. Reports feeling better today.   PAIN:  Are you having pain? Yes: 8/10","Pain location: Back      OBJECTIVE:   TODAY'S TREATMENT:    Pt. reports that he has an orthopedic appointment soon to assess his back. Pt. was late for the OT session due to a scheduling conflict with ST this afternoon. Measurements were obtained, and goals were reviewed with the Pt. Pt. has improved with his ability to tolerate therapy. Pt. continues to present with weakness, back pain, limited activity tolerance, impaired LUE functioning, decreased grip strength, pinch strength, motor control, and [facility] skills which hinder his ability to perform ADL, and  IADL tasks independently, and safely.   Pt. continues to work on these skills in order to improve strength, activity tolerance and UE functioning in order to work towards improving, and maximizing independence  with overall ADLs, and IADL tasks.    Left: Grip strength: 15# Lateral pinch: 7#             3pt. Pinch: 4#  06/21/2022 Grip strength: 8# Lateral pinch: 7#             3pt. Pinch: 5#  PATIENT EDUCATION: Education details: LUE functioning, strengthening exercises Person educated: Patient and Caregiver Education method: Explanation, Demonstration, Tactile cues, and Verbal cues Education  comprehension: verbalized understanding, returned demonstration, verbal cues required, tactile cues required, and needs further education   HOME EXERCISE PROGRAM Bicep/tricep strengthening with yellow theraband, wrist extension with1#  hand weight.   OT Short Term Goals - 03/31/22 1942       OT SHORT TERM GOAL #1   Title Pt. will improve FOTO to reflect pt. perceived progress with assessment specific ADLs, and IADL tasks.    Baseline Eval: TBD    Time 12    Period Weeks    Status New    Target Date 05/12/22              OT Long Term Goals - 03/31/22 1944       OT LONG TERM GOAL #1   Title Pt. will require minA UE dressing skills    Baseline 06/21/2022: MaxA Eval: MaxA    Time 12    Period Weeks    Status Ongoing   Target Date 09/13/2022      OT LONG TERM GOAL #2   Title Pt. will require modA LE dressing skills    Baseline 06/21/2022: MaxA Eval: MaxA    Time 12    Period Weeks    Status Ongoing   Target Date 09/13/22      OT LONG TERM GOAL #3   Title Pt. will demonstrate transfers to the [place] with minA    Baseline 06/21/2022: ModA Eval: ModA, when fatigued MaxA    Time 12    Period Weeks    Status Ongoing   Target Date 09/13/2022     OT LONG TERM GOAL #4   Title Pt. will be independent with a HEP for the LUE    Baseline 06/21/2022: Pt.'s mother assists with HEP for the LUE. Eval: No current HEPs    Time 12    Period Weeks    Status Ongoing   Target Date 09/13/2022     OT LONG TERM GOAL #5   Title Pt. will demonstrate visual compensatory strategies 100% of the time during ADLs, and IADLs    Baseline Eval: Currently does not utilize    Time 12    Period Weeks    Status Ongoing   Target Date 06/23/22      Long Term Additional Goals   Additional Long Term Goals Yes      OT LONG TERM GOAL #6   Title Pt. will improve left grip strength by 5#  in order to be able to hold, and stabilize items with ADLS.    Baseline 06/21/2022: 8# Eval: Left grip strength:  17#    Time 12    Period Weeks    Status Ongoing   Target Date 09/13/22      OT LONG TERM GOAL #7   Title pt. will improve left lateral pinch strength by 3# to be able to hold, and stabilize a toothbush.    Baseline 06/21/2022: 7# Eval: Left pinch strength 6#    Time 12    Period Weeks    Status Ongoing  Target Date 09/13/2022       OT LONG TERM Goal #8  Title: Pt. Will improve LUE strength by 2 mm grades to assist with ADLs/IADLs Baseline:  06/21/2022: left shoulder flexion 3-/5, abduction 2+/5, elbow flexion extension 3/5, wrist extension 3-/5 Time: 12 Period:Weeks Target: 09/13/2022     Plan - 05/10/22 1823     Clinical Impression Statement Pt. reports that he has an orthopedic appointment soon to assess his back. Pt. was late for the OT session due to a scheduling conflict with ST this afternoon. Measurements were obtained, and goals were reviewed with the Pt. Pt. has improved with his ability to tolerate therapy. Pt. continues to present with weakness, back pain, limited activity tolerance, impaired LUE functioning, decreased grip strength, pinch strength, motor control, and [facility] skills which hinder his ability to perform ADL, and  IADL tasks independently, and safely.   Pt. continues to work on these skills in order to improve strength, activity tolerance and UE functioning in order to work towards improving, and maximizing independence with overall ADLs, and IADL tasks.      .     OT Occupational Profile and History Comprehensive Assessment- Review of records and extensive additional review of physical, cognitive, psychosocial history related to current functional performance    Occupational performance deficits (Please refer to evaluation for details): IADL's;ADL's;Social Participation    Body Structure / Function / Physical Skills ADL;FMC;Endurance;Strength;ROM    Rehab Potential Good    Clinical Decision Making Multiple treatment options, significant modification of task  necessary    Comorbidities Affecting Occupational Performance: Presence of comorbidities impacting occupational performance    Modification or Assistance to Complete Evaluation  Max significant modification of tasks or assist is necessary to complete    OT Frequency 2x / week    OT Duration 12 weeks    OT Treatment/Interventions Self-care/ADL training;Therapeutic exercise;Energy conservation;Neuromuscular education;DME and/or AE instruction;Moist Heat;Therapeutic activities;Patient/family Educational psychologist;Contrast Bath;Passive range of motion;Cognitive remediation/compensation;Manual Therapy;Coping strategies training    Consulted and Agree with Plan of Care Family member/caregiver    Family Member Consulted Mother            Harrel Carina, MS, OTR/L   Harrel Carina, OT 06/21/2022, 2:21 PM

## 2022-06-21 NOTE — Therapy (Unsigned)
OUTPATIENT SPEECH LANGUAGE PATHOLOGY TREATMENT NOTE   Patient Name: Cory Hayes MRN: [id number] DOB:Aug 06, 1976, 46 y.o., male Today's Date: 06/21/2022   REFERRING PROVIDER: Alpha Gula, NP   End of Session - 06/21/22 1319     Visit Number 7    Number of Visits 13    Date for SLP Re-Evaluation 07/04/22    SLP Start Time 3818    SLP Stop Time  1400    SLP Time Calculation (min) 55 min    Activity Tolerance Patient tolerated treatment well             Past Medical History:  Diagnosis Date   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[place]) 2008   Seizures [facility])    Past Surgical History:  Procedure Laterality Date   BACK SURGERY  2017   CRANIOTOMY  2008, 2015, 12/21/20   Patient Active Problem List   Diagnosis Date Noted   Cachexia ([place]) 05/24/2022   Aortic atherosclerosis ([place]) 05/15/2022   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   Cellulitis and abscess of trunk 04/05/2022   Fall 12/22/2021   Pilonidal cyst 12/13/2021   Depression 12/01/2021   Diarrhea 11/18/2021   Nausea 11/18/2021   Unspecified convulsions ([place]) 09/29/2021   Chronic pain syndrome 03/15/2021   Pharmacologic therapy 03/15/2021   Disorder of skeletal system 03/15/2021   Problems influencing health status 03/15/2021   Long term prescription benzodiazepine use (Clobazam) 03/15/2021   History of thoracic spinal fusion (T3 thru T7) 03/15/2021   Anxiety 03/14/2021   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[place]) 01/21/2021   Epilepsy ([place]) 01/21/2021   Hyponatremia 12/22/2020   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 Underwent craniotomy in 2015; also s/p radiation, chemotherapy, and was admitted at [facility] for right parietal crani and tumor  resection on 12/21/20. Post-operatively was evaluated for dysphagia by SLP, FEES on 12/24/20 with recommendation for NDD3/nectar liquids. Pt advanced to regular/thin by time of d/c from rehab.    THERAPY DIAG:  Dysarthria and anarthria  Cognitive communication deficit  Rationale for Evaluation and Treatment Rehabilitation  SUBJECTIVE: Pt reports recently using a planner to schedule daily routines Pt accompanied by:  mother PAIN:  Are you having pain? No     OBJECTIVE:   TODAY'S TREATMENT: Pt reports getting a planner and using it to recall daily tasks and goals (making phone calls, moving positions/standing). SLP encouraged Pt to bring planner next session to review accessibility and modify external aid as needed. SLP prompted Pt to identify ways planner could aid memory and planning; 1x item (budgeting). Targeted vocal intensity for improved intelligibility with loud "ah" focusing on adequate breath support; >10 reps average 61 dB (max 80 dB) with mod-max cues (visual aids and frequent models). Despite visual and auditory cues (recording) to increase awareness of fading intensity, strain, timing of breath support; patient was able to imitate or adjust production <50% of the time. SLP encouraged focusing on shorter "ah" with consistent intensity versus fading longer "ah". SLP modeled 50 short phrases 3-4x with max to mod cues. Required frequent modification of instructions for abdominal breathing and increasing/maintaining intensity without vocal strain [employer]" as a model). Pt straightened posture to aid vocal exercises 8x with modified independence. Provided education on using adequate breath support during HEP stopping vocal exercises  when feeling vocal strain.  PATIENT EDUCATION: Education details: breath support and vocal strain Person educated: Patient and Parent Education method: Explanation, Media planner, and Verbal cues Education comprehension: verbalized understanding and needs  further education   SLP Short Term Goals - 05/26/22 1709       SLP SHORT TERM GOAL #1   Title Pt will completed standardized cognitive assessment and assessment of motor speech.    Time 2    Period Weeks    Status New      SLP SHORT TERM GOAL #2   Title Pt will demonstrate HEP for motor speech >90% accuracy with min cues.    Time 6    Period Weeks    Status New      SLP SHORT TERM GOAL #3   Title Pt will initiate use of memory support system (external aid) and bring to 75% of therapy sessions.    Time 6    Period Weeks    Status New      SLP SHORT TERM GOAL #4   Title Patient will use intelligibility and fluency compensations in 5 minutes simple-mod complex conversation with rare min cues.    Time 6    Period Weeks    Status New              SLP Long Term Goals - 05/26/22 1709       SLP LONG TERM GOAL #1   Title Patient will report carryover of at least 3 memory strategies outside of ST.    Time 12    Period Weeks    Status New      SLP LONG TERM GOAL #2   Title Patient will increase vocal intensity in conversation to average >72 dB at 50 cm.    Baseline To be collected    Time 12    Period Weeks    Status New      SLP LONG TERM GOAL #3   Title Pt will use memory support system (external aid) to recall completed and upcoming appointments and birthdays >90% accuracy independently.    Time 12    Period Weeks    Status New      SLP LONG TERM GOAL #4   Title Pt will use intelligibility and fluency compensations effectively >90% of the time in 15 minutes mod complex- complex conversation.    Time 12    Period Weeks    Status New              Plan - 05/26/22 1708     Clinical Impression Statement Patient presents with moderate cognitive *** communication impairments, stuttering and mild dysarthria; these appear to be chronic impairments exacerbated by most recent craniotomy and tumor resection in February 2022. Pt demonstrates low vocal intensity with  reduced breath support in conversation with improved intensity in isolated "ah", however noted to strain due to inconsistent breath support. Minimal carry over of increased intensity in conversation after vocal exercises, low volume continues to greatly impact intelligibility. Encouraged Pt's mother to help Pt identify opportunities for communication repairs by increasing volume and breath support, and noting moments of strain during HEP. Due to cognitive impairments, anticipate pt will require cues/family support on an ongoing basis for carryover/implementation of trained strategies. Recommend ST to train Pt and family (cuing) in compensatory cognitive strategies for memory, attention, and recall as well as exercises to improve vocal strength and breath support as well to improve communication with family.   Speech Therapy Frequency  1x /week    Duration 12 weeks    Treatment/Interventions Cognitive reorganization;Compensatory strategies;Multimodal communcation approach;Internal/external aids;Functional tasks;SLP instruction and feedback;Patient/family education    Potential to Achieve Goals Good    Potential Considerations Other (comment)   time post onset   Consulted and Agree with Plan of Care Patient;Family member/caregiver               Aliene Altes, [place] 06/21/2022, 1:20 PM

## 2022-06-23 ENCOUNTER — Encounter: Payer: Self-pay | Admitting: Neurosurgery

## 2022-06-23 ENCOUNTER — Ambulatory Visit: Payer: Medicare (Managed Care) | Admitting: Physical Therapy

## 2022-06-23 ENCOUNTER — Ambulatory Visit (INDEPENDENT_AMBULATORY_CARE_PROVIDER_SITE_OTHER): Payer: Medicare (Managed Care) | Admitting: Neurosurgery

## 2022-06-23 ENCOUNTER — Ambulatory Visit: Payer: Medicare (Managed Care) | Admitting: Occupational Therapy

## 2022-06-23 ENCOUNTER — Encounter: Payer: Medicare (Managed Care) | Admitting: Speech Pathology

## 2022-06-23 VITALS — BP 102/72 | Ht 67.99 in | Wt 117.0 lb

## 2022-06-23 DIAGNOSIS — M546 Pain in thoracic spine: Secondary | ICD-10-CM

## 2022-06-23 DIAGNOSIS — Z981 Arthrodesis status: Secondary | ICD-10-CM

## 2022-06-23 DIAGNOSIS — M5010 Cervical disc disorder with radiculopathy, unspecified cervical region: Secondary | ICD-10-CM | POA: Diagnosis not present

## 2022-06-23 DIAGNOSIS — G8929 Other chronic pain: Secondary | ICD-10-CM | POA: Diagnosis not present

## 2022-06-23 NOTE — Progress Notes (Signed)
06/27/2022 Cory Hayes [id number] 1975/12/08  Referring provider: [facility] Primary GI doctor: Dr. [name]  ASSESSMENT AND PLAN:   Assessment: 46 y.o. male here for assessment of the following: 1. Calculus of gallbladder without cholecystitis without obstruction   2. Liver cyst   3. Diarrhea, unspecified type   4. Screen for colon cancer   5. Abdominal pain, epigastric   6. Cachexia ([place])   7. 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  No AB pain, no symptoms, has some epigastric pain on palpation only.   Plan: Add fiber, given FODMAP diet, if returns will call the office.  If he has symptoms of rectal bleeding, etc will call us, and consider endoscopy. Will revisit after PT/regains strength.  Simple and complex small liver cyst, will likely need repeat AB [place] versus MRI in 6 months to a year.  Will get RUQ [place] to evaluate GB better, discussed symptoms.    Orders Placed This Encounter  Procedures   US ABDOMEN LIMITED RUQ (LIVER/GB)   IgA   Tissue transglutaminase, IgA   H. pylori antibody, IgG   Comprehensive metabolic panel      Patient Care Team: [facility] as PCP - General Rebekah [place], LCSW as Social Worker (Licensed Clinical Social Worker)  HISTORY OF PRESENT ILLNESS: 46 y.o. male with a past medical history of aortic atherosclerosis, epilepsy, depression, chronic pain syndrome, right parietal astrocytoma history of multiple resections/cranial surgeries in 2008, 2015, 2022 at [facility], follows with oncology and others listed below presents for evaluation of liver lesion.   04/27/2022 C. difficile negative O&P stool negative 05/13/2022 CT abdomen pelvis without contrast for abdominal pain diarrhea for a week showed no acute findings  within abdomen or pelvis.  Multiple low-density liver lesions consistent with cysts but not fully defined suggest liver MRI. GB contracted, small gallstone in gallbladder fundus.  06/01/2022 MRI abdomen with and without contrast for indeterminant hepatic cyst showed numerous hepatic cyst including simple and minimally complex cyst no suspicious hepatic mass few subcentimeter renal cysts  He has been having loose stools x 6 months off and on since starting on chemo x 7 rounds and had decreased appetite.  Denies nausea, vomiting.  BM's 2-3 x a day and loose stool, states last 2 weeks has had some formed stools.  Denies blood in stool/black stool.  Denies AB pain, has some right should blade discomfort but more  In wheel chair, need assistance with standing/transferring.  He denies GERD.  Has some trouble swallowing, started after 3rd surgery for his astrocytoma, follows with speech therapy. Never symptoms in esophagus, all in his mouth/oropharynx.  No NSAIDS, no ETOH.   Current Medications:      Current Outpatient Medications (Analgesics):    acetaminophen (TYLENOL) 500 MG tablet, Take 500 mg by mouth every 6 (six) hours as needed.  Current Outpatient Medications (Hematological):    cyanocobalamin 1000 MCG tablet, Take 500 mcg by mouth.   folic acid (FOLVITE) 1 MG tablet, Take 1 mg by mouth daily.  Current Outpatient Medications (Other):    cholecalciferol (VITAMIN D3) 25 MCG (1000 UNIT) tablet, Take 1,000 Units by mouth daily.   cloBAZam (ONFI) 10 MG tablet, Take 1 tablet by mouth 2 (two) times daily.   divalproex (DEPAKOTE) 250 MG DR tablet, Take 5 tablets by mouth  2 (two) times daily.   levETIRAcetam (KEPPRA) 1000 MG tablet, Take 2,000 mg by mouth 2 (two) times daily.   melatonin 3 MG TABS tablet, Take 1 tablet by mouth daily as needed.   ondansetron (ZOFRAN) 4 MG tablet, Take 4 mg by mouth every 8 (eight) hours as needed.   pantoprazole (PROTONIX) 40 MG tablet, Take 1 tablet (40 mg  total) by mouth daily.   Potassium Chloride ER 20 MEQ TBCR, TAKE 2 TABLETS (40 MEQ TOTAL) BY MOUTH 2 (TWO) TIMES DAILY WITH MEALS FOR 30 DAYS (Patient taking differently: Take 20 mEq by mouth daily.)   sodium chloride 1 g tablet, Take 1 g by mouth daily at 12 noon.  Medical History:  Past Medical History:  Diagnosis Date   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[place]) 2008   Brain cancer ([place])    Blastoma   Seizures ([place])    Allergies:  Allergies  Allergen Reactions   Lorazepam     High fever     Surgical History:  He  has a past surgical history that includes Craniotomy (2008, 2015, 12/21/20) and Back surgery (2017). Family History:  His family history includes Heart attack in his maternal grandmother; Heart disease in his maternal grandmother; Ovarian cancer in his maternal grandmother; Prostate cancer in his maternal grandfather; Stomach cancer in his maternal grandmother; Throat cancer in his father. Social History:   reports that he quit smoking about 8 years ago. His smoking use included cigarettes. He has never used smokeless tobacco. He reports that he does not currently use alcohol. He reports current drug use. Drug: Marijuana.  REVIEW OF SYSTEMS  : All other systems reviewed and negative except where noted in the History of Present Illness.   PHYSICAL EXAM: BP 110/78   Pulse 76   Ht '5\' 8"'$  (1.727 m)   Wt 111 lb (50.3 kg)   BMI 16.88 kg/m  General:   cachetic male in wheelchair Head:   Normocephalic and atraumatic. Eyes:  sclerae anicteric,conjunctive pink  Heart:   regular rate and rhythm Pulm:  Clear anteriorly; no wheezing Abdomen:   Soft, Flat AB, Sluggish bowel sounds. mild tenderness in the epigastrium. Without guarding and Without rebound, No organomegaly appreciated. Rectal: Not evaluated Extremities:  Without edema. Msk: muscle wasting, thoracic/cervical kyphosis versus weakness. Neurologic:  Alert and  oriented x4;  left sided weakness. Wheelchair bound. Skin:   Dry and intact  without significant lesions or rashes. Psychiatric:  Cooperative. Normal mood and affect.    Vladimir Crofts, PA-C 10:06 AM

## 2022-06-23 NOTE — Progress Notes (Signed)
Referring Physician:  P[facility], [facility] [address] [place],  [place] [address]  Primary Physician:  [facility]  History of Present Illness: 06/23/2022 [name] is a 46 y.o with a history of seizures, astrocytoma, liver and kidney cysts  who is here today for further evaluation of cervical and thoracic pain.  He states that he underwent multiple cranial surgeries in 2008, 2015, and 2022 at [facility] for intracranial mass.  He was left with left-sided weakness after his most recent surgery.  He has had a chronic ongoing neck and back pain and attributes this primarily to a thoracic fusion which he underwent due to a fracture.  He states that he had a fall downstairs due to an epileptic event resulting in this fracture.  He has since had mid back pain which is severe and unrelenting.  He also describes pain in between his shoulder blades and into his neck with numbness in his bilateral arms and nonspecific pattern.  He has been unable to primarily take care of himself since his last cranial surgery which is very distressing to him as he was previously very independent.  He is currently taking Tylenol as needed for pain.   Conservative measures:  Physical therapy: Currently participating in home physical therapy Multimodal medical therapy including regular antiinflammatories: Tylenol Injections: No epidural steroid injections  Past Surgery: T3-T7 fusion  Gabrian Schueler has no symptoms of cervical myelopathy.  The symptoms are causing a significant impact on the patient's life.   Review of Systems:  A 10 point review of systems is negative, except for the pertinent positives and negatives detailed in the HPI.  Past Medical History: Past Medical History:  Diagnosis Date   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[address]) 2008   Seizures [facility])     Past Surgical History: Past Surgical History:  Procedure Laterality Date   BACK SURGERY  2017   CRANIOTOMY  2008, 2015, 12/21/20     Allergies: Allergies as of 06/23/2022 - Review Complete 06/07/2022  Allergen Reaction Noted   Lorazepam  12/23/2021    Medications: Outpatient Encounter Medications as of 06/23/2022  Medication Sig   acetaminophen (TYLENOL) 500 MG tablet Take 500 mg by mouth every 6 (six) hours as needed.   ascorbic acid (VITAMIN C) 500 MG tablet Take by mouth.   cloBAZam (ONFI) 10 MG tablet Take 1 tablet by mouth 2 (two) times daily.   cyanocobalamin 1000 MCG tablet Take by mouth.   divalproex (DEPAKOTE) 250 MG DR tablet Take 5 tablets by mouth 2 (two) times daily.   lactobacillus acidophilus & bulgar (LACTINEX) chewable tablet Chew 1 tablet by mouth 3 (three) times daily with meals. (Patient not taking: Reported on 05/24/2022)   lactobacillus acidophilus & bulgar (LACTINEX) chewable tablet Chew 1 tablet by mouth as needed (take it after every stools). (Patient not taking: Reported on 05/24/2022)   levETIRAcetam (KEPPRA) 1000 MG tablet Take 2,000 mg by mouth 2 (two) times daily.   lidocaine (LIDODERM) 5 % Place 1 patch onto the skin daily. Remove & Discard patch within 12 hours or as directed by MD   lomustine (CEENU) 40 MG capsule Every 6 weeks.   melatonin 3 MG TABS tablet Take 1 tablet by mouth daily as needed.   metaxalone (SKELAXIN) 400 MG tablet Take 0.5-1 tablets (200-400 mg total) by mouth at bedtime.   ondansetron (ZOFRAN) 4 MG tablet Take 4 mg by mouth every 8 (eight) hours as needed.   pantoprazole (PROTONIX) 40 MG tablet Take 1 tablet (  40 mg total) by mouth daily.   Potassium Chloride ER 20 MEQ TBCR TAKE 2 TABLETS (40 MEQ TOTAL) BY MOUTH 2 (TWO) TIMES DAILY WITH MEALS FOR 30 DAYS (Patient taking differently: Take 20 mEq by mouth daily.)   sodium chloride 1 g tablet Take 1 g by mouth 2 (two) times daily with a meal.   sodium chloride 1 g tablet Take by mouth.   No facility-administered encounter medications on file as of 06/23/2022.    Social History: Social History   Tobacco Use    Smoking status: Former    Types: Cigarettes    Quit date: 2015    Years since quitting: 8.6   Smokeless tobacco: Never  Vaping Use   Vaping Use: Every day  Substance Use Topics   Alcohol use: Not Currently    Comment: over 2 years ago   Drug use: Yes    Types: Marijuana    Comment: smokes daily per patient    Family Medical History: Family History  Problem Relation Age of Onset   Throat cancer Father    Heart attack Maternal Grandmother    Ovarian cancer Maternal Grandmother    Stomach cancer Maternal Grandmother    Heart disease Maternal Grandmother    Prostate cancer Maternal Grandfather     Physical Examination: Today's Vitals   06/23/22 1315  BP: 102/72  Weight: 53.1 kg  Height: 5' 7.99" (1.727 m)  PainSc: 8   PainLoc: Back   Body mass index is 17.8 kg/m.   General: Cachectic, calm, collected, and in no apparent distress. Attention to examination is appropriate.  Psychiatric: Patient is non-anxious.  Head:  Pupils equal, round, and reactive to light.  ENT:  Oral mucosa appears well hydrated.  Neck:   Supple.    Respiratory: Patient is breathing without any difficulty.  Extremities: No edema.  Vascular: Palpable dorsal pedal pulses.  Skin:   On exposed skin, there are no abnormal skin lesions.  NEUROLOGICAL:     Awake, alert, oriented to person, place, and time.  Speech is clear and fluent. Fund of knowledge is appropriate.   Cranial Nerves: Pupils equal round and reactive to light.  Facial tone is symmetric.  Facial sensation is symmetric.  ROM of spine: limited cervical ROM.  Has a tendency to rest in flexion palpation of spine: Diffuse and nonspecific spinal tenderness  Strength: Side Biceps Triceps Deltoid Interossei Grip Wrist Ext. Wrist Flex.  R '5 5 5 5 5 5 5  '$ L 4 4- 3 4 4- 4 4   Side Iliopsoas Quads Hamstring PF DF EHL  R '5 5 5 5 5 5  '$ L 0 2 0 0 0 0   Reflexes are 2+ and symmetric at the biceps, triceps, brachioradialis, patella and  achilles.   Hoffman's is absent.  Clonus is not present.  Toes are down-going.  Bilateral upper and lower extremity sensation is intact to light touch.    Gait untested.  Patient is wheelchair-bound secondary to left-sided weakness and has a left AFO brace in place.  Medical Decision Making  Imaging: 04/04/22 lumbar xrays FINDINGS: Lumbar vertebral body height are preserved without evidence of fracture. Minimal grade 1 retrolisthesis of L2 on L3 and L3 on L4. No spondylolysis identified. Intervertebral disc spaces are preserved.   IMPRESSION: No acute fracture or malalignment identified.     Electronically Signed   By: Ofilia Neas M.D.   On: 04/04/2022 15:48  05/18/22 MRI brain IMPRESSION: Postsurgical changes with stable small  foci of parenchymal  enhancement in the anterior and medial aspect of the resection cavity. No  significant change. No acute findings.   Electronically Signed by:  Lester , MD, [facility]  Electronically Signed on:  05/18/2022 11:14 AM  I have personally reviewed the images and agree with the above interpretation.  Assessment and Plan: Mr. Shiflet is a pleasant 47 y.o. 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  He does appear to have a chin to chest deformity on exam however he does not have any corresponding cervical imaging.  I like to obtain a cervical scoliosis study to further evaluate this in his thoracic hardware.  I will also obtain cervical and thoracic MRIs for further evaluation.  Unfortunately uncertain that the patient would benefit from any neurosurgical intervention as I am not sure that he is healthy enough to undergo any elective surgeries however we will reevaluate this after his imaging.  I will see him back in person per his request to review his imaging once completed.  I have also placed a referral to pain management as  the patient is had ongoing pain that is not being treated.  Thank you for involving me in the care of this patient.   I spent a total of 45 minutes in both face-to-face and non-face-to-face activities for this visit on the date of this encounter including reviewing outside records, reviewing imaging, discussing patient's symptoms, obtaining physical exam, discussing possible treatment options, and documentation.   [facility]

## 2022-06-27 ENCOUNTER — Other Ambulatory Visit (INDEPENDENT_AMBULATORY_CARE_PROVIDER_SITE_OTHER): Payer: Medicare (Managed Care)

## 2022-06-27 ENCOUNTER — Telehealth: Payer: Self-pay

## 2022-06-27 ENCOUNTER — Ambulatory Visit: Payer: Medicare Other

## 2022-06-27 ENCOUNTER — Ambulatory Visit (INDEPENDENT_AMBULATORY_CARE_PROVIDER_SITE_OTHER): Payer: Medicare (Managed Care) | Admitting: Physician Assistant

## 2022-06-27 ENCOUNTER — Encounter: Payer: Self-pay | Admitting: Physician Assistant

## 2022-06-27 VITALS — BP 110/78 | HR 76 | Ht 68.0 in | Wt 111.0 lb

## 2022-06-27 DIAGNOSIS — R1013 Epigastric pain: Secondary | ICD-10-CM

## 2022-06-27 DIAGNOSIS — K8020 Calculus of gallbladder without cholecystitis without obstruction: Secondary | ICD-10-CM

## 2022-06-27 DIAGNOSIS — R64 Cachexia: Secondary | ICD-10-CM | POA: Diagnosis not present

## 2022-06-27 DIAGNOSIS — K7689 Other specified diseases of liver: Secondary | ICD-10-CM | POA: Diagnosis not present

## 2022-06-27 DIAGNOSIS — Z1211 Encounter for screening for malignant neoplasm of colon: Secondary | ICD-10-CM | POA: Diagnosis not present

## 2022-06-27 DIAGNOSIS — C719 Malignant neoplasm of brain, unspecified: Secondary | ICD-10-CM | POA: Diagnosis not present

## 2022-06-27 DIAGNOSIS — R197 Diarrhea, unspecified: Secondary | ICD-10-CM

## 2022-06-27 LAB — COMPREHENSIVE METABOLIC PANEL
ALT: 10 U/L (ref 0–53)
AST: 15 U/L (ref 0–37)
Albumin: 3.2 g/dL — ABNORMAL LOW (ref 3.5–5.2)
Alkaline Phosphatase: 53 U/L (ref 39–117)
BUN: 14 mg/dL (ref 6–23)
CO2: 27 mEq/L (ref 19–32)
Calcium: 8.9 mg/dL (ref 8.4–10.5)
Chloride: 103 mEq/L (ref 96–112)
Creatinine, Ser: 0.73 mg/dL (ref 0.40–1.50)
GFR: 110.17 mL/min (ref >60.00)
Glucose, Bld: 78 mg/dL (ref 70–99)
Potassium: 4 mEq/L (ref 3.5–5.1)
Sodium: 137 mEq/L (ref 135–145)
Total Bilirubin: 0.7 mg/dL (ref 0.2–1.2)
Total Protein: 6.5 g/dL (ref 6.0–8.3)

## 2022-06-27 LAB — H. PYLORI ANTIBODY, IGG: H Pylori IgG: NEGATIVE

## 2022-06-27 NOTE — Progress Notes (Signed)
Agree with assessment / plan as outlined.  

## 2022-06-27 NOTE — Patient Instructions (Addendum)
Your provider has requested that you go to the basement level for lab work before leaving today. Press "B" on the elevator. The lab is located at the first door on the left as you exit the elevator.  You have been scheduled for an abdominal ultrasound at [facility] (1st floor of hospital, at the medical mall) on 07/01/2022 at 9:00 am. Please arrive 15 minutes prior to your appointment for registration. Make certain not to have anything to eat or drink 6 hours prior to your appointment. Should you need to reschedule your appointment, please contact radiology at [phone]. This test typically takes about 30 minutes to perform.   FIBER SUPPLEMENT You can do metamucil or fibercon once or twice a day but if this causes gas/bloating please switch to Benefiber or Citracel.  Fiber is good for constipation/diarrhea/irritable bowel syndrome.  It can also help with weight loss and can help lower your bad cholesterol (LDL).  Please do 1 TBSP in the morning in water, coffee, or tea.  It can take up to a month before you can see a difference with your bowel movements.  It is cheapest from [employer], [employer], [employer].     FODMAP stands for fermentable oligo-, di-, mono-saccharides and polyols (1). These are the scientific terms used to classify groups of carbs that are notorious for triggering digestive symptoms like bloating, gas and stomach pain.

## 2022-06-27 NOTE — Telephone Encounter (Signed)
Patient's mother called in asking if patient could have small sips of water the day of ultrasound since patient has to take seizure medication. She has been advised it would be fine to take small sips with medications the morning of.

## 2022-06-28 ENCOUNTER — Ambulatory Visit: Payer: Medicare (Managed Care) | Admitting: Occupational Therapy

## 2022-06-28 ENCOUNTER — Ambulatory Visit: Payer: Medicare (Managed Care) | Admitting: Speech Pathology

## 2022-06-29 LAB — TISSUE TRANSGLUTAMINASE, IGA: (tTG) Ab, IgA: <1 U/mL

## 2022-06-29 LAB — IGA: Immunoglobulin A: 309 mg/dL (ref 47–310)

## 2022-06-30 ENCOUNTER — Ambulatory Visit: Payer: Medicare (Managed Care) | Admitting: Surgery

## 2022-07-01 ENCOUNTER — Ambulatory Visit
Admission: RE | Admit: 2022-07-01 | Discharge: 2022-07-01 | Disposition: A | Discharge status: Home / Self Care | Payer: Medicare (Managed Care) | Source: Ambulatory Visit | Attending: Physician Assistant | Admitting: Physician Assistant

## 2022-07-01 DIAGNOSIS — K8020 Calculus of gallbladder without cholecystitis without obstruction: Secondary | ICD-10-CM | POA: Diagnosis not present

## 2022-07-01 DIAGNOSIS — K7689 Other specified diseases of liver: Secondary | ICD-10-CM | POA: Diagnosis not present

## 2022-07-05 ENCOUNTER — Ambulatory Visit: Payer: Medicare (Managed Care) | Admitting: Occupational Therapy

## 2022-07-05 ENCOUNTER — Ambulatory Visit: Payer: Medicare (Managed Care) | Admitting: Unknown Physician Specialty

## 2022-07-06 ENCOUNTER — Ambulatory Visit: Payer: Medicare Other

## 2022-07-07 ENCOUNTER — Ambulatory Visit: Payer: Medicare (Managed Care) | Admitting: Unknown Physician Specialty

## 2022-07-07 ENCOUNTER — Ambulatory Visit: Payer: Medicare (Managed Care) | Admitting: Surgery

## 2022-07-08 ENCOUNTER — Ambulatory Visit: Payer: Medicare Other

## 2022-07-11 ENCOUNTER — Ambulatory Visit (INDEPENDENT_AMBULATORY_CARE_PROVIDER_SITE_OTHER): Payer: Medicare (Managed Care) | Admitting: *Deleted

## 2022-07-11 DIAGNOSIS — Z0000 Encounter for general adult medical examination without abnormal findings: Secondary | ICD-10-CM

## 2022-07-11 NOTE — Patient Instructions (Addendum)
Cory Hayes , Thank you for taking time to come for your Medicare Wellness Visit. I appreciate your ongoing commitment to your health goals. Please review the following plan we discussed and let me know if I can assist you in the future.   Screening recommendations/referrals:  Recommended yearly ophthalmology/optometry visit for glaucoma screening and checkup Recommended yearly dental visit for hygiene and checkup  Vaccinations: Influenza vaccine: Education provided Tdap vaccine: up to date     Advanced directives: Education provided  Preventive Care 45 Years and Older, Male Preventive care refers to lifestyle choices and visits with your health care provider that can promote health and wellness. What does preventive care include? A yearly physical exam. This is also called an annual well check. Dental exams once or twice a year. Routine eye exams. Ask your health care provider how often you should have your eyes checked. Personal lifestyle choices, including: Daily care of your teeth and gums. Regular physical activity. Eating a healthy diet. Avoiding tobacco and drug use. Limiting alcohol use. Practicing safe sex. Taking low doses of aspirin every day. Taking vitamin and mineral supplements as recommended by your health care provider. What happens during an annual well check? The services and screenings done by your health care provider during your annual well check will depend on your age, overall health, lifestyle risk factors, and family history of disease. Counseling  Your health care provider may ask you questions about your: Alcohol use. Tobacco use. Drug use. Emotional well-being. Home and relationship well-being. Sexual activity. Eating habits. History of falls. Memory and ability to understand (cognition). Work and work Statistician. Screening  You may have the following tests or measurements: Height, weight, and BMI. Blood pressure. Lipid and cholesterol  levels. These may be checked every 5 years, or more frequently if you are over 12 years old. Skin check. Lung cancer screening. You may have this screening every year starting at age 45 if you have a 30-pack-year history of smoking and currently smoke or have quit within the past 15 years. Fecal occult blood test (FOBT) of the stool. You may have this test every year starting at age 45. Flexible sigmoidoscopy or colonoscopy. You may have a sigmoidoscopy every 5 years or a colonoscopy every 10 years starting at age 45. Prostate cancer screening. Recommendations will vary depending on your family history and other risks. Hepatitis C blood test. Hepatitis B blood test. Sexually transmitted disease (STD) testing. Diabetes screening. This is done by checking your blood sugar (glucose) after you have not eaten for a while (fasting). You may have this done every 1-3 years. Abdominal aortic aneurysm (AAA) screening. You may need this if you are a current or former smoker. Osteoporosis. You may be screened starting at age 45 if you are at high risk. Talk with your health care provider about your test results, treatment options, and if necessary, the need for more tests. Vaccines  Your health care provider may recommend certain vaccines, such as: Influenza vaccine. This is recommended every year. Tetanus, diphtheria, and acellular pertussis (Tdap, Td) vaccine. You may need a Td booster every 10 years. Zoster vaccine. You may need this after age 45. Pneumococcal 13-valent conjugate (PCV13) vaccine. One dose is recommended after age 45. Pneumococcal polysaccharide (PPSV23) vaccine. One dose is recommended after age 45. Talk to your health care provider about which screenings and vaccines you need and how often you need them. This information is not intended to replace advice given to you by your health care  provider. Make sure you discuss any questions you have with your health care provider. Document  Released: 11/27/2015 Document Revised: 07/20/2016 Document Reviewed: 09/01/2015 Elsevier Interactive Patient Education  2017 [employer] Prevention in the Home Falls can cause injuries. They can happen to people of all ages. There are many things you can do to make your home safe and to help prevent falls. What can I do on the outside of my home? Regularly fix the edges of walkways and driveways and fix any cracks. Remove anything that might make you trip as you walk through a door, such as a raised step or threshold. Trim any bushes or trees on the path to your home. Use bright outdoor lighting. Clear any walking paths of anything that might make someone trip, such as rocks or tools. Regularly check to see if handrails are loose or broken. Make sure that both sides of any steps have handrails. Any raised decks and porches should have guardrails on the edges. Have any leaves, snow, or ice cleared regularly. Use sand or salt on walking paths during winter. Clean up any spills in your garage right away. This includes oil or grease spills. What can I do in the bathroom? Use night lights. Install grab bars by the toilet and in the tub and shower. Do not use towel bars as grab bars. Use non-skid mats or decals in the tub or shower. If you need to sit down in the shower, use a plastic, non-slip stool. Keep the floor dry. Clean up any water that spills on the floor as soon as it happens. Remove soap buildup in the tub or shower regularly. Attach bath mats securely with double-sided non-slip rug tape. Do not have throw rugs and other things on the floor that can make you trip. What can I do in the bedroom? Use night lights. Make sure that you have a light by your bed that is easy to reach. Do not use any sheets or blankets that are too big for your bed. They should not hang down onto the floor. Have a firm chair that has side arms. You can use this for support while you get dressed. Do  not have throw rugs and other things on the floor that can make you trip. What can I do in the kitchen? Clean up any spills right away. Avoid walking on wet floors. Keep items that you use a lot in easy-to-reach places. If you need to reach something above you, use a strong step stool that has a grab bar. Keep electrical cords out of the way. Do not use floor polish or wax that makes floors slippery. If you must use wax, use non-skid floor wax. Do not have throw rugs and other things on the floor that can make you trip. What can I do with my stairs? Do not leave any items on the stairs. Make sure that there are handrails on both sides of the stairs and use them. Fix handrails that are broken or loose. Make sure that handrails are as long as the stairways. Check any carpeting to make sure that it is firmly attached to the stairs. Fix any carpet that is loose or worn. Avoid having throw rugs at the top or bottom of the stairs. If you do have throw rugs, attach them to the floor with carpet tape. Make sure that you have a light switch at the top of the stairs and the bottom of the stairs. If you do not have  them, ask someone to add them for you. What else can I do to help prevent falls? Wear shoes that: Do not have high heels. Have rubber bottoms. Are comfortable and fit you well. Are closed at the toe. Do not wear sandals. If you use a stepladder: Make sure that it is fully opened. Do not climb a closed stepladder. Make sure that both sides of the stepladder are locked into place. Ask someone to hold it for you, if possible. Clearly mark and make sure that you can see: Any grab bars or handrails. First and last steps. Where the edge of each step is. Use tools that help you move around (mobility aids) if they are needed. These include: Canes. Walkers. Scooters. Crutches. Turn on the lights when you go into a dark area. Replace any light bulbs as soon as they burn out. Set up your  furniture so you have a clear path. Avoid moving your furniture around. If any of your floors are uneven, fix them. If there are any pets around you, be aware of where they are. Review your medicines with your doctor. Some medicines can make you feel dizzy. This can increase your chance of falling. Ask your doctor what other things that you can do to help prevent falls. This information is not intended to replace advice given to you by your health care provider. Make sure you discuss any questions you have with your health care provider. Document Released: 08/27/2009 Document Revised: 04/07/2016 Document Reviewed: 12/05/2014 Elsevier Interactive Patient Education  2017 [employer].

## 2022-07-11 NOTE — Progress Notes (Signed)
Subjective:   Cory Hayes is a 46 y.o. male who presents for Medicare Annual/Subsequent preventive examination.  I connected with  Pranit Gildner  Becky Nowland-Bieschke assisted on 07/11/22 by a telephone enabled telemedicine application and verified that I am speaking with the correct person using two identifiers.   I discussed the limitations of evaluation and management by telemedicine. The patient expressed understanding and agreed to proceed.  Patient location: home  Provider location: Tele-Health-home    Review of Systems     Cardiac Risk Factors include: advanced age (>71mn, >>69women)     Objective:    Today's Vitals   There is no height or weight on file to calculate BMI.     07/11/2022   12:10 PM 03/31/2022   11:15 AM 07/05/2021    9:12 AM 11/22/2019    6:56 AM  Advanced Directives  Does Patient Have a Medical Advance Directive? Yes Yes Yes No  Type of AIndustrial/product designerof [place] will   Copy of HOrosiin Chart? Yes - validated most recent copy scanned in chart (See row information)  No - copy requested   Would patient like information on creating a medical advance directive? No - Patient declined   No - Patient declined    Current Medications (verified) Outpatient Encounter Medications as of 07/11/2022  Medication Sig   acetaminophen (TYLENOL) 500 MG tablet Take 500 mg by mouth every 6 (six) hours as needed.   cholecalciferol (VITAMIN D3) 25 MCG (1000 UNIT) tablet Take 1,000 Units by mouth daily.   cloBAZam (ONFI) 10 MG tablet Take 1 tablet by mouth 2 (two) times daily.   cyanocobalamin 1000 MCG tablet Take 500 mcg by mouth.   folic acid (FOLVITE) 1 MG tablet Take 1 mg by mouth daily.   levETIRAcetam (KEPPRA) 1000 MG tablet Take 2,000 mg by mouth 2 (two) times daily.   melatonin 3 MG TABS tablet Take 1 tablet by mouth daily as needed.   ondansetron (ZOFRAN)  4 MG tablet Take 4 mg by mouth every 8 (eight) hours as needed.   pantoprazole (PROTONIX) 40 MG tablet Take 1 tablet (40 mg total) by mouth daily.   Potassium Chloride ER 20 MEQ TBCR TAKE 2 TABLETS (40 MEQ TOTAL) BY MOUTH 2 (TWO) TIMES DAILY WITH MEALS FOR 30 DAYS (Patient taking differently: Take 20 mEq by mouth daily.)   sodium chloride 1 g tablet Take 1 g by mouth daily at 12 noon.   divalproex (DEPAKOTE) 250 MG DR tablet Take 5 tablets by mouth 2 (two) times daily.   No facility-administered encounter medications on file as of 07/11/2022.    Allergies (verified) Lorazepam   History: Past Medical History:  Diagnosis Date   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 [place]   [place] [facility]    Blastoma   [facility]    Past Surgical History:  Procedure Laterality Date   BACK SURGERY  2017   CRANIOTOMY  2008, 2015, 12/21/20   Family History  Problem Relation Age of Onset   Throat cancer Father    Heart attack Maternal Grandmother    Ovarian cancer Maternal Grandmother    Stomach cancer Maternal Grandmother    Heart disease Maternal Grandmother    Prostate cancer Maternal Grandfather    Social History   Socioeconomic History   Marital status: Single    Spouse name: Not on file   Number of children: Not on file  Years of education: Not on file   Highest education level: Not on file  Occupational History   Not on file  Tobacco Use   Smoking status: Former    Types: Cigarettes    Quit date: 2015    Years since quitting: 8.6   Smokeless tobacco: Never  Vaping Use   Vaping Use: Former  Substance and Sexual Activity   Alcohol use: Not Currently    Comment: over 2 years ago   Drug use: Yes    Types: Marijuana    Comment: smokes daily per patient   Sexual activity: Not Currently  Other Topics Concern   Not on file  Social History Narrative   Not on file   Social Determinants of Health   Financial Resource Strain: Medium Risk (07/11/2022)   Overall Financial Resource Strain (CARDIA)     Difficulty of Paying Living Expenses: Somewhat hard  Food Insecurity: Food Insecurity Present (07/11/2022)   Hunger Vital Sign    Worried About Running Out of Food in the Last Year: Often true    Ran Out of Food in the Last Year: Often true  Transportation Needs: No Transportation Needs (07/11/2022)   PRAPARE - Hydrologist (Medical): No    Lack of Transportation (Non-Medical): No  Physical Activity: Insufficiently Active (07/05/2021)   Exercise Vital Sign    Days of Exercise per Week: 2 days    Minutes of Exercise per Session: 60 min  Stress: Stress Concern Present (07/11/2022)   [place]    Feeling of Stress : Rather much  Social Connections: Socially Isolated (07/11/2022)   Social Connection and Isolation Panel [NHANES]    Frequency of Communication with Friends and Family: More than three times a week    Frequency of Social Gatherings with Friends and Family: Once a week    Attends Religious Services: Never    Marine scientist or Organizations: No    Attends Music therapist: Never    Marital Status: Never married    Tobacco Counseling Counseling given: Not Answered   Clinical Intake:  Pre-visit preparation completed: Yes  Pain : 0-10 Pain Type: Chronic pain Pain Location: Neck Pain Descriptors / Indicators: Constant, Aching, Burning, Nagging Pain Onset: 1 to 4 weeks ago Pain Frequency: Constant Pain Relieving Factors: tylenol Effect of Pain on Daily Activities: yes  Pain Relieving Factors: tylenol  Nutritional Risks: None Diabetes: No  How often do you need to have someone help you when you read instructions, pamphlets, or other written materials from your doctor or pharmacy?: 3 - Sometimes  Diabetic?  n0  Interpreter Needed?: No  Comments: sister does help with going through chemo Information entered by :: Leroy Kennedy Lpn   Activities of  Daily Living    07/11/2022   12:17 PM  In your present state of health, do you have any difficulty performing the following activities:  Hearing? 0  Vision? 0  Difficulty concentrating or making decisions? 1  Walking or climbing stairs? 1  Dressing or bathing? 1  Doing errands, shopping? 1  Preparing Food and eating ? Y  Using the Toilet? Y  In the past six months, have you accidently leaked urine? Y  Do you have problems with loss of bowel control? N  Managing your Medications? Y  Managing your Finances? Y  Housekeeping or managing your Housekeeping? Y    Patient Care Team: Practice, [facility] as PCP -  Cammie Sickle, LCSW as Social Worker (Licensed Clinical Social Worker)  Indicate any recent [employer] you may have received from other than [facility] providers in the past year (date may be approximate).     Assessment:   This is a routine wellness examination for Artez.  Hearing/Vision screen Hearing Screening - Comments:: No trouble hearing Vision Screening - Comments:: Not up to date [facility]  Dietary issues and exercise activities discussed: Current Exercise Habits: The patient does not participate in regular exercise at present, Intensity: Mild, Exercise limited by: orthopedic condition(s)   Goals Addressed             This Visit's Progress    Patient Stated       No goals       Depression Screen    07/11/2022   12:21 PM 05/11/2022    1:20 PM 04/01/2022    2:27 PM 08/17/2021    1:58 PM 07/27/2021    2:23 PM 07/05/2021    9:14 AM 05/25/2021   10:41 AM  PHQ 2/9 Scores  PHQ - 2 Score '5 1 1 4 '$ 0 3 0  PHQ- 9 Score '19 12 12 13 '$ 0 9     Fall Risk    07/11/2022   12:09 PM 05/11/2022    1:16 PM 04/26/2022    3:13 PM 04/21/2022    1:55 PM 04/01/2022    2:28 PM  Fall Risk   Falls in the past year? '1 1 1 '$ 0 1  Number falls in past yr: '1 1 1  1  '$ Injury with Fall? 0 '1 1  1  '$ Risk for fall due to : Impaired balance/gait;Impaired mobility  Impaired balance/gait;Medication side effect Medication side effect;Impaired mobility;Impaired balance/gait  History of fall(s);Impaired balance/gait  Follow up Falls evaluation completed;Education provided;Falls prevention discussed Falls evaluation completed Falls evaluation completed  Falls evaluation completed    FALL RISK PREVENTION PERTAINING TO THE HOME:  Any stairs in or around the home? No  If so, are there any without handrails? No  Home free of loose throw rugs in walkways, pet beds, electrical cords, etc? Yes  Adequate lighting in your home to reduce risk of falls? Yes   ASSISTIVE DEVICES UTILIZED TO PREVENT FALLS:  Life alert? No  Use of a cane, walker or w/c? No  Grab bars in the bathroom? No  Shower chair or bench in shower? Yes  Elevated toilet seat or a handicapped toilet? Yes   TIMED UP AND GO:  Was the test performed? No .    Cognitive Function:        07/11/2022   12:14 PM 07/05/2021    9:20 AM  6CIT Screen  What Year? 0 points 0 points  What month? 3 points 0 points  What time? 0 points 3 points  Count back from 20 0 points 0 points  Months in reverse 4 points 2 points  Repeat phrase 10 points 2 points  Total Score 17 points 7 points    Immunizations Immunization History  Administered Date(s) Administered   Influenza-Unspecified 09/01/2020   PFIZER(Purple Top)SARS-COV-2 Vaccination 02/13/2020, 03/05/2020, 09/01/2020   Tdap 05/20/2021    TDAP status: Up to date  Flu Vaccine status: Due, Education has been provided regarding the importance of this vaccine. Advised may receive this vaccine at local pharmacy or Health Dept. Aware to provide a copy of the vaccination record if obtained from local pharmacy or Health Dept. Verbalized acceptance and understanding.    Covid-19  vaccine status: Information provided on how to obtain vaccines.   Qualifies for Shingles Vaccine? No   Zostavax completed No   Shingrix Completed?: No.    Education has been  provided regarding the importance of this vaccine. Patient has been advised to call insurance company to determine out of pocket expense if they have not yet received this vaccine. Advised may also receive vaccine at local pharmacy or Health Dept. Verbalized acceptance and understanding.  Screening Tests Health Maintenance  Topic Date Due   INFLUENZA VACCINE  06/14/2022   COVID-19 Vaccine (4 - Pfizer risk series) 07/27/2022 (Originally 10/27/2020)   Hepatitis C Screening  05/06/2023 (Originally 05/09/1995)   HIV Screening  05/06/2023 (Originally 05/08/1992)   COLONOSCOPY (Pts 45-7yr Insurance coverage will need to be confirmed)  07/12/2023 (Originally 05/08/2022)   TETANUS/TDAP  05/21/2031   HPV VACCINES  Aged Out    Health Maintenance  Health Maintenance Due  Topic Date Due   INFLUENZA VACCINE  06/14/2022    Colorectal cancer screening: No longer required.   Lung Cancer Screening: (Low Dose CT Chest recommended if Age 45-80years, 30 pack-year currently smoking OR have quit w/in 15years.) does not qualify.   Lung Cancer Screening Referral:   Additional Screening:  Hepatitis C Screening: does not qualify  Vision Screening: Recommended annual ophthalmology exams for early detection of glaucoma and other disorders of the eye. Is the patient up to date with their annual eye exam?  No  Who is the provider or what is the name of the office in which the patient attends annual eye exams? [facility] is not established with a provider, would they like to be referred to a provider to establish care? No .   Dental Screening: Recommended annual dental exams for proper oral hygiene  Community Resource Referral / Chronic Care Management: CRR required this visit?  No   CCM required this visit?  No      Plan:     I have personally reviewed and noted the following in the patient's chart:   Medical and social history Use of alcohol, tobacco or illicit drugs  Current  medications and supplements including opioid prescriptions. Patient is not currently taking opioid prescriptions. Functional ability and status Nutritional status Physical activity Advanced directives List of other physicians Hospitalizations, surgeries, and ER visits in previous 12 months Vitals Screenings to include cognitive, depression, and falls Referrals and appointments  In addition, I have reviewed and discussed with patient certain preventive protocols, quality metrics, and best practice recommendations. A written personalized care plan for preventive services as well as general preventive health recommendations were provided to patient.     JLeroy Kennedy LPN   81/19/1478  Nurse Notes: Patient mother (caregiver) states that oncology has put a referral Case Management in for them and was told they will get a phone call in a couple days

## 2022-07-12 ENCOUNTER — Ambulatory Visit
Admission: RE | Admit: 2022-07-12 | Discharge: 2022-07-12 | Disposition: A | Discharge status: Home / Self Care | Payer: Medicare (Managed Care) | Source: Ambulatory Visit | Attending: Neurosurgery | Admitting: Neurosurgery

## 2022-07-12 ENCOUNTER — Ambulatory Visit: Payer: Medicare (Managed Care) | Admitting: Occupational Therapy

## 2022-07-12 DIAGNOSIS — M5010 Cervical disc disorder with radiculopathy, unspecified cervical region: Secondary | ICD-10-CM | POA: Diagnosis not present

## 2022-07-12 DIAGNOSIS — G8929 Other chronic pain: Secondary | ICD-10-CM | POA: Diagnosis not present

## 2022-07-12 DIAGNOSIS — S22059A Unspecified fracture of T5-T6 vertebra, initial encounter for closed fracture: Secondary | ICD-10-CM | POA: Diagnosis not present

## 2022-07-12 DIAGNOSIS — M40204 Unspecified kyphosis, thoracic region: Secondary | ICD-10-CM | POA: Diagnosis not present

## 2022-07-12 DIAGNOSIS — M546 Pain in thoracic spine: Secondary | ICD-10-CM | POA: Diagnosis not present

## 2022-07-12 DIAGNOSIS — Z981 Arthrodesis status: Secondary | ICD-10-CM | POA: Insufficient documentation

## 2022-07-14 ENCOUNTER — Telehealth: Payer: Self-pay

## 2022-07-14 NOTE — Telephone Encounter (Signed)
-----   Message from Peggyann Shoals sent at 07/14/2022  9:44 AM EDT ----- Regarding: MRI results Contact: [phone] Barbara Keng pt's mom calling, he did complete his scans on 07/12/2022. Can you call her to review  the results.

## 2022-07-15 ENCOUNTER — Ambulatory Visit (INDEPENDENT_AMBULATORY_CARE_PROVIDER_SITE_OTHER): Payer: Medicare (Managed Care) | Admitting: Orthopedic Surgery

## 2022-07-15 ENCOUNTER — Telehealth: Payer: Medicare (Managed Care) | Admitting: Neurosurgery

## 2022-07-15 DIAGNOSIS — M47812 Spondylosis without myelopathy or radiculopathy, cervical region: Secondary | ICD-10-CM

## 2022-07-15 DIAGNOSIS — Z981 Arthrodesis status: Secondary | ICD-10-CM | POA: Diagnosis not present

## 2022-07-15 DIAGNOSIS — G8929 Other chronic pain: Secondary | ICD-10-CM | POA: Diagnosis not present

## 2022-07-15 DIAGNOSIS — M546 Pain in thoracic spine: Secondary | ICD-10-CM

## 2022-07-15 NOTE — Progress Notes (Signed)
Telephone Visit- Progress Note: Referring Physician:  Valerie Roys, DO [place],  [place] [address]  Primary Physician:  Cory Roys, DO  This visit was performed via telephone.  Patient/patient's mom location: home Provider location: office  I spent a total of 10 minutes non-face-to-face activities for this visit on the date of this encounter including review of current clinical condition and response to treatment.  History of Present Illness: Cory Hayes is a 46 y.o. male who is scheduled for a telephone visit to review MRI results of his neck and mid back. I spoke with his mother Cory Hayes- she was present with him in the room at his last visit with Andee Poles and helped with her history.   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 Last one in 2022 for mass left him with left sided weakness. Also with history of thoracic fusion T3-T7 that was done due to fracture from epileptic event.   He has recently been moved to hospice care. Mother states that is "not bouncing back as quickly as he usually does." She said hospice is keeping him comfortable.   Exam: No exam done as this was a telephone encounter.     Imaging: He did not have xrays completed.   MRI of cervical and thoracic spine dated 07/12/22:  FINDINGS: MRI CERVICAL SPINE FINDINGS   Alignment: Physiologic.   Vertebrae: No fracture, evidence of discitis, or bone lesion. Congenitally small spinal canal.   Cord: Normal signal and morphology.   Posterior Fossa, vertebral arteries, paraspinal tissues: Negative.   Disc levels:   C2-3: Facet degenerative changes resulting in mild bilateral neural foraminal narrowing. Minimal disc bulge. Mild spinal canal stenosis.   C3-4: Uncovertebral and facet degenerative changes resulting in moderate right and mild left neural foraminal narrowing. Minimal disc bulge. Mild spinal canal stenosis.   C4-5: Uncovertebral and facet  degenerative changes resulting in moderate right and mild left neural foraminal narrowing. Mild spinal canal stenosis.   C5-6: Uncovertebral and facet degenerative changes resulting severe right and mild left neural foraminal narrowing. No significant spinal canal stenosis.   C6-7: Uncovertebral and facet degenerative changes resulting in moderate right neural foraminal narrowing. No spinal canal stenosis.   C7-T1: No spinal canal or neural foraminal stenosis.   MRI THORACIC SPINE FINDINGS   Alignment:  Exaggerated thoracic kyphosis.   Vertebrae: No acute fracture, evidence of discitis or aggressive bone lesion. Chronic fractures of the T5 and T6 superior endplate with mild anterior wedging. Postsurgical changes from laminectomies and posterior fusion with transpedicular screws and connecting rods from T3 through T7 resulting in susceptibility artifact degrading partially the images at these levels.   Cord:  Normal signal and morphology.   Paraspinal and other soft tissues: T2 hyperintense hepatic lesions, likely cysts.   Disc levels:   No significant disc bulge or herniation, spinal canal or neural foraminal stenosis in the thoracic spine.   IMPRESSION: 1. 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 2. Multilevel neural foraminal narrowing at the cervical spine, as detailed above. 3. No significant spinal canal or neural foraminal stenosis in the thoracic spine. 4. Postsurgical changes from posterior spinal fusion from T3 through T7.     Electronically Signed   By: Pedro Earls M.D.   On: 07/12/2022 17:07  I have personally reviewed the images and agree with the above interpretation.  Assessment and  Plan: [name] is a pleasant 46 y.o. male with chronic mid back pain. 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  Last one in 2022 for mass left him with left sided weakness. Also with history of thoracic fusion T3-T7 that was done due to fracture from epileptic event.   He has known multilevel mild central/foraminal stenosis in cervical spine. No cord compression noted in thoracic spine.   Xrays to cervical/thoracic spine not done by patient.   Above treatment options discussed with his mother Cory Hayes and following plan made:   - Discussed that I did not see anything overly concerning on MRIs.  - He entered hospice care yesterday.  - If he feels better, would recommend he get xrays but he does not appear to be a surgical candidate.  - Mother will call with any further questions or concerns.   Geronimo Boot PA-C Neurosurgery

## 2022-07-15 NOTE — Telephone Encounter (Signed)
I added him for today at 1pm. She is available any time for a phone call.

## 2022-07-15 NOTE — Telephone Encounter (Signed)
Patient's mom Cory Hayes called back. His oncologist has called in Hospice yesterday. He will not be getting in the xrays done. Can you call her with the MRI results please her at [phone].I told her that [name] or Dr.Yarbrough will call her.

## 2022-07-19 ENCOUNTER — Telehealth: Payer: Self-pay | Admitting: Family Medicine

## 2022-07-19 ENCOUNTER — Ambulatory Visit: Payer: Medicare (Managed Care) | Attending: Nurse Practitioner | Admitting: Occupational Therapy

## 2022-07-19 ENCOUNTER — Encounter: Payer: Self-pay | Admitting: Occupational Therapy

## 2022-07-19 NOTE — Telephone Encounter (Signed)
Returned call and spoke with Ms. Jacqlyn Larsen. She stated that patient will no longer be coming here since he is now in Hospice per [facility] team.

## 2022-07-19 NOTE — Telephone Encounter (Signed)
Pt is now in hospice followed by his [facility] oncologist.  Pt will no longer be coming to [facility].  Mom Jacqlyn Larsen would like Tammy to call her when she gets a minute.  She said Tammy has been very supportive.

## 2022-07-19 NOTE — Therapy (Signed)
[place] MAIN [facility] SERVICES [address] [place], [place], [address] Phone: [phone]   Fax:  [phone]  Patient Details  Name: Cory Hayes MRN: [id number] Date of Birth: 11/20/1975 Referring Provider:  No ref. provider found  Encounter Date: 07/19/2022  Pt. Did not arrive for therapy this afternoon. An attempt was made to reach out to the Pt. with a phone call. A message was left on voicemail with our Rehab clinic's phone number.  Harrel Carina, MS, OTR/L   Harrel Carina, OT 07/19/2022, 5:07 PM  [place] MAIN [facility] SERVICES [address] [place], [place], [address] Phone: [phone]   Fax:  [phone]

## 2022-07-21 ENCOUNTER — Ambulatory Visit: Payer: Medicare (Managed Care) | Admitting: Occupational Therapy

## 2022-07-21 ENCOUNTER — Ambulatory Visit: Payer: Medicare (Managed Care) | Admitting: Surgery

## 2022-07-26 ENCOUNTER — Encounter: Payer: Medicare (Managed Care) | Admitting: Occupational Therapy

## 2022-07-26 ENCOUNTER — Encounter: Payer: Self-pay | Admitting: Occupational Therapy

## 2022-07-26 NOTE — Therapy (Signed)
[place] MAIN [facility] SERVICES [address] [place], [place], [address] Phone: [phone]   Fax:  [phone]  July 26, 2022    No Recipients  Occupational Therapy Discharge Summary   Patient: Cory Hayes MRN: [id number] Date of Birth: [birth year]  Diagnosis: No diagnosis found.  Referring Provider (OT): Cheng-Ja Southerland   The above patient had been seen in Occupational Therapy 10 visits.  The treatment consisted of ADL training, UE there. Ex, manual therapy, neuromuscular re-education, and PT/caregiver education about positioning.  Pt.'s mother phoned our rehab clinic to cancel the remainder of therapy visits as the Pt is transitioning to [facility]. OT services are discharged at this time.   Sincerely,  Harrel Carina, MS, OTR/L  CC No Recipients  [place] MAIN [facility] SERVICES [address] [place], [place], [address] Phone: [phone]   Fax:  [phone]  Patient: Cory Hayes MRN: [id number] Date of Birth: 21-Mar-1976

## 2022-07-28 ENCOUNTER — Encounter: Payer: Medicare (Managed Care) | Admitting: Occupational Therapy

## 2022-08-01 ENCOUNTER — Encounter: Payer: Medicare (Managed Care) | Admitting: Speech Pathology

## 2022-08-01 ENCOUNTER — Encounter: Payer: Medicare (Managed Care) | Admitting: Occupational Therapy

## 2022-08-02 ENCOUNTER — Encounter: Payer: Medicare (Managed Care) | Admitting: Occupational Therapy

## 2022-08-03 ENCOUNTER — Encounter: Payer: Medicare (Managed Care) | Admitting: Occupational Therapy

## 2022-08-03 ENCOUNTER — Encounter: Payer: Medicare (Managed Care) | Admitting: Speech Pathology

## 2022-08-04 ENCOUNTER — Encounter: Payer: Medicare (Managed Care) | Admitting: Occupational Therapy

## 2022-08-08 ENCOUNTER — Encounter: Payer: Medicare (Managed Care) | Admitting: Speech Pathology

## 2022-08-08 ENCOUNTER — Encounter: Payer: Medicare (Managed Care) | Admitting: Occupational Therapy

## 2022-08-10 ENCOUNTER — Encounter: Payer: Medicare (Managed Care) | Admitting: Occupational Therapy

## 2022-08-10 ENCOUNTER — Encounter: Payer: Medicare (Managed Care) | Admitting: Speech Pathology

## 2022-08-15 ENCOUNTER — Encounter: Payer: Medicare (Managed Care) | Admitting: Speech Pathology

## 2022-08-15 ENCOUNTER — Encounter: Payer: Medicare (Managed Care) | Admitting: Occupational Therapy

## 2022-08-17 ENCOUNTER — Encounter: Payer: Medicare (Managed Care) | Admitting: Speech Pathology

## 2022-08-17 ENCOUNTER — Encounter: Payer: Medicare (Managed Care) | Admitting: Occupational Therapy

## 2022-08-22 ENCOUNTER — Encounter: Payer: Medicare (Managed Care) | Admitting: Occupational Therapy

## 2022-08-22 ENCOUNTER — Encounter: Payer: Medicare (Managed Care) | Admitting: Speech Pathology

## 2022-08-24 ENCOUNTER — Ambulatory Visit: Payer: Medicare (Managed Care) | Admitting: Pain Medicine

## 2022-08-24 ENCOUNTER — Encounter: Payer: Medicare (Managed Care) | Admitting: Occupational Therapy

## 2022-08-24 ENCOUNTER — Encounter: Payer: Medicare (Managed Care) | Admitting: Speech Pathology

## 2022-08-24 IMAGING — DX DG WRIST COMPLETE 3+V*L*
4 series · 4 of 4 positions shown · non-contrast
Comparison: None.

CLINICAL DATA: Swelling, fall

EXAM:
LEFT WRIST - COMPLETE 3+ VIEW

[wrist ap]
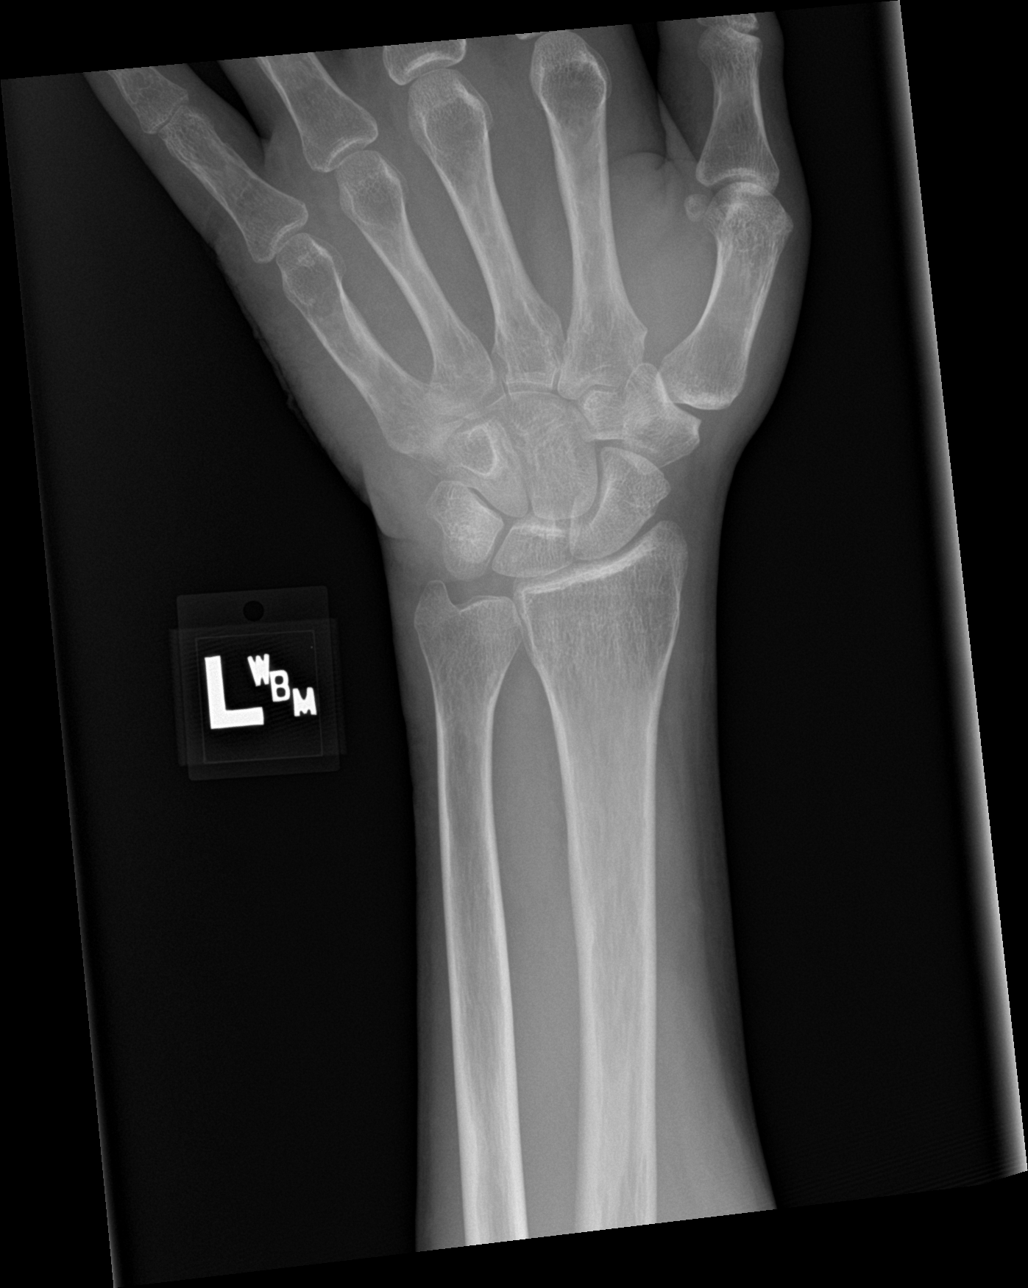

[wrist obl]
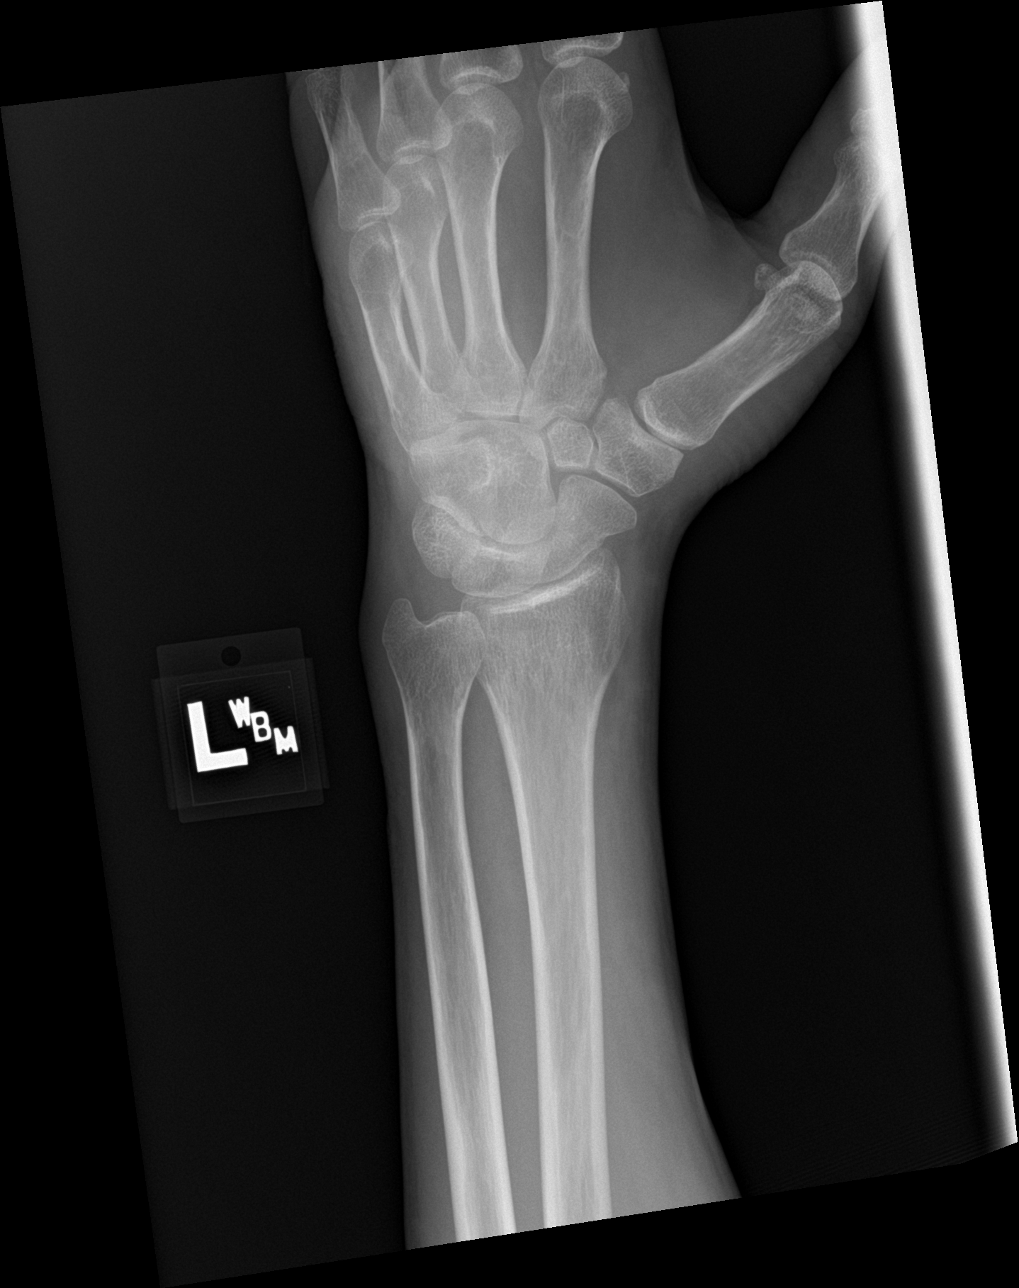

[wrist lat]
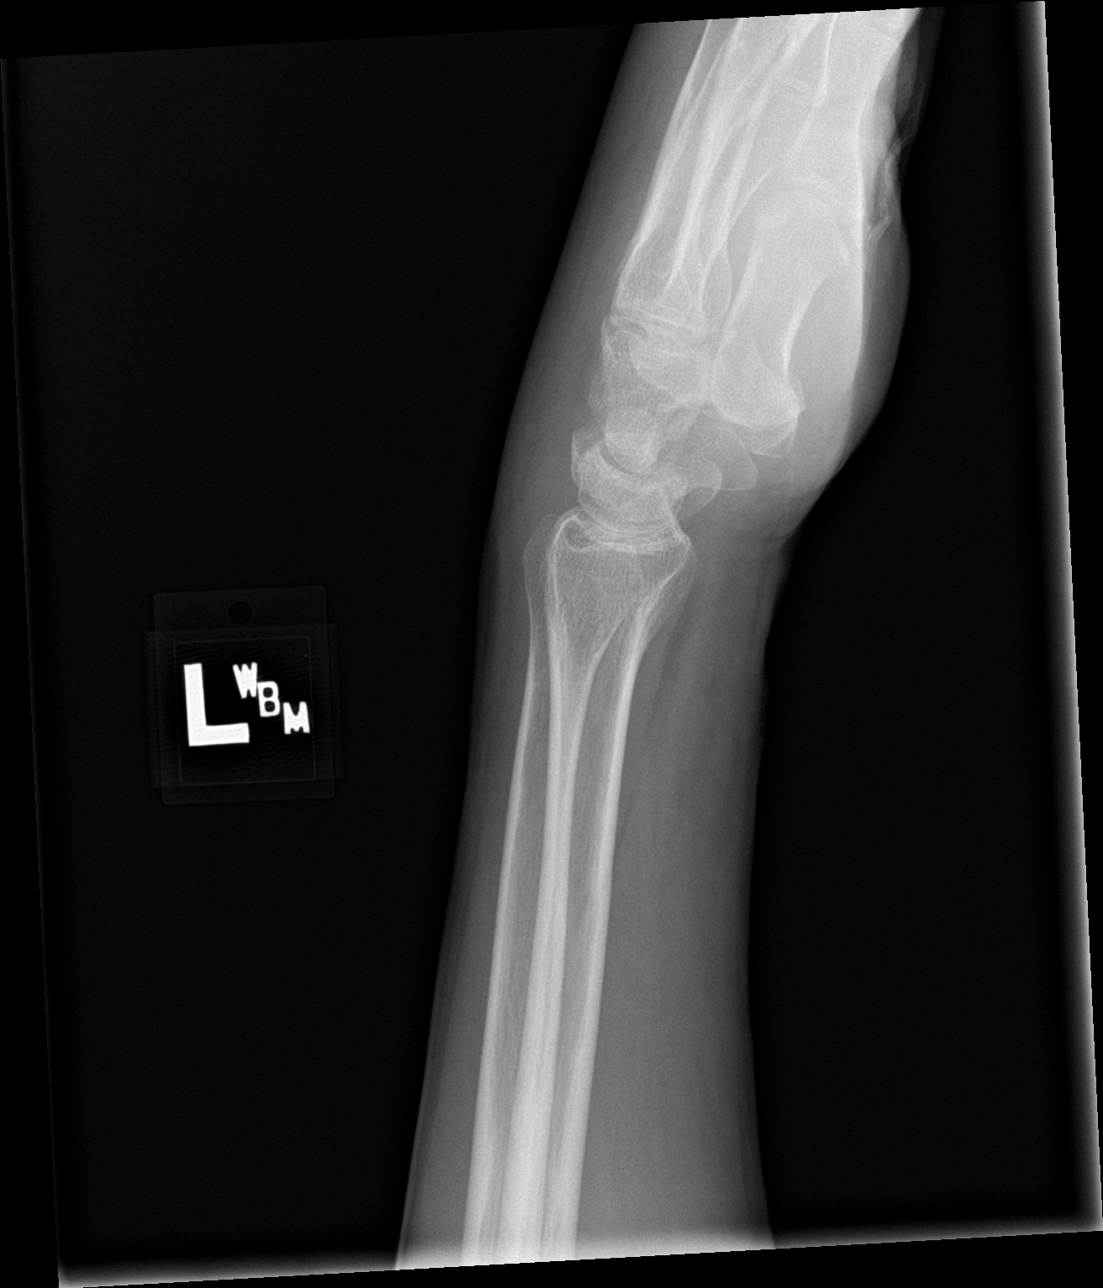

[wrist navicular]
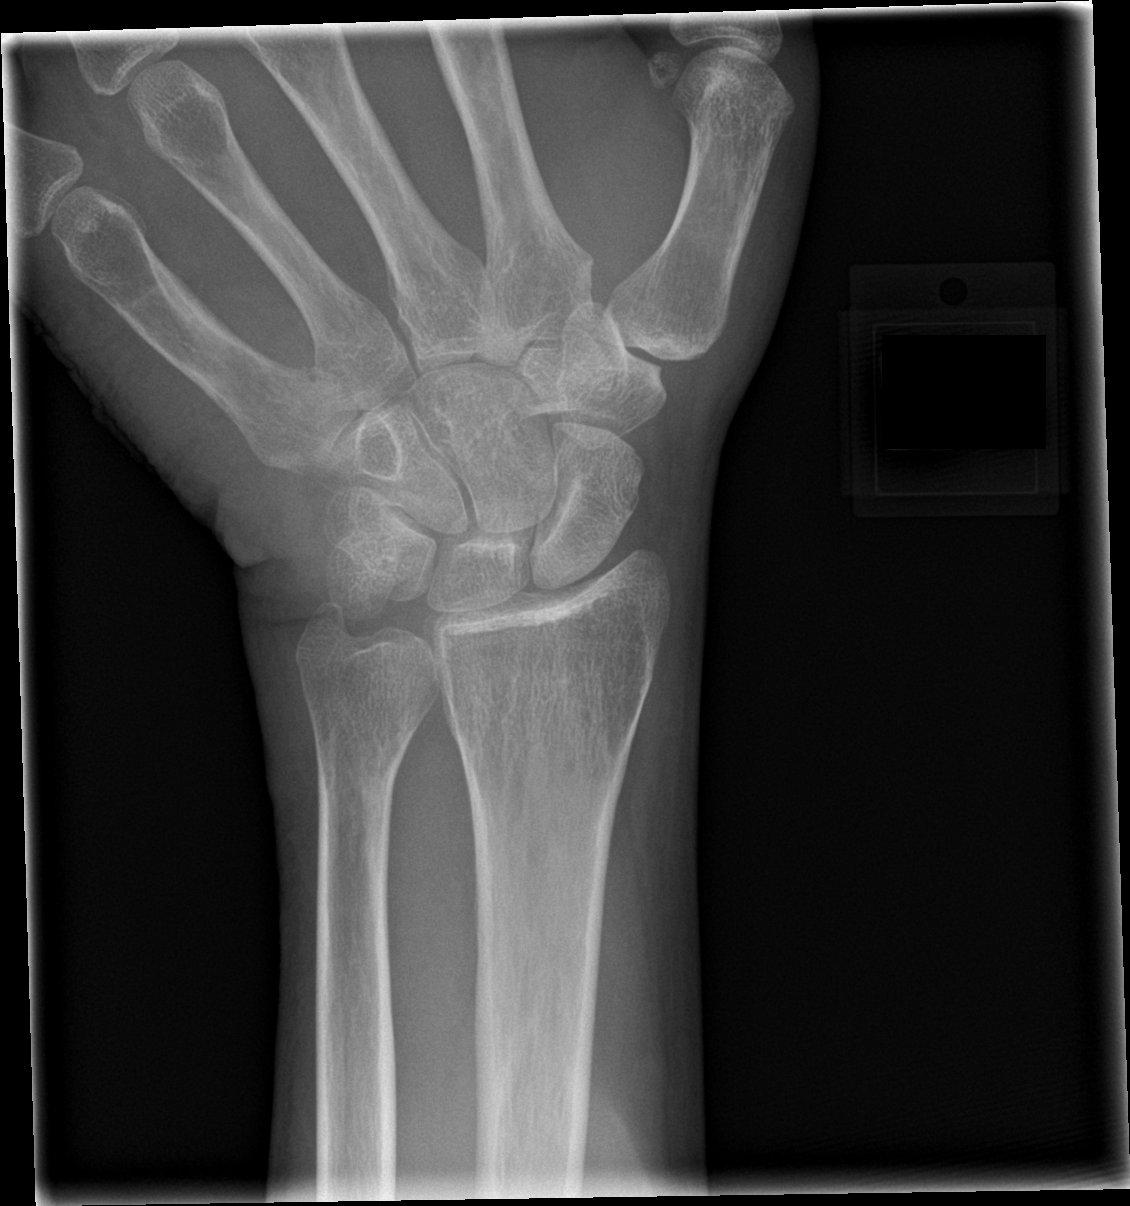

[4 of 4 positions shown; findings below may reference images not displayed]

FINDINGS: There is soft tissue swelling. There is no evidence of acute
fracture. There is no significant degenerative change.
IMPRESSION: Soft tissue swelling.  No acute osseous abnormality.

## 2022-08-29 ENCOUNTER — Encounter: Payer: Medicare (Managed Care) | Admitting: Speech Pathology

## 2022-08-29 ENCOUNTER — Encounter: Payer: Medicare (Managed Care) | Admitting: Occupational Therapy

## 2022-08-31 ENCOUNTER — Encounter: Payer: Medicare (Managed Care) | Admitting: Speech Pathology

## 2022-08-31 ENCOUNTER — Encounter: Payer: Medicare (Managed Care) | Admitting: Occupational Therapy

## 2022-09-05 ENCOUNTER — Encounter: Payer: Medicare (Managed Care) | Admitting: Speech Pathology

## 2022-09-05 ENCOUNTER — Encounter: Payer: Medicare (Managed Care) | Admitting: Occupational Therapy

## 2022-09-07 ENCOUNTER — Encounter: Payer: Medicare (Managed Care) | Admitting: Speech Pathology

## 2022-09-07 ENCOUNTER — Encounter: Payer: Medicare (Managed Care) | Admitting: Occupational Therapy

## 2022-09-12 ENCOUNTER — Encounter: Payer: Medicare (Managed Care) | Admitting: Occupational Therapy

## 2022-09-12 ENCOUNTER — Encounter: Payer: Medicare (Managed Care) | Admitting: Speech Pathology

## 2022-09-14 ENCOUNTER — Encounter: Payer: Medicare (Managed Care) | Admitting: Speech Pathology

## 2022-09-14 ENCOUNTER — Encounter: Payer: Medicare (Managed Care) | Admitting: Occupational Therapy

## 2022-09-15 ENCOUNTER — Ambulatory Visit: Payer: Medicare (Managed Care) | Admitting: Gastroenterology

## 2022-09-19 ENCOUNTER — Encounter: Payer: Medicare (Managed Care) | Admitting: Speech Pathology

## 2022-09-19 ENCOUNTER — Encounter: Payer: Medicare (Managed Care) | Admitting: Occupational Therapy

## 2022-09-21 ENCOUNTER — Encounter: Payer: Medicare (Managed Care) | Admitting: Speech Pathology

## 2022-09-21 ENCOUNTER — Encounter: Payer: Medicare (Managed Care) | Admitting: Occupational Therapy

## 2022-09-22 ENCOUNTER — Telehealth: Payer: Self-pay | Admitting: Family Medicine

## 2022-09-22 NOTE — Telephone Encounter (Signed)
Patient passed away September 30, 2022

## 2022-09-26 ENCOUNTER — Encounter: Payer: Medicare (Managed Care) | Admitting: Speech Pathology

## 2022-09-26 ENCOUNTER — Encounter: Payer: Medicare (Managed Care) | Admitting: Occupational Therapy

## 2022-09-28 ENCOUNTER — Encounter: Payer: Medicare (Managed Care) | Admitting: Occupational Therapy

## 2022-09-28 ENCOUNTER — Encounter: Payer: Medicare (Managed Care) | Admitting: Speech Pathology

## 2022-10-03 ENCOUNTER — Encounter: Payer: Medicare (Managed Care) | Admitting: Occupational Therapy

## 2022-10-03 ENCOUNTER — Encounter: Payer: Medicare (Managed Care) | Admitting: Speech Pathology

## 2022-10-05 ENCOUNTER — Encounter: Payer: Medicare (Managed Care) | Admitting: Occupational Therapy

## 2022-10-05 ENCOUNTER — Encounter: Payer: Medicare (Managed Care) | Admitting: Speech Pathology

## 2022-10-10 ENCOUNTER — Encounter: Payer: Medicare (Managed Care) | Admitting: Occupational Therapy

## 2022-10-10 ENCOUNTER — Encounter: Payer: Medicare (Managed Care) | Admitting: Speech Pathology

## 2022-10-12 ENCOUNTER — Encounter: Payer: Medicare (Managed Care) | Admitting: Occupational Therapy

## 2022-10-12 ENCOUNTER — Encounter: Payer: Medicare (Managed Care) | Admitting: Speech Pathology

## 2022-10-14 DEATH — deceased

## 2022-10-17 ENCOUNTER — Encounter: Payer: Medicare (Managed Care) | Admitting: Speech Pathology

## 2022-10-17 ENCOUNTER — Encounter: Payer: Medicare (Managed Care) | Admitting: Occupational Therapy

## 2022-10-19 ENCOUNTER — Encounter: Payer: Medicare (Managed Care) | Admitting: Speech Pathology

## 2022-10-19 ENCOUNTER — Encounter: Payer: Medicare (Managed Care) | Admitting: Occupational Therapy

## 2022-10-24 ENCOUNTER — Encounter: Payer: Medicare (Managed Care) | Admitting: Speech Pathology

## 2022-10-24 ENCOUNTER — Encounter: Payer: Medicare (Managed Care) | Admitting: Occupational Therapy

## 2022-10-26 ENCOUNTER — Encounter: Payer: Medicare (Managed Care) | Admitting: Speech Pathology

## 2022-10-26 ENCOUNTER — Encounter: Payer: Medicare (Managed Care) | Admitting: Occupational Therapy

## 2022-10-31 ENCOUNTER — Encounter: Payer: Medicare (Managed Care) | Admitting: Occupational Therapy

## 2022-10-31 ENCOUNTER — Encounter: Payer: Medicare (Managed Care) | Admitting: Speech Pathology

## 2022-11-02 ENCOUNTER — Encounter: Payer: Medicare (Managed Care) | Admitting: Speech Pathology

## 2022-11-02 ENCOUNTER — Encounter: Payer: Medicare (Managed Care) | Admitting: Occupational Therapy

## 2023-07-11 IMAGING — CR DG THORACIC SPINE 3V
1 series · 3 of 3 positions shown · non-contrast
Comparison: Thoracic spine x-ray 02/16/2021

CLINICAL DATA: Back pain

EXAM:
THORACIC SPINE - 3 VIEWS

[Series 1: dg thoracic spine w/swimmers · 0.14mm/px · 3 of 3 slices shown]
[im 1/3]
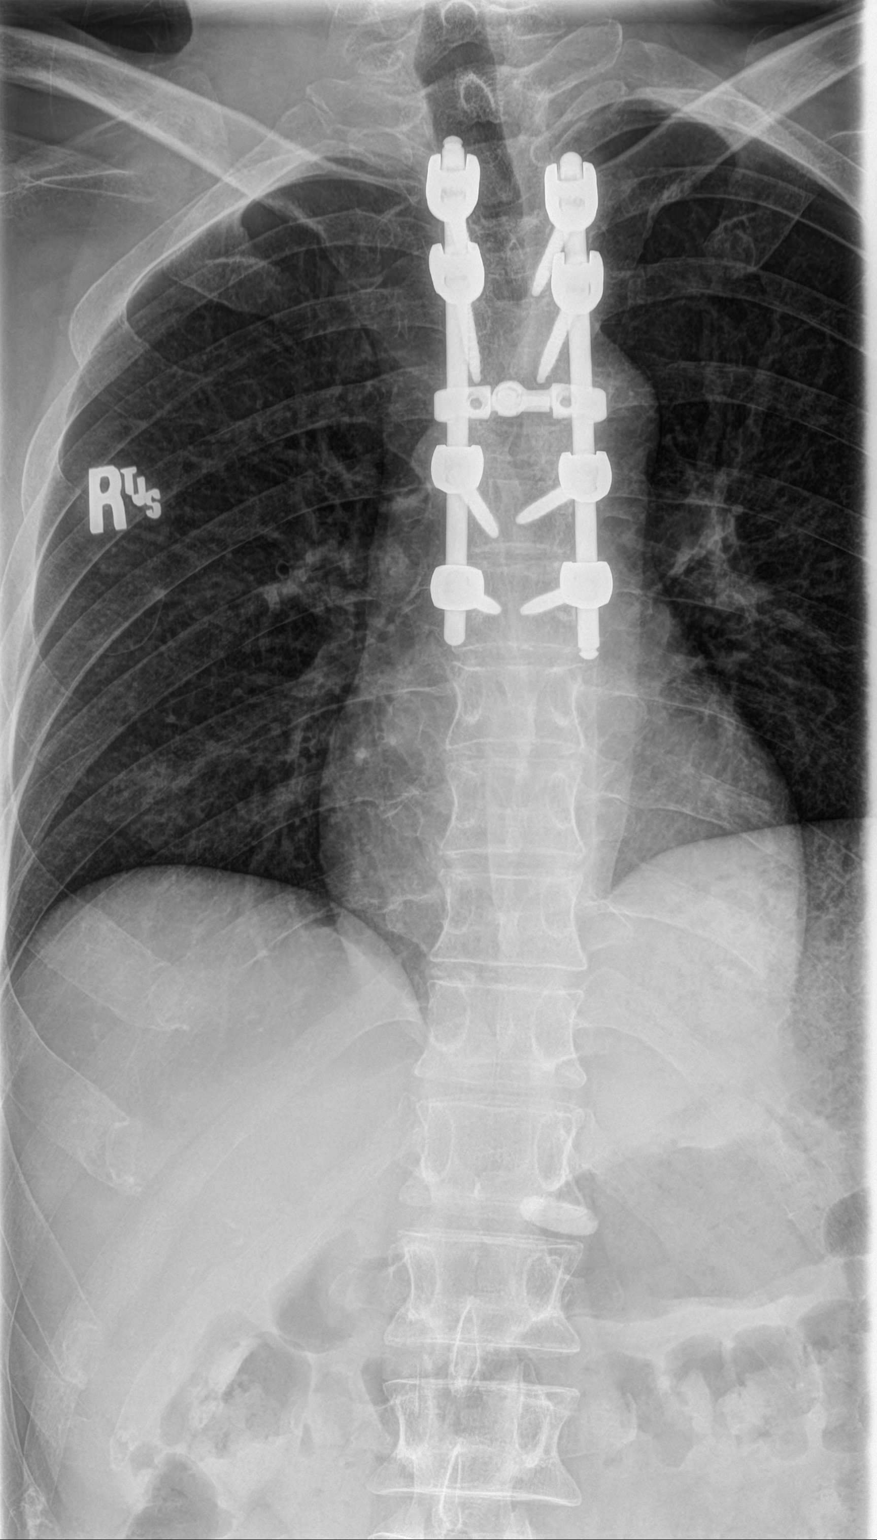
[im 2/3]
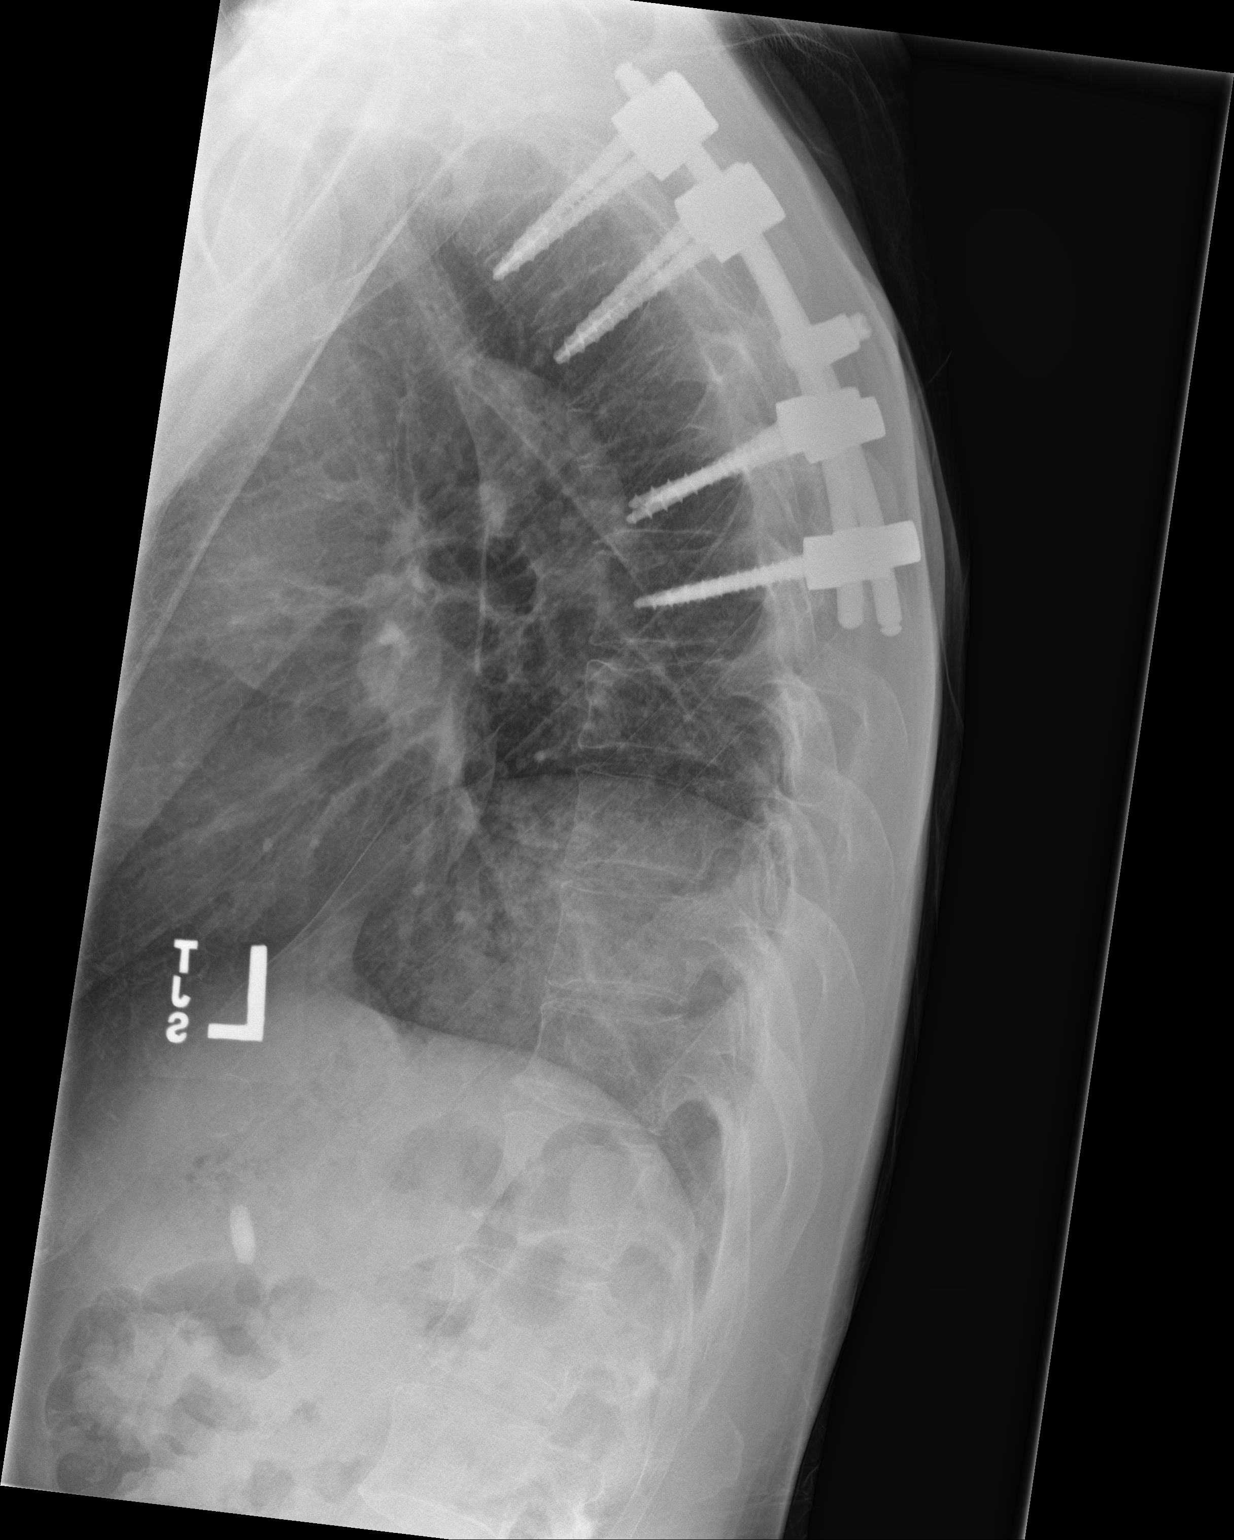
[im 3/3]
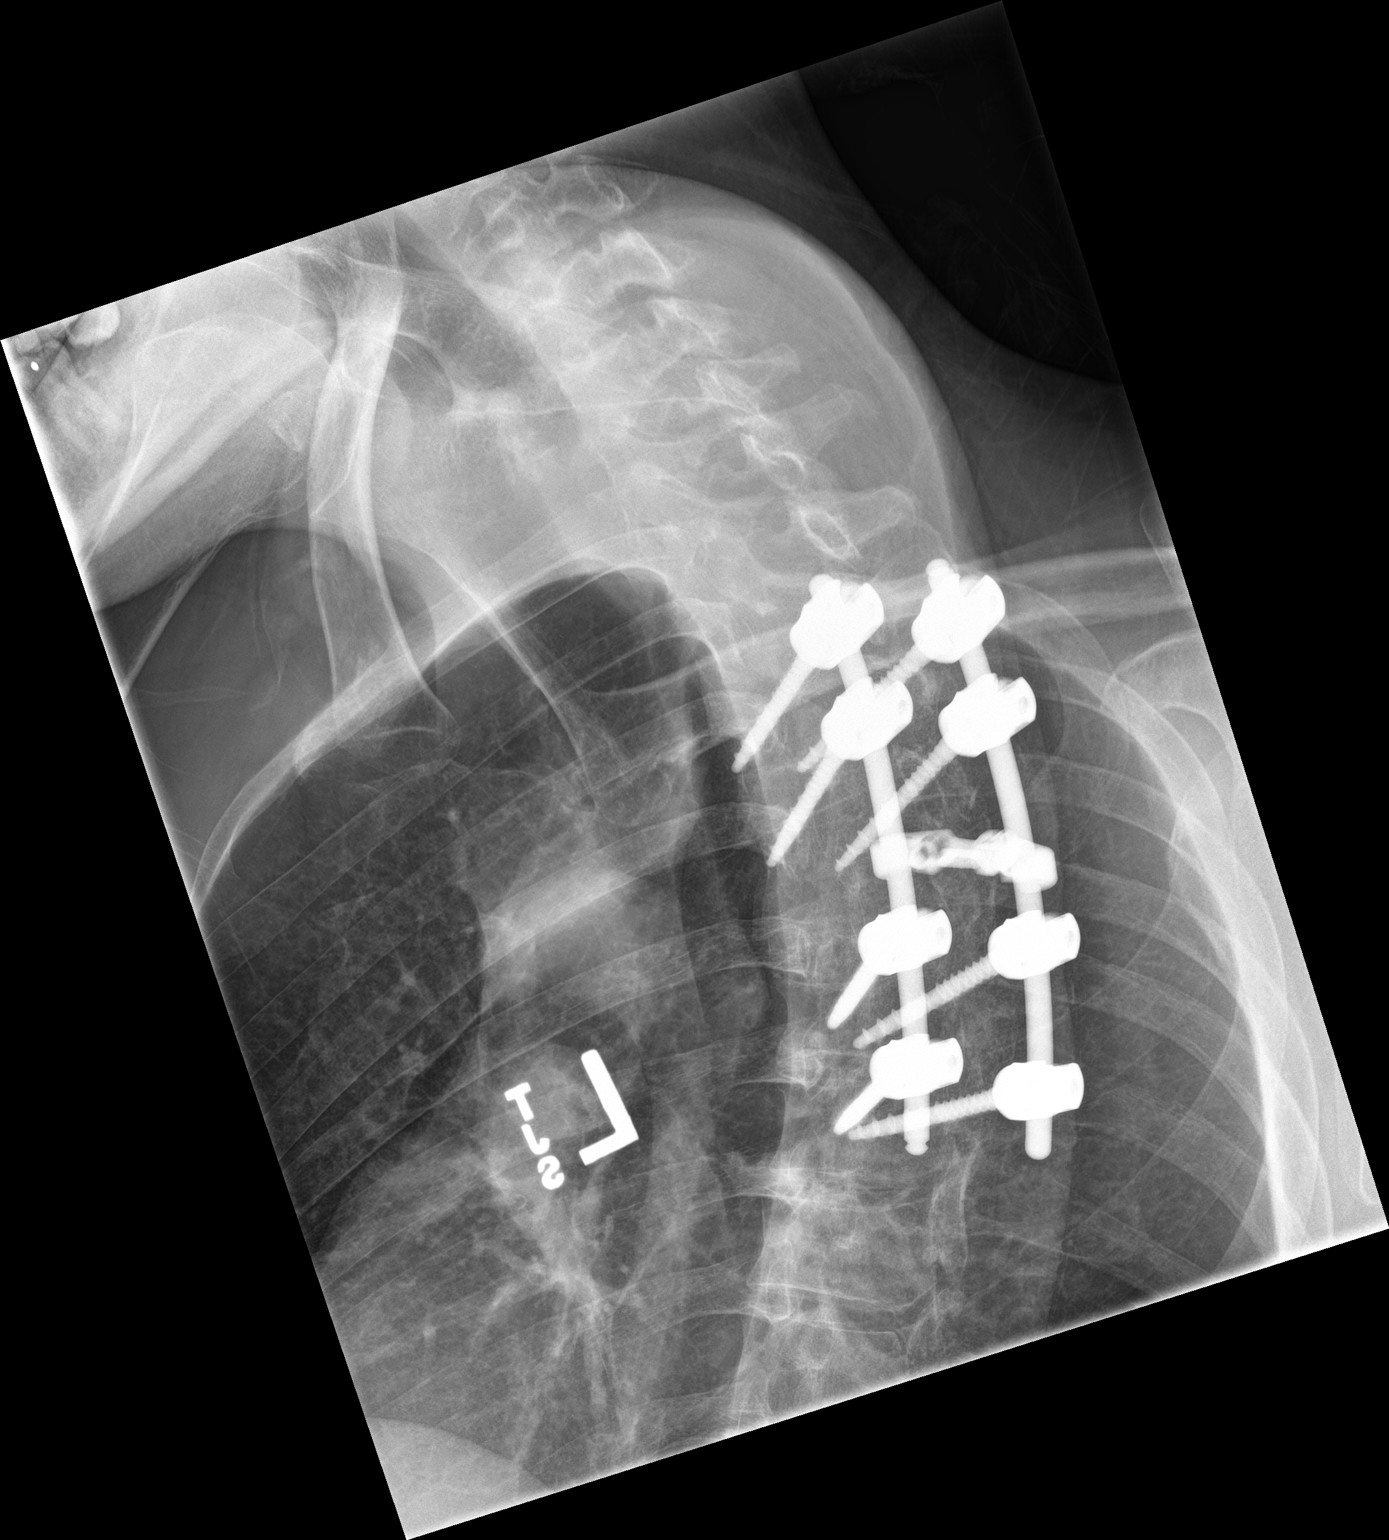

[3 of 3 positions shown; findings below may reference images not displayed]

FINDINGS: Chronic compression fracture deformity in a midthoracic vertebral
body with posterior fusion hardware above and below which is stable
and intact. No new fracture identified. No subluxation visualized.
IMPRESSION: 1. No acute fracture or malalignment identified.
2. Old compression fracture and posterior spinal hardware.

## 2023-07-11 IMAGING — CR DG LUMBAR SPINE COMPLETE 4+V
1 series · 5 of 5 positions shown · non-contrast
Comparison: None Available.

CLINICAL DATA: Back pain

EXAM:
LUMBAR SPINE - COMPLETE 4+ VIEW

[Series 1: dg lumbar spine complete 4 +v · 0.14mm/px · 5 of 5 slices shown]
[im 1/5]
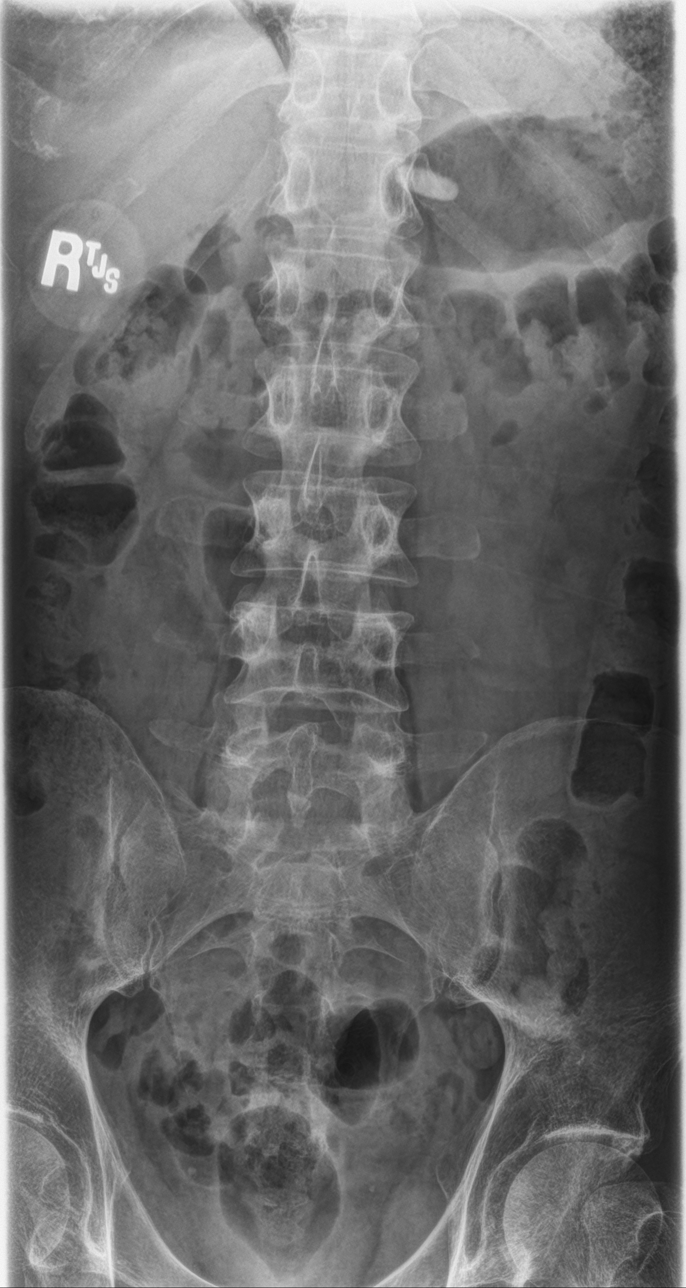
[im 2/5]
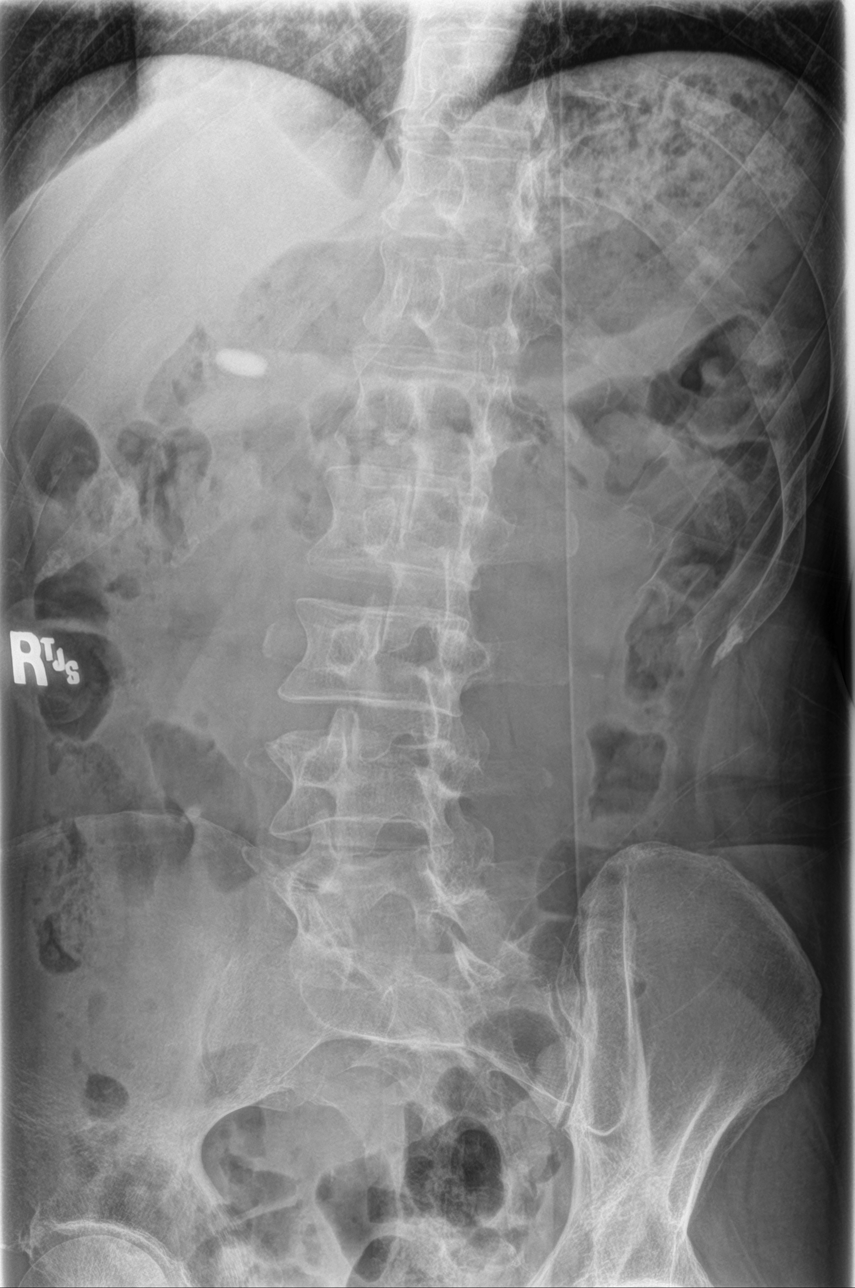
[im 3/5]
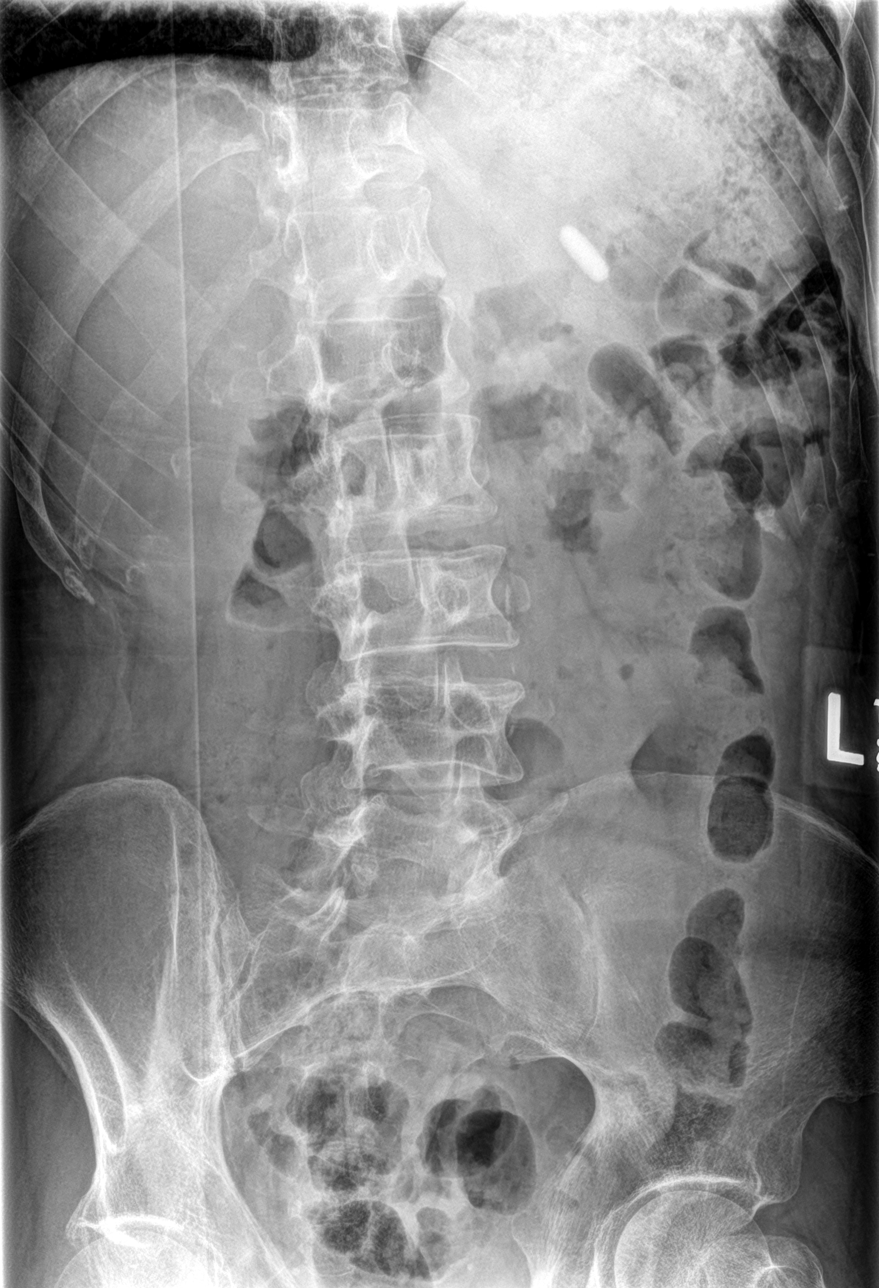
[im 4/5]
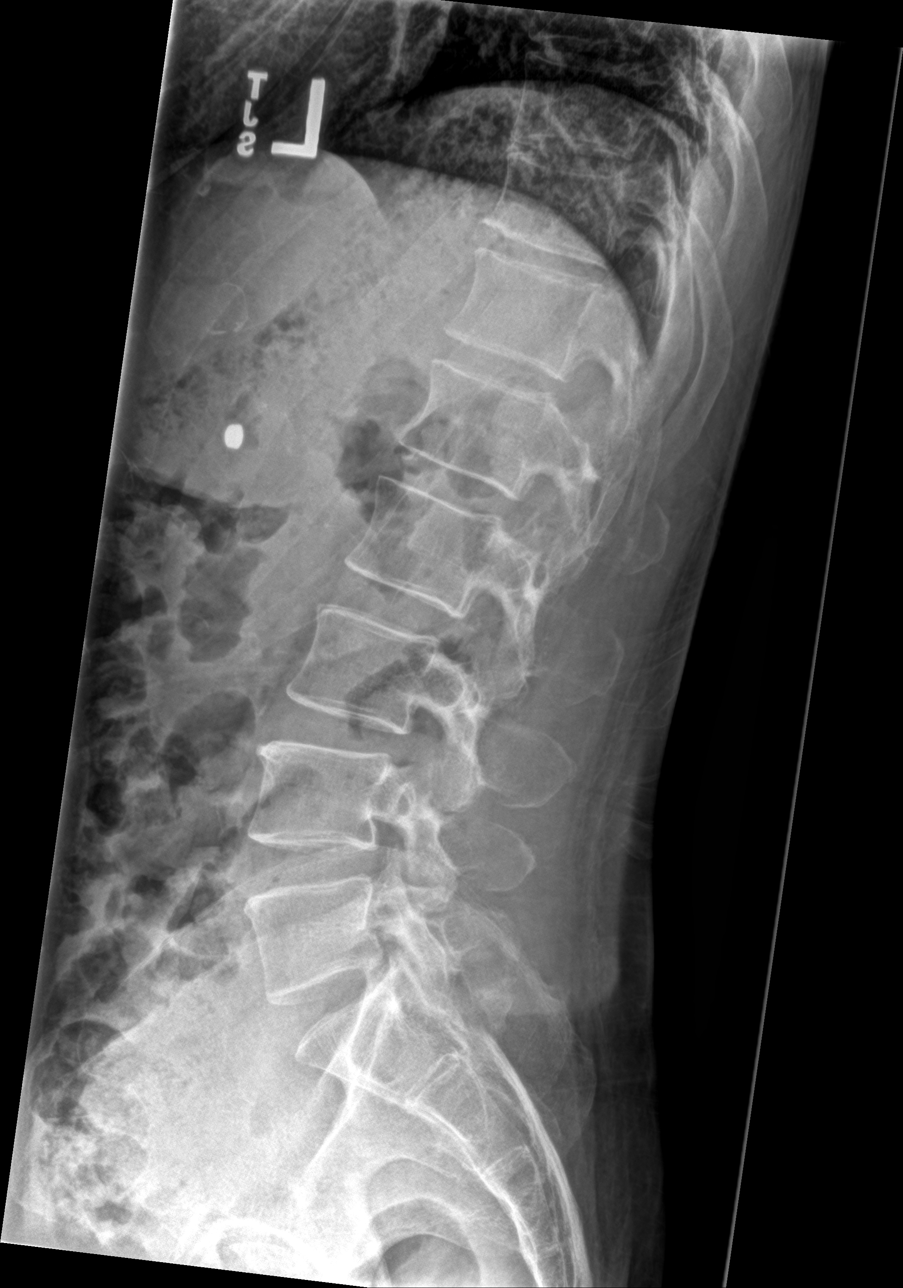
[im 5/5]
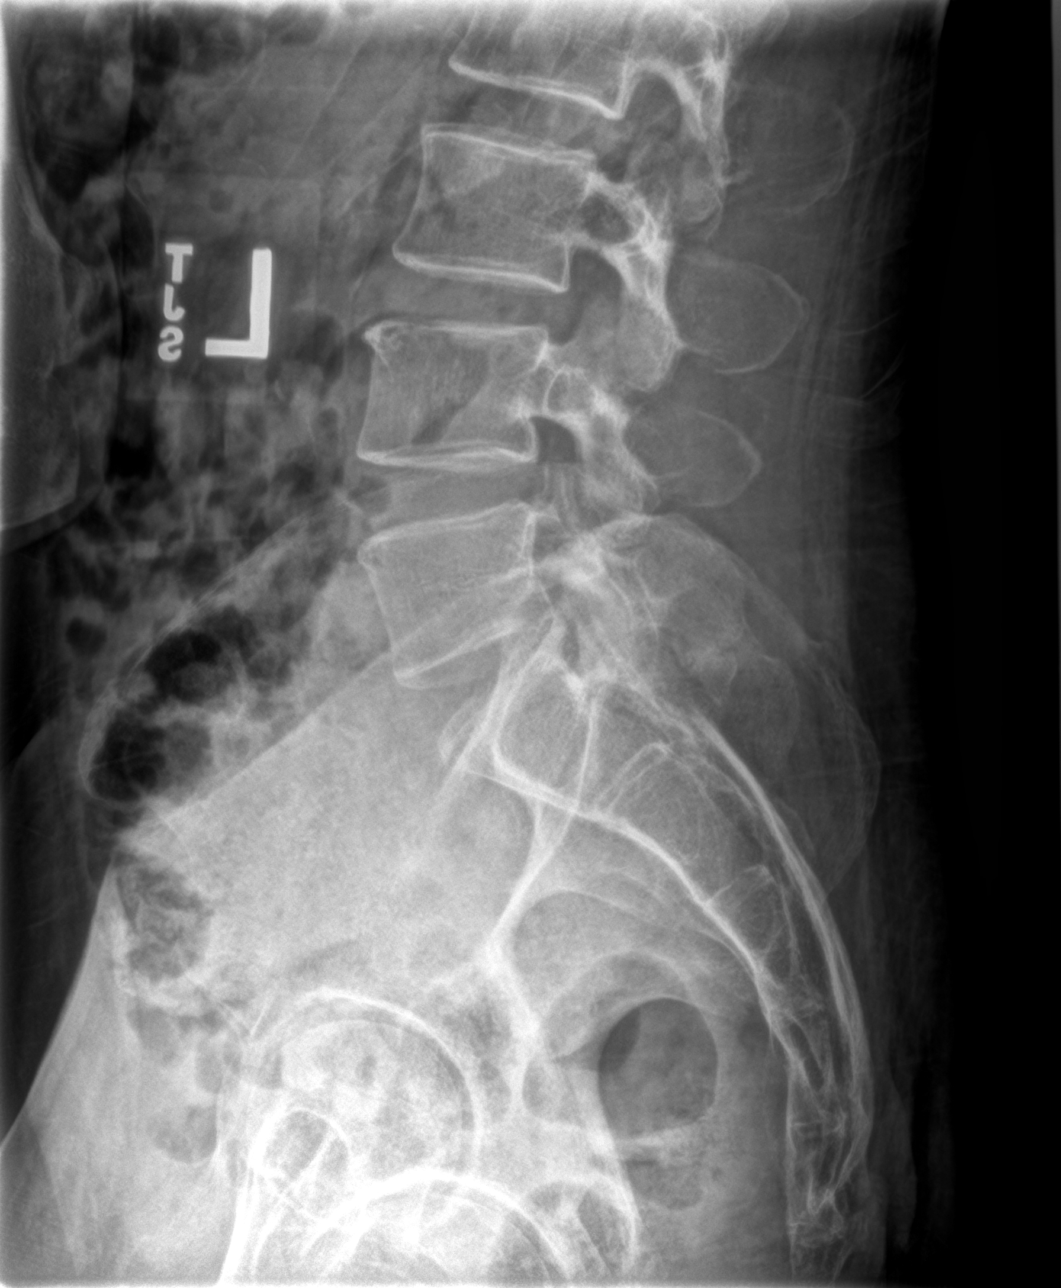

[5 of 5 positions shown; findings below may reference images not displayed]

FINDINGS: Lumbar vertebral body height are preserved without evidence of
fracture. Minimal grade 1 retrolisthesis of L2 on L3 and L3 on L4.
No spondylolysis identified. Intervertebral disc spaces are
preserved.
IMPRESSION: No acute fracture or malalignment identified.
